# Patient Record
Sex: Female | Born: 1935
Health system: Southern US, Community
[De-identification: ages and names within clinical notes are randomized; demographics above are authoritative.]

## PROBLEM LIST (undated history)

## (undated) DIAGNOSIS — N819 Female genital prolapse, unspecified: Secondary | ICD-10-CM

## (undated) DIAGNOSIS — R3915 Urgency of urination: Secondary | ICD-10-CM

## (undated) DIAGNOSIS — L509 Urticaria, unspecified: Secondary | ICD-10-CM

## (undated) DIAGNOSIS — H409 Unspecified glaucoma: Secondary | ICD-10-CM

## (undated) DIAGNOSIS — I10 Essential (primary) hypertension: Secondary | ICD-10-CM

## (undated) DIAGNOSIS — R3911 Hesitancy of micturition: Secondary | ICD-10-CM

## (undated) DIAGNOSIS — M1990 Unspecified osteoarthritis, unspecified site: Secondary | ICD-10-CM

## (undated) DIAGNOSIS — E785 Hyperlipidemia, unspecified: Secondary | ICD-10-CM

## (undated) HISTORY — DX: Urticaria, unspecified: L50.9

## (undated) HISTORY — PX: RETINAL DETACHMENT SURGERY: SHX105

---

## 1975-08-08 LAB — HM DIABETES EYE EXAM

## 1997-06-20 ENCOUNTER — Other Ambulatory Visit: Admission: RE | Admit: 1997-06-20 | Discharge: 1997-06-20 | Payer: Self-pay | Admitting: *Deleted

## 1998-06-14 ENCOUNTER — Other Ambulatory Visit: Admission: RE | Admit: 1998-06-14 | Discharge: 1998-06-14 | Payer: Self-pay | Admitting: *Deleted

## 1999-07-11 ENCOUNTER — Other Ambulatory Visit: Admission: RE | Admit: 1999-07-11 | Discharge: 1999-07-11 | Payer: Self-pay | Admitting: *Deleted

## 2000-09-23 ENCOUNTER — Other Ambulatory Visit: Admission: RE | Admit: 2000-09-23 | Discharge: 2000-09-23 | Payer: Self-pay | Admitting: *Deleted

## 2003-09-22 ENCOUNTER — Observation Stay (HOSPITAL_COMMUNITY): Admission: EM | Admit: 2003-09-22 | Discharge: 2003-09-23 | Payer: Self-pay | Admitting: Emergency Medicine

## 2004-01-19 ENCOUNTER — Other Ambulatory Visit: Admission: RE | Admit: 2004-01-19 | Discharge: 2004-01-19 | Payer: Self-pay | Admitting: *Deleted

## 2004-03-14 ENCOUNTER — Ambulatory Visit (HOSPITAL_COMMUNITY): Admission: RE | Admit: 2004-03-14 | Discharge: 2004-03-14 | Payer: Self-pay | Admitting: Gastroenterology

## 2006-04-03 ENCOUNTER — Other Ambulatory Visit: Admission: RE | Admit: 2006-04-03 | Discharge: 2006-04-03 | Payer: Self-pay | Admitting: *Deleted

## 2008-03-16 ENCOUNTER — Encounter: Admission: RE | Admit: 2008-03-16 | Discharge: 2008-03-16 | Payer: Self-pay | Admitting: Family Medicine

## 2008-06-08 ENCOUNTER — Other Ambulatory Visit: Admission: RE | Admit: 2008-06-08 | Discharge: 2008-06-08 | Payer: Self-pay | Admitting: Family Medicine

## 2010-06-29 NOTE — Op Note (Signed)
NAME:  JILLIENNE, EGNER             ACCOUNT NO.:  [id number]   MEDICAL RECORD NO.:  [id number]          PATIENT TYPE:  AMB   LOCATION:  ENDO                         FACILITY:  [facility]   PHYSICIAN:  Graylin Shiver, M.D.   DATE OF BIRTH:  Jul 03, 1930   DATE OF PROCEDURE:  03/14/2004  DATE OF DISCHARGE:                                 OPERATIVE REPORT   INDICATIONS:  Screening.   Informed consent was obtained after explanation of the risks of bleeding,  infection and perforation.   PREMEDICATIONS:  Fentanyl 75 mcg IV and Versed 7 mg IV.   DESCRIPTION OF PROCEDURE:  With the patient in the left lateral decubitus  position, a rectal exam was performed.  No masses were felt.  The Olympus  colonoscope was inserted into the rectum and advanced around the colon to  the cecum.  Cecal landmarks were identified.  The cecum and ascending colon  were normal.  The transverse colon was normal.  The descending colon and  sigmoid revealed diverticulosis.  The rectum was normal.  She tolerated the  procedure well without complications.   IMPRESSION:  Diverticulosis of the left colon.      SFG/MEDQ  D:  03/14/2004  T:  03/14/2004  Job:  [id number]   cc:   Al Decant. Janey Greaser, MD  [address]  [place]  [place] [address]  Fax: [phone]

## 2010-06-29 NOTE — H&P (Signed)
NAME:  Emily Robertson, Emily Robertson                       ACCOUNT NO.:  [id number]   MEDICAL RECORD NO.:  [id number]                   PATIENT TYPE:  EMS   LOCATION:  ED                                   FACILITY:  [facility] [address]   PHYSICIAN:  Isla Pence, M.D.             DATE OF BIRTH:  05/28/1930   DATE OF ADMISSION:  09/22/2003  DATE OF DISCHARGE:                                HISTORY & PHYSICAL   IDENTIFICATION STATEMENT:  This is a 77 year old Caucasian female.  Primary  care physician is Dr. Janey Greaser with [facility].   CHIEF COMPLAINT:  Right-sided facial swelling, also tongue and neck  swelling, that came on about 12:30 a.m. on the day of admission.   HISTORY OF PRESENT ILLNESS:  The patient notes that she had just happened to  wake up and felt a little funny on the right side of her face and felt that  it was swollen a little bit.  She took 50 mg of Benadryl orally but did not  notice much of a difference in the swelling, so she decided to come on to  the emergency room at [facility].  She denies any trouble breathing or  swallowing.  There have been no new pills in her regimen.  She has been on  Hyzaar for the past three years without any problems.  She had dinner  consisting of pork ribs with a new set of barbecue sauce that she has not  used in the past before.  She also had some tomatoes and okra.  She had also  eaten some nuts that day consisting of peanuts and almonds.  She has had one  other similar episode six years ago when she had facial swelling at that  time, but since then she has had episodes of lip swelling.  In the past she  used to watch diligently with triggers but had never been able to pick up  any particular triggers.  Six years ago she was also seen in the emergency  room for this.  She has never gone for allergy testing.  In the emergency  room she was given Solu-Medrol 125 mg IV, Benadryl 50 mg IV, and Pepcid 20  mg IV, with marked decrease in her swelling, but the ER  physician felt that  since she was still having some swelling he was uncomfortable with sending  her out of the emergency room.  The patient does note that the swelling is  markedly decreased since coming into the emergency room with the treatments  offered.  She noted previously the facial swelling was the size of a  grapefruit, she said.  She also had a glass of wine with dinner, but she  says she has used this brand of wine before without any problems in the  past.   She says the swelling is certainly markedly down, but she still kind of has  a funny feeling on the  lip.   ALLERGIES:  No known drug allergies.  No known other allergens.   CURRENT MEDICATIONS:  1. Evista 60 mg p.o. daily, which she has been on for a long time.  2. Fish oil.  3. Multivitamin p.o. daily.  4. Hyzaar 50 mg p.o. daily.  5. Glucosamine/chondroitin.  6. Calcium 500 mg p.o. daily.  7. Ecotrin 81 mg p.o. daily.   Once again, none of these medications are new.   PAST MEDICAL HISTORY:  1. Significant only for hypertension.  2. Osteoporosis.  3. Arthritis.  4. Mild hyperlipidemia that she is trying to control with diet and using     fish oil.   PAST SURGICAL HISTORY:  Only for surgery for a detached retina in the right  eye many years ago.   SOCIAL HISTORY:  She has been widowed since 78.  She has two kids.  She is  retired from the [employer].  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  She drinks an occasional glass of wine but  otherwise denies ever using tobacco.   FAMILY HISTORY:  Has a brother who had open heart surgery but no MI.  Oldest  brother died of prostate cancer in his 28s.  A brother with stroke, and it  is the same brother that had the open heart surgery.  There is lymphoma in  the sister.  Diabetes mellitus is negative.  Hypertension is negative.  There is no known colon, breast, ovarian cancer.   REVIEW OF SYSTEMS:  As per HPI.  She otherwise denies  chest pain, abdominal  pain, nausea or vomiting, melena, hematochezia.  She denies any frequency of  urination or dysuria.  She is just a little thirsty since coming into the  emergency room and with all of her current episodes.   PHYSICAL EXAMINATION:  VITAL SIGNS:  She is afebrile.  Blood pressure is  151/81, pulse of 79, respiratory rate 18.  Saturations 100% on room air.  GENERAL:  She is in no apparent distress.  HEENT:  There is a mild amount of right-sided facial swelling in the  mandibular area and going down into the neck.  Oropharynx:  I do not see any  posterior pharynx edema.  Nasal mucosa is just minimally boggy.  NECK:  There is no stridor.  CHEST:  Her lungs are actually clear to auscultation without any crackles or  wheezes.  She is moving good air in all of her lung fields.  CARDIAC:  Regular rate and rhythm.  ABDOMEN:  Bowel sounds are normal, soft and nontender, no organomegaly.  EXTREMITIES:  There is no pretibial edema.  NEUROLOGIC:  There is no gross neurologic deficit.   No lab work was done.   ASSESSMENT AND PLAN:  1. Angioedema.  I doubt very strongly this is related to Hyzaar; in fact, I     am almost certain it is not, although it is always possible it could     create a problem, but I really do believe this has more to do with     dietary intake as a trigger.  We will go ahead and admit her for 24-hour     observation just because she still has some of the swelling and has this     funny feeling on the tongue, but I expect her to go home fairly soon.  We     will do oral prednisone 60 mg x1 at 8 a.m.  and then a tapering dose for a     couple of days.  Will continue the Benadryl and Pepcid also.  I have     recommended allergy testing as an outpatient to see the potential     allergen that is causing her to have such a reaction.  I have also     recommended that she have an EpiPen with her at all times in case the    reaction gets to be more severe.  2. In  regard to a history of hypertension, we will continue the Cozaar.  3. In regard to the of osteoporosis, will continue the Evista.  The rest of     the medications are not so emergent for her to be on during this stay     since it is going to be a very short stay, and she will just continue     those at home, effectively putting her back on her aspirin.                                               Isla Pence, M.D.    RRV/MEDQ  D:  09/22/2003  T:  09/22/2003  Job:  [id number]   cc:   Dr. Jyl Heinz [facility]

## 2010-07-10 ENCOUNTER — Other Ambulatory Visit: Payer: Self-pay | Admitting: Nurse Practitioner

## 2010-07-10 ENCOUNTER — Other Ambulatory Visit (HOSPITAL_COMMUNITY)
Admission: RE | Admit: 2010-07-10 | Discharge: 2010-07-10 | Disposition: A | Discharge status: Home / Self Care | Payer: Medicare Other | Source: Ambulatory Visit | Attending: Obstetrics and Gynecology | Admitting: Obstetrics and Gynecology

## 2010-07-10 DIAGNOSIS — Z124 Encounter for screening for malignant neoplasm of cervix: Secondary | ICD-10-CM | POA: Insufficient documentation

## 2010-07-10 DIAGNOSIS — Z1159 Encounter for screening for other viral diseases: Secondary | ICD-10-CM | POA: Insufficient documentation

## 2012-07-01 ENCOUNTER — Other Ambulatory Visit: Payer: Self-pay | Admitting: Urology

## 2012-08-24 ENCOUNTER — Encounter (HOSPITAL_BASED_OUTPATIENT_CLINIC_OR_DEPARTMENT_OTHER): Payer: Self-pay | Admitting: *Deleted

## 2012-08-24 NOTE — Progress Notes (Addendum)
NPO AFTER MN. ARRIVES AT 0715. NEEDS ISTAT. CURRENT EKG AND LOV NOTE TO BE FAXED FROM DR Verdis Prime. REVEWED [facility] GUIDELINES , WILL BRING MES. WILL DO HIBICLENS AM OF SURG.

## 2012-08-30 NOTE — Anesthesia Preprocedure Evaluation (Addendum)
Anesthesia Evaluation  Patient identified by MRN, date of birth, ID band Patient awake    Reviewed: Allergy & Precautions, H&P , NPO status , Patient's Chart, lab work & pertinent test results, reviewed documented beta blocker date and time   Airway Mallampati: II TM Distance: >3 FB Neck ROM: full    Dental no notable dental hx. (+) Teeth Intact and Dental Advisory Given   Pulmonary neg pulmonary ROS,  breath sounds clear to auscultation  Pulmonary exam normal       Cardiovascular hypertension, Pt. on home beta blockers Rhythm:regular Rate:Normal     Neuro/Psych glaucoma negative neurological ROS  negative psych ROS   GI/Hepatic negative GI ROS, Neg liver ROS,   Endo/Other  negative endocrine ROS  Renal/GU negative Renal ROS  negative genitourinary   Musculoskeletal   Abdominal   Peds  Hematology negative hematology ROS (+)   Anesthesia Other Findings   Reproductive/Obstetrics negative OB ROS                          Anesthesia Physical Anesthesia Plan  ASA: II  Anesthesia Plan: General   Post-op Pain Management:    Induction: Intravenous  Airway Management Planned: LMA  Additional Equipment:   Intra-op Plan:   Post-operative Plan:   Informed Consent: I have reviewed the patients History and Physical, chart, labs and discussed the procedure including the risks, benefits and alternatives for the proposed anesthesia with the patient or authorized representative who has indicated his/her understanding and acceptance.   Dental Advisory Given  Plan Discussed with: CRNA and Surgeon  Anesthesia Plan Comments:         Anesthesia Quick Evaluation

## 2012-08-31 ENCOUNTER — Encounter (HOSPITAL_BASED_OUTPATIENT_CLINIC_OR_DEPARTMENT_OTHER)
Admission: RE | Disposition: A | Discharge status: Home / Self Care | Payer: Self-pay | Source: Ambulatory Visit | Attending: Urology

## 2012-08-31 ENCOUNTER — Encounter (HOSPITAL_BASED_OUTPATIENT_CLINIC_OR_DEPARTMENT_OTHER): Payer: Self-pay | Admitting: Anesthesiology

## 2012-08-31 ENCOUNTER — Ambulatory Visit (HOSPITAL_BASED_OUTPATIENT_CLINIC_OR_DEPARTMENT_OTHER): Payer: Medicare Other | Admitting: Anesthesiology

## 2012-08-31 ENCOUNTER — Ambulatory Visit (HOSPITAL_BASED_OUTPATIENT_CLINIC_OR_DEPARTMENT_OTHER)
Admission: RE | Admit: 2012-08-31 | Discharge: 2012-09-01 | Disposition: A | Discharge status: Home / Self Care | Payer: Medicare Other | Source: Ambulatory Visit | Attending: Urology | Admitting: Urology

## 2012-08-31 DIAGNOSIS — I10 Essential (primary) hypertension: Secondary | ICD-10-CM | POA: Insufficient documentation

## 2012-08-31 DIAGNOSIS — E7800 Pure hypercholesterolemia, unspecified: Secondary | ICD-10-CM | POA: Insufficient documentation

## 2012-08-31 DIAGNOSIS — N952 Postmenopausal atrophic vaginitis: Secondary | ICD-10-CM | POA: Insufficient documentation

## 2012-08-31 DIAGNOSIS — Z79899 Other long term (current) drug therapy: Secondary | ICD-10-CM | POA: Insufficient documentation

## 2012-08-31 DIAGNOSIS — S3140XA Unspecified open wound of vagina and vulva, initial encounter: Secondary | ICD-10-CM | POA: Insufficient documentation

## 2012-08-31 DIAGNOSIS — Y838 Other surgical procedures as the cause of abnormal reaction of the patient, or of later complication, without mention of misadventure at the time of the procedure: Secondary | ICD-10-CM | POA: Insufficient documentation

## 2012-08-31 DIAGNOSIS — H409 Unspecified glaucoma: Secondary | ICD-10-CM | POA: Insufficient documentation

## 2012-08-31 DIAGNOSIS — N8111 Cystocele, midline: Secondary | ICD-10-CM | POA: Insufficient documentation

## 2012-08-31 DIAGNOSIS — N3941 Urge incontinence: Secondary | ICD-10-CM | POA: Insufficient documentation

## 2012-08-31 DIAGNOSIS — N8110 Cystocele, unspecified: Secondary | ICD-10-CM | POA: Insufficient documentation

## 2012-08-31 DIAGNOSIS — Z7982 Long term (current) use of aspirin: Secondary | ICD-10-CM | POA: Insufficient documentation

## 2012-08-31 HISTORY — DX: Female genital prolapse, unspecified: N81.9

## 2012-08-31 HISTORY — DX: Unspecified glaucoma: H40.9

## 2012-08-31 HISTORY — DX: Hesitancy of micturition: R39.11

## 2012-08-31 HISTORY — PX: PUBOVAGINAL SLING: SHX1035

## 2012-08-31 HISTORY — DX: Unspecified osteoarthritis, unspecified site: M19.90

## 2012-08-31 HISTORY — DX: Urgency of urination: R39.15

## 2012-08-31 HISTORY — DX: Essential (primary) hypertension: I10

## 2012-08-31 HISTORY — DX: Hyperlipidemia, unspecified: E78.5

## 2012-08-31 LAB — POCT I-STAT 4, (NA,K, GLUC, HGB,HCT): Hemoglobin: 15.3 g/dL — ABNORMAL HIGH (ref 12.0–15.0)

## 2012-08-31 SURGERY — CREATION, PUBOVAGINAL SLING
Anesthesia: General | Site: Bladder | Wound class: Clean Contaminated

## 2012-08-31 MED ORDER — METRONIDAZOLE 0.75 % VA GEL
VAGINAL | Status: DC | PRN
  Administered 2012-08-31: 1 via VAGINAL

## 2012-08-31 MED ORDER — LIDOCAINE HCL (CARDIAC) 20 MG/ML IV SOLN
INTRAVENOUS | Status: DC | PRN
  Administered 2012-08-31: 50 mg via INTRAVENOUS

## 2012-08-31 MED ORDER — SODIUM CHLORIDE 0.45 % IV SOLN
INTRAVENOUS | Status: DC
  Administered 2012-08-31 – 2012-09-01 (×2): via INTRAVENOUS
  Filled 2012-08-31: qty 1000

## 2012-08-31 MED ORDER — ATENOLOL-CHLORTHALIDONE 50-25 MG PO TABS: 0.5000 | ORAL_TABLET | Freq: Every day | ORAL | Status: DC

## 2012-08-31 MED ORDER — SENNOSIDES-DOCUSATE SODIUM 8.6-50 MG PO TABS
2.0000 | ORAL_TABLET | Freq: Every day | ORAL | Status: DC
  Administered 2012-08-31: 2 via ORAL
  Filled 2012-08-31: qty 2

## 2012-08-31 MED ORDER — BUPIVACAINE-EPINEPHRINE 0.5% -1:200000 IJ SOLN
INTRAMUSCULAR | Status: DC | PRN
  Administered 2012-08-31: 30 mL

## 2012-08-31 MED ORDER — ACETAMINOPHEN 10 MG/ML IV SOLN
INTRAVENOUS | Status: DC | PRN
  Administered 2012-08-31: 1000 mg via INTRAVENOUS

## 2012-08-31 MED ORDER — CEFAZOLIN SODIUM-DEXTROSE 2-3 GM-% IV SOLR
2.0000 g | INTRAVENOUS | Status: AC
  Administered 2012-08-31: 2 g via INTRAVENOUS
  Filled 2012-08-31: qty 50

## 2012-08-31 MED ORDER — LORATADINE 10 MG PO CAPS: 1.0000 | ORAL_CAPSULE | Freq: Every day | ORAL | Status: DC

## 2012-08-31 MED ORDER — DEXAMETHASONE SODIUM PHOSPHATE 4 MG/ML IJ SOLN
INTRAMUSCULAR | Status: DC | PRN
  Administered 2012-08-31: 10 mg via INTRAVENOUS

## 2012-08-31 MED ORDER — OMEGA-3 FATTY ACIDS 1000 MG PO CAPS: 1.0000 g | ORAL_CAPSULE | Freq: Every day | ORAL | Status: DC

## 2012-08-31 MED ORDER — TRAMADOL-ACETAMINOPHEN 37.5-325 MG PO TABS: 1.0000 | ORAL_TABLET | Freq: Four times a day (QID) | ORAL | Status: DC | PRN

## 2012-08-31 MED ORDER — VITAMIN D3 25 MCG (1000 UT) PO CAPS: 1.0000 | ORAL_CAPSULE | Freq: Every day | ORAL | Status: DC

## 2012-08-31 MED ORDER — HYDROMORPHONE HCL PF 1 MG/ML IJ SOLN
0.5000 mg | INTRAMUSCULAR | Status: DC | PRN
Start: 2012-08-31 — End: 2012-09-01
  Filled 2012-08-31: qty 1

## 2012-08-31 MED ORDER — FENTANYL CITRATE 0.05 MG/ML IJ SOLN
INTRAMUSCULAR | Status: DC | PRN
  Administered 2012-08-31 (×2): 50 ug via INTRAVENOUS
  Administered 2012-08-31: 25 ug via INTRAVENOUS

## 2012-08-31 MED ORDER — TIMOLOL HEMIHYDRATE 0.25 % OP SOLN: 1.0000 [drp] | Freq: Two times a day (BID) | OPHTHALMIC | Status: DC

## 2012-08-31 MED ORDER — EPHEDRINE SULFATE 50 MG/ML IJ SOLN
INTRAMUSCULAR | Status: DC | PRN
  Administered 2012-08-31: 15 mg via INTRAVENOUS
  Administered 2012-08-31: 5 mg via INTRAVENOUS
  Administered 2012-08-31 (×2): 10 mg via INTRAVENOUS

## 2012-08-31 MED ORDER — BELLADONNA ALKALOIDS-OPIUM 16.2-60 MG RE SUPP
RECTAL | Status: DC | PRN
Start: 2012-08-31 — End: 2012-08-31
  Administered 2012-08-31: 1 via RECTAL

## 2012-08-31 MED ORDER — CALCIUM CARB-CHOLECALCIFEROL 500-400 MG-UNIT PO TABS: ORAL_TABLET | ORAL | Status: DC

## 2012-08-31 MED ORDER — CIPROFLOXACIN HCL 500 MG PO TABS: 500.0000 mg | ORAL_TABLET | Freq: Two times a day (BID) | ORAL | Status: DC

## 2012-08-31 MED ORDER — INDIGOTINDISULFONATE SODIUM 8 MG/ML IJ SOLN
INTRAMUSCULAR | Status: DC | PRN
  Administered 2012-08-31 (×2): 5 mL via INTRAVENOUS

## 2012-08-31 MED ORDER — DIPHENHYDRAMINE HCL 12.5 MG/5ML PO ELIX
12.5000 mg | ORAL_SOLUTION | Freq: Four times a day (QID) | ORAL | Status: DC | PRN
  Filled 2012-08-31: qty 5

## 2012-08-31 MED ORDER — SODIUM CHLORIDE 0.9 % IJ SOLN
INTRAMUSCULAR | Status: DC | PRN
  Administered 2012-08-31: 50 mL via INTRAVENOUS

## 2012-08-31 MED ORDER — LACTATED RINGERS IV SOLN
INTRAVENOUS | Status: DC
  Filled 2012-08-31: qty 1000

## 2012-08-31 MED ORDER — CIPROFLOXACIN HCL 500 MG PO TABS
500.0000 mg | ORAL_TABLET | Freq: Two times a day (BID) | ORAL | Status: DC
  Administered 2012-08-31 – 2012-09-01 (×2): 500 mg via ORAL
  Filled 2012-08-31: qty 1

## 2012-08-31 MED ORDER — GLUCOSAMINE-CHONDROITIN 500-400 MG PO TABS: 1.0000 | ORAL_TABLET | Freq: Every day | ORAL | Status: DC

## 2012-08-31 MED ORDER — ONDANSETRON HCL 4 MG/2ML IJ SOLN
INTRAMUSCULAR | Status: DC | PRN
  Administered 2012-08-31: 4 mg via INTRAVENOUS

## 2012-08-31 MED ORDER — LACTATED RINGERS IV SOLN
INTRAVENOUS | Status: DC
  Administered 2012-08-31 (×2): via INTRAVENOUS
  Filled 2012-08-31: qty 1000

## 2012-08-31 MED ORDER — HYDROCODONE-ACETAMINOPHEN 5-325 MG PO TABS
1.0000 | ORAL_TABLET | ORAL | Status: DC | PRN
  Filled 2012-08-31: qty 2

## 2012-08-31 MED ORDER — BISACODYL 10 MG RE SUPP
10.0000 mg | Freq: Every day | RECTAL | Status: DC | PRN
  Filled 2012-08-31: qty 1

## 2012-08-31 MED ORDER — GLYCOPYRROLATE 0.2 MG/ML IJ SOLN
INTRAMUSCULAR | Status: DC | PRN
  Administered 2012-08-31: 0.2 mg via INTRAVENOUS

## 2012-08-31 MED ORDER — ZOLPIDEM TARTRATE 5 MG PO TABS
5.0000 mg | ORAL_TABLET | Freq: Every evening | ORAL | Status: DC | PRN
  Filled 2012-08-31: qty 1

## 2012-08-31 MED ORDER — KETOROLAC TROMETHAMINE 30 MG/ML IJ SOLN
INTRAMUSCULAR | Status: DC | PRN
  Administered 2012-08-31: 15 mg via INTRAVENOUS

## 2012-08-31 MED ORDER — CHLORHEXIDINE GLUCONATE 4 % EX LIQD
Freq: Once | CUTANEOUS | Status: DC
  Filled 2012-08-31: qty 15

## 2012-08-31 MED ORDER — FENTANYL CITRATE 0.05 MG/ML IJ SOLN
25.0000 ug | INTRAMUSCULAR | Status: DC | PRN
  Filled 2012-08-31: qty 1

## 2012-08-31 MED ORDER — CEFAZOLIN SODIUM 1-5 GM-% IV SOLN
1.0000 g | INTRAVENOUS | Status: DC
  Filled 2012-08-31: qty 50

## 2012-08-31 MED ORDER — ONE-DAILY MULTI VITAMINS PO TABS: 1.0000 | ORAL_TABLET | Freq: Every day | ORAL | Status: DC

## 2012-08-31 MED ORDER — SODIUM CHLORIDE 0.9 % IR SOLN
Status: DC | PRN
  Administered 2012-08-31: 10:00:00

## 2012-08-31 MED ORDER — PROPOFOL 10 MG/ML IV BOLUS
INTRAVENOUS | Status: DC | PRN
  Administered 2012-08-31: 20 mg via INTRAVENOUS
  Administered 2012-08-31: 130 mg via INTRAVENOUS

## 2012-08-31 MED ORDER — DIPHENHYDRAMINE HCL 50 MG/ML IJ SOLN
12.5000 mg | Freq: Four times a day (QID) | INTRAMUSCULAR | Status: DC | PRN
  Filled 2012-08-31: qty 0.25

## 2012-08-31 SURGICAL SUPPLY — 56 items
BAG URINE DRAINAGE (UROLOGICAL SUPPLIES) ×2 IMPLANT
BLADE SURG 10 STRL SS (BLADE) ×2 IMPLANT
BLADE SURG 15 STRL LF DISP TIS (BLADE) ×1 IMPLANT
BLADE SURG 15 STRL SS (BLADE) ×1
BLADE SURG ROTATE 9660 (MISCELLANEOUS) ×2 IMPLANT
BOOTIES KNEE HIGH SLOAN (MISCELLANEOUS) ×2 IMPLANT
CANISTER SUCTION 1200CC (MISCELLANEOUS) IMPLANT
CANISTER SUCTION 2500CC (MISCELLANEOUS) ×4 IMPLANT
CATH FOLEY 2WAY SLVR  5CC 16FR (CATHETERS) ×1
CATH FOLEY 2WAY SLVR 5CC 16FR (CATHETERS) ×1 IMPLANT
CLOTH BEACON ORANGE TIMEOUT ST (SAFETY) ×2 IMPLANT
COVER MAYO STAND STRL (DRAPES) ×2 IMPLANT
COVER TABLE BACK 60X90 (DRAPES) ×2 IMPLANT
DERMABOND ADVANCED (GAUZE/BANDAGES/DRESSINGS) ×1
DERMABOND ADVANCED .7 DNX12 (GAUZE/BANDAGES/DRESSINGS) ×1 IMPLANT
DEVICE CAPIO SLIM BOX (INSTRUMENTS) IMPLANT
DISSECTOR ROUND CHERRY 3/8 STR (MISCELLANEOUS) IMPLANT
DRAPE CAMERA CLOSED 9X96 (DRAPES) ×2 IMPLANT
DRAPE UNDERBUTTOCKS STRL (DRAPE) ×2 IMPLANT
FLOSEAL 10ML (HEMOSTASIS) IMPLANT
GAUZE SPONGE 4X4 16PLY XRAY LF (GAUZE/BANDAGES/DRESSINGS) IMPLANT
GLOVE BIO SURGEON STRL SZ7.5 (GLOVE) ×4 IMPLANT
GOWN PREVENTION PLUS LG XLONG (DISPOSABLE) ×2 IMPLANT
GOWN STRL REIN XL XLG (GOWN DISPOSABLE) ×2 IMPLANT
NEEDLE 1/2 CIR CATGUT .05X1.09 (NEEDLE) IMPLANT
NEEDLE HYPO 22GX1.5 SAFETY (NEEDLE) ×4 IMPLANT
PACKING VAGINAL (PACKING) ×2 IMPLANT
PENCIL BUTTON HOLSTER BLD 10FT (ELECTRODE) ×2 IMPLANT
PLUG CATH AND CAP STER (CATHETERS) ×2 IMPLANT
RETRACTOR LONRSTAR 16.6X16.6CM (MISCELLANEOUS) ×1 IMPLANT
RETRACTOR STAY HOOK 5MM (MISCELLANEOUS) ×2 IMPLANT
RETRACTOR STER APS 16.6X16.6CM (MISCELLANEOUS) ×2
SET IRRIG Y TYPE TUR BLADDER L (SET/KITS/TRAYS/PACK) ×2 IMPLANT
SHEET LAVH (DRAPES) ×2 IMPLANT
SLING SOLYX SYSTEM SIS BX (SLING) IMPLANT
SPONGE LAP 4X18 X RAY DECT (DISPOSABLE) ×2 IMPLANT
SUCTION FRAZIER TIP 10 FR DISP (SUCTIONS) ×2 IMPLANT
SUT ABS MONO DBL WITH NDL 48IN (SUTURE) IMPLANT
SUT ETHILON 2 0 PS N (SUTURE) IMPLANT
SUT MON AB 2-0 SH 27 (SUTURE)
SUT MON AB 2-0 SH27 (SUTURE) IMPLANT
SUT NONABSORB MONO DB W/NDL 48 (SUTURE) IMPLANT
SUT PDS AB 3-0 SH 27 (SUTURE) IMPLANT
SUT SILK 3 0 PS 1 (SUTURE) ×2 IMPLANT
SUT VIC AB 0 CT1 36 (SUTURE) IMPLANT
SUT VIC AB 2-0 CT1 27 (SUTURE)
SUT VIC AB 2-0 CT1 TAPERPNT 27 (SUTURE) IMPLANT
SUT VIC AB 2-0 UR6 27 (SUTURE) ×8 IMPLANT
SYR BULB IRRIGATION 50ML (SYRINGE) ×2 IMPLANT
SYR CONTROL 10ML LL (SYRINGE) ×2 IMPLANT
SYRINGE 10CC LL (SYRINGE) ×2 IMPLANT
SYS PRF KIT VAG SUP UPHOLD (Sling) ×2 IMPLANT
TRAY DSU PREP LF (CUSTOM PROCEDURE TRAY) ×2 IMPLANT
TUBE CONNECTING 12X1/4 (SUCTIONS) ×4 IMPLANT
WATER STERILE IRR 500ML POUR (IV SOLUTION) ×4 IMPLANT
YANKAUER SUCT BULB TIP NO VENT (SUCTIONS) IMPLANT

## 2012-08-31 NOTE — H&P (Signed)
Active Problems Problems  1. Cystocele 596.89 2. Postmenopausal Atrophic Vaginitis 627.3 3. Urge Incontinence Of Urine 788.31 4. Vaginal Wall Prolapse 618.00  History of Present Illness    Emily Robertson is an 79 year old female, with glaucoma, and pelvic floor prolapse. She has been treated with a pessary, but complains of back pain, and is unable to keep the pessary in place. She has had recurrent bladder infections. She doesn't want to keep the pessary in place anymore. She mows her own yard, keep her garden, and does her housework. The patient has a history of a grade 3 cystocele. She has been using estrogen cream more regularly.    There is no constipation. There is urge incontinence in the morning when her bladder is full. There is no history of cough, laugh, or sneeze incontinence. No history of tobacco use. She is para 2-2-zero vaginal deliveries only. No known tears.  Video urodynamics on 06/23/12, shows maximal cystometric capacity of 579 cc, with first sensation at 150 cc, normal desire at 189 cc and strong desire at 541 cc. The patient has an unstable bladder contraction pressure of 9 cm of water, with no urinary leakage. She is able to inhibit this unstable contraction. The patient did not leak for Valsalva leak point pressure. Pressure flow study was accomplished, and shows a void time of 570 cc with maximum flow rate of 30 cc per second with a detrusor pressure at maximum flow of 8 cm water. PVR scant. The patient is able to void with a vaginal packing in place. There is no incontinence with vaginal packing in place.  Emily Robertson has a bladder capacity of 579 cc with mild instability. She is able to inhibit this contraction, without leakage. There is no stress incontinence noted with or without reduction of the prolapse. She is able to generate a voluntary contraction, and voids to completion. She empties efficiently. She will need sacrospinous anterior vault suspension.   Past Medical  History Problems  1. History of  Glaucoma 365.9 2. History of  Gout 274.9 3. History of  Hypercholesterolemia 272.0 4. History of  Hypertension 401.9 5. History of  Hyponatremia 276.1  Surgical History Problems  1. History of  Cataract Surgery Left 2. History of  Cataract Surgery Right 3. History of  Repair Of Retinal Detachment Right  Current Meds 1. Alendronate Sodium 70 MG Oral Tablet; Therapy: (Recorded:03Apr2013) to 2. Aspirin 81 MG Oral Tablet; Therapy: (Recorded:03Apr2013) to 3. Atenolol-Chlorthalidone 50-25 MG Oral Tablet; Therapy: (Recorded:03Apr2013) to 4. Calcium TABS; Therapy: (Recorded:03Apr2013) to 5. Estrace 0.1 MG/GM Vaginal Cream; 1/2 applicatorful at bedtime 3 times weekly; Therapy:  03Apr2013 to (Last Rx:03Apr2013)  Requested for: 03Apr2013 6. Fish Oil CAPS; Therapy: (Recorded:03Apr2013) to 7. Glucosamine Chondr 1500 Complx Oral Capsule; Therapy: (Recorded:03Apr2013) to 8. Loratadine 10 MG Oral Tablet; Therapy: (Recorded:03Apr2013) to 9. Multi-Vitamin TABS; Therapy: (Recorded:03Apr2013) to 10. Probiotic CAPS; Therapy: (Recorded:03Apr2013) to 11. Timolol Maleate SOLN; Therapy: (Recorded:03Apr2013) to 12. Vitamin D3 CAPS; Therapy: (Recorded:03Apr2013) to  Allergies Medication  1. Erythromycin Base TABS  Family History Problems  1. Paternal history of  Acute Myocardial Infarction V17.3 2. Family history of  Death In The Family Father 3. Family history of  Death In The Family Mother 4. Family history of  Family Health Status Number Of Children 1 son 1 daughter 5. Fraternal history of  Nephrolithiasis 6. Fraternal history of  Prostate Cancer V16.42 7. Maternal history of  Stroke Syndrome V17.1  Social History Problems  1. Caffeine Use 2-3 coffee or tea per  day 2. Marital History - Widowed 3. Never A Smoker 4. Retired From Work Denied  5. History of  Alcohol Use  Review of Systems Genitourinary, constitutional, skin, eye, otolaryngeal,  hematologic/lymphatic, cardiovascular, pulmonary, endocrine, musculoskeletal, gastrointestinal, neurological and psychiatric system(s) were reviewed and pertinent findings if present are noted.  Genitourinary: incontinence, pelvic pressure and vaginal lump or mass.    Vitals Vital Signs [Data Includes: Last 1 Day]  20May2014 01:20PM  Blood Pressure: 105 / 59 Temperature: 97 F Heart Rate: 55  Physical Exam Constitutional: Well nourished and well developed . No acute distress.  ENT:. The ears and nose are normal in appearance.  Neck: The appearance of the neck is normal and no neck mass is present.  Pulmonary: No respiratory distress.  Cardiovascular:. No peripheral edema.  Abdomen: The abdomen is rounded. The abdomen is soft and nontender. No masses are palpated. No CVA tenderness. No hernias are palpable. No hepatosplenomegaly noted.  Genitourinary:. Examined in supine/lithotomy and standing positions.  Chaperone Present: kim lewis.  Examination of the external genitalia shows no labial adhesions. The urethra is urethral caruncle, but normal in appearance, not tender and does not appear stenotic. There is no urethral mass. Urethral hypermobility is not present. There is no urethral discharge. No periurethral cyst is identified. There is no urethral prolapse. Vaginal exam demonstrates atrophy and the vaginal epithelium to be poorly estrogenized, but no discharge, no tenderness, no uterine prolapse and no mass. A cystocele is present with a midline defect (grade 3-4 /4). No enterocele is identified. No rectocele is identified. The cervix is is without abnormalities. The uterus is without abnormalities and non tender. The adnexa are palpably normal. The bladder is normal on palpation, non tender, not distended and without masses. The anus is normal on inspection. The perineum is normal on inspection.  Lymphatics: The femoral and inguinal nodes are not enlarged or tender.  Skin: Normal skin turgor,  no visible rash and no visible skin lesions.  Neuro/Psych:. Mood and affect are appropriate.    Results/Data Urine [Data Includes: Last 1 Day]   20May2014  COLOR YELLOW   APPEARANCE CLEAR   SPECIFIC GRAVITY 1.015   pH 7.0   GLUCOSE NEG mg/dL  BILIRUBIN NEG   KETONE NEG mg/dL  BLOOD NEG   PROTEIN NEG mg/dL  UROBILINOGEN 0.2 mg/dL  NITRITE NEG   LEUKOCYTE ESTERASE SMALL   SQUAMOUS EPITHELIAL/HPF RARE   WBC 0-2 WBC/hpf  RBC 0-2 RBC/hpf  BACTERIA RARE   CRYSTALS NONE SEEN   CASTS NONE SEEN    Assessment Assessed  1. Vaginal Wall Prolapse 618.00 2. Postmenopausal Atrophic Vaginitis 627.3 3. Cystocele 60.89   79 yo female with Stage III anterior bvault pelvic floor prolapse. No stress incontinence comnponent. She has stable bladder on videourodynamics, and doesnot leak, even with packing in place. She has not had hysterectomy,m and therefore does not qualify for the mesh study.    We have discussed alternatives for repair of her anatomy problem, sn dshe understands that she could haved simple cystocele repair, but with a 50% chance of recurrence; or with biologic augmentation-with reduction of recurrence rate to 20%. She could have mesh placed with a reduction to 3% ( my data). We have discussed Uphold Lite mesh, and the adviertisements of TV. She is most concerned with use of biologic tissue, and , even though she would be uing a piedce of tissue grown in tissue culture, she would prefer to use mesh instead.  Therefore, we will proceed with Lia Hopping  Lite sacrospinus repair at [facility], overnight stay. I have not demonstrated any stredss component, even with packing of her cystocele, and therefore have not reccommended co-incidental sling. However, she understands that , if she develops stress incontinence in the future, she could have s Solyx sling ( min-urethral) as an outpatient.  Of note is her Glaucoma-and we have discussed the problems with use of anticholinergics with her in the future.  If necessary, would use Myrbetriq.   Plan Health Maintenance (V70.0)  1. UA With REFLEX  Done: 20May2014 01:02PM   Schedule Surgical intervention.   Signatures Electronically signed by : Jethro Bolus, M.D.; Jun 30 2012  5:59PM

## 2012-08-31 NOTE — Anesthesia Procedure Notes (Signed)
Procedure Name: LMA Insertion Date/Time: 08/31/2012 8:53 AM Performed by: Norva Pavlov Pre-anesthesia Checklist: Patient identified, Emergency Drugs available, Suction available and Patient being monitored Patient Re-evaluated:Patient Re-evaluated prior to inductionOxygen Delivery Method: Circle System Utilized Preoxygenation: Pre-oxygenation with 100% oxygen Intubation Type: IV induction Ventilation: Mask ventilation without difficulty LMA: LMA inserted LMA Size: 4.0 Number of attempts: 1 Airway Equipment and Method: bite block Placement Confirmation: positive ETCO2 Tube secured with: Tape Dental Injury: Teeth and Oropharynx as per pre-operative assessment

## 2012-08-31 NOTE — Transfer of Care (Signed)
Immediate Anesthesia Transfer of Care Note  Patient: Emily Robertson  Procedure(s) Performed: Procedure(s) (LRB): BOSTON SCIENTIFIC UPHOLD LIGHT SACROSPINUS REPAIR (N/A)  Patient Location: PACU  Anesthesia Type: General  Level of Consciousness: awake, alert  and oriented  Airway & Oxygen Therapy: Patient Spontanous Breathing and Patient connected to face mask oxygen  Post-op Assessment: Report given to PACU RN and Post -op Vital signs reviewed and stable  Post vital signs: Reviewed and stable  Complications: No apparent anesthesia complications

## 2012-08-31 NOTE — Anesthesia Postprocedure Evaluation (Signed)
  Anesthesia Post-op Note  Patient: Emily Robertson  Procedure(s) Performed: Procedure(s) (LRB): BOSTON SCIENTIFIC UPHOLD LIGHT SACROSPINUS REPAIR (N/A)  Patient Location: PACU  Anesthesia Type: General  Level of Consciousness: awake and alert   Airway and Oxygen Therapy: Patient Spontanous Breathing  Post-op Pain: mild  Post-op Assessment: Post-op Vital signs reviewed, Patient's Cardiovascular Status Stable, Respiratory Function Stable, Patent Airway and No signs of Nausea or vomiting  Last Vitals:  Filed Vitals:   08/31/12 1115  BP: 133/73  Pulse: 89  Temp:   Resp: 22    Post-op Vital Signs: stable   Complications: No apparent anesthesia complications

## 2012-08-31 NOTE — Op Note (Signed)
Pre-operative diagnosis :   Stage III anterior vault pelvic floor prolapse  Postoperative diagnosis:  Same  Operation: [employer] sacrospinous mesh fixation anterior vault repair with Emily Robertson plication    Surgeon:  S. Patsi Sears, MD  First assistant:  None  Anesthesia:  General LMA   Preparation:  After appropriate preanesthesia, the patient was brought the operating room, placed on the operating table in the dorsal supine position where general LMA anesthesia was introduced. She was then replaced in the dorsal lithotomy position with pubis was prepped with Betadine solution and draped in usual fashion. Armband was double checked. History was reviewed.   Review history:  Emily Robertson is an 79 year old female, with glaucoma, and pelvic floor prolapse. She has been treated with a pessary, but complains of back pain, and is unable to keep the pessary in place. She has had recurrent bladder infections. She doesn't want to keep the pessary in place anymore. She mows her own yard, keep her garden, and does her housework. The patient has a history of a grade 3 cystocele. She has been using estrogen cream more regularly.  There is no constipation. There is urge incontinence in the morning when her bladder is full. There is no history of cough, laugh, or sneeze incontinence. No history of tobacco use. She is para 2-2-zero vaginal deliveries only. No known tears.  Video urodynamics on 06/23/12, shows maximal cystometric capacity of 579 cc, with first sensation at 150 cc, normal desire at 189 cc and strong desire at 541 cc. The patient has an unstable bladder contraction pressure of 9 cm of water, with no urinary leakage. She is able to inhibit this unstable contraction. The patient did not leak for Valsalva leak point pressure. Pressure flow study was accomplished, and shows a void time of 570 cc with maximum flow rate of 30 cc per second with a detrusor pressure at maximum flow of 8 cm water. PVR  scant. The patient is able to void with a vaginal packing in place. There is no incontinence with vaginal packing in place.  Emily Robertson has a bladder capacity of 579 cc with mild instability. She is able to inhibit this contraction, without leakage. There is no stress incontinence noted with or without reduction of the prolapse. She is able to generate a voluntary contraction, and voids to completion. She empties efficiently. She will need sacrospinous anterior vault suspension.    Statement of  Likelihood of Success: Excellent. TIME-OUT observed.:  Procedure:  Examination of the vagina revealed a stage III anterior vault prolapse. There was no urethral hypermobility. There was no enterocele or rectocele noted. The vaginal mucosa was postmenopausal, and send. The horseshoe retractor was placed. Foley catheter was placed. Posterior speculum was placed with wet sponge as protection needed. The large anterior vault prolapse was identified at the level of the introitus. The junction between the urethra and vagina was identified. The cervix was identified.  Using a marking pen, a horizontal mark was accomplished 2 cm proximal to the urethral vesical junction and the vagina. The patient then underwent injection of 60 cc of Marcaine and epinephrine solution, 0.25% +1 200,000 epinephrine. This allowed for [facility] dissection, as well as long-term local anesthetic. In addition, this allowed for decreased vaginal bleeding.  Horizontal incision is then accomplished, measuring 5 cm. Subcutaneous tissue is dissected proximally and distally with blunt and sharp dissection. Dissection is carried into the pelvic floor. The ischial spines are identified bilaterally. The sacrospinous ligaments are identified bilaterally.  a small Kelly plication is accomplished, using 2-0 Vicryl horizontal mattress sutures. Following this, and using the new [employer] device, the uphold light mesh anterior vault system is placed into the  sacrospinous ligaments bilaterally by feeling the ischial spines and placing the sutures one finger breath medial to the ischial spines within the sacrospinous ligament. No bleeding was noted. The mesh was brought through the ligament, and the broad portion of mesh was placed against the bladder. This was smooth. It was tacked in place with 3 sutures proximalward, and 3 sutures distalward, all of 2-0 Vicryl suture. Antibiotic irrigation was accomplished. The wings of the mesh were then rocked across the vagina in standard fashion, lifting the bladder sufficiently. The wings were then cut in standard fashion, by cutting the suture within the plastic, and removing the plastic sleeve, and the excess wings. The mesh was smooth against the vaginal floor smooth against the bladder. The vaginal edges were freshened, and closure was accomplished using 2 separate 2-0 Vicryl running sutures in the vagina. A small vaginal laceration was identified from the retractor, and this was sutured with a 4-0 Monocryl suture with a simple suture. Following this, cystoscopies accomplished after indigocarmine was given, and jets of blue dye were seen from both ureteral orifices. Foley catheter was replaced. Vaginal packing was placed. Following this, the patient was given IV Toradol, awakened and taken to recovery room in good condition. She tolerated procedure well. She was also given IV Tylenol at the beginning of the procedure.

## 2012-08-31 NOTE — Interval H&P Note (Signed)
History and Physical Interval Note:  08/31/2012 10:33 AM  Emily Robertson  has presented today for surgery, with the diagnosis of anterior pelvic floor prolapse  The various methods of treatment have been discussed with the patient and family. After consideration of risks, benefits and other options for treatment, the patient has consented to  Procedure(s): [employer] UPHOLD LIGHT SACROSPINUS REPAIR (N/A) as a surgical intervention .  The patient's history has been reviewed, patient examined, no change in status, stable for surgery.  I have reviewed the patient's chart and labs.  Questions were answered to the patient's satisfaction.     Jethro Bolus I

## 2012-09-01 ENCOUNTER — Encounter (HOSPITAL_BASED_OUTPATIENT_CLINIC_OR_DEPARTMENT_OTHER): Payer: Self-pay | Admitting: Urology

## 2012-09-01 NOTE — Progress Notes (Signed)
Dr. Patsi Sears informed of the results of the bladder scan.  Ok for the patient to go home.

## 2012-11-29 ENCOUNTER — Emergency Department (HOSPITAL_COMMUNITY): Payer: Medicare Other

## 2012-11-29 ENCOUNTER — Inpatient Hospital Stay (HOSPITAL_COMMUNITY): Payer: Medicare Other

## 2012-11-29 ENCOUNTER — Inpatient Hospital Stay (HOSPITAL_COMMUNITY)
Admission: EM | Admit: 2012-11-29 | Discharge: 2012-12-01 | DRG: 915 | Disposition: A | Discharge status: Home / Self Care | Payer: Medicare Other | Attending: Emergency Medicine | Admitting: Emergency Medicine

## 2012-11-29 ENCOUNTER — Encounter (HOSPITAL_COMMUNITY): Payer: Self-pay | Admitting: Emergency Medicine

## 2012-11-29 DIAGNOSIS — T148 Other injury of unspecified body region: Secondary | ICD-10-CM | POA: Diagnosis present

## 2012-11-29 DIAGNOSIS — H409 Unspecified glaucoma: Secondary | ICD-10-CM | POA: Diagnosis present

## 2012-11-29 DIAGNOSIS — J988 Other specified respiratory disorders: Secondary | ICD-10-CM

## 2012-11-29 DIAGNOSIS — I10 Essential (primary) hypertension: Secondary | ICD-10-CM | POA: Diagnosis present

## 2012-11-29 DIAGNOSIS — T783XXS Angioneurotic edema, sequela: Secondary | ICD-10-CM

## 2012-11-29 DIAGNOSIS — Z7982 Long term (current) use of aspirin: Secondary | ICD-10-CM

## 2012-11-29 DIAGNOSIS — M129 Arthropathy, unspecified: Secondary | ICD-10-CM | POA: Diagnosis present

## 2012-11-29 DIAGNOSIS — T783XXA Angioneurotic edema, initial encounter: Principal | ICD-10-CM

## 2012-11-29 DIAGNOSIS — T783XXD Angioneurotic edema, subsequent encounter: Secondary | ICD-10-CM

## 2012-11-29 DIAGNOSIS — W57XXXA Bitten or stung by nonvenomous insect and other nonvenomous arthropods, initial encounter: Secondary | ICD-10-CM | POA: Diagnosis present

## 2012-11-29 DIAGNOSIS — Z79899 Other long term (current) drug therapy: Secondary | ICD-10-CM

## 2012-11-29 DIAGNOSIS — J9600 Acute respiratory failure, unspecified whether with hypoxia or hypercapnia: Secondary | ICD-10-CM | POA: Diagnosis present

## 2012-11-29 DIAGNOSIS — T7840XA Allergy, unspecified, initial encounter: Secondary | ICD-10-CM

## 2012-11-29 DIAGNOSIS — E785 Hyperlipidemia, unspecified: Secondary | ICD-10-CM | POA: Diagnosis present

## 2012-11-29 LAB — CBC WITH DIFFERENTIAL/PLATELET
Basophils Absolute: 0 10*3/uL (ref 0.0–0.1)
Basophils Relative: 0 % (ref 0–1)
Eosinophils Absolute: 0.2 10*3/uL (ref 0.0–0.7)
Eosinophils Relative: 1 % (ref 0–5)
MCH: 32 pg (ref 26.0–34.0)
MCHC: 36 g/dL (ref 30.0–36.0)
MCV: 88.7 fL (ref 78.0–100.0)
Neutro Abs: 12.6 10*3/uL — ABNORMAL HIGH (ref 1.7–7.7)
Platelets: 311 10*3/uL (ref 150–400)
RBC: 4.35 MIL/uL (ref 3.87–5.11)
RDW: 12.4 % (ref 11.5–15.5)
WBC: 15.2 10*3/uL — ABNORMAL HIGH (ref 4.0–10.5)

## 2012-11-29 LAB — BLOOD GAS, ARTERIAL
Acid-base deficit: 0.1 mmol/L (ref 0.0–2.0)
MECHVT: 450 mL
O2 Saturation: 98.6 %
PEEP: 5 cmH2O
Patient temperature: 98.6
TCO2: 19.4 mmol/L (ref 0–100)
pH, Arterial: 7.487 — ABNORMAL HIGH (ref 7.350–7.450)
pO2, Arterial: 152 mmHg — ABNORMAL HIGH (ref 80.0–100.0)

## 2012-11-29 LAB — COMPREHENSIVE METABOLIC PANEL
ALT: 26 U/L (ref 0–35)
AST: 38 U/L — ABNORMAL HIGH (ref 0–37)
BUN: 25 mg/dL — ABNORMAL HIGH (ref 6–23)
CO2: 23 mEq/L (ref 19–32)
Calcium: 9.6 mg/dL (ref 8.4–10.5)
GFR calc Af Amer: >90 mL/min (ref >90)
Potassium: 3.2 mEq/L — ABNORMAL LOW (ref 3.5–5.1)
Sodium: 126 mEq/L — ABNORMAL LOW (ref 135–145)
Total Protein: 6.9 g/dL (ref 6.0–8.3)

## 2012-11-29 LAB — GLUCOSE, CAPILLARY
Glucose-Capillary: 137 mg/dL — ABNORMAL HIGH (ref 70–99)
Glucose-Capillary: 116 mg/dL — ABNORMAL HIGH (ref 70–99)

## 2012-11-29 MED ORDER — PROPOFOL 10 MG/ML IV EMUL
INTRAVENOUS | Status: AC
  Filled 2012-11-29: qty 100

## 2012-11-29 MED ORDER — DIPHENHYDRAMINE HCL 50 MG/ML IJ SOLN
50.0000 mg | Freq: Once | INTRAMUSCULAR | Status: AC
  Administered 2012-11-29: 50 mg via INTRAVENOUS
  Filled 2012-11-29: qty 1

## 2012-11-29 MED ORDER — FENTANYL CITRATE 0.05 MG/ML IJ SOLN
25.0000 ug | INTRAMUSCULAR | Status: DC | PRN
  Administered 2012-11-29: 25 ug via INTRAVENOUS
  Filled 2012-11-29: qty 2

## 2012-11-29 MED ORDER — PROPOFOL 10 MG/ML IV EMUL
5.0000 ug/kg/min | INTRAVENOUS | Status: DC
  Administered 2012-11-29: 15 ug/kg/min via INTRAVENOUS
  Administered 2012-11-29: 10 ug/kg/min via INTRAVENOUS
  Administered 2012-11-30: 20 ug/kg/min via INTRAVENOUS
  Filled 2012-11-29 (×2): qty 100

## 2012-11-29 MED ORDER — BRINZOLAMIDE 1 % OP SUSP
1.0000 [drp] | Freq: Two times a day (BID) | OPHTHALMIC | Status: DC
  Filled 2012-11-29: qty 10

## 2012-11-29 MED ORDER — SUCCINYLCHOLINE CHLORIDE 20 MG/ML IJ SOLN
INTRAMUSCULAR | Status: AC | PRN
  Administered 2012-11-29: 100 mg via INTRAVENOUS

## 2012-11-29 MED ORDER — METHYLPREDNISOLONE SODIUM SUCC 125 MG IJ SOLR
60.0000 mg | Freq: Four times a day (QID) | INTRAMUSCULAR | Status: DC
  Administered 2012-11-29 – 2012-11-30 (×4): 60 mg via INTRAVENOUS
  Filled 2012-11-29 (×8): qty 0.96

## 2012-11-29 MED ORDER — FAMOTIDINE IN NACL 20-0.9 MG/50ML-% IV SOLN
20.0000 mg | Freq: Two times a day (BID) | INTRAVENOUS | Status: DC
  Administered 2012-11-29 – 2012-11-30 (×3): 20 mg via INTRAVENOUS
  Filled 2012-11-29 (×4): qty 50

## 2012-11-29 MED ORDER — FENTANYL CITRATE 0.05 MG/ML IJ SOLN
50.0000 ug | Freq: Once | INTRAMUSCULAR | Status: AC
  Administered 2012-11-29: 50 ug via INTRAVENOUS
  Filled 2012-11-29: qty 2

## 2012-11-29 MED ORDER — METHYLPREDNISOLONE SODIUM SUCC 125 MG IJ SOLR
125.0000 mg | Freq: Once | INTRAMUSCULAR | Status: AC
  Administered 2012-11-29: 125 mg via INTRAVENOUS
  Filled 2012-11-29: qty 2

## 2012-11-29 MED ORDER — DIPHENHYDRAMINE HCL 50 MG/ML IJ SOLN
25.0000 mg | Freq: Four times a day (QID) | INTRAMUSCULAR | Status: DC
  Administered 2012-11-29 – 2012-12-01 (×9): 25 mg via INTRAVENOUS
  Filled 2012-11-29: qty 0.5
  Filled 2012-11-29 (×4): qty 1
  Filled 2012-11-29: qty 0.5
  Filled 2012-11-29: qty 1
  Filled 2012-11-29 (×2): qty 0.5
  Filled 2012-11-29: qty 1
  Filled 2012-11-29 (×4): qty 0.5

## 2012-11-29 MED ORDER — BIOTENE DRY MOUTH MT LIQD
15.0000 mL | Freq: Four times a day (QID) | OROMUCOSAL | Status: DC
  Administered 2012-11-29 – 2012-12-01 (×8): 15 mL via OROMUCOSAL

## 2012-11-29 MED ORDER — CHLORHEXIDINE GLUCONATE 0.12 % MT SOLN
15.0000 mL | Freq: Two times a day (BID) | OROMUCOSAL | Status: DC
  Administered 2012-11-29 – 2012-12-01 (×5): 15 mL via OROMUCOSAL
  Filled 2012-11-29 (×8): qty 15

## 2012-11-29 MED ORDER — PROPOFOL 10 MG/ML IV EMUL
INTRAVENOUS | Status: AC | PRN
  Administered 2012-11-29: 30 ug/kg/min via INTRAVENOUS

## 2012-11-29 MED ORDER — ASPIRIN 81 MG PO CHEW
81.0000 mg | CHEWABLE_TABLET | Freq: Every day | ORAL | Status: DC
  Administered 2012-11-30 – 2012-12-01 (×2): 81 mg
  Filled 2012-11-29 (×3): qty 1

## 2012-11-29 MED ORDER — FENTANYL CITRATE 0.05 MG/ML IJ SOLN
50.0000 ug | Freq: Once | INTRAMUSCULAR | Status: AC
  Administered 2012-11-29: 50 ug via INTRAVENOUS

## 2012-11-29 MED ORDER — EPINEPHRINE 0.3 MG/0.3ML IJ SOAJ: 0.3000 mg | Freq: Once | INTRAMUSCULAR | Status: DC

## 2012-11-29 MED ORDER — EPINEPHRINE 0.3 MG/0.3ML IJ SOAJ
0.3000 mg | Freq: Once | INTRAMUSCULAR | Status: AC
  Administered 2012-11-29: 0.3 mg via INTRAMUSCULAR
  Filled 2012-11-29: qty 0.3

## 2012-11-29 MED ORDER — HEPARIN SODIUM (PORCINE) 5000 UNIT/ML IJ SOLN
5000.0000 [IU] | Freq: Three times a day (TID) | INTRAMUSCULAR | Status: DC
  Administered 2012-11-29 – 2012-12-01 (×6): 5000 [IU] via SUBCUTANEOUS
  Filled 2012-11-29 (×9): qty 1

## 2012-11-29 MED ORDER — TIMOLOL MALEATE 0.5 % OP SOLN
1.0000 [drp] | Freq: Two times a day (BID) | OPHTHALMIC | Status: DC
  Administered 2012-11-29 – 2012-12-01 (×5): 1 [drp] via OPHTHALMIC
  Filled 2012-11-29: qty 5

## 2012-11-29 MED ORDER — FENTANYL CITRATE 0.05 MG/ML IJ SOLN
INTRAMUSCULAR | Status: AC
  Administered 2012-11-29: 50 ug via INTRAVENOUS
  Filled 2012-11-29: qty 2

## 2012-11-29 MED ORDER — INSULIN ASPART 100 UNIT/ML ~~LOC~~ SOLN
0.0000 [IU] | SUBCUTANEOUS | Status: DC
  Administered 2012-11-29: 4 [IU] via SUBCUTANEOUS
  Administered 2012-11-29: 3 [IU] via SUBCUTANEOUS
  Administered 2012-11-30 (×2): 4 [IU] via SUBCUTANEOUS
  Administered 2012-11-30 (×3): 3 [IU] via SUBCUTANEOUS

## 2012-11-29 MED ORDER — SODIUM CHLORIDE 0.9 % IV SOLN
INTRAVENOUS | Status: DC
  Administered 2012-11-29: 11:00:00 via INTRAVENOUS

## 2012-11-29 MED ORDER — ETOMIDATE 2 MG/ML IV SOLN
INTRAVENOUS | Status: AC | PRN
  Administered 2012-11-29: 20 mg via INTRAVENOUS

## 2012-11-29 NOTE — ED Notes (Signed)
NS 500cc bolus given for SBP 60's

## 2012-11-29 NOTE — Progress Notes (Addendum)
INITIAL NUTRITION ASSESSMENT  DOCUMENTATION CODES Per approved criteria  -Not Applicable   INTERVENTION: Recommend initiating Vital 1.2 @ 20 ml/hr via panda tube and increase by 10 ml every 4 hours to goal rate of 40 ml/hr. At goal rate, tube feeding regimen with propofol @ 5.3 ml/hr will provide 1292 kcal, 72 grams of protein, and 778 ml of H2O.   NUTRITION DIAGNOSIS: Inadequate oral intake related to inability to eat as evidenced by NPO.   Goal: Pt to meet >/= 90% of their estimated nutrition needs   Monitor:  I/O's, initiation and tolerance of TF regimen, weight, labs  Reason for Assessment: Consult  79 y.o. female  Admitting Dx: Angioedema  ASSESSMENT: 79 year old woman with angioedema and upper airway compromise. ETT placed 10/19.  Allergy work-up being performed per MD H&P. Pt currently intubated in ICU. Per daughter, pt has had no significant weight loss or gain.   Height: Ht Readings from Last 1 Encounters:  11/29/12 5\' 3"  (1.6 m)    Weight: Wt Readings from Last 1 Encounters:  11/29/12 133 lb 13.1 oz (60.7 kg)    Ideal Body Weight: 52.4 kg  % Ideal Body Weight: 116%  Wt Readings from Last 10 Encounters:  11/29/12 133 lb 13.1 oz (60.7 kg)  08/31/12 130 lb 5 oz (59.109 kg)  08/31/12 130 lb 5 oz (59.109 kg)    Usual Body Weight: unknown  % Usual Body Weight: unknown  BMI:  Body mass index is 23.71 kg/(m^2).  Estimated Nutritional Needs: Kcal: 1200-1300 Protein: 70-80 g Fluid: Per MD  Calorie need calculated based on: Minute Ventilation: 9.0 L/min Tmax: 37 degrees Celsius  Skin: WNL  Diet Order: NPO  EDUCATION NEEDS: -Education not appropriate at this time   Intake/Output Summary (Last 24 hours) at 11/29/12 1544 Last data filed at 11/29/12 1100  Gross per 24 hour  Intake     90 ml  Output   1350 ml  Net  -1260 ml    Last BM: PTA   Labs:   Recent Labs Lab 11/29/12 0630  NA 126*  K 3.2*  CL 89*  CO2 23  BUN 25*   CREATININE 0.67  CALCIUM 9.6  GLUCOSE 187*    CBG (last 3)   Recent Labs  11/29/12 1028 11/29/12 1201  GLUCAP 152* 143*    Scheduled Meds: . antiseptic oral rinse  15 mL Mouth Rinse QID  . aspirin  81 mg Per Tube Daily  . brinzolamide  1 drop Left Eye BID  . chlorhexidine  15 mL Mouth Rinse BID  . diphenhydrAMINE  25 mg Intravenous Q6H  . famotidine (PEPCID) IV  20 mg Intravenous Q12H  . heparin  5,000 Units Subcutaneous Q8H  . insulin aspart  0-20 Units Subcutaneous Q4H  . methylPREDNISolone (SOLU-MEDROL) injection  60 mg Intravenous Q6H  . timolol  1 drop Both Eyes BID    Continuous Infusions: . sodium chloride 20 mL/hr at 11/29/12 1039  . propofol 15 mcg/kg/min (11/29/12 1100)    Past Medical History  Diagnosis Date  . Pelvic prolapse     anterior floor  . Hypertension   . Hyperlipidemia   . Arthritis   . Glaucoma of both eyes   . Urgency of urination   . Urinary hesitancy     Past Surgical History  Procedure Laterality Date  . Retinal detachment surgery Right   . Pubovaginal sling N/A 08/31/2012    Procedure: [employer] UPHOLD LIGHT SACROSPINUS REPAIR;  Surgeon: Kathi Ludwig,  MD;  Location: [facility];  Service: Urology;  Laterality: N/A;    Ebbie Latus RD, LDN

## 2012-11-29 NOTE — ED Notes (Signed)
Pt transported via EMS from pt noted L hand swelling before going to bed last night, awoke 4am with significant angioedema. Pt took 50mg  Benadryl PO, EMS administered Zantac 50mg  IV. Airway intact.

## 2012-11-29 NOTE — ED Notes (Addendum)
Patient given sedative medications  and immediately -noted unresponsiveness at 0608 . Patient was completely intubated with assessment done by ED- MD at [phone] with the uses of a glidoscope.  No color changes. Tolerated well. Patient chest xray was done.

## 2012-11-29 NOTE — ED Provider Notes (Addendum)
CSN: [id number]     Arrival date & time 11/29/12  0434 History   First MD Initiated Contact with Patient 11/29/12 0457     Chief Complaint  Patient presents with  . Allergic Reaction   (Consider location/radiation/quality/duration/timing/severity/associated sxs/prior Treatment) HPI Patient presents with swollen tongue upon waking this morning at 4 AM. She states she went to bed with mild itching and swelling to the left hand. Unknown whether she sustained any sort of insect bite. She will with her tongue swollen. She's denies having any shortness of breath or wheezing. Denies any other rashes. She has had similar episodes of this in the past and workup in the allergist without finding any acute cause. She took 50 mg Benadryl prior to EMS arrival. EMS gave the patient 50 mg of Zantac. Patient states that her time has decreased in size she is feeling better. Denies any recent medication changes. Past Medical History  Diagnosis Date  . Pelvic prolapse     anterior floor  . Hypertension   . Hyperlipidemia   . Arthritis   . Glaucoma of both eyes   . Urgency of urination   . Urinary hesitancy    Past Surgical History  Procedure Laterality Date  . Retinal detachment surgery Right   . Pubovaginal sling N/A 08/31/2012    Procedure: [employer] UPHOLD LIGHT SACROSPINUS REPAIR;  Surgeon: Kathi Ludwig, MD;  Location: [facility];  Service: Urology;  Laterality: N/A;   No family history on file. History  Substance Use Topics  . Smoking status: Never Smoker   . Smokeless tobacco: Never Used  . Alcohol Use: Yes     Comment: rare   OB History   Grav Para Term Preterm Abortions TAB SAB Ect Mult Living                 Review of Systems  Constitutional: Negative for fever and chills.  HENT: Positive for voice change. Negative for sore throat and trouble swallowing.   Eyes: Negative for visual disturbance.  Respiratory: Negative for shortness of breath, wheezing  and stridor.   Cardiovascular: Negative for chest pain.  Gastrointestinal: Negative for nausea, vomiting and abdominal pain.  Skin: Positive for color change and rash.  Neurological: Negative for dizziness, weakness, light-headedness, numbness and headaches.  All other systems reviewed and are negative.    Allergies  Erythromycin  Home Medications   Current Outpatient Rx  Name  Route  Sig  Dispense  Refill  . aspirin 81 MG tablet   Oral   Take 81 mg by mouth daily.         Marland Kitchen atenolol-chlorthalidone (TENORETIC) 50-25 MG per tablet   Oral   Take 0.5 tablets by mouth daily.         . Calcium Carb-Cholecalciferol (CALCIUM 500 +D PO)   Oral   Take 1 tablet by mouth daily.         . Cholecalciferol (VITAMIN D3) 1000 UNITS CAPS   Oral   Take 1 capsule by mouth daily.         Marland Kitchen estradiol (ESTRACE) 0.1 MG/GM vaginal cream   Vaginal   Place 2 g vaginally daily.         . fish oil-omega-3 fatty acids 1000 MG capsule   Oral   Take 1 g by mouth daily.         Marland Kitchen glucosamine-chondroitin 500-400 MG tablet   Oral   Take 1 tablet by mouth daily.         Marland Kitchen  Loratadine 10 MG CAPS   Oral   Take 1 capsule by mouth daily.         . Multiple Vitamin (MULTIVITAMIN) tablet   Oral   Take 1 tablet by mouth daily.         . Probiotic Product (PROBIOTIC DAILY PO)   Oral   Take 1 capsule by mouth daily.         . timolol (BETIMOL) 0.25 % ophthalmic solution   Both Eyes   Place 1 drop into both eyes 2 (two) times daily.          SpO2 99% Physical Exam  Nursing note and vitals reviewed. Constitutional: She is oriented to person, place, and time. She appears well-developed and well-nourished. No distress.  HENT:  Head: Normocephalic and atraumatic.  Mouth/Throat: Oropharynx is clear and moist.  Patient with no lip swelling. She does have a large tongue. It appears to be mostly edematous towards the anterior portion. Oropharynx is clear with no obvious edema.  Patient has no stridor.  Eyes: EOM are normal. Pupils are equal, round, and reactive to light.  Neck: Normal range of motion. Neck supple.  Cardiovascular: Normal rate and regular rhythm.  Exam reveals no gallop and no friction rub.   No murmur heard. Pulmonary/Chest: Effort normal and breath sounds normal. No stridor. No respiratory distress. She has no wheezes. She has no rales. She exhibits no tenderness.  Abdominal: Soft. Bowel sounds are normal. She exhibits no distension and no mass. There is no tenderness. There is no rebound and no guarding.  Musculoskeletal: Normal range of motion. She exhibits no edema and no tenderness.  Neurological: She is alert and oriented to person, place, and time.  Patient is alert and oriented x3 with muffled, goal oriented speech. Patient has 5/5 motor in all extremities. Sensation is intact to light touch.   Skin: Skin is warm and dry. No rash noted. There is erythema.  On the dorsum of the patient's left hand she has area of erythema and swelling. There is no tenderness to palpation. No obvious insect bites.  Psychiatric: She has a normal mood and affect. Her behavior is normal.    ED Course  INTUBATION Date/Time: 11/29/2012 6:29 AM Performed by: Loren Racer Authorized by: Loren Racer Consent: Verbal consent obtained. The procedure was performed in an emergent situation. Patient identity confirmed: verbally with patient Time out: Immediately prior to procedure a "time out" was called to verify the correct patient, procedure, equipment, support staff and site/side marked as required. Indications: airway protection Intubation method: video-assisted Patient status: awake Preoxygenation: nonrebreather mask Pretreatment medications: none Sedatives: etomidate Paralytic: succinylcholine Tube size: 6.5 mm Tube type: cuffed Number of attempts: 1 Cords visualized: yes Post-procedure assessment: chest rise and CO2 detector Breath sounds:  equal Cuff inflated: yes ETT to lip: 22 cm Tube secured with: ETT holder Chest x-ray interpreted by me. Chest x-ray findings: endotracheal tube in appropriate position Patient tolerance: Patient tolerated the procedure well with no immediate complications. Comments: Met with subglottic resistance but able to advance tube.    (including critical care time) Labs Review Labs Reviewed - No data to display Imaging Review No results found.  EKG Interpretation   None     CRITICAL CARE Performed by: Ranae Palms, Caniyah Murley Total critical care time: 30 min Critical care time was exclusive of separately billable procedures and treating other patients. Critical care was necessary to treat or prevent imminent or life-threatening deterioration. Critical care was time spent personally by  me on the following activities: development of treatment plan with patient and/or surrogate as well as nursing, discussions with consultants, evaluation of patient's response to treatment, examination of patient, obtaining history from patient or surrogate, ordering and performing treatments and interventions, ordering and review of laboratory studies, ordering and review of radiographic studies, pulse oximetry and re-evaluation of patient's condition.  MDM   Given the patient's symptoms are improving watch the patient very closely in the emergency department. If he shows any signs of worsening the patient will need to be intubated for airway protection. We'll give the patient IV steroids and EpiPen injection.   Loren Racer, MD 11/29/12 [phone]  Reevaluation the patient after medications shows that her tongue swelling has improved though her posterior oropharynx now is becoming much more swollen she also is having increased swelling in her submental area. Given the swelling is improving I discussed with the patient the need to electively intubate her to protect her airway. She is in agreement with plan.  Discussed  with Dr. Darrol Angel. Will see the patient in the emergency department.  Loren Racer, MD 11/29/12 [phone]

## 2012-11-29 NOTE — H&P (Signed)
PULMONARY  / CRITICAL CARE MEDICINE  Name: Emily Robertson MRN: [id number] DOB: 06-Dec-1930    ADMISSION DATE:  11/29/2012 ADMISSION DATE:  11/29/12  REFERRING MD :  Ranae Palms [facility] ED PRIMARY SERVICE: PCCM  CHIEF COMPLAINT:  UA swelling  BRIEF PATIENT DESCRIPTION:  79 year old woman with angioedema and upper airway compromise. ETT placed 10/19.  SIGNIFICANT EVENTS / STUDIES:  ETT for angioedema 10/19  LINES / TUBES: ETT 10/19 >>   CULTURES: none  ANTIBIOTICS: none  HISTORY OF PRESENT ILLNESS:   79 year old never smoker with hypertension, osteoarthritis, glaucoma. She has a history of apparent allergic mediated angioedema. This has happened 3 or 4 times in the last several years resulting in ER visits and hospitalization but never intubation. She reports that she was working in her yard on 10/18. On the same evening she noticed itching and swelling without any distinct erythema or rash in her left hand. She was then awakened at 4 AM with severe tongue swelling. She took Benadryl and was brought to the emergency room at [facility]. There was no reported stridor on her arrival. She was given Zantac, Solu-Medrol, and an EpiPen. Her tongue swelling seemed to decrease somewhat, but there was progressive posterior pharyngeal edema. She was electively intubated and will be admitted to ICU for further care. Per her family there have been no new medications, no new exposures. There has been no infectious prodrome or other associated symptoms. Because this has happened before, the patient has taken notes regarding possible exposures and an allergy workup has been performed. For a period of time she avoided nuts. No precipitating factors have ever been identified.   PAST MEDICAL HISTORY :  Past Medical History  Diagnosis Date  . Pelvic prolapse     anterior floor  . Hypertension   . Hyperlipidemia   . Arthritis   . Glaucoma of both eyes   . Urgency of urination   . Urinary hesitancy    Past  Surgical History  Procedure Laterality Date  . Retinal detachment surgery Right   . Pubovaginal sling N/A 08/31/2012    Procedure: BOSTON SCIENTIFIC UPHOLD LIGHT SACROSPINUS REPAIR;  Surgeon: Kathi Ludwig, MD;  Location: [facility];  Service: Urology;  Laterality: N/A;   Prior to Admission medications   Medication Sig Start Date End Date Taking? Authorizing Provider  alendronate (FOSAMAX) 70 MG tablet Take 70 mg by mouth every 7 (seven) days. Take with a full glass of water on an empty stomach.   Yes Historical Provider, MD  aspirin 81 MG tablet Take 81 mg by mouth daily.   Yes Historical Provider, MD  atenolol-chlorthalidone (TENORETIC) 50-25 MG per tablet Take 0.5 tablets by mouth 2 (two) times daily.    Yes Historical Provider, MD  brinzolamide (AZOPT) 1 % ophthalmic suspension Place 1 drop into the left eye 2 (two) times daily.   Yes Historical Provider, MD  Calcium Carb-Cholecalciferol (CALCIUM 500 +D PO) Take 1 tablet by mouth daily.   Yes Historical Provider, MD  Cholecalciferol (VITAMIN D3) 1000 UNITS CAPS Take 1 capsule by mouth daily.   Yes Historical Provider, MD  fish oil-omega-3 fatty acids 1000 MG capsule Take 1 g by mouth daily.   Yes Historical Provider, MD  glucosamine-chondroitin 500-400 MG tablet Take 1 tablet by mouth daily.   Yes Historical Provider, MD  loratadine (CLARITIN) 10 MG tablet Take 10 mg by mouth daily.   Yes Historical Provider, MD  Multiple Vitamin (MULTIVITAMIN) tablet Take 1 tablet by mouth  daily.   Yes Historical Provider, MD  Probiotic Product (PROBIOTIC DAILY PO) Take 1 capsule by mouth daily.   Yes Historical Provider, MD  timolol (TIMOPTIC) 0.5 % ophthalmic solution 1 drop 2 (two) times daily.   Yes Historical Provider, MD   Allergies  Allergen Reactions  . Erythromycin Itching    FAMILY HISTORY:  No family history on file. SOCIAL HISTORY:  reports that she has never smoked. She has never used smokeless tobacco. She reports  that she drinks alcohol. She reports that she does not use illicit drugs.  REVIEW OF SYSTEMS:  Rash oin L hand developed 10/18 pm. No other reported new findings or complaints. No sick prodrome.   SUBJECTIVE:  Reports comfortable on MV, but wants to be extubated ASAP  VITAL SIGNS: Pulse Rate:  [67-80] 67 (10/19 0745) Resp:  [18-29] 18 (10/19 0745) BP: (67-144)/(36-97) 118/55 mmHg (10/19 0745) SpO2:  [93 %-100 %] 100 % (10/19 0745) FiO2 (%):  [30 %-40 %] 30 % (10/19 0818) HEMODYNAMICS:   VENTILATOR SETTINGS: Vent Mode:  [-] CPAP FiO2 (%):  [30 %-40 %] 30 % Set Rate:  [18 bmp] 18 bmp Vt Set:  [450 mL] 450 mL PEEP:  [5 cmH20] 5 cmH20 Pressure Support:  [12 cmH20] 12 cmH20 INTAKE / OUTPUT: Intake/Output     10/18 0701 - 10/19 0700 10/19 0701 - 10/20 0700   Urine  250   Total Output   250   Net   -250          PHYSICAL EXAMINATION: General:  Elderly woman comfortable on mechanical ventilation Neuro:  Interacting, appropriate, well alert and oriented, moves all extremities with equal strength HEENT:  Endotracheal tube in place, lips are normal size, tongue is enlarged with a small laceration on the inferior aspect of the mid-tongue Cardiovascular:  Regular rate and rhythm, no murmurs rubs gallop Lungs:  Clear bilaterally Abdomen:  Benign, soft nontender Musculoskeletal:  No deformities, no edema Skin:  Some very mild erythema on the dorsal aspect of the left hand without clear macules or rash  LABS:  CBC Recent Labs     11/29/12  0630  WBC  15.2*  HGB  13.9  HCT  38.6  PLT  311   Coag's No results found for this basename: APTT, INR,  in the last 72 hours BMET Recent Labs     11/29/12  0630  NA  126*  K  3.2*  CL  89*  CO2  23  BUN  25*  CREATININE  0.67  GLUCOSE  187*   Electrolytes Recent Labs     11/29/12  0630  CALCIUM  9.6   Sepsis Markers No results found for this basename: LACTICACIDVEN, PROCALCITON, O2SATVEN,  in the last 72 hours ABG Recent  Labs     11/29/12  0735  PHART  7.487*  PCO2ART  29.4*  PO2ART  152.0*   Liver Enzymes Recent Labs     11/29/12  0630  AST  38*  ALT  26  ALKPHOS  62  BILITOT  0.5  ALBUMIN  4.0   Cardiac Enzymes No results found for this basename: TROPONINI, PROBNP,  in the last 72 hours Glucose No results found for this basename: GLUCAP,  in the last 72 hours  Imaging Dg Chest Port 1 View  11/29/2012   CLINICAL DATA:  Post intubation  EXAM: PORTABLE CHEST - 1 VIEW  COMPARISON:  None.  FINDINGS: Endotracheal tube ends 4 cm above the carina. Normal  heart size for technique. No acute abnormality of the upper mediastinal contours. Bronchitic changes diffusely without edema or focal opacity to suggest pneumonia. No effusion. Bilateral upper lung edges most consistent with skin folds.  IMPRESSION: 1. Endotracheal tube in good position. 2. Bronchitic changes, possibly COPD, without evidence of edema or pneumonia.   Electronically Signed   By: Tiburcio Pea M.D.   On: 11/29/2012 06:40     CXR: 10/19 ETT in place, no infiltrates  ASSESSMENT / PLAN:  PULMONARY A: Acute respiratory failure due to upper airway compromise and angioedema P:   - Mechanical ventilation and airway protection until we can establish that her tongue and posterior pharynx and returned to normal size. Will attempt to use pressure support as her primary mode of ventilation to maximize comfort and to minimize sedation - Corticosteroids, Benadryl, Pepcid - VAP bundle in place  CARDIOVASCULAR A: Hypertension  P:  - Hold her home atenolol-chlorthalidone while she is on sedation - metoprolol and hydralazine when necessary if she becomes hypertensive  OPTHO A: Glaucoma P:  - continue her home eyedrops: brinzolamide, timolol  RENAL A:  No acute issues P:    GASTROINTESTINAL A:  Nutrition  P:   - place Panda tube and start tube feeding - SUP  HEMATOLOGIC A:  No acute issues P:   INFECTIOUS A:  Leukocytosis,  suspect due to corticosteroids and stress response P:   - follow CBC, perform cultures if fever or white blood cell count increasing 10/20  ENDOCRINE A:  Hyperglycemia on corticosteroids, nondiabetic P:   - CBG and sliding scale insulin  NEUROLOGIC A:  Sedation  P:   - propofol and fentanyl when necessary  TODAY'S SUMMARY:   I have personally obtained a history, examined the patient, evaluated laboratory and imaging results, formulated the assessment and plan and placed orders.  CRITICAL CARE: The patient is critically ill with multiple organ systems failure and requires high complexity decision making for assessment and support, frequent evaluation and titration of therapies, application of advanced monitoring technologies and extensive interpretation of multiple databases. Critical Care Time devoted to patient care services described in this note is 60 minutes.    Levy Pupa, MD, PhD 11/29/2012, 8:57 AM [facility] [phone] or if no answer [phone]

## 2012-11-30 ENCOUNTER — Inpatient Hospital Stay (HOSPITAL_COMMUNITY): Payer: Medicare Other

## 2012-11-30 DIAGNOSIS — Z5189 Encounter for other specified aftercare: Secondary | ICD-10-CM

## 2012-11-30 LAB — GLUCOSE, CAPILLARY
Glucose-Capillary: 122 mg/dL — ABNORMAL HIGH (ref 70–99)
Glucose-Capillary: 134 mg/dL — ABNORMAL HIGH (ref 70–99)
Glucose-Capillary: 142 mg/dL — ABNORMAL HIGH (ref 70–99)
Glucose-Capillary: 166 mg/dL — ABNORMAL HIGH (ref 70–99)

## 2012-11-30 LAB — BASIC METABOLIC PANEL
BUN: 26 mg/dL — ABNORMAL HIGH (ref 6–23)
CO2: 24 mEq/L (ref 19–32)
Chloride: 93 mEq/L — ABNORMAL LOW (ref 96–112)
Creatinine, Ser: 0.82 mg/dL (ref 0.50–1.10)
GFR calc non Af Amer: 66 mL/min — ABNORMAL LOW (ref >90)
Glucose, Bld: 146 mg/dL — ABNORMAL HIGH (ref 70–99)

## 2012-11-30 LAB — CBC
HCT: 37 % (ref 36.0–46.0)
Hemoglobin: 13.3 g/dL (ref 12.0–15.0)
MCH: 31.6 pg (ref 26.0–34.0)
MCHC: 35.9 g/dL (ref 30.0–36.0)
MCV: 87.9 fL (ref 78.0–100.0)
Platelets: 306 10*3/uL (ref 150–400)
RBC: 4.21 MIL/uL (ref 3.87–5.11)
RDW: 12.7 % (ref 11.5–15.5)
WBC: 16.8 10*3/uL — ABNORMAL HIGH (ref 4.0–10.5)

## 2012-11-30 MED ORDER — METHYLPREDNISOLONE SODIUM SUCC 40 MG IJ SOLR
40.0000 mg | Freq: Four times a day (QID) | INTRAMUSCULAR | Status: DC
  Administered 2012-11-30 – 2012-12-01 (×5): 40 mg via INTRAVENOUS
  Filled 2012-11-30 (×7): qty 1

## 2012-11-30 MED ORDER — ATENOLOL 25 MG PO TABS
25.0000 mg | ORAL_TABLET | Freq: Every day | ORAL | Status: DC
  Administered 2012-11-30 – 2012-12-01 (×2): 25 mg via ORAL
  Filled 2012-11-30 (×2): qty 1

## 2012-11-30 MED ORDER — INSULIN ASPART 100 UNIT/ML ~~LOC~~ SOLN
0.0000 [IU] | Freq: Three times a day (TID) | SUBCUTANEOUS | Status: DC
  Administered 2012-11-30: 7 [IU] via SUBCUTANEOUS
  Administered 2012-12-01: 3 [IU] via SUBCUTANEOUS

## 2012-11-30 MED ORDER — FAMOTIDINE 20 MG PO TABS
20.0000 mg | ORAL_TABLET | Freq: Two times a day (BID) | ORAL | Status: DC
  Administered 2012-11-30 – 2012-12-01 (×2): 20 mg via ORAL
  Filled 2012-11-30 (×3): qty 1

## 2012-11-30 MED ORDER — POTASSIUM CHLORIDE CRYS ER 20 MEQ PO TBCR
40.0000 meq | EXTENDED_RELEASE_TABLET | Freq: Once | ORAL | Status: AC
  Administered 2012-11-30: 40 meq via ORAL
  Filled 2012-11-30: qty 2

## 2012-11-30 NOTE — Progress Notes (Signed)
CARE MANAGEMENT NOTE 11/30/2012  Patient:  Emily Robertson, Emily Robertson   Account Number:  [id number]  Date Initiated:  11/30/2012  Documentation initiated by:  Jovann Luse  Subjective/Objective Assessment:   pt with allergic reaction to insect bite of the hand, presented to ed with airway obstruction due to angioedema. Intubated and moved to icu.     Action/Plan:   home when stable   Anticipated DC Date:  12/01/2012   Anticipated DC Plan:  HOME/SELF CARE  In-house referral  NA      DC Planning Services  NA      [facility] Choice  NA   Choice offered to / List presented to:  NA   DME arranged  NA      DME agency  NA     HH arranged  NA      HH agency  NA   Status of service:  In process, will continue to follow Medicare Important Message given?  NA - LOS <3 / Initial given by admissions (If response is "NO", the following Medicare IM given date fields will be blank) Date Medicare IM given:   Date Additional Medicare IM given:    Discharge Disposition:    Per UR Regulation:  Reviewed for med. necessity/level of care/duration of stay  If discussed at Long Length of Stay Meetings, dates discussed:    Comments:  [id number]/Bisma Klett Stark Jock, BSN, [place] [id number] Chart Reviewed for discharge and hospital needs. Discharge needs at time of review:  None/Transferred to med/surg floor on [id number]. Review of patient progress due on [id number].

## 2012-11-30 NOTE — Procedures (Signed)
Extubation Procedure Note  Patient Details:   Name: Emily Robertson DOB: 1930/07/08 MRN: [id number]   Airway Documentation:     Evaluation  O2 sats: stable throughout Complications: No apparent complications Patient did tolerate procedure well. Bilateral Breath Sounds: Diminished Suctioning: Airway Yes  Veryl Speak 11/30/2012, 10:22 AM

## 2012-11-30 NOTE — H&P (Signed)
PULMONARY  / CRITICAL CARE MEDICINE  Name: Emily Robertson MRN: [id number] DOB: Feb 27, 1930    ADMISSION DATE:  11/29/2012 ADMISSION DATE:  11/29/12  REFERRING MD :  Ranae Palms [facility] ED PRIMARY SERVICE: PCCM  CHIEF COMPLAINT:  UA swelling  BRIEF PATIENT DESCRIPTION:  79 year old woman with angioedema and upper airway compromise. ETT placed 10/19.  SIGNIFICANT EVENTS / STUDIES:  ETT for angioedema 10/19  LINES / TUBES: ETT 10/19 >> 10/20  CULTURES: none  ANTIBIOTICS: none   SUBJECTIVE:  Looks good and good air leak  VITAL SIGNS: Temp:  [97.8 F (36.6 C)-99.3 F (37.4 C)] 97.8 F (36.6 C) (10/20 0815) Pulse Rate:  [63-90] 63 (10/20 0800) Resp:  [10-26] 17 (10/20 0800) BP: (93-160)/(31-93) 119/52 mmHg (10/20 0800) SpO2:  [98 %-100 %] 100 % (10/20 0800) FiO2 (%):  [30 %] 30 % (10/20 0804) Weight:  [59.4 kg (130 lb 15.3 oz)] 59.4 kg (130 lb 15.3 oz) (10/20 0400) HEMODYNAMICS:   VENTILATOR SETTINGS: Vent Mode:  [-] PSV;CPAP FiO2 (%):  [30 %] 30 % Set Rate:  [18 bmp] 18 bmp Vt Set:  [450 mL] 450 mL PEEP:  [5 cmH20] 5 cmH20 Pressure Support:  [8 cmH20-12 cmH20] 12 cmH20 Plateau Pressure:  [12 cmH20-15 cmH20] 14 cmH20 Successfully extubated to RA 945AM INTAKE / OUTPUT: Intake/Output     10/19 0701 - 10/20 0700 10/20 0701 - 10/21 0700   I.V. (mL/kg) 373 (6.3) 47.3 (0.8)   IV Piggyback 100    Total Intake(mL/kg) 473 (8) 47.3 (0.8)   Urine (mL/kg/hr) 2165 (1.5) 50 (0.3)   Total Output 2165 50   Net -1692 -2.7        Stool Occurrence 1 x      PHYSICAL EXAMINATION: General:  Elderly woman comfortable on mechanical ventilation Neuro:  Interacting, appropriate, well alert and oriented, moves all extremities with equal strength HEENT:  Endotracheal tube in place, lips are normal size, tongue are better  Cardiovascular:  Regular rate and rhythm, no murmurs rubs gallop Lungs:  Clear bilaterally Abdomen:  Benign, soft nontender Musculoskeletal:  No deformities, no  edema Skin:  Some very mild erythema on the dorsal aspect of the left hand without clear macules or rash  LABS:  CBC Recent Labs     11/29/12  0630  11/30/12  0808  WBC  15.2*  16.8*  HGB  13.9  13.3  HCT  38.6  37.0  PLT  311  306   Coag's No results found for this basename: APTT, INR,  in the last 72 hours BMET Recent Labs     11/29/12  0630  11/30/12  0808  NA  126*  129*  K  3.2*  3.2*  CL  89*  93*  CO2  23  24  BUN  25*  26*  CREATININE  0.67  0.82  GLUCOSE  187*  146*   Electrolytes Recent Labs     11/29/12  0630  11/30/12  0808  CALCIUM  9.6  9.5   Sepsis Markers No results found for this basename: LACTICACIDVEN, PROCALCITON, O2SATVEN,  in the last 72 hours ABG Recent Labs     11/29/12  0735  PHART  7.487*  PCO2ART  29.4*  PO2ART  152.0*   Liver Enzymes Recent Labs     11/29/12  0630  AST  38*  ALT  26  ALKPHOS  62  BILITOT  0.5  ALBUMIN  4.0   Cardiac Enzymes No results found for this basename:  TROPONINI, PROBNP,  in the last 72 hours Glucose Recent Labs     11/29/12  1201  11/29/12  1552  11/29/12  1948  11/30/12  0012  11/30/12  0357  11/30/12  0729  GLUCAP  143*  137*  116*  134*  142*  122*    Imaging Dg Abd 1 View  11/29/2012   CLINICAL DATA:  Feeding tube placement  EXAM: ABDOMEN - 1 VIEW  COMPARISON:  None.  FINDINGS: The tip of the feeding tube is in the body of the stomach. Generalized colonic distention is noted.  IMPRESSION: Feeding tube tip is in the body of stomach.   Electronically Signed   By: Maryclare Bean M.D.   On: 11/29/2012 11:25   Dg Chest Port 1 View  11/30/2012   CLINICAL DATA:  Hypoxia  EXAM: PORTABLE CHEST - 1 VIEW  COMPARISON:  November 29, 2012  FINDINGS: Endotracheal tube tip is 3.4 cm above the carina. The feeding tube extends below the diaphragm. No pneumothorax. There is underlying emphysematous change. There is no edema or consolidation. Heart size and pulmonary vascularity are normal.  IMPRESSION: Tube  positions as described. No pneumothorax. Underlying emphysema. No edema or consolidation.   Electronically Signed   By: Bretta Bang M.D.   On: 11/30/2012 07:05   Dg Chest Port 1 View  11/29/2012   CLINICAL DATA:  Post intubation  EXAM: PORTABLE CHEST - 1 VIEW  COMPARISON:  None.  FINDINGS: Endotracheal tube ends 4 cm above the carina. Normal heart size for technique. No acute abnormality of the upper mediastinal contours. Bronchitic changes diffusely without edema or focal opacity to suggest pneumonia. No effusion. Bilateral upper lung edges most consistent with skin folds.  IMPRESSION: 1. Endotracheal tube in good position. 2. Bronchitic changes, possibly COPD, without evidence of edema or pneumonia.   Electronically Signed   By: Tiburcio Pea M.D.   On: 11/29/2012 06:40     CXR: 10/19 ETT in place, no infiltrates  ASSESSMENT / PLAN:  PULMONARY A: Acute respiratory failure due to upper airway compromise and angioedema Resolved>>extubate 10/20 P:   Extubate - Corticosteroids, Benadryl, Pepcid>>cont same   CARDIOVASCULAR A: Hypertension  P:  -resume po htn meds   OPTHO A: Glaucoma P:  - continue her home eyedrop:  timolol  RENAL A:  No acute issues P:   monitor GASTROINTESTINAL A:  Nutrition  P:   - d/c panda -advance diet - SUP  HEMATOLOGIC A:  No acute issues P:  monitor  INFECTIOUS A:  Leukocytosis, suspect due to corticosteroids and stress response P:   - follow CBC, perform cultures if fever or white blood cell count increasing 10/20  ENDOCRINE A:  Hyperglycemia on corticosteroids, nondiabetic P:   - CBG and sliding scale insulin  NEUROLOGIC A:  Sedation >>d/c sedation P:   D/c sedation  TODAY'S SUMMARY:  79 y.o.F with angioedema d/t insect bite and anaphylaxsis with angioedema Plan extubation and advance diet .  D/c 24hrs to home   I have personally obtained a history, examined the patient, evaluated laboratory and imaging results,  formulated the assessment and plan and placed orders.  CRITICAL CARE: The patient is critically ill with multiple organ systems failure and requires high complexity decision making for assessment and support, frequent evaluation and titration of therapies, application of advanced monitoring technologies and extensive interpretation of multiple databases. Critical Care Time devoted to patient care services described in this note is 30 minutes.   Luisa Hart WrightMD  11/30/2012, 10:02 AM  Pulmonary and Critical Care Beeper  [phone]  Cell  [phone]  If no response or cell goes to voicemail, call beeper [phone]

## 2012-12-01 ENCOUNTER — Telehealth: Payer: Self-pay | Admitting: Emergency Medicine

## 2012-12-01 LAB — GLUCOSE, CAPILLARY
Glucose-Capillary: 146 mg/dL — ABNORMAL HIGH (ref 70–99)
Glucose-Capillary: 112 mg/dL — ABNORMAL HIGH (ref 70–99)

## 2012-12-01 LAB — CBC
HCT: 36.2 % (ref 36.0–46.0)
Hemoglobin: 12.9 g/dL (ref 12.0–15.0)
MCH: 31.9 pg (ref 26.0–34.0)
MCHC: 35.6 g/dL (ref 30.0–36.0)
MCV: 89.6 fL (ref 78.0–100.0)
RBC: 4.04 MIL/uL (ref 3.87–5.11)
WBC: 17 10*3/uL — ABNORMAL HIGH (ref 4.0–10.5)

## 2012-12-01 LAB — BASIC METABOLIC PANEL
BUN: 29 mg/dL — ABNORMAL HIGH (ref 6–23)
GFR calc non Af Amer: 68 mL/min — ABNORMAL LOW (ref >90)
Glucose, Bld: 152 mg/dL — ABNORMAL HIGH (ref 70–99)
Potassium: 3.7 mEq/L (ref 3.5–5.1)
Sodium: 130 mEq/L — ABNORMAL LOW (ref 135–145)

## 2012-12-01 MED ORDER — PREDNISONE 10 MG PO TABS: ORAL_TABLET | ORAL | Status: DC

## 2012-12-01 MED ORDER — EPINEPHRINE 0.3 MG/0.3ML IJ SOAJ: 0.3000 mg | Freq: Once | INTRAMUSCULAR | Status: DC

## 2012-12-01 MED ORDER — FAMOTIDINE 20 MG PO TABS: ORAL_TABLET | ORAL | Status: DC

## 2012-12-01 NOTE — Telephone Encounter (Signed)
Error.Emily Robertson ° °

## 2012-12-01 NOTE — Progress Notes (Signed)
NUTRITION FOLLOW UP  Intervention:   - Encouraged outpatient GI and immunologist follow up - Nutrition signing off, pt eating excellent without any inpatient nutritional concerns   Nutrition Dx:   Inadequate oral intake related to inability to eat as evidenced by NPO - resolved, diet advanced, pt eating 100% on regular diet  Goal:   Pt to meet >/= 90% of their estimated nutrition needs - met   Assessment:   79 year old woman with angioedema and upper airway compromise. ETT placed 10/19, extubated 10/20.   Pt discussed during multidisciplinary rounds.   Met with pt who reports good appetite PTA with stable weight. States she started drinking Boost at home since July of this year when she had surgery for prolapsed bladder to get more energy. Ate 100% of breakfast. States she has seen an immunologist in the past but was confused with the results. Thinks she may be allergic to nuts but says she can eat small amounts of them without any problems, same goes for hot peppers. Pt also c/o on/off diarrhea for a long time. States she is going home today.    Height: Ht Readings from Last 1 Encounters:  11/30/12 5\' 3"  (1.6 m)    Weight Status:   Wt Readings from Last 1 Encounters:  11/30/12 133 lb (60.328 kg)    Re-estimated needs:  Kcal: 1500-1700 Protein: 60-75g Fluid: 1.5-1.7L/day  Skin: +1 LUE edema  Diet Order: General   Intake/Output Summary (Last 24 hours) at 12/01/12 1130 Last data filed at 12/01/12 0949  Gross per 24 hour  Intake    910 ml  Output    150 ml  Net    760 ml    Last BM: 10/19   Labs:   Recent Labs Lab 11/29/12 0630 11/30/12 0808 12/01/12 0350  NA 126* 129* 130*  K 3.2* 3.2* 3.7  CL 89* 93* 96  CO2 23 24 24   BUN 25* 26* 29*  CREATININE 0.67 0.82 0.80  CALCIUM 9.6 9.5 9.3  GLUCOSE 187* 146* 152*    CBG (last 3)   Recent Labs  11/30/12 1719 11/30/12 2105 12/01/12 0720  GLUCAP 166* 219* 146*    Scheduled Meds: . antiseptic oral  rinse  15 mL Mouth Rinse QID  . aspirin  81 mg Per Tube Daily  . atenolol  25 mg Oral Daily  . chlorhexidine  15 mL Mouth Rinse BID  . diphenhydrAMINE  25 mg Intravenous Q6H  . famotidine  20 mg Oral BID  . heparin  5,000 Units Subcutaneous Q8H  . insulin aspart  0-20 Units Subcutaneous TID AC & HS  . methylPREDNISolone (SOLU-MEDROL) injection  40 mg Intravenous Q6H  . timolol  1 drop Both Eyes BID    Continuous Infusions: . sodium chloride 10 mL/hr at 11/30/12 2200     Levon Hedger MS, RD, LDN [phone] Pager [phone] After Hours Pager

## 2012-12-01 NOTE — Discharge Summary (Signed)
Physician Discharge Summary     Patient ID: Emily Robertson MRN: [id number] DOB/AGE: 77/06/1930 77 y.o.  Admit date: 11/29/2012 Discharge date: 12/01/2012  Discharge Diagnoses:  Acute respiratory failure due to upper airway compromise and angioedema Resolved>>extubate 10/20 Hypertension Glaucoma Detailed Hospital Course:    79 year old never smoker with hypertension, osteoarthritis, glaucoma. She has a history of apparent allergic mediated angioedema. This has happened 3 or 4 times in the last several years resulting in ER visits and hospitalization but never intubation. She reported that she was working in her yard on 10/18. On the same evening she noticed itching and swelling without any distinct erythema or rash in her left hand. She was then awakened at 4 AM with severe tongue swelling. She took Benadryl and was brought to the emergency room at [facility]. There was no reported stridor on her arrival. She was given Zantac, Solu-Medrol, and an EpiPen. Her tongue swelling seemed to decrease somewhat, but there was progressive posterior pharyngeal edema. She was electively intubated and will be admitted to ICU for further care. Per her family there have been no new medications, no new exposures. There has been no infectious prodrome or other associated symptoms. Because this has happened before, the patient has taken notes regarding possible exposures and an allergy workup has been performed. For a period of time she avoided nuts. No precipitating factors have ever been identified.  She was admitted to the ICU on the ventilator. Therapeutic interventions included: supportive care with oral airway support (ETT), mechanical ventilation, sedating infusions, IV steroids, benadryl, pepcid and supportive care. Airway edema resolved. Leak test (to assess airway edema )was observed and pt was successfully extubated on 10/20. She continues to approach baseline and was medically felt clear for d/c as of 10/21. She  will be discharged to home with the plan as outlined below.     Discharge Plan by diagnoses   Acute respiratory failure due to upper airway compromise and angioedema Resolved>>extubate 10/20  Plan:  Home today taper pred 6d pepcid bid PRN claritin  Epi pen F/u With PCP.  Might need allergist referral     Significant Hospital tests/ studies/ interventions and procedures  Consults  Discharge Exam: BP 119/69  Pulse 78  Temp(Src) 98.1 F (36.7 C) (Oral)  Resp 18  Ht 5\' 3"  (1.6 m)  Wt 60.328 kg (133 lb)  BMI 23.57 kg/m2  SpO2 94%  PHYSICAL EXAMINATION:  General: Elderly woman comfortable  Neuro: Interacting, appropriate, well alert and oriented, moves all extremities with equal strength  HEENT:lips are normal size, tongue are better  Cardiovascular: Regular rate and rhythm, no murmurs rubs gallop  Lungs: Clear bilaterally  Abdomen: Benign, soft nontender  Musculoskeletal: No deformities, no edema  Skin: Some very mild erythema on the dorsal aspect of the left hand without clear macules or rash   Labs at discharge Lab Results  Component Value Date   CREATININE 0.80 12/01/2012   BUN 29* 12/01/2012   NA 130* 12/01/2012   K 3.7 12/01/2012   CL 96 12/01/2012   CO2 24 12/01/2012   Lab Results  Component Value Date   WBC 17.0* 12/01/2012   HGB 12.9 12/01/2012   HCT 36.2 12/01/2012   MCV 89.6 12/01/2012   PLT 302 12/01/2012   Lab Results  Component Value Date   ALT 26 11/29/2012   AST 38* 11/29/2012   ALKPHOS 62 11/29/2012   BILITOT 0.5 11/29/2012   No results found for this basename: INR,  PROTIME  Current radiology studies Dg Chest Port 1 View  11/30/2012   CLINICAL DATA:  Hypoxia  EXAM: PORTABLE CHEST - 1 VIEW  COMPARISON:  November 29, 2012  FINDINGS: Endotracheal tube tip is 3.4 cm above the carina. The feeding tube extends below the diaphragm. No pneumothorax. There is underlying emphysematous change. There is no edema or consolidation. Heart size  and pulmonary vascularity are normal.  IMPRESSION: Tube positions as described. No pneumothorax. Underlying emphysema. No edema or consolidation.   Electronically Signed   By: Bretta Bang M.D.   On: 11/30/2012 07:05    Disposition:  01-Home or Self Care      Discharge Orders   Future Orders Complete By Expires   Diet - low sodium heart healthy  As directed    Increase activity slowly  As directed        Medication List         alendronate 70 MG tablet  Commonly known as:  FOSAMAX  Take 70 mg by mouth every 7 (seven) days. Take with a full glass of water on an empty stomach.     aspirin 81 MG tablet  Take 81 mg by mouth daily.     atenolol-chlorthalidone 50-25 MG per tablet  Commonly known as:  TENORETIC  Take 0.5 tablets by mouth 2 (two) times daily.     brinzolamide 1 % ophthalmic suspension  Commonly known as:  AZOPT  Place 1 drop into the left eye 2 (two) times daily.     CALCIUM 500 +D PO  Take 1 tablet by mouth daily.     EPINEPHrine 0.3 mg/0.3 mL Soaj injection  Commonly known as:  EPIPEN  Inject 0.3 mLs (0.3 mg total) into the muscle once.     famotidine 20 MG tablet  Commonly known as:  PEPCID  Take 1 tab twice a day for 6 days     fish oil-omega-3 fatty acids 1000 MG capsule  Take 1 g by mouth daily.     glucosamine-chondroitin 500-400 MG tablet  Take 1 tablet by mouth daily.     loratadine 10 MG tablet  Commonly known as:  CLARITIN  Take 10 mg by mouth daily.     multivitamin tablet  Take 1 tablet by mouth daily.     predniSONE 10 MG tablet  Commonly known as:  DELTASONE  Take 4 tabs po x2d, then 3 tabs po x2d, then 2tab pox2d, then 1 tab po x2d and d/c     PROBIOTIC DAILY PO  Take 1 capsule by mouth daily.     timolol 0.5 % ophthalmic solution  Commonly known as:  TIMOPTIC  1 drop 2 (two) times daily.     Vitamin D3 1000 UNITS Caps  Take 1 capsule by mouth daily.         Discharged Condition: good  Physician Statement:     The Patient was personally examined, the discharge assessment and plan has been personally reviewed and I agree with ACNP Babcock's assessment and plan. > 30 minutes of time have been dedicated to discharge assessment, planning and discharge instructions.   Signed: BABCOCK,PETE 12/01/2012, 12:21 PM  I agree with this discharge plan.   Luisa Hart WrightMD

## 2012-12-01 NOTE — Progress Notes (Signed)
Patient stable at time of discharge. Removed IV. Reviewed discharge education with patient and family. They verbalized understanding. Patient left with her belongings and prescriptions.

## 2012-12-17 ENCOUNTER — Other Ambulatory Visit: Payer: Self-pay

## 2013-01-19 ENCOUNTER — Emergency Department (HOSPITAL_COMMUNITY)
Admission: EM | Admit: 2013-01-19 | Discharge: 2013-01-19 | Disposition: A | Discharge status: Home / Self Care | Payer: Medicare Other | Attending: Emergency Medicine | Admitting: Emergency Medicine

## 2013-01-19 ENCOUNTER — Encounter (HOSPITAL_COMMUNITY): Payer: Self-pay | Admitting: Emergency Medicine

## 2013-01-19 DIAGNOSIS — M129 Arthropathy, unspecified: Secondary | ICD-10-CM | POA: Insufficient documentation

## 2013-01-19 DIAGNOSIS — E785 Hyperlipidemia, unspecified: Secondary | ICD-10-CM | POA: Insufficient documentation

## 2013-01-19 DIAGNOSIS — Z79899 Other long term (current) drug therapy: Secondary | ICD-10-CM | POA: Insufficient documentation

## 2013-01-19 DIAGNOSIS — Z8669 Personal history of other diseases of the nervous system and sense organs: Secondary | ICD-10-CM | POA: Insufficient documentation

## 2013-01-19 DIAGNOSIS — I10 Essential (primary) hypertension: Secondary | ICD-10-CM | POA: Insufficient documentation

## 2013-01-19 DIAGNOSIS — Z8742 Personal history of other diseases of the female genital tract: Secondary | ICD-10-CM | POA: Insufficient documentation

## 2013-01-19 DIAGNOSIS — T783XXA Angioneurotic edema, initial encounter: Secondary | ICD-10-CM | POA: Insufficient documentation

## 2013-01-19 DIAGNOSIS — IMO0002 Reserved for concepts with insufficient information to code with codable children: Secondary | ICD-10-CM | POA: Insufficient documentation

## 2013-01-19 DIAGNOSIS — Z7982 Long term (current) use of aspirin: Secondary | ICD-10-CM | POA: Insufficient documentation

## 2013-01-19 LAB — CBC WITH DIFFERENTIAL/PLATELET
Eosinophils Absolute: 0.3 10*3/uL (ref 0.0–0.7)
Eosinophils Relative: 4 % (ref 0–5)
HCT: 39.2 % (ref 36.0–46.0)
Lymphocytes Relative: 30 % (ref 12–46)
Lymphs Abs: 2.1 10*3/uL (ref 0.7–4.0)
MCH: 32.5 pg (ref 26.0–34.0)
MCV: 91.6 fL (ref 78.0–100.0)
Monocytes Absolute: 0.6 10*3/uL (ref 0.1–1.0)
Monocytes Relative: 8 % (ref 3–12)
Platelets: 278 10*3/uL (ref 150–400)
RBC: 4.28 MIL/uL (ref 3.87–5.11)
WBC: 6.8 10*3/uL (ref 4.0–10.5)

## 2013-01-19 LAB — BASIC METABOLIC PANEL
BUN: 16 mg/dL (ref 6–23)
CO2: 28 mEq/L (ref 19–32)
Calcium: 10 mg/dL (ref 8.4–10.5)
Chloride: 96 mEq/L (ref 96–112)
Creatinine, Ser: 0.6 mg/dL (ref 0.50–1.10)
GFR calc non Af Amer: 84 mL/min — ABNORMAL LOW (ref >90)
Glucose, Bld: 135 mg/dL — ABNORMAL HIGH (ref 70–99)
Sodium: 136 mEq/L (ref 135–145)

## 2013-01-19 LAB — POCT I-STAT TROPONIN I

## 2013-01-19 MED ORDER — DIPHENHYDRAMINE HCL 50 MG/ML IJ SOLN
50.0000 mg | Freq: Once | INTRAMUSCULAR | Status: AC
  Administered 2013-01-19: 50 mg via INTRAVENOUS
  Filled 2013-01-19: qty 1

## 2013-01-19 MED ORDER — EPINEPHRINE 0.3 MG/0.3ML IJ SOAJ
0.3000 mg | Freq: Once | INTRAMUSCULAR | Status: AC
  Administered 2013-01-19: 0.3 mg via INTRAMUSCULAR
  Filled 2013-01-19: qty 0.3

## 2013-01-19 MED ORDER — METHYLPREDNISOLONE SODIUM SUCC 125 MG IJ SOLR
125.0000 mg | Freq: Once | INTRAMUSCULAR | Status: AC
  Administered 2013-01-19: 125 mg via INTRAVENOUS
  Filled 2013-01-19: qty 2

## 2013-01-19 MED ORDER — FAMOTIDINE IN NACL 20-0.9 MG/50ML-% IV SOLN
20.0000 mg | Freq: Once | INTRAVENOUS | Status: AC
  Administered 2013-01-19: 20 mg via INTRAVENOUS
  Filled 2013-01-19: qty 50

## 2013-01-19 MED ORDER — PREDNISONE 20 MG PO TABS: ORAL_TABLET | ORAL | Status: DC

## 2013-01-19 NOTE — ED Notes (Signed)
Pt reports to ED for facial swelling. Pt states that she was at [facility] and admitted for allergic rxn and throat swelling, was intubated, cause of rxn not found. Pt reports that MD suspected BP medications were cause of rxn, changed medications to cozaar 50 mg. Pt reports feeling dizzy, hot, flushed cheeks, racing heart, and taking a benadryl and an aspirin last night. Pt reports today with swollen tongue.

## 2013-01-19 NOTE — ED Provider Notes (Signed)
CSN: [id number]     Arrival date & time 01/19/13  1478 History   First MD Initiated Contact with Patient 01/19/13 [phone]     Chief Complaint  Patient presents with  . Allergic Reaction   CC: tongue swelling (Consider location/radiation/quality/duration/timing/severity/associated sxs/prior Treatment) HPI This 79 year old female has had multiple facial allergic type reactions intermittently over the years, most recently she was prophylactically intubated in October when she had swelling of her oropharynx, she is followed up with an allergist with unremarkable testing according to the patient, she was started on a new blood pressure medicine Cozaar a few days ago and last night had about 5 minutes of lightheadedness with no vertigo, and overnight she developed swelling to the right side of her tongue, last week she had some swelling to her lower lip, her lower lip is normal now, she had been on Cozaar years ago without a problem, she is no chest pain no cough no shortness of breath no stridor no drooling no voice change no abdominal pain no vomiting, she does have some slight left shoulder pain with certain movements of her left arm at the shoulder for over 12 hours without weakness or numbness to the arm, treatment prior to arrival consisted of Benadryl about 4 hours prior to arrival. Her tongue is slightly improved to compare to overnight. Her symptoms are mild. Past Medical History  Diagnosis Date  . Pelvic prolapse     anterior floor  . Hypertension   . Hyperlipidemia   . Arthritis   . Glaucoma of both eyes   . Urgency of urination   . Urinary hesitancy    Past Surgical History  Procedure Laterality Date  . Retinal detachment surgery Right   . Pubovaginal sling N/A 08/31/2012    Procedure: [employer] SCIENTIFIC UPHOLD LIGHT SACROSPINUS REPAIR;  Surgeon: Kathi Ludwig, MD;  Location: [facility];  Service: Urology;  Laterality: N/A;   History reviewed. No pertinent family  history. History  Substance Use Topics  . Smoking status: Never Smoker   . Smokeless tobacco: Never Used  . Alcohol Use: Yes     Comment: rare   OB History   Grav Para Term Preterm Abortions TAB SAB Ect Mult Living                 Review of Systems 10 Systems reviewed and are negative for acute change except as noted in the HPI. Allergies  Erythromycin  Home Medications   Current Outpatient Rx  Name  Route  Sig  Dispense  Refill  . aspirin 81 MG tablet   Oral   Take 81 mg by mouth daily.         . Calcium Carb-Cholecalciferol (CALCIUM 500 +D PO)   Oral   Take 1 tablet by mouth daily.         . cetirizine (ZYRTEC) 10 MG tablet   Oral   Take 10 mg by mouth daily.         . Cholecalciferol (VITAMIN D3) 1000 UNITS CAPS   Oral   Take 1 capsule by mouth daily.         . famotidine (PEPCID) 20 MG tablet      Take 1 tab twice a day for 6 days         . fish oil-omega-3 fatty acids 1000 MG capsule   Oral   Take 1 g by mouth daily.         Marland Kitchen glucosamine-chondroitin 500-400 MG tablet  Oral   Take 1 tablet by mouth daily.         Marland Kitchen losartan (COZAAR) 50 MG tablet   Oral   Take 1 tablet by mouth daily.         . montelukast (SINGULAIR) 10 MG tablet   Oral   Take 1 tablet by mouth daily.         . Multiple Vitamin (MULTIVITAMIN) tablet   Oral   Take 1 tablet by mouth daily.         . Probiotic Product (PROBIOTIC DAILY PO)   Oral   Take 1 capsule by mouth daily.         . timolol (TIMOPTIC) 0.5 % ophthalmic solution      1 drop 2 (two) times daily.         Marland Kitchen EPINEPHrine (EPIPEN) 0.3 mg/0.3 mL SOAJ injection   Intramuscular   Inject 0.3 mLs (0.3 mg total) into the muscle once.   1 Device   6   . predniSONE (DELTASONE) 20 MG tablet      2 tabs po daily x 3 days   6 tablet   0    BP 129/65  Pulse 97  Temp(Src) 98 F (36.7 C) (Oral)  Resp 16  SpO2 96% Physical Exam  Nursing note and vitals reviewed. Constitutional:   Awake, alert, nontoxic appearance.  HENT:  Head: Atraumatic.  Oropharynx shows moist mucosa, the right half of the tongue is mild edema to the left half of the tongue is normal, posterior pharynx is partially visualized with no injection or edema noted; there is no stridor no drooling no airway compromise, speech is normal  Eyes: Right eye exhibits no discharge. Left eye exhibits no discharge.  Neck: Neck supple.  Cardiovascular: Normal rate and regular rhythm.   No murmur heard. Pulmonary/Chest: Effort normal and breath sounds normal. No respiratory distress. She has no wheezes. She has no rales. She exhibits no tenderness.  Pulse oximetry normal on room air at 99%  Abdominal: Soft. There is no tenderness. There is no rebound.  Musculoskeletal: She exhibits no edema and no tenderness.  Baseline ROM, no obvious new focal weakness.  Neurological: She is alert.  Mental status and motor strength appears baseline for patient and situation.  Skin: No rash noted.  Psychiatric: She has a normal mood and affect.    ED Course  Procedures (including critical care time) Patient / Family / Caregiver understand and agree with initial ED impression and plan with expectations set for ED visit; will treat for possible allergic reaction and observe Pt in the ED. 0920 Pt stable in ED with no significant deterioration in condition.1030 Tongue swelling is much improved and essentially back to baseline. 1230 Labs Review Labs Reviewed  BASIC METABOLIC PANEL - Abnormal; Notable for the following:    Glucose, Bld 135 (*)    GFR calc non Af Amer 84 (*)    All other components within normal limits  CBC WITH DIFFERENTIAL  POCT I-STAT TROPONIN I   Imaging Review No results found.  EKG Interpretation    Date/Time:  Tuesday January 19 2013 09:20:30 EST Ventricular Rate:  84 PR Interval:  137 QRS Duration: 93 QT Interval:  408 QTC Calculation: 482 R Axis:   24 Text Interpretation:  Sinus rhythm  Ventricular premature complex RSR' in V1 or V2, probably normal variant Borderline T abnormalities, inferior leads No significant change was found Confirmed by CAMPOS  MD, KEVIN ([id number]) on 01/19/2013 10:02:47 AM  MDM   1. Angioedema, initial encounter    I doubt any other EMC precluding discharge at this time including, but not necessarily limited to the following:ongoing anaphylaxis.    Hurman Horn, MD 01/21/13 [phone]

## 2013-01-19 NOTE — ED Notes (Signed)
MD at bedside. 

## 2013-08-03 ENCOUNTER — Ambulatory Visit (INDEPENDENT_AMBULATORY_CARE_PROVIDER_SITE_OTHER): Payer: Medicare Other | Admitting: Endocrinology

## 2013-08-03 ENCOUNTER — Encounter: Payer: Self-pay | Admitting: Endocrinology

## 2013-08-03 VITALS — BP 132/82 | HR 73 | Resp 16 | Ht 64.25 in | Wt 125.4 lb

## 2013-08-03 DIAGNOSIS — E0590 Thyrotoxicosis, unspecified without thyrotoxic crisis or storm: Secondary | ICD-10-CM

## 2013-08-03 NOTE — Progress Notes (Signed)
Patient ID: Emily Robertson, female   DOB: 1930/12/12, 80 y.o.   MRN: [id number]                                                                                               Reason for Appointment:  Hyperthyroidism, new consultation  Referring physician: Zachery Dauer   History of Present Illness:   Was being evaluated by her PCP for episode of syncope on 07/24/13 She had been complaining about feeling significantly tired for about a year Her TSH test was done as screening and this was found to be <0.01. In 11/2013 her TSH level was normal at 2.65 She does not complain of any symptoms of palpitations, shakiness, feeling excessively warm and sweaty, nervousness or decreased appetite  The patient has lost about 4-5 lbs since about last year for no apparent reason   The patient was evaluated with thyroid function tests which showed the following:     No results found for this basename: freeT4, TSH    Free T4 = 2.08 and free T3 is high at 4.22 She is now referred here for further management     Medication List       This list is accurate as of: 08/03/13  9:10 AM.  Always use your most recent med list.               aspirin 81 MG tablet  Take 81 mg by mouth daily.     atenolol 50 MG tablet  Commonly known as:  TENORMIN     CALCIUM 500 +D PO  Take 1 tablet by mouth daily.     cetirizine 10 MG tablet  Commonly known as:  ZYRTEC  Take 10 mg by mouth daily.     EPINEPHrine 0.3 mg/0.3 mL Soaj injection  Commonly known as:  EPIPEN  Inject 0.3 mLs (0.3 mg total) into the muscle once.     famotidine 20 MG tablet  Commonly known as:  PEPCID  Take 1 tab twice a day for 6 days     fish oil-omega-3 fatty acids 1000 MG capsule  Take 1 g by mouth daily.     glucosamine-chondroitin 500-400 MG tablet  Take 1 tablet by mouth daily.     losartan 50 MG tablet  Commonly known as:  COZAAR  Take 1 tablet by mouth daily.     montelukast 10 MG tablet  Commonly known as:   SINGULAIR  Take 1 tablet by mouth daily.     multivitamin tablet  Take 1 tablet by mouth daily.     predniSONE 20 MG tablet  Commonly known as:  DELTASONE  2 tabs po daily x 3 days     PROBIOTIC DAILY PO  Take 1 capsule by mouth daily.     timolol 0.5 % ophthalmic solution  Commonly known as:  TIMOPTIC  1 drop 2 (two) times daily.     Vitamin D3 1000 UNITS Caps  Take 1 capsule by mouth daily.            Past Medical History  Diagnosis Date  .  Pelvic prolapse     anterior floor  . Hypertension   . Hyperlipidemia   . Arthritis   . Glaucoma of both eyes   . Urgency of urination   . Urinary hesitancy     Past Surgical History  Procedure Laterality Date  . Retinal detachment surgery Right   . Pubovaginal sling N/A 08/31/2012    Procedure: [employer]IFIC UPHOLD LIGHT SACROSPINUS REPAIR;  Surgeon: Kathi Ludwig, MD;  Location: [facility];  Service: Urology;  Laterality: N/A;    No family history on file.  Social History:  reports that she has never smoked. She has never used smokeless tobacco. She reports that she drinks alcohol. She reports that she does not use illicit drugs.  Allergies:  Allergies  Allergen Reactions  . Erythromycin Itching    Review of Systems:  Has a long history of high blood pressure.  In 2014 was on Tenoretic but more recently has been on atenolol alone        No history of dyspnea on exertion.      No complaints of change in bowel habits.      Negative  history of Diabetes.     No swelling of feet     Had nocturia 3 times, history of bladder problems  No history of chest pain     Examination:   BP 132/82  Pulse 73  Resp 16  Ht 5' 4.25" (1.632 m)  Wt 125 lb 6.4 oz (56.881 kg)  BMI 21.36 kg/m2  SpO2 96%   General Appearance:  well-built and nourished, pleasant, not anxious or hyperkinetic..        Eyes: No unusual prominence, lid lag or stare. No swelling of the eyelids  Neck: The thyroid is not  significantly large, has a fullness on the right lobe but no distinct nodule felt. Left side is not palpable There is no lymphadenopathy .          Heart: normal S1 and S2, no murmurs .         Lungs: breath sounds are clear bilaterally  Extremities: hands are normal to touch. No ankle edema. Neurological: REFLEXES: at biceps are nearly normal .   TREMORS:  absent   Assessment/Plan:   Hyperthyroidism, Likely to be from Graves' disease  She appears to have minimal symptoms except for fatigue and mild weight loss despite a good appetite Some of her symptoms may be masked because of her taking atenolol and also because of her age Also does not have a significant thyroid enlargement on exam and no clinical signs of hyperthyroidism   Discussed with the patient through the different causes of hyperthyroidism including autoimmune thyroid disease and nodular goiter. Explained the options for treatment including antithyroid drugs and radioactive iodine. Discussed the pros and cons for each treatment: Antithyroid drugs would be reasonable for mild disease but would need frequent followup and monitoring as well as potential for side effects from the medications and uncertainty about long-term cure of the problem Discussed that I-131 treatment is safe and simple to do but will result in long-term hypothyroidism that will result from I-131 treatment and the need for lifelong supplementation Patient understands the above discussion and treatment options.   We will first evaluate her thyroid with a nuclear scan and uptake and if she has a diffuse goiter may consider antithyroid drugs or I-131 based on patient preference Followup in 4 weeks  [name] 08/03/2013, 9:10 AM

## 2013-08-18 ENCOUNTER — Telehealth: Payer: Self-pay | Admitting: Endocrinology

## 2013-08-18 NOTE — Telephone Encounter (Signed)
Please see below and advise.

## 2013-08-18 NOTE — Telephone Encounter (Signed)
Patient said she has not had the procedure done yet, I called the [facility] who said they were pretty behind on everything but she would call now to get it scheduled. Do you still want to see her next week?

## 2013-08-18 NOTE — Telephone Encounter (Signed)
Patient stated that she treated for overactive thyroid two weeks ago, she would  like to talk to Dr to see what treatment is best for her.  Thank you

## 2013-08-18 NOTE — Telephone Encounter (Signed)
No, 4 wks after Rx

## 2013-08-18 NOTE — Telephone Encounter (Signed)
It takes 4-6 weeks for the radioactive iodine to work completely, she is already scheduled to see me next week and we can discuss treatment based on lab results

## 2013-08-26 ENCOUNTER — Other Ambulatory Visit: Payer: Medicare Other

## 2013-08-26 ENCOUNTER — Encounter (HOSPITAL_COMMUNITY)
Admission: RE | Admit: 2013-08-26 | Discharge: 2013-08-26 | Disposition: A | Discharge status: Home / Self Care | Payer: Medicare Other | Source: Ambulatory Visit | Attending: Endocrinology | Admitting: Endocrinology

## 2013-08-26 DIAGNOSIS — E0590 Thyrotoxicosis, unspecified without thyrotoxic crisis or storm: Secondary | ICD-10-CM | POA: Insufficient documentation

## 2013-08-27 ENCOUNTER — Encounter (HOSPITAL_COMMUNITY)
Admission: RE | Admit: 2013-08-27 | Discharge: 2013-08-27 | Disposition: A | Discharge status: Home / Self Care | Payer: Medicare Other | Source: Ambulatory Visit | Attending: Endocrinology | Admitting: Endocrinology

## 2013-08-27 MED ORDER — SODIUM PERTECHNETATE TC 99M INJECTION
10.0000 | Freq: Once | INTRAVENOUS | Status: AC | PRN
  Administered 2013-08-27: 10 via INTRAVENOUS

## 2013-08-29 NOTE — Progress Notes (Signed)
Quick Note:  She is supposed to be seen in followup for thyroid and review of uptake, needs to reschedule appointment ______

## 2013-08-31 ENCOUNTER — Ambulatory Visit: Payer: Medicare Other | Admitting: Endocrinology

## 2013-09-01 ENCOUNTER — Encounter: Payer: Self-pay | Admitting: Endocrinology

## 2013-09-01 ENCOUNTER — Ambulatory Visit (INDEPENDENT_AMBULATORY_CARE_PROVIDER_SITE_OTHER): Payer: Medicare Other | Admitting: Endocrinology

## 2013-09-01 VITALS — BP 131/63 | HR 65 | Temp 98.3°F | Resp 14 | Ht 64.5 in | Wt 126.4 lb

## 2013-09-01 DIAGNOSIS — E0590 Thyrotoxicosis, unspecified without thyrotoxic crisis or storm: Secondary | ICD-10-CM

## 2013-09-01 LAB — T3, FREE: T3 FREE: 2.5 pg/mL (ref 2.3–4.2)

## 2013-09-01 LAB — T4, FREE: FREE T4: 0.82 ng/dL (ref 0.60–1.60)

## 2013-09-01 NOTE — Progress Notes (Signed)
Patient ID: Emily Robertson, female   DOB: 04-Jun-1930, 80 y.o.   MRN: [id number]                                                                                               Reason for Appointment:  Hyperthyroidism follow up  Referring physician: Zachery Dauer   History of Present Illness:   She was being evaluated by her PCP for episode of syncope on 07/24/13 and had been complaining about feeling significantly tired for about a year Her TSH test was done as screening and this was found to be <0.01 and free T4 and free T3 were significantly high. In 11/2012 her TSH level was normal at 2.65 She did not have any symptoms of palpitations, shakiness, feeling excessively warm and sweaty, nervousness or decreased appetite  The patient had lost about 4-5 lbs since about last year for no apparent reason No history of neck pain or thyroid tenderness on exam  She was evaluated with a nuclear thyroid scan and uptake which showed that her uptake was only 1.4% She is now for followup. She thinks she is still feeling tired but her weight has leveled off  Wt Readings from Last 3 Encounters:  09/01/13 126 lb 6.4 oz (57.335 kg)  08/03/13 125 lb 6.4 oz (56.881 kg)  11/30/12 133 lb (60.328 kg)         Medication List       This list is accurate as of: 09/01/13  1:41 PM.  Always use your most recent med list.               aspirin 81 MG tablet  Take 81 mg by mouth daily.     atenolol 50 MG tablet  Commonly known as:  TENORMIN     CALCIUM 500 +D PO  Take 1 tablet by mouth daily.     cetirizine 10 MG tablet  Commonly known as:  ZYRTEC  Take 10 mg by mouth daily.     EPINEPHrine 0.3 mg/0.3 mL Soaj injection  Commonly known as:  EPIPEN  Inject 0.3 mLs (0.3 mg total) into the muscle once.     famotidine 20 MG tablet  Commonly known as:  PEPCID  Take 20 mg by mouth 2 (two) times daily.     fish oil-omega-3 fatty acids 1000 MG capsule  Take 1 g by mouth daily.     glucosamine-chondroitin 500-400 MG tablet  Take 1 tablet by mouth daily.     montelukast 10 MG tablet  Commonly known as:  SINGULAIR  Take 1 tablet by mouth daily.     multivitamin tablet  Take 1 tablet by mouth daily.     PROBIOTIC DAILY PO  Take 1 capsule by mouth daily.     timolol 0.5 % ophthalmic solution  Commonly known as:  TIMOPTIC  1 drop 2 (two) times daily.     vitamin C 500 MG tablet  Commonly known as:  ASCORBIC ACID  Take 500 mg by mouth daily.     Vitamin D3 1000 UNITS Caps  Take 1 capsule by  mouth daily.            Past Medical History  Diagnosis Date  . Pelvic prolapse     anterior floor  . Hypertension   . Hyperlipidemia   . Arthritis   . Glaucoma of both eyes   . Urgency of urination   . Urinary hesitancy     Past Surgical History  Procedure Laterality Date  . Retinal detachment surgery Right   . Pubovaginal sling N/A 08/31/2012    Procedure: BOSTON SCIENTIFIC UPHOLD LIGHT SACROSPINUS REPAIR;  Surgeon: Kathi Ludwig, MD;  Location: [facility];  Service: Urology;  Laterality: N/A;    Family History  Problem Relation Age of Onset  . Hypertension Mother   . Thyroid disease Neg Hx   . Diabetes Neg Hx     Social History:  reports that she has never smoked. She has never used smokeless tobacco. She reports that she drinks alcohol. She reports that she does not use illicit drugs.  Allergies:  Allergies  Allergen Reactions  . Erythromycin Itching    Review of Systems:  Has a long history of high blood pressure.  In 2014 was on Tenoretic but more recently has been on atenolol alone          Examination:   BP 131/63  Pulse 65  Temp(Src) 98.3 F (36.8 C)  Resp 14  Ht 5' 4.5" (1.638 m)  Wt 126 lb 6.4 oz (57.335 kg)  BMI 21.37 kg/m2  SpO2 97%   General Appearance:  she looks well       Eyes: No swelling of the eyelids  Neck: The thyroid is not palpable Neurological: REFLEXES: at biceps are normal .   TREMORS:  None   Assessment/Plan:   Hyperthyroidism with low I-131 uptake She may have had silent thyroiditis and there is no history of iodine exposure or or recent contrast dye injections Also despite her high free T4 and free T3 she was having minimal symptoms except for fatigue and mild weight loss despite a good appetite Some of her signs may be masked because of her taking atenolol and also because of her age Today she does not have any thyroid enlargement Will check her thyroid levels and decide on further management  Barlow Harrison 09/01/2013, 1:41 PM   Addendum: Thyroid levels are quite normal now supporting the diagnosis of silent thyroiditis. No further evaluation needed and she can followup with her PCP  Office Visit on 09/01/2013  Component Date Value Ref Range Status  . Free T4 09/01/2013 0.82  0.60 - 1.60 ng/dL Final  . T3, Free 13/24/4010 2.5  2.3 - 4.2 pg/mL Final

## 2013-09-01 NOTE — Progress Notes (Signed)
Quick Note:  Please let patient know that the lab result is back to normal and no further action needed, continue followup with primary care physician ______

## 2013-10-13 ENCOUNTER — Ambulatory Visit: Payer: Medicare Other | Admitting: Endocrinology

## 2015-10-30 ENCOUNTER — Other Ambulatory Visit: Payer: Self-pay

## 2015-11-01 ENCOUNTER — Other Ambulatory Visit: Payer: Self-pay

## 2016-07-24 ENCOUNTER — Emergency Department (HOSPITAL_COMMUNITY)
Admission: EM | Admit: 2016-07-24 | Discharge: 2016-07-24 | Disposition: A | Discharge status: Home / Self Care | Payer: Medicare Other | Attending: Emergency Medicine | Admitting: Emergency Medicine

## 2016-07-24 ENCOUNTER — Encounter (HOSPITAL_COMMUNITY): Payer: Self-pay | Admitting: *Deleted

## 2016-07-24 ENCOUNTER — Emergency Department (HOSPITAL_COMMUNITY): Payer: Medicare Other

## 2016-07-24 DIAGNOSIS — Y999 Unspecified external cause status: Secondary | ICD-10-CM | POA: Diagnosis not present

## 2016-07-24 DIAGNOSIS — Y939 Activity, unspecified: Secondary | ICD-10-CM | POA: Insufficient documentation

## 2016-07-24 DIAGNOSIS — Z79899 Other long term (current) drug therapy: Secondary | ICD-10-CM | POA: Insufficient documentation

## 2016-07-24 DIAGNOSIS — S79911A Unspecified injury of right hip, initial encounter: Secondary | ICD-10-CM | POA: Diagnosis present

## 2016-07-24 DIAGNOSIS — Y92010 Kitchen of single-family (private) house as the place of occurrence of the external cause: Secondary | ICD-10-CM | POA: Insufficient documentation

## 2016-07-24 DIAGNOSIS — M2550 Pain in unspecified joint: Secondary | ICD-10-CM | POA: Diagnosis not present

## 2016-07-24 DIAGNOSIS — Z7982 Long term (current) use of aspirin: Secondary | ICD-10-CM | POA: Insufficient documentation

## 2016-07-24 DIAGNOSIS — X509XXA Other and unspecified overexertion or strenuous movements or postures, initial encounter: Secondary | ICD-10-CM | POA: Insufficient documentation

## 2016-07-24 DIAGNOSIS — I10 Essential (primary) hypertension: Secondary | ICD-10-CM | POA: Diagnosis not present

## 2016-07-24 DIAGNOSIS — M533 Sacrococcygeal disorders, not elsewhere classified: Secondary | ICD-10-CM

## 2016-07-24 MED ORDER — ACETAMINOPHEN 500 MG PO TABS
1000.0000 mg | ORAL_TABLET | Freq: Once | ORAL | Status: AC
  Administered 2016-07-24: 1000 mg via ORAL
  Filled 2016-07-24: qty 2

## 2016-07-24 MED ORDER — TRAMADOL HCL 50 MG PO TABS: ORAL_TABLET | ORAL | 0 refills | Status: DC

## 2016-07-24 MED ORDER — TRAMADOL HCL 50 MG PO TABS
50.0000 mg | ORAL_TABLET | Freq: Once | ORAL | Status: AC
  Administered 2016-07-24: 50 mg via ORAL
  Filled 2016-07-24: qty 1

## 2016-07-24 MED ORDER — PREDNISONE 20 MG PO TABS: ORAL_TABLET | ORAL | 0 refills | Status: DC

## 2016-07-24 MED ORDER — ACETAMINOPHEN 500 MG PO TABS: 1000.0000 mg | ORAL_TABLET | Freq: Four times a day (QID) | ORAL | 0 refills | Status: DC | PRN

## 2016-07-24 MED ORDER — PREDNISONE 20 MG PO TABS
40.0000 mg | ORAL_TABLET | Freq: Once | ORAL | Status: AC
  Administered 2016-07-24: 40 mg via ORAL
  Filled 2016-07-24: qty 2

## 2016-07-24 NOTE — ED Provider Notes (Signed)
[facility]-EMERGENCY DEPT Provider Note   CSN: [id number] Arrival date & time: 07/24/16  0751     History   Chief Complaint Chief Complaint  Patient presents with  . Hip Pain    right side    HPI Emily Robertson is a 83 y.o. female.  HPI Patient was bending over to get something out of the lower portion of her refrigerator yesterday evening. She had a sudden and severe pain in her right lower back, she indicates the SI region. She reports is very painful trying to walk or turn. She is able to get into a comfortable position lying down. No radiation of pain, no weakness, no numbness, no loss of function of the leg. She tried Aleve yesterday evening without significant relief. Past Medical History:  Diagnosis Date  . Arthritis   . Glaucoma of both eyes   . Hyperlipidemia   . Hypertension   . Pelvic prolapse    anterior floor  . Urgency of urination   . Urinary hesitancy     Patient Active Problem List   Diagnosis Date Noted  . Hyperthyroidism 08/03/2013  . Angioedema 11/29/2012  . HTN (hypertension) 11/29/2012    Past Surgical History:  Procedure Laterality Date  . PUBOVAGINAL SLING N/A 08/31/2012   Procedure: BOSTON SCIENTIFIC UPHOLD LIGHT SACROSPINUS REPAIR;  Surgeon: Kathi Ludwig, MD;  Location: [facility];  Service: Urology;  Laterality: N/A;  . RETINAL DETACHMENT SURGERY Right     OB History    No data available       Home Medications    Prior to Admission medications   Medication Sig Start Date End Date Taking? Authorizing Provider  acetaminophen (TYLENOL) 500 MG tablet Take 2 tablets (1,000 mg total) by mouth every 6 (six) hours as needed. 07/24/16   Arby Barrette, MD  aspirin 81 MG tablet Take 81 mg by mouth daily.    [provider]  atenolol (TENORMIN) 50 MG tablet  06/04/13   [provider]  Calcium Carb-Cholecalciferol (CALCIUM 500 +D PO) Take 1 tablet by mouth daily.    [provider]  cetirizine  (ZYRTEC) 10 MG tablet Take 10 mg by mouth daily.    [provider]  Cholecalciferol (VITAMIN D3) 1000 UNITS CAPS Take 1 capsule by mouth daily.    [provider]  EPINEPHrine (EPIPEN) 0.3 mg/0.3 mL SOAJ injection Inject 0.3 mLs (0.3 mg total) into the muscle once. 12/01/12   Simonne Martinet, NP  famotidine (PEPCID) 20 MG tablet Take 20 mg by mouth 2 (two) times daily.    [provider]  fish oil-omega-3 fatty acids 1000 MG capsule Take 1 g by mouth daily.    [provider]  glucosamine-chondroitin 500-400 MG tablet Take 1 tablet by mouth daily.    [provider]  montelukast (SINGULAIR) 10 MG tablet Take 1 tablet by mouth daily. 01/01/13   [provider]  Multiple Vitamin (MULTIVITAMIN) tablet Take 1 tablet by mouth daily.    [provider]  predniSONE (DELTASONE) 20 MG tablet 2 tabs po daily x 4 days 07/24/16   Arby Barrette, MD  Probiotic Product (PROBIOTIC DAILY PO) Take 1 capsule by mouth daily.    [provider]  timolol (TIMOPTIC) 0.5 % ophthalmic solution 1 drop 2 (two) times daily.    [provider]  traMADol (ULTRAM) 50 MG tablet 1-2 tablets every 6 hours as needed for pain. May take with acetaminophen. 07/24/16   Arby Barrette, MD  vitamin C (ASCORBIC ACID) 500 MG tablet Take 500 mg by mouth daily.    [provider]    Family History Family History  Problem Relation Age of Onset  . Hypertension Mother   . Thyroid disease Neg Hx   . Diabetes Neg Hx     Social History Social History  Substance Use Topics  . Smoking status: Never Smoker  . Smokeless tobacco: Never Used  . Alcohol use Yes     Comment: rare     Allergies   Erythromycin   Review of Systems Review of Systems Constitutional: No recent fever chills or general illness GI: No abdominal pain nausea vomiting or diarrhea GU: No pain burning or urgency with urination.  Physical Exam Updated Vital Signs BP (!)  172/142 (BP Location: Right Arm)   Pulse 90   Temp 97.5 F (36.4 C) (Oral)   Resp (!) 22   Ht 5\' 5"  (1.651 m)   Wt 57.2 kg (126 lb)   SpO2 100%   BMI 20.97 kg/m   Physical Exam  Constitutional: She is oriented to person, place, and time. She appears well-developed and well-nourished. No distress.  HENT:  Head: Normocephalic and atraumatic.  Eyes: Conjunctivae are normal.  Neck: Neck supple.  Cardiovascular: Normal rate and regular rhythm.   No murmur heard. Pulmonary/Chest: Effort normal and breath sounds normal. No respiratory distress.  Abdominal: Soft. She exhibits no distension. There is no tenderness. There is no guarding.  Musculoskeletal: She exhibits no edema.  Patient points of pain in the SI joint on the right. Pain is not significant reproducible by palpation. Is reproducible by forward flexion and twisting Normal straight leg raise bilaterally. Normal flexion and extension of the lower extremities. No peripheral edema.  Neurological: She is alert and oriented to person, place, and time. No cranial nerve deficit. She exhibits normal muscle tone. Coordination normal.  Lower extremity motor strength 5\5 normal sensation to light touch.  Skin: Skin is warm and dry.  Psychiatric: She has a normal mood and affect.  Nursing note and vitals reviewed.    ED Treatments / Results  Labs (all labs ordered are listed, but only abnormal results are displayed) Labs Reviewed - No data to display  EKG  EKG Interpretation None       Radiology Dg Lumbar Spine 2-3 Views  Result Date: 07/24/2016 CLINICAL DATA:  Low back pain.  Right SI pain EXAM: LUMBAR SPINE - 2-3 VIEW COMPARISON:  07/11/2013 FINDINGS: Moderate levoscoliosis L3. Normal sagittal alignment. Negative for fracture or mass Disc degeneration and spurring on the right L1-2 and L2-3. Disc degeneration and spurring on the left at L4-5. Atherosclerotic aorta Mild degenerative change in the right SI joint. Negative for  fracture or erosion. IMPRESSION: Lumbar scoliosis and degenerative changes above. No acute skeletal abnormality. Atherosclerotic aorta. Electronically Signed   By: 07/13/2013 M.D.   On: 07/24/2016 09:08    Procedures Procedures (including critical care time)  Medications Ordered in ED Medications  predniSONE (DELTASONE) tablet 40 mg (40 mg Oral Given 07/24/16 0832)  traMADol (ULTRAM) tablet 50 mg (50 mg Oral Given 07/24/16 0832)  acetaminophen (TYLENOL) tablet 1,000 mg (1,000 mg Oral Given 07/24/16 07/26/16)     Initial Impression / Assessment and Plan / ED Course  I have reviewed the triage vital signs and the nursing notes.  Pertinent labs & imaging results that were available during my care of the patient were reviewed by me and considered in my medical decision making (  see chart for details).     Final Clinical Impressions(s) / ED Diagnoses   Final diagnoses:  Sacroiliac joint pain  Pain is of acute onset with a bending over motion. Pain localizes to the SI joint. No associated neurologic symptoms or dysfunction. At this time will give 4 course of prednisone for local inflammation, tramadol and acetaminophen for pain. Patient counseled on follow-up with PCP to assess response to treatment.  New Prescriptions New Prescriptions   ACETAMINOPHEN (TYLENOL) 500 MG TABLET    Take 2 tablets (1,000 mg total) by mouth every 6 (six) hours as needed.   PREDNISONE (DELTASONE) 20 MG TABLET    2 tabs po daily x 4 days   TRAMADOL (ULTRAM) 50 MG TABLET    1-2 tablets every 6 hours as needed for pain. May take with acetaminophen.     Arby Barrette, MD 07/24/16 [phone]

## 2016-07-24 NOTE — ED Triage Notes (Signed)
Patient is alert and oriented x4.  She is complaining of right hip pain that started last night.  Patient denies any trauma to the hip.  Currently she rates her pain 8 of 10.

## 2019-04-15 ENCOUNTER — Ambulatory Visit: Payer: Medicare PPO | Attending: Internal Medicine

## 2019-04-15 DIAGNOSIS — Z23 Encounter for immunization: Secondary | ICD-10-CM | POA: Insufficient documentation

## 2019-04-15 NOTE — Progress Notes (Signed)
   Covid-19 Vaccination Clinic  Name:  Emily Robertson    MRN: [id number] DOB: Nov 22, 1930  04/15/2019  Emily Robertson was observed post Covid-19 immunization for 15 minutes without incident. She was provided with Vaccine Information Sheet and instruction to access the V-Safe system.   Emily Robertson was instructed to call 911 with any severe reactions post vaccine: Marland Kitchen Difficulty breathing  . Swelling of face and throat  . A fast heartbeat  . A bad rash all over body  . Dizziness and weakness

## 2019-05-12 ENCOUNTER — Ambulatory Visit: Payer: Medicare PPO | Attending: Internal Medicine

## 2019-05-12 DIAGNOSIS — Z23 Encounter for immunization: Secondary | ICD-10-CM

## 2019-05-12 NOTE — Progress Notes (Signed)
   Covid-19 Vaccination Clinic  Name:  Emily Robertson    MRN: [id number] DOB: Jun 13, 1930  05/12/2019  [name] was observed post Covid-19 immunization for 15 minutes without incident. She was provided with Vaccine Information Sheet and instruction to access the V-Safe system.   [name] was instructed to call 911 with any severe reactions post vaccine: Marland Kitchen Difficulty breathing  . Swelling of face and throat  . A fast heartbeat  . A bad rash all over body  . Dizziness and weakness   Immunizations Administered    Name Date Dose VIS Date Route   Pfizer COVID-19 Vaccine 05/12/2019 10:54 AM 0.3 mL 01/22/2019 Intramuscular   Manufacturer: [employer], [employer]   Lot: [id number]   NDC: [address]-4

## 2019-08-04 ENCOUNTER — Other Ambulatory Visit: Payer: Self-pay

## 2019-08-04 ENCOUNTER — Ambulatory Visit (HOSPITAL_COMMUNITY)
Admission: RE | Admit: 2019-08-04 | Discharge: 2019-08-04 | Disposition: A | Discharge status: Home / Self Care | Payer: Medicare PPO | Source: Ambulatory Visit | Attending: Cardiology | Admitting: Cardiology

## 2019-08-04 ENCOUNTER — Other Ambulatory Visit (HOSPITAL_COMMUNITY): Payer: Self-pay | Admitting: Family Medicine

## 2019-08-04 DIAGNOSIS — I739 Peripheral vascular disease, unspecified: Secondary | ICD-10-CM | POA: Diagnosis present

## 2019-09-03 DIAGNOSIS — H401122 Primary open-angle glaucoma, left eye, moderate stage: Secondary | ICD-10-CM | POA: Diagnosis not present

## 2019-09-03 DIAGNOSIS — H401113 Primary open-angle glaucoma, right eye, severe stage: Secondary | ICD-10-CM | POA: Diagnosis not present

## 2019-09-07 DIAGNOSIS — R21 Rash and other nonspecific skin eruption: Secondary | ICD-10-CM | POA: Diagnosis not present

## 2019-09-23 DIAGNOSIS — L309 Dermatitis, unspecified: Secondary | ICD-10-CM | POA: Diagnosis not present

## 2019-09-23 DIAGNOSIS — Z87892 Personal history of anaphylaxis: Secondary | ICD-10-CM | POA: Diagnosis not present

## 2019-10-27 DIAGNOSIS — L508 Other urticaria: Secondary | ICD-10-CM | POA: Diagnosis not present

## 2019-10-28 ENCOUNTER — Other Ambulatory Visit: Payer: Self-pay

## 2019-10-28 ENCOUNTER — Ambulatory Visit: Payer: Medicare PPO | Admitting: Allergy

## 2019-10-28 ENCOUNTER — Encounter: Payer: Self-pay | Admitting: Allergy

## 2019-10-28 VITALS — BP 150/80 | HR 79 | Temp 98.6°F | Resp 18 | Ht 62.0 in | Wt 123.4 lb

## 2019-10-28 DIAGNOSIS — T783XXD Angioneurotic edema, subsequent encounter: Secondary | ICD-10-CM

## 2019-10-28 DIAGNOSIS — S90862D Insect bite (nonvenomous), left foot, subsequent encounter: Secondary | ICD-10-CM

## 2019-10-28 DIAGNOSIS — W57XXXD Bitten or stung by nonvenomous insect and other nonvenomous arthropods, subsequent encounter: Secondary | ICD-10-CM | POA: Diagnosis not present

## 2019-10-28 DIAGNOSIS — L508 Other urticaria: Secondary | ICD-10-CM

## 2019-10-28 MED ORDER — FAMOTIDINE 20 MG PO TABS: 20.0000 mg | ORAL_TABLET | Freq: Two times a day (BID) | ORAL | 3 refills | Status: DC

## 2019-10-28 MED ORDER — CETIRIZINE HCL 10 MG PO TABS: 10.0000 mg | ORAL_TABLET | Freq: Two times a day (BID) | ORAL | 3 refills | Status: DC

## 2019-10-28 MED ORDER — MONTELUKAST SODIUM 10 MG PO TABS: 10.0000 mg | ORAL_TABLET | Freq: Every day | ORAL | 3 refills | Status: DC

## 2019-10-28 NOTE — Patient Instructions (Addendum)
Hives (itchy rash) and swelling  - at this time etiology of hives and swelling is unknown.  Hives and swelling can be caused by a variety of different triggers including illness/infection, foods, medications, stings, exercise, pressure, vibrations, extremes of temperature to name a few however majority of the time there is no identifiable trigger.  Your symptoms have been ongoing for >6 weeks making this chronic and is now recurrent thus will obtain labwork to evaluate: CBC w diff, CMP, tryptase, hive panel, environmental panel, alpha-gal panel, nut panel and fire ant IgE  - for management of hives and swelling recommend the following regimen: Zyrtec 10mg  1 tab twice a day, Pepcid 20mg  1 tab twice a day and Singulair 10mg  1 tab once a day  - reserve benadryl for breakthrough symptoms  - if symptoms continue would recommend trial of Xolair monthly injections for better control of hives and swelling if high-dose antihistamine regimen is not effective enough  - you have access to self-injectable epinephrine Epipen 0.3mg  at all times. Follow emergency action plan in case of allergic reaction.   - recommend use of non-scented and fragrance free body products like Dove white bar soap  - complete prednisone course as directed by your PCP  Follow-up in 2-3 months or sooner if needed

## 2019-10-28 NOTE — Progress Notes (Signed)
New Patient Note  RE: Emily Robertson MRN: [id number] DOB: 06/18/1930 Date of Office Visit: 10/28/2019  Referring provider: Juluis Rainier, MD Primary care provider: Juluis Rainier, MD  Chief Complaint: itching and swelling  History of present illness: Emily Robertson is a 86 y.o. female presenting today for consultation for pruritus and angioedema.   This weekend she states she had lip swelling.  She took a benadryl for this.  Tuesday night of this week she had tongue swelling and also felt nauseated.  She states she took her antihistamine and this helped.  She went to see her PCP the next day and she received a steroid injection and prednisone tabs for 10 days.  She states she is feeling better today and has not had any itching or further swelling.   She states she has been dealing with this for past 3 months.  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  She then states she had some incident with her knee where it started oozing. She saw her PCP in regards to this.  She states she is not sure what bit her as she didn't initially feel anything.  But since this she has been having itching and the swelling.  She states with the itching she is seeing little red bumps that would come and go daily.  No increase in joint aches/pains. No fever.  No preceding illnesses. No change in medications.   She has been taking zyrtec, pepcid and singulair daily since around 2014.  Her PCP recommended she increase pepcid to twice a day yesterday.  She has had issues with swelling back in 2014. She did not have any issues until the past 3 months.    She does eat red meat in diet.  She states her diet has has not changed.  She uses Marice Potter that is scented and uses Tide detergent; these things have not changed.    She states she did eat some walnuts over the weekend prior to onset of the swelling but states she would eat nuts  occasionally without issue.    She has a UTD epipen.      Review of systems: Review of Systems  Constitutional: Negative.   HENT: Negative.   Eyes: Negative.   Respiratory: Negative.   Cardiovascular: Negative.   Gastrointestinal: Negative.   Musculoskeletal: Negative.   Skin:       See HPI  Neurological: Negative.     All other systems negative unless noted above in HPI  Past medical history: Past Medical History:  Diagnosis Date  . Arthritis   . Glaucoma of both eyes   . Hyperlipidemia   . Hypertension   . Pelvic prolapse    anterior floor  . Urgency of urination   . Urinary hesitancy   . Urticaria     Past surgical history: Past Surgical History:  Procedure Laterality Date  . PUBOVAGINAL SLING N/A 08/31/2012   Procedure: BOSTON SCIENTIFIC UPHOLD LIGHT SACROSPINUS REPAIR;  Surgeon: Kathi Ludwig, MD;  Location: [facility];  Service: Urology;  Laterality: N/A;  . RETINAL DETACHMENT SURGERY Right     Family history:  Family History  Problem Relation Age of Onset  . Hypertension Mother   . Thyroid disease Neg Hx   . Diabetes Neg Hx     Social history: She lives in a home without carpeting with electric and gas heating and central  cooling.  No pets in the home.  There is a visiting cat that is sometimes outside the home.  There is no concern for water damage, mildew in the home but there is concern for roaches in the home.  Denies a smoking history.  Medication List: Current Outpatient Medications  Medication Sig Dispense Refill  . alendronate (FOSAMAX) 70 MG tablet Take 1 tablet by mouth once a week.    Marland Kitchen aspirin 81 MG tablet Take 81 mg by mouth daily.    . Calcium Carb-Cholecalciferol (CALCIUM 500 +D PO) Take 1 tablet by mouth daily.    . cetirizine (ZYRTEC) 10 MG tablet Take 1 tablet (10 mg total) by mouth 2 (two) times daily. 60 tablet 3  . Cholecalciferol (VITAMIN D3) 1000 UNITS CAPS Take 1 capsule by mouth daily.    .  diphenhydrAMINE (BENADRYL) 25 MG tablet Take 25 mg by mouth every 6 (six) hours as needed.    Marland Kitchen EPINEPHrine (EPIPEN) 0.3 mg/0.3 mL SOAJ injection Inject 0.3 mLs (0.3 mg total) into the muscle once. 1 Device 6  . famotidine (PEPCID) 20 MG tablet Take 1 tablet (20 mg total) by mouth 2 (two) times daily. 60 tablet 3  . fish oil-omega-3 fatty acids 1000 MG capsule Take 1 g by mouth daily.    Marland Kitchen latanoprost (XALATAN) 0.005 % ophthalmic solution     . metoprolol tartrate (LOPRESSOR) 50 MG tablet Take by mouth.    . montelukast (SINGULAIR) 10 MG tablet Take 1 tablet (10 mg total) by mouth daily. 30 tablet 3  . Multiple Vitamin (MULTIVITAMIN) tablet Take 1 tablet by mouth daily.    . Probiotic Product (PROBIOTIC DAILY PO) Take 1 capsule by mouth daily.    . timolol (TIMOPTIC) 0.5 % ophthalmic solution 1 drop 2 (two) times daily.    . vitamin C (ASCORBIC ACID) 500 MG tablet Take 500 mg by mouth daily.    Marland Kitchen acetaminophen (TYLENOL) 500 MG tablet Take 2 tablets (1,000 mg total) by mouth every 6 (six) hours as needed. 30 tablet 0  . atenolol (TENORMIN) 50 MG tablet     . glucosamine-chondroitin 500-400 MG tablet Take 1 tablet by mouth daily. (Patient not taking: Reported on 10/28/2019)    . predniSONE (DELTASONE) 20 MG tablet 2 tabs po daily x 4 days 8 tablet 0  . traMADol (ULTRAM) 50 MG tablet 1-2 tablets every 6 hours as needed for pain. May take with acetaminophen. (Patient not taking: Reported on 10/28/2019) 20 tablet 0   No current facility-administered medications for this visit.    Known medication allergies: Allergies  Allergen Reactions  . Cozaar [Losartan]     Tongue swelling  . Azopt [Brinzolamide]     Rash on eyelid  . Erythromycin Itching     Physical examination: Blood pressure (!) 150/80, pulse 79, temperature 98.6 F (37 C), temperature source Temporal, resp. rate 18, height 5\' 2"  (1.575 m), weight 123 lb 6.4 oz (56 kg), SpO2 95 %.  General: Alert, interactive, in no acute  distress. HEENT: PERRLA, TMs pearly gray, turbinates non-edematous without discharge, post-pharynx non erythematous. Neck: Supple without lymphadenopathy. Lungs: Clear to auscultation without wheezing, rhonchi or rales. {no increased work of breathing. CV: Normal S1, S2 without murmurs. Abdomen: Nondistended, nontender. Skin: Warm and dry, without lesions or rashes. Extremities:  No clubbing, cyanosis or edema. Neuro:   Grossly intact.  Diagnositics/Labs: None today  Assessment and plan: Angioedema and urticaria  Insect bite  - at this time etiology of hives  and swelling is unknown.  Hives and swelling can be caused by a variety of different triggers including illness/infection, foods, medications, stings, exercise, pressure, vibrations, extremes of temperature to name a few however majority of the time there is no identifiable trigger.  Your symptoms have been ongoing for >6 weeks making this chronic and is now recurrent thus will obtain labwork to evaluate: CBC w diff, CMP, tryptase, hive panel, environmental panel, alpha-gal panel, nut panel and fire ant IgE  - for management of hives and swelling recommend the following regimen: Zyrtec 10mg  1 tab twice a day, Pepcid 20mg  1 tab twice a day and Singulair 10mg  1 tab once a day  - reserve benadryl for breakthrough symptoms  - if symptoms continue would recommend trial of Xolair monthly injections for better control of hives and swelling if high-dose antihistamine regimen is not effective enough  - you have access to self-injectable epinephrine Epipen 0.3mg  at all times. Follow emergency action plan in case of allergic reaction.   - recommend use of non-scented and fragrance free body products like Dove white bar soap  - complete prednisone course as directed by your PCP  Follow-up in 2-3 months or sooner if needed   I appreciate the opportunity to take part in Larken's care. Please do not hesitate to contact me with  questions.  Sincerely,   Margo Aye, MD Allergy/Immunology [facility]

## 2019-11-02 ENCOUNTER — Telehealth: Payer: Self-pay

## 2019-11-02 NOTE — Telephone Encounter (Signed)
Patient called to check on her lab results as they have released into her mychart.   Please Advise

## 2019-11-02 NOTE — Telephone Encounter (Signed)
Informed pt that we have the results and that we will call soon as the dr looks at them

## 2019-11-03 DIAGNOSIS — H401122 Primary open-angle glaucoma, left eye, moderate stage: Secondary | ICD-10-CM | POA: Diagnosis not present

## 2019-11-03 DIAGNOSIS — H401113 Primary open-angle glaucoma, right eye, severe stage: Secondary | ICD-10-CM | POA: Diagnosis not present

## 2019-11-05 LAB — ALLERGENS W/TOTAL IGE AREA 2

## 2019-11-05 LAB — ALPHA-GAL PANEL
Alpha Gal IgE*: <0.1 kU/L (ref <0.10)
Beef (Bos spp) IgE: <0.1 kU/L (ref <0.35)
Class Interpretation: 0
Class Interpretation: 0
Class Interpretation: 0
Lamb/Mutton (Ovis spp) IgE: <0.1 kU/L (ref <0.35)
Pork (Sus spp) IgE: <0.1 kU/L (ref <0.35)

## 2019-11-05 LAB — CBC WITH DIFFERENTIAL
Basophils Absolute: 0.1 10*3/uL (ref 0.0–0.2)
Basos: 0 %
EOS (ABSOLUTE): 0.2 10*3/uL (ref 0.0–0.4)
Eos: 1 %
Hematocrit: 41.4 % (ref 34.0–46.6)
Hemoglobin: 13.8 g/dL (ref 11.1–15.9)
Immature Grans (Abs): 0 10*3/uL (ref 0.0–0.1)
Immature Granulocytes: 0 %
Lymphocytes Absolute: 2.4 10*3/uL (ref 0.7–3.1)
Lymphs: 19 %
MCH: 30.7 pg (ref 26.6–33.0)
MCHC: 33.3 g/dL (ref 31.5–35.7)
MCV: 92 fL (ref 79–97)
Monocytes Absolute: 0.8 10*3/uL (ref 0.1–0.9)
Monocytes: 7 %
Neutrophils Absolute: 9 10*3/uL — ABNORMAL HIGH (ref 1.4–7.0)
Neutrophils: 73 %
RBC: 4.5 x10E6/uL (ref 3.77–5.28)
RDW: 13.3 % (ref 11.7–15.4)
WBC: 12.5 10*3/uL — ABNORMAL HIGH (ref 3.4–10.8)

## 2019-11-05 LAB — COMPREHENSIVE METABOLIC PANEL
ALT: 17 IU/L (ref 0–32)
AST: 25 IU/L (ref 0–40)
Albumin/Globulin Ratio: 2 (ref 1.2–2.2)
Albumin: 4.6 g/dL (ref 3.6–4.6)
Alkaline Phosphatase: 85 IU/L (ref 44–121)
BUN/Creatinine Ratio: 26 (ref 12–28)
BUN: 19 mg/dL (ref 8–27)
Bilirubin Total: 0.4 mg/dL (ref 0.0–1.2)
CO2: 24 mmol/L (ref 20–29)
Calcium: 10 mg/dL (ref 8.7–10.3)
Chloride: 93 mmol/L — ABNORMAL LOW (ref 96–106)
Creatinine, Ser: 0.74 mg/dL (ref 0.57–1.00)
GFR calc Af Amer: 84 mL/min/{1.73_m2} (ref >59)
GFR calc non Af Amer: 73 mL/min/{1.73_m2} (ref >59)
Globulin, Total: 2.3 g/dL (ref 1.5–4.5)
Glucose: 99 mg/dL (ref 65–99)
Potassium: 4.7 mmol/L (ref 3.5–5.2)
Sodium: 132 mmol/L — ABNORMAL LOW (ref 134–144)
Total Protein: 6.9 g/dL (ref 6.0–8.5)

## 2019-11-05 LAB — ALLERGENS(7)
Brazil Nut IgE: <0.1 kU/L
F020-IgE Almond: <0.1 kU/L
F202-IgE Cashew Nut: <0.1 kU/L
Hazelnut (Filbert) IgE: <0.1 kU/L
Peanut IgE: <0.1 kU/L
Pecan Nut IgE: <0.1 kU/L
Walnut IgE: <0.1 kU/L

## 2019-11-05 LAB — CHRONIC URTICARIA: cu index: 3 (ref <10)

## 2019-11-05 LAB — TRYPTASE: Tryptase: 5.7 ug/L (ref 2.2–13.2)

## 2019-11-05 LAB — SEDIMENTATION RATE: Sed Rate: 22 mm/hr (ref 0–40)

## 2019-11-05 LAB — ALLERGEN FIRE ANT: I070-IgE Fire Ant (Invicta): <0.1 kU/L

## 2019-11-05 LAB — THYROID ANTIBODIES
Thyroglobulin Antibody: 1.9 IU/mL — ABNORMAL HIGH (ref 0.0–0.9)
Thyroperoxidase Ab SerPl-aCnc: 219 IU/mL — ABNORMAL HIGH (ref 0–34)

## 2019-11-09 ENCOUNTER — Telehealth: Payer: Self-pay

## 2019-11-09 NOTE — Telephone Encounter (Signed)
I don't believe her swelling episodes are being triggered by foods.  Based on her labs she has more of the spontaeous form or a small component of autoimmune form with making self-antibody.   Is she taking the following regimen: zyrtec bid, pepcid bid and singulair daily? If she is then next step if swelling/hives persist is to trial her on xolair.

## 2019-11-09 NOTE — Telephone Encounter (Signed)
Called and spoke to patient and she states she is taking the regimen. I informed her if she was still having swelling to give [place] a callback and we would have to consider doing Xolair. Patient expressed understanding.

## 2019-11-09 NOTE — Telephone Encounter (Signed)
Called and talked to patient discussing her labs. She acknowledged that she understood and mentioned that she printed off her results from her mychart and we discussed them together. She did mention that last night she made chicken noodle soup and added sage seasoning and shortly after dinner she noticed that her bottom lip began to swell. She wants to know what to do. She mentioned that she ate a lot for lunch and wasn't sure if it was due to the foods the consumed for lunch.

## 2019-11-27 ENCOUNTER — Ambulatory Visit: Payer: Medicare PPO | Attending: Internal Medicine

## 2019-11-27 DIAGNOSIS — Z23 Encounter for immunization: Secondary | ICD-10-CM

## 2019-11-27 NOTE — Progress Notes (Signed)
   Covid-19 Vaccination Clinic  Name:  Emily Robertson    MRN: [id number] DOB: [birth year]  11/27/2019  [name] was observed post Covid-19 immunization for 15 minutes without incident. She was provided with Vaccine Information Sheet and instruction to access the V-Safe system.   [name] was instructed to call 911 with any severe reactions post vaccine: Marland Kitchen Difficulty breathing  . Swelling of face and throat  . A fast heartbeat  . A bad rash all over body  . Dizziness and weakness

## 2019-12-06 ENCOUNTER — Other Ambulatory Visit: Payer: Self-pay | Admitting: Allergy

## 2019-12-06 MED ORDER — MONTELUKAST SODIUM 10 MG PO TABS: 10.0000 mg | ORAL_TABLET | Freq: Every day | ORAL | 1 refills | Status: DC

## 2019-12-06 NOTE — Telephone Encounter (Signed)
90 day supply has been sent in and patient made aware.

## 2019-12-06 NOTE — Telephone Encounter (Signed)
Patient states that she just refilled her Singulair, but would like to know if a 90 day supply could be called in instead of just 30 days. Still sending to [facility] on [address]  Please advise.

## 2019-12-20 DIAGNOSIS — E559 Vitamin D deficiency, unspecified: Secondary | ICD-10-CM | POA: Diagnosis not present

## 2019-12-20 DIAGNOSIS — R7303 Prediabetes: Secondary | ICD-10-CM | POA: Diagnosis not present

## 2019-12-20 DIAGNOSIS — Z8639 Personal history of other endocrine, nutritional and metabolic disease: Secondary | ICD-10-CM | POA: Diagnosis not present

## 2019-12-20 DIAGNOSIS — I10 Essential (primary) hypertension: Secondary | ICD-10-CM | POA: Diagnosis not present

## 2019-12-20 DIAGNOSIS — M810 Age-related osteoporosis without current pathological fracture: Secondary | ICD-10-CM | POA: Diagnosis not present

## 2019-12-20 DIAGNOSIS — Z87892 Personal history of anaphylaxis: Secondary | ICD-10-CM | POA: Diagnosis not present

## 2019-12-20 DIAGNOSIS — E7800 Pure hypercholesterolemia, unspecified: Secondary | ICD-10-CM | POA: Diagnosis not present

## 2019-12-20 DIAGNOSIS — Z0000 Encounter for general adult medical examination without abnormal findings: Secondary | ICD-10-CM | POA: Diagnosis not present

## 2020-04-05 DIAGNOSIS — H401113 Primary open-angle glaucoma, right eye, severe stage: Secondary | ICD-10-CM | POA: Diagnosis not present

## 2020-04-05 DIAGNOSIS — H401122 Primary open-angle glaucoma, left eye, moderate stage: Secondary | ICD-10-CM | POA: Diagnosis not present

## 2020-05-14 DIAGNOSIS — J069 Acute upper respiratory infection, unspecified: Secondary | ICD-10-CM | POA: Diagnosis not present

## 2020-05-14 DIAGNOSIS — J40 Bronchitis, not specified as acute or chronic: Secondary | ICD-10-CM | POA: Diagnosis not present

## 2020-06-06 DIAGNOSIS — R52 Pain, unspecified: Secondary | ICD-10-CM | POA: Diagnosis not present

## 2020-06-06 DIAGNOSIS — R6883 Chills (without fever): Secondary | ICD-10-CM | POA: Diagnosis not present

## 2020-06-06 DIAGNOSIS — R5383 Other fatigue: Secondary | ICD-10-CM | POA: Diagnosis not present

## 2020-07-03 DIAGNOSIS — M7989 Other specified soft tissue disorders: Secondary | ICD-10-CM | POA: Diagnosis not present

## 2020-07-03 DIAGNOSIS — M2550 Pain in unspecified joint: Secondary | ICD-10-CM | POA: Diagnosis not present

## 2020-08-02 DIAGNOSIS — M79641 Pain in right hand: Secondary | ICD-10-CM | POA: Diagnosis not present

## 2020-08-02 DIAGNOSIS — M79642 Pain in left hand: Secondary | ICD-10-CM | POA: Diagnosis not present

## 2020-08-02 DIAGNOSIS — Z681 Body mass index (BMI) 19 or less, adult: Secondary | ICD-10-CM | POA: Diagnosis not present

## 2020-08-02 DIAGNOSIS — R5382 Chronic fatigue, unspecified: Secondary | ICD-10-CM | POA: Diagnosis not present

## 2020-08-03 DIAGNOSIS — H401113 Primary open-angle glaucoma, right eye, severe stage: Secondary | ICD-10-CM | POA: Diagnosis not present

## 2020-08-03 DIAGNOSIS — H401122 Primary open-angle glaucoma, left eye, moderate stage: Secondary | ICD-10-CM | POA: Diagnosis not present

## 2020-08-31 DIAGNOSIS — M79642 Pain in left hand: Secondary | ICD-10-CM | POA: Diagnosis not present

## 2020-08-31 DIAGNOSIS — M79641 Pain in right hand: Secondary | ICD-10-CM | POA: Diagnosis not present

## 2020-08-31 DIAGNOSIS — Z681 Body mass index (BMI) 19 or less, adult: Secondary | ICD-10-CM | POA: Diagnosis not present

## 2020-08-31 DIAGNOSIS — M1990 Unspecified osteoarthritis, unspecified site: Secondary | ICD-10-CM | POA: Diagnosis not present

## 2020-09-27 DIAGNOSIS — Z681 Body mass index (BMI) 19 or less, adult: Secondary | ICD-10-CM | POA: Diagnosis not present

## 2020-09-27 DIAGNOSIS — M79641 Pain in right hand: Secondary | ICD-10-CM | POA: Diagnosis not present

## 2020-09-27 DIAGNOSIS — M79642 Pain in left hand: Secondary | ICD-10-CM | POA: Diagnosis not present

## 2020-09-27 DIAGNOSIS — M0609 Rheumatoid arthritis without rheumatoid factor, multiple sites: Secondary | ICD-10-CM | POA: Diagnosis not present

## 2020-09-27 DIAGNOSIS — M1990 Unspecified osteoarthritis, unspecified site: Secondary | ICD-10-CM | POA: Diagnosis not present

## 2020-10-25 DIAGNOSIS — H409 Unspecified glaucoma: Secondary | ICD-10-CM | POA: Diagnosis not present

## 2020-10-25 DIAGNOSIS — I739 Peripheral vascular disease, unspecified: Secondary | ICD-10-CM | POA: Diagnosis not present

## 2020-10-25 DIAGNOSIS — Z7983 Long term (current) use of bisphosphonates: Secondary | ICD-10-CM | POA: Diagnosis not present

## 2020-10-25 DIAGNOSIS — R32 Unspecified urinary incontinence: Secondary | ICD-10-CM | POA: Diagnosis not present

## 2020-10-25 DIAGNOSIS — M069 Rheumatoid arthritis, unspecified: Secondary | ICD-10-CM | POA: Diagnosis not present

## 2020-10-25 DIAGNOSIS — J302 Other seasonal allergic rhinitis: Secondary | ICD-10-CM | POA: Diagnosis not present

## 2020-10-25 DIAGNOSIS — M810 Age-related osteoporosis without current pathological fracture: Secondary | ICD-10-CM | POA: Diagnosis not present

## 2020-10-25 DIAGNOSIS — Z7952 Long term (current) use of systemic steroids: Secondary | ICD-10-CM | POA: Diagnosis not present

## 2020-10-25 DIAGNOSIS — I10 Essential (primary) hypertension: Secondary | ICD-10-CM | POA: Diagnosis not present

## 2020-11-30 DIAGNOSIS — M79641 Pain in right hand: Secondary | ICD-10-CM | POA: Diagnosis not present

## 2020-11-30 DIAGNOSIS — M79642 Pain in left hand: Secondary | ICD-10-CM | POA: Diagnosis not present

## 2020-11-30 DIAGNOSIS — M1990 Unspecified osteoarthritis, unspecified site: Secondary | ICD-10-CM | POA: Diagnosis not present

## 2020-11-30 DIAGNOSIS — M0609 Rheumatoid arthritis without rheumatoid factor, multiple sites: Secondary | ICD-10-CM | POA: Diagnosis not present

## 2020-11-30 DIAGNOSIS — Z6820 Body mass index (BMI) 20.0-20.9, adult: Secondary | ICD-10-CM | POA: Diagnosis not present

## 2020-12-19 ENCOUNTER — Other Ambulatory Visit: Payer: Self-pay | Admitting: Family Medicine

## 2020-12-19 ENCOUNTER — Ambulatory Visit
Admission: RE | Admit: 2020-12-19 | Discharge: 2020-12-19 | Disposition: A | Discharge status: Home / Self Care | Payer: Medicare PPO | Source: Ambulatory Visit | Attending: Family Medicine | Admitting: Family Medicine

## 2020-12-19 DIAGNOSIS — M545 Low back pain, unspecified: Secondary | ICD-10-CM

## 2020-12-19 DIAGNOSIS — M810 Age-related osteoporosis without current pathological fracture: Secondary | ICD-10-CM | POA: Diagnosis not present

## 2020-12-19 DIAGNOSIS — M546 Pain in thoracic spine: Secondary | ICD-10-CM | POA: Diagnosis not present

## 2020-12-21 ENCOUNTER — Other Ambulatory Visit: Payer: Self-pay | Admitting: Family Medicine

## 2020-12-21 DIAGNOSIS — R9389 Abnormal findings on diagnostic imaging of other specified body structures: Secondary | ICD-10-CM

## 2020-12-27 DIAGNOSIS — Z6820 Body mass index (BMI) 20.0-20.9, adult: Secondary | ICD-10-CM | POA: Diagnosis not present

## 2020-12-27 DIAGNOSIS — M79641 Pain in right hand: Secondary | ICD-10-CM | POA: Diagnosis not present

## 2020-12-27 DIAGNOSIS — M150 Primary generalized (osteo)arthritis: Secondary | ICD-10-CM | POA: Diagnosis not present

## 2020-12-27 DIAGNOSIS — M79642 Pain in left hand: Secondary | ICD-10-CM | POA: Diagnosis not present

## 2020-12-27 DIAGNOSIS — M0609 Rheumatoid arthritis without rheumatoid factor, multiple sites: Secondary | ICD-10-CM | POA: Diagnosis not present

## 2021-01-09 DIAGNOSIS — M0609 Rheumatoid arthritis without rheumatoid factor, multiple sites: Secondary | ICD-10-CM | POA: Diagnosis not present

## 2021-01-09 DIAGNOSIS — R9389 Abnormal findings on diagnostic imaging of other specified body structures: Secondary | ICD-10-CM | POA: Diagnosis not present

## 2021-01-09 DIAGNOSIS — L600 Ingrowing nail: Secondary | ICD-10-CM | POA: Diagnosis not present

## 2021-01-09 DIAGNOSIS — M8580 Other specified disorders of bone density and structure, unspecified site: Secondary | ICD-10-CM | POA: Diagnosis not present

## 2021-01-09 DIAGNOSIS — I10 Essential (primary) hypertension: Secondary | ICD-10-CM | POA: Diagnosis not present

## 2021-01-09 DIAGNOSIS — Z0000 Encounter for general adult medical examination without abnormal findings: Secondary | ICD-10-CM | POA: Diagnosis not present

## 2021-01-09 DIAGNOSIS — I700 Atherosclerosis of aorta: Secondary | ICD-10-CM | POA: Diagnosis not present

## 2021-01-09 DIAGNOSIS — R7303 Prediabetes: Secondary | ICD-10-CM | POA: Diagnosis not present

## 2021-01-09 DIAGNOSIS — M859 Disorder of bone density and structure, unspecified: Secondary | ICD-10-CM | POA: Diagnosis not present

## 2021-01-12 ENCOUNTER — Ambulatory Visit
Admission: RE | Admit: 2021-01-12 | Discharge: 2021-01-12 | Disposition: A | Discharge status: Home / Self Care | Payer: Medicare PPO | Source: Ambulatory Visit | Attending: Family Medicine | Admitting: Family Medicine

## 2021-01-12 DIAGNOSIS — R918 Other nonspecific abnormal finding of lung field: Secondary | ICD-10-CM | POA: Diagnosis not present

## 2021-01-12 DIAGNOSIS — R9389 Abnormal findings on diagnostic imaging of other specified body structures: Secondary | ICD-10-CM

## 2021-01-12 DIAGNOSIS — J9811 Atelectasis: Secondary | ICD-10-CM | POA: Diagnosis not present

## 2021-01-12 DIAGNOSIS — I700 Atherosclerosis of aorta: Secondary | ICD-10-CM | POA: Diagnosis not present

## 2021-01-12 DIAGNOSIS — R911 Solitary pulmonary nodule: Secondary | ICD-10-CM | POA: Diagnosis not present

## 2021-01-15 ENCOUNTER — Other Ambulatory Visit: Payer: Self-pay

## 2021-01-15 ENCOUNTER — Ambulatory Visit: Payer: Medicare PPO | Admitting: Podiatry

## 2021-01-15 DIAGNOSIS — L02612 Cutaneous abscess of left foot: Secondary | ICD-10-CM

## 2021-01-15 DIAGNOSIS — B351 Tinea unguium: Secondary | ICD-10-CM

## 2021-01-15 MED ORDER — CEPHALEXIN 500 MG PO CAPS: 500.0000 mg | ORAL_CAPSULE | Freq: Three times a day (TID) | ORAL | 0 refills | Status: AC

## 2021-01-15 NOTE — Progress Notes (Signed)
  Subjective:  Patient ID: Emily Robertson, female    DOB: [birth year],  MRN: [id number]  Chief Complaint  Patient presents with   Nail Problem    (np) 1st toenail ingrown, onychomycosis nails too     87 y.o. female presents with the above complaint. History confirmed with patient.  Presents today with quite a bit of pain in the first great toenail which is yellowed and discolored and has pain from pressure on the toe next to it  Objective:  Physical Exam: warm, good capillary refill, no trophic changes or ulcerative lesions, normal DP and PT pulses, normal sensory exam, and onychomycosis.  The left hallux nail is painful to touch and upon debridement has a small amount of purulent drainage underneath the nail plate, no ulceration exposed bone cellulitis or malodor Assessment:   1. Abscess of great toe, left   2. Onychomycosis      Plan:  Patient was evaluated and treated and all questions answered.  Debrided the left hallux nail and discussed with her that she has nail fungus which is quite thickened likely has developed a small abscess causing her pain under the nail plate.  There is no deep ulceration or compromise of the deeper tissues.  I recommended Epsom salt soak for 2 weeks and I placed her on 1 week of Keflex to alleviate any residual infection.  Hopefully this will resolve it.  She may require intermittent debridement of the nail plate to remove pressure as it grows back and becomes thick.  She will return to see me as needed  Return if symptoms worsen or fail to improve.

## 2021-01-15 NOTE — Patient Instructions (Signed)
Soak Instructions    THE DAY AFTER THE PROCEDURE  Place 1/4 cup of epsom salts (or betadine, or white vinegar) in a quart of warm tap water.  Soak in the solution for 20 minutes.  This soak should be done twice a day.  Next, remove your foot or feet from solution, blot dry the affected area and cover.  You can leave the toe open to air. May wear the silicone pad to cushion it   IF YOUR SKIN BECOMES IRRITATED WHILE USING THESE INSTRUCTIONS, IT IS OKAY TO SWITCH TO  WHITE VINEGAR AND WATER. Or you may use antibacterial soap and water to keep the toe clean  Monitor for any signs/symptoms of infection. Call the office immediately if any occur or go directly to the emergency room. Call with any questions/concerns.

## 2021-02-01 DIAGNOSIS — H401122 Primary open-angle glaucoma, left eye, moderate stage: Secondary | ICD-10-CM | POA: Diagnosis not present

## 2021-02-01 DIAGNOSIS — H401113 Primary open-angle glaucoma, right eye, severe stage: Secondary | ICD-10-CM | POA: Diagnosis not present

## 2021-03-06 DIAGNOSIS — M79642 Pain in left hand: Secondary | ICD-10-CM | POA: Diagnosis not present

## 2021-03-06 DIAGNOSIS — M79641 Pain in right hand: Secondary | ICD-10-CM | POA: Diagnosis not present

## 2021-03-06 DIAGNOSIS — M150 Primary generalized (osteo)arthritis: Secondary | ICD-10-CM | POA: Diagnosis not present

## 2021-03-06 DIAGNOSIS — M0609 Rheumatoid arthritis without rheumatoid factor, multiple sites: Secondary | ICD-10-CM | POA: Diagnosis not present

## 2021-03-06 DIAGNOSIS — Z681 Body mass index (BMI) 19 or less, adult: Secondary | ICD-10-CM | POA: Diagnosis not present

## 2021-04-16 DIAGNOSIS — Z681 Body mass index (BMI) 19 or less, adult: Secondary | ICD-10-CM | POA: Diagnosis not present

## 2021-04-16 DIAGNOSIS — M79641 Pain in right hand: Secondary | ICD-10-CM | POA: Diagnosis not present

## 2021-04-16 DIAGNOSIS — M0609 Rheumatoid arthritis without rheumatoid factor, multiple sites: Secondary | ICD-10-CM | POA: Diagnosis not present

## 2021-04-16 DIAGNOSIS — M79642 Pain in left hand: Secondary | ICD-10-CM | POA: Diagnosis not present

## 2021-04-16 DIAGNOSIS — M1991 Primary osteoarthritis, unspecified site: Secondary | ICD-10-CM | POA: Diagnosis not present

## 2021-05-29 DIAGNOSIS — Z681 Body mass index (BMI) 19 or less, adult: Secondary | ICD-10-CM | POA: Diagnosis not present

## 2021-05-29 DIAGNOSIS — M0609 Rheumatoid arthritis without rheumatoid factor, multiple sites: Secondary | ICD-10-CM | POA: Diagnosis not present

## 2021-05-29 DIAGNOSIS — M79642 Pain in left hand: Secondary | ICD-10-CM | POA: Diagnosis not present

## 2021-05-29 DIAGNOSIS — M1991 Primary osteoarthritis, unspecified site: Secondary | ICD-10-CM | POA: Diagnosis not present

## 2021-05-29 DIAGNOSIS — M79641 Pain in right hand: Secondary | ICD-10-CM | POA: Diagnosis not present

## 2021-06-06 DIAGNOSIS — H401113 Primary open-angle glaucoma, right eye, severe stage: Secondary | ICD-10-CM | POA: Diagnosis not present

## 2021-06-06 DIAGNOSIS — H401122 Primary open-angle glaucoma, left eye, moderate stage: Secondary | ICD-10-CM | POA: Diagnosis not present

## 2021-07-10 DIAGNOSIS — J302 Other seasonal allergic rhinitis: Secondary | ICD-10-CM | POA: Diagnosis not present

## 2021-07-10 DIAGNOSIS — I10 Essential (primary) hypertension: Secondary | ICD-10-CM | POA: Diagnosis not present

## 2021-07-10 DIAGNOSIS — R5383 Other fatigue: Secondary | ICD-10-CM | POA: Diagnosis not present

## 2021-07-10 DIAGNOSIS — I700 Atherosclerosis of aorta: Secondary | ICD-10-CM | POA: Diagnosis not present

## 2021-07-10 DIAGNOSIS — Z6820 Body mass index (BMI) 20.0-20.9, adult: Secondary | ICD-10-CM | POA: Diagnosis not present

## 2021-07-10 DIAGNOSIS — R946 Abnormal results of thyroid function studies: Secondary | ICD-10-CM | POA: Diagnosis not present

## 2021-07-10 DIAGNOSIS — M810 Age-related osteoporosis without current pathological fracture: Secondary | ICD-10-CM | POA: Diagnosis not present

## 2021-07-17 DIAGNOSIS — M79641 Pain in right hand: Secondary | ICD-10-CM | POA: Diagnosis not present

## 2021-07-17 DIAGNOSIS — M1991 Primary osteoarthritis, unspecified site: Secondary | ICD-10-CM | POA: Diagnosis not present

## 2021-07-17 DIAGNOSIS — Z681 Body mass index (BMI) 19 or less, adult: Secondary | ICD-10-CM | POA: Diagnosis not present

## 2021-07-17 DIAGNOSIS — M0609 Rheumatoid arthritis without rheumatoid factor, multiple sites: Secondary | ICD-10-CM | POA: Diagnosis not present

## 2021-07-17 DIAGNOSIS — M79642 Pain in left hand: Secondary | ICD-10-CM | POA: Diagnosis not present

## 2021-07-25 ENCOUNTER — Other Ambulatory Visit: Payer: Self-pay

## 2021-07-25 ENCOUNTER — Encounter (HOSPITAL_COMMUNITY): Payer: Self-pay

## 2021-07-25 ENCOUNTER — Inpatient Hospital Stay (HOSPITAL_COMMUNITY)
Admission: EM | Admit: 2021-07-25 | Discharge: 2021-07-31 | DRG: 543 | Disposition: A | Discharge status: Skilled Nursing Facility | Payer: Medicare PPO | Attending: Internal Medicine | Admitting: Internal Medicine

## 2021-07-25 ENCOUNTER — Emergency Department (HOSPITAL_COMMUNITY): Payer: Medicare PPO

## 2021-07-25 DIAGNOSIS — H409 Unspecified glaucoma: Secondary | ICD-10-CM | POA: Diagnosis present

## 2021-07-25 DIAGNOSIS — M80052A Age-related osteoporosis with current pathological fracture, left femur, initial encounter for fracture: Secondary | ICD-10-CM | POA: Diagnosis not present

## 2021-07-25 DIAGNOSIS — S79912A Unspecified injury of left hip, initial encounter: Secondary | ICD-10-CM | POA: Diagnosis not present

## 2021-07-25 DIAGNOSIS — M8088XA Other osteoporosis with current pathological fracture, vertebra(e), initial encounter for fracture: Secondary | ICD-10-CM | POA: Diagnosis not present

## 2021-07-25 DIAGNOSIS — R296 Repeated falls: Secondary | ICD-10-CM | POA: Diagnosis present

## 2021-07-25 DIAGNOSIS — E7800 Pure hypercholesterolemia, unspecified: Secondary | ICD-10-CM | POA: Diagnosis present

## 2021-07-25 DIAGNOSIS — S0181XA Laceration without foreign body of other part of head, initial encounter: Secondary | ICD-10-CM

## 2021-07-25 DIAGNOSIS — I10 Essential (primary) hypertension: Secondary | ICD-10-CM | POA: Diagnosis not present

## 2021-07-25 DIAGNOSIS — M8448XA Pathological fracture, other site, initial encounter for fracture: Secondary | ICD-10-CM | POA: Diagnosis present

## 2021-07-25 DIAGNOSIS — I6381 Other cerebral infarction due to occlusion or stenosis of small artery: Secondary | ICD-10-CM | POA: Diagnosis not present

## 2021-07-25 DIAGNOSIS — M069 Rheumatoid arthritis, unspecified: Secondary | ICD-10-CM | POA: Diagnosis not present

## 2021-07-25 DIAGNOSIS — M810 Age-related osteoporosis without current pathological fracture: Secondary | ICD-10-CM | POA: Diagnosis present

## 2021-07-25 DIAGNOSIS — S199XXA Unspecified injury of neck, initial encounter: Secondary | ICD-10-CM | POA: Diagnosis not present

## 2021-07-25 DIAGNOSIS — W19XXXA Unspecified fall, initial encounter: Secondary | ICD-10-CM

## 2021-07-25 DIAGNOSIS — E876 Hypokalemia: Secondary | ICD-10-CM | POA: Diagnosis present

## 2021-07-25 DIAGNOSIS — I340 Nonrheumatic mitral (valve) insufficiency: Secondary | ICD-10-CM | POA: Diagnosis present

## 2021-07-25 DIAGNOSIS — M25552 Pain in left hip: Secondary | ICD-10-CM | POA: Diagnosis not present

## 2021-07-25 DIAGNOSIS — S329XXA Fracture of unspecified parts of lumbosacral spine and pelvis, initial encounter for closed fracture: Secondary | ICD-10-CM | POA: Diagnosis not present

## 2021-07-25 DIAGNOSIS — R7989 Other specified abnormal findings of blood chemistry: Secondary | ICD-10-CM | POA: Diagnosis not present

## 2021-07-25 DIAGNOSIS — M5136 Other intervertebral disc degeneration, lumbar region: Secondary | ICD-10-CM | POA: Diagnosis not present

## 2021-07-25 DIAGNOSIS — Z8249 Family history of ischemic heart disease and other diseases of the circulatory system: Secondary | ICD-10-CM | POA: Diagnosis not present

## 2021-07-25 DIAGNOSIS — J302 Other seasonal allergic rhinitis: Secondary | ICD-10-CM | POA: Diagnosis present

## 2021-07-25 DIAGNOSIS — Z79899 Other long term (current) drug therapy: Secondary | ICD-10-CM | POA: Diagnosis not present

## 2021-07-25 DIAGNOSIS — S32810D Multiple fractures of pelvis with stable disruption of pelvic ring, subsequent encounter for fracture with routine healing: Secondary | ICD-10-CM | POA: Diagnosis not present

## 2021-07-25 DIAGNOSIS — E039 Hypothyroidism, unspecified: Secondary | ICD-10-CM | POA: Diagnosis not present

## 2021-07-25 DIAGNOSIS — M2550 Pain in unspecified joint: Secondary | ICD-10-CM | POA: Diagnosis not present

## 2021-07-25 DIAGNOSIS — E0590 Thyrotoxicosis, unspecified without thyrotoxic crisis or storm: Secondary | ICD-10-CM | POA: Diagnosis present

## 2021-07-25 DIAGNOSIS — I4891 Unspecified atrial fibrillation: Secondary | ICD-10-CM | POA: Diagnosis not present

## 2021-07-25 DIAGNOSIS — K5730 Diverticulosis of large intestine without perforation or abscess without bleeding: Secondary | ICD-10-CM | POA: Diagnosis not present

## 2021-07-25 DIAGNOSIS — R778 Other specified abnormalities of plasma proteins: Secondary | ICD-10-CM | POA: Diagnosis not present

## 2021-07-25 DIAGNOSIS — S01112A Laceration without foreign body of left eyelid and periocular area, initial encounter: Secondary | ICD-10-CM | POA: Diagnosis present

## 2021-07-25 DIAGNOSIS — Z7983 Long term (current) use of bisphosphonates: Secondary | ICD-10-CM | POA: Diagnosis not present

## 2021-07-25 DIAGNOSIS — S3210XA Unspecified fracture of sacrum, initial encounter for closed fracture: Secondary | ICD-10-CM | POA: Diagnosis not present

## 2021-07-25 DIAGNOSIS — Z7401 Bed confinement status: Secondary | ICD-10-CM | POA: Diagnosis not present

## 2021-07-25 DIAGNOSIS — S32810A Multiple fractures of pelvis with stable disruption of pelvic ring, initial encounter for closed fracture: Secondary | ICD-10-CM | POA: Diagnosis not present

## 2021-07-25 DIAGNOSIS — Y92009 Unspecified place in unspecified non-institutional (private) residence as the place of occurrence of the external cause: Secondary | ICD-10-CM | POA: Diagnosis not present

## 2021-07-25 DIAGNOSIS — W010XXA Fall on same level from slipping, tripping and stumbling without subsequent striking against object, initial encounter: Secondary | ICD-10-CM | POA: Diagnosis present

## 2021-07-25 DIAGNOSIS — S0990XA Unspecified injury of head, initial encounter: Secondary | ICD-10-CM | POA: Diagnosis not present

## 2021-07-25 DIAGNOSIS — I248 Other forms of acute ischemic heart disease: Secondary | ICD-10-CM | POA: Diagnosis present

## 2021-07-25 DIAGNOSIS — I480 Paroxysmal atrial fibrillation: Secondary | ICD-10-CM | POA: Diagnosis present

## 2021-07-25 DIAGNOSIS — E785 Hyperlipidemia, unspecified: Secondary | ICD-10-CM | POA: Diagnosis not present

## 2021-07-25 DIAGNOSIS — S32512A Fracture of superior rim of left pubis, initial encounter for closed fracture: Secondary | ICD-10-CM | POA: Diagnosis not present

## 2021-07-25 LAB — COMPREHENSIVE METABOLIC PANEL
ALT: 23 U/L (ref 0–44)
AST: 33 U/L (ref 15–41)
Albumin: 4 g/dL (ref 3.5–5.0)
Alkaline Phosphatase: 95 U/L (ref 38–126)
Anion gap: 11 (ref 5–15)
BUN: 16 mg/dL (ref 8–23)
CO2: 23 mmol/L (ref 22–32)
Calcium: 9.4 mg/dL (ref 8.9–10.3)
Chloride: 100 mmol/L (ref 98–111)
Creatinine, Ser: 0.65 mg/dL (ref 0.44–1.00)
GFR, Estimated: >60 mL/min (ref >60)
Glucose, Bld: 145 mg/dL — ABNORMAL HIGH (ref 70–99)
Potassium: 3.6 mmol/L (ref 3.5–5.1)
Sodium: 134 mmol/L — ABNORMAL LOW (ref 135–145)
Total Bilirubin: 1 mg/dL (ref 0.3–1.2)
Total Protein: 7.2 g/dL (ref 6.5–8.1)

## 2021-07-25 LAB — CBC WITH DIFFERENTIAL/PLATELET
Abs Immature Granulocytes: 0.02 10*3/uL (ref 0.00–0.07)
Basophils Absolute: 0.1 10*3/uL (ref 0.0–0.1)
Basophils Relative: 0 %
Eosinophils Absolute: 0 10*3/uL (ref 0.0–0.5)
Eosinophils Relative: 0 %
HCT: 42.3 % (ref 36.0–46.0)
Hemoglobin: 14.2 g/dL (ref 12.0–15.0)
Immature Granulocytes: 0 %
Lymphocytes Relative: 9 %
Lymphs Abs: 1.2 10*3/uL (ref 0.7–4.0)
MCH: 29.4 pg (ref 26.0–34.0)
MCHC: 33.6 g/dL (ref 30.0–36.0)
MCV: 87.6 fL (ref 80.0–100.0)
Monocytes Absolute: 0.8 10*3/uL (ref 0.1–1.0)
Monocytes Relative: 7 %
Neutro Abs: 10.4 10*3/uL — ABNORMAL HIGH (ref 1.7–7.7)
Neutrophils Relative %: 84 %
Platelets: 285 10*3/uL (ref 150–400)
RBC: 4.83 MIL/uL (ref 3.87–5.11)
RDW: 13.2 % (ref 11.5–15.5)
WBC: 12.5 10*3/uL — ABNORMAL HIGH (ref 4.0–10.5)
nRBC: 0 % (ref 0.0–0.2)

## 2021-07-25 LAB — URINALYSIS, ROUTINE W REFLEX MICROSCOPIC
Bilirubin Urine: NEGATIVE
Glucose, UA: NEGATIVE mg/dL
Hgb urine dipstick: NEGATIVE
Ketones, ur: 5 mg/dL — AB
Leukocytes,Ua: NEGATIVE
Nitrite: NEGATIVE
Protein, ur: 30 mg/dL — AB
Specific Gravity, Urine: 1.012 (ref 1.005–1.030)
pH: 6 (ref 5.0–8.0)

## 2021-07-25 LAB — TROPONIN I (HIGH SENSITIVITY)
Troponin I (High Sensitivity): 260 ng/L (ref <18)
Troponin I (High Sensitivity): 195 ng/L (ref <18)

## 2021-07-25 MED ORDER — ONDANSETRON HCL 4 MG/2ML IJ SOLN: 4.0000 mg | Freq: Four times a day (QID) | INTRAMUSCULAR | Status: DC | PRN

## 2021-07-25 MED ORDER — DILTIAZEM LOAD VIA INFUSION
10.0000 mg | Freq: Once | INTRAVENOUS | Status: AC
  Administered 2021-07-25: 10 mg via INTRAVENOUS
  Filled 2021-07-25: qty 10

## 2021-07-25 MED ORDER — DILTIAZEM HCL-DEXTROSE 125-5 MG/125ML-% IV SOLN (PREMIX)
5.0000 mg/h | INTRAVENOUS | Status: DC
  Administered 2021-07-25: 5 mg/h via INTRAVENOUS
  Administered 2021-07-26: 12.5 mg/h via INTRAVENOUS
  Administered 2021-07-26: 5 mg/h via INTRAVENOUS
  Filled 2021-07-25 (×4): qty 125

## 2021-07-25 MED ORDER — LIDOCAINE HCL (PF) 1 % IJ SOLN
5.0000 mL | Freq: Once | INTRAMUSCULAR | Status: AC
  Administered 2021-07-25: 5 mL
  Filled 2021-07-25: qty 30

## 2021-07-25 MED ORDER — HYDROCODONE-ACETAMINOPHEN 5-325 MG PO TABS: 1.0000 | ORAL_TABLET | Freq: Four times a day (QID) | ORAL | Status: DC | PRN

## 2021-07-25 NOTE — H&P (Signed)
History and Physical    Patient: Emily RenshawJewel C Constantino ZOX:[id number] DOB: 20-Feb-1930 DOA: 07/25/2021 DOS: the patient was seen and examined on 07/25/2021 PCP: Rogers BlockerMiller, Lisa B, FNP  Patient coming from: Home  Chief Complaint:  Chief Complaint  Patient presents with   Hip Pain   Head Laceration   HPI: Emily RenshawJewel C Pickney is a 88 y.o. female with medical history significant of essential hypertension, hyperlipidemia, urinary urgency, glaucoma, osteoarthritis, rheumatoid arthritis, osteoporosis who sustained a fall at home that was purely mechanical.  Patient landed on her left side.  She sustained laceration on the left forehead above the eyelid.  [facility] complaining of left-sided pain.  She was seen and evaluated and sent to the ER for x-ray and further treatment.  In the ER patient was found to have mainly nondisplaced pelvic fracture.  While being evaluated however she was noted to have A-fib with RVR which is new for the patient.  Patient is therefore being admitted to the hospital for further evaluation and treatment.  She is tachycardic but no chest pain.  Noted to have initial cardiac enzymes elevated probably from A-fib with RVR.  Cardiology was consulted.  Recommended Cardizem drip to start with.  No anticoagulation due to fall and risk of falls.  Review of Systems: As mentioned in the history of present illness. All other systems reviewed and are negative. Past Medical History:  Diagnosis Date   Arthritis    Glaucoma of both eyes    Hyperlipidemia    Hypertension    Pelvic prolapse    anterior floor   Urgency of urination    Urinary hesitancy    Urticaria    Past Surgical History:  Procedure Laterality Date   PUBOVAGINAL SLING N/A 08/31/2012   Procedure: BOSTON SCIENTIFIC UPHOLD LIGHT SACROSPINUS REPAIR;  Surgeon: Kathi LudwigSigmund I Tannenbaum, MD;  [facility];  Service: Urology;  Laterality: N/A;   RETINAL DETACHMENT SURGERY Right    Social  History:  reports that she has never smoked. She has never used smokeless tobacco. She reports current alcohol use. She reports that she does not use drugs.  Allergies  Allergen Reactions   Cozaar [Losartan]     Tongue swelling   Azopt [Brinzolamide]     Rash on eyelid   Erythromycin Itching    Family History  Problem Relation Age of Onset   Hypertension Mother    Thyroid disease Neg Hx    Diabetes Neg Hx     Prior to Admission medications   Medication Sig Start Date End Date Taking? Authorizing Provider  acetaminophen (TYLENOL) 500 MG tablet Take 2 tablets (1,000 mg total) by mouth every 6 (six) hours as needed. 07/24/16   Arby BarrettePfeiffer, Marcy, MD  alendronate (FOSAMAX) 70 MG tablet Take 1 tablet by mouth once a week. 08/10/14   [provider]  aspirin 81 MG tablet Take 81 mg by mouth daily.    [provider]  atenolol (TENORMIN) 50 MG tablet  06/04/13   [provider]  Calcium Carb-Cholecalciferol (CALCIUM 500 +D PO) Take 1 tablet by mouth daily.    [provider]  cetirizine (ZYRTEC) 10 MG tablet Take 1 tablet (10 mg total) by mouth 2 (two) times daily. 10/28/19   Marcelyn BruinsPadgett, Shaylar Patricia, MD  Cholecalciferol (VITAMIN D3) 1000 UNITS CAPS Take 1 capsule by mouth daily.    [provider]  diphenhydrAMINE (BENADRYL) 25 MG tablet Take 25 mg by mouth every 6 (six) hours as  needed.    [provider]  EPINEPHrine (EPIPEN) 0.3 mg/0.3 mL SOAJ injection Inject 0.3 mLs (0.3 mg total) into the muscle once. 12/01/12   Simonne Martinet, NP  famotidine (PEPCID) 20 MG tablet Take 1 tablet (20 mg total) by mouth 2 (two) times daily. 10/28/19   Marcelyn Bruins, MD  fish oil-omega-3 fatty acids 1000 MG capsule Take 1 g by mouth daily.    [provider]  folic acid (FOLVITE) 1 MG tablet Take 1 mg by mouth 2 (two) times daily. 03/27/21   [provider]  glucosamine-chondroitin 500-400 MG tablet Take 1 tablet by mouth  daily. Patient not taking: Reported on 10/28/2019    [provider]  latanoprost (XALATAN) 0.005 % ophthalmic solution  09/14/19   [provider]  leflunomide (ARAVA) 10 MG tablet Take 10 mg by mouth daily. 07/24/21   [provider]  lisinopril (ZESTRIL) 10 MG tablet Take 10 mg by mouth daily. 07/24/21   [provider]  methotrexate (RHEUMATREX) 2.5 MG tablet Take by mouth. 03/12/21   [provider]  metoprolol succinate (TOPROL-XL) 100 MG 24 hr tablet Take 100 mg by mouth daily. 06/18/21   [provider]  metoprolol tartrate (LOPRESSOR) 50 MG tablet Take by mouth.    [provider]  montelukast (SINGULAIR) 10 MG tablet Take 1 tablet (10 mg total) by mouth daily. 12/06/19   Marcelyn Bruins, MD  Multiple Vitamin (MULTIVITAMIN) tablet Take 1 tablet by mouth daily.    [provider]  predniSONE (DELTASONE) 20 MG tablet 2 tabs po daily x 4 days 07/24/16   Arby Barrette, MD  Probiotic Product (PROBIOTIC DAILY PO) Take 1 capsule by mouth daily.    [provider]  timolol (TIMOPTIC) 0.5 % ophthalmic solution 1 drop 2 (two) times daily.    [provider]  traMADol (ULTRAM) 50 MG tablet 1-2 tablets every 6 hours as needed for pain. May take with acetaminophen. Patient not taking: Reported on 10/28/2019 07/24/16   Arby Barrette, MD  vitamin C (ASCORBIC ACID) 500 MG tablet Take 500 mg by mouth daily.    [provider]    Physical Exam: Vitals:   07/25/21 2050 07/25/21 2100 07/25/21 2115 07/25/21 2130  BP:  131/80 128/76 (!) 146/73  Pulse: 86 (!) 131 (!) 134 (!) 122  Resp: 19 20 (!) 23 17  Temp:      TempSrc:      SpO2: 98% 97% 97% 97%   Generally: Awake alert on oriented, in mild distress HEENT: PERRL, EOMI, laceration on the left forehead Neck: Supple, no JVD no lymphadenopathy Respiratory: Good air entry bilaterally no significant wheeze rales or crackles Cardiovascular: Irregularly  irregular with tachycardia, no murmur Abdomen: Soft, nontender with positive bowel sounds Extremities: No edema cyanosis or clubbing Neuro: Nonfocal Skin exam: No rashes or ulcers  Data Reviewed:  Temperature 9078, blood pressure 116/102, pulse 139, respirate 28, white count is 12.5, troponin 260, sodium 134 glucose 145 the rest of the LFTs appear to be within normal.  EKG showed atrial fibrillation with rapid ventricular response.  X-ray of the hip showed suspected left parasymphyseal pubic bone fracture which is minimally displaced CT cervical spine CT chest without contrast and CT head without contrast all showed no acute findings.  CT pelvis without contrast showed acute impacted fracture of the left superior pubic ramus acute plastic fracture of the left inferior pubic ramus bilateral sacral ala insufficiency fractures and diffuse osteopenia.  Assessment and Plan:   #1 new onset A-fib with RVR: Patient will be admitted to progressive floor.  Initiated on IV Cardizem drip.  Cardiology consulted.  Patient will be transition to oral therapy with Cardizem or beta-blocker.  Continue monitoring on telemetry.  #2 fall with pelvic fractures: No anticoagulation.  Pain control, PT and OT consultation.  #3 essential hypertension: Confirm on resume home regimen.  #4 osteoporosis: Patient at risk for fractures.  Home regimen  #5 hyperlipidemia: Continue with statin  #6 history of hypothyroidism: Confirm home regimen.  #7 history of rheumatoid arthritis: Confirm on resume home regimen.     Advance Care Planning:   Code Status: Prior full code  Consults: Cardiology  Family Communication: Son at bedside  Severity of Illness: The appropriate patient status for this patient is INPATIENT. Inpatient status is judged to be reasonable and necessary in order to provide the required intensity of service to ensure the patient's safety. The patient's presenting symptoms, physical exam findings, and  initial radiographic and laboratory data in the context of their chronic comorbidities is felt to place them at high risk for further clinical deterioration. Furthermore, it is not anticipated that the patient will be medically stable for discharge from the hospital within 2 midnights of admission.   * I certify that at the point of admission it is my clinical judgment that the patient will require inpatient hospital care spanning beyond 2 midnights from the point of admission due to high intensity of service, high risk for further deterioration and high frequency of surveillance required.*  AuthorLonia Blood, MD 07/25/2021 9:51 PM  For on call review [email or web address].

## 2021-07-25 NOTE — ED Triage Notes (Signed)
Pt. States she fell around 11:30am, as a result she has a laceration to the head and some right hip pain. Pt. Is not on blood thinners. Pt. Went to [facility] to get treatment and then sent her to the ED for an X-ray.

## 2021-07-25 NOTE — ED Notes (Signed)
Pts heart rate up to 132, monitors showing A Fib. Sherian Maroonooper Robbins, [place] aware.

## 2021-07-25 NOTE — ED Notes (Signed)
Dr. Jonelle Sidle bedside.

## 2021-07-25 NOTE — ED Notes (Signed)
Dr. [name] bedside.

## 2021-07-25 NOTE — ED Provider Notes (Signed)
[facility] DEPT Provider Note   CSN: [id number] Arrival date & time: 07/25/21  1700     History  Chief Complaint  Patient presents with   Hip Pain   Head Laceration    Emily Robertson is a 88 y.o. female.   Hip Pain Pertinent negatives include no chest pain, no abdominal pain and no shortness of breath.  Head Laceration Pertinent negatives include no chest pain, no abdominal pain and no shortness of breath.  88 year old female presents emergency department after a mechanical fall around 3 this afternoon.  Patient states she was on her way into the living room when her floor transition to carpet and she lost her balance and fell on her left hip.  She is not able to get up from the incident.  She tried for about an hour before she was called the neighbors for help.  During the fall, her glasses hit the ground causing a laceration above her left eye.  She denies loss of consciousness, blood thinner use, nausea/vomiting, changes in vision since the incident.  She visited her primary care provider with a advised her to come to the emergency department. Denies fever, chills, night sweats, chest pain, shortness of breath, abdominal pain, N/V/D, urinary/vaginal symptoms, change in bowel habits.  Past Medical History:  Diagnosis Date   Arthritis    Glaucoma of both eyes    Hyperlipidemia    Hypertension    Pelvic prolapse    anterior floor   Urgency of urination    Urinary hesitancy    Urticaria    Past Surgical History:  Procedure Laterality Date   PUBOVAGINAL SLING N/A 08/31/2012   Procedure: [employer] UPHOLD LIGHT [place];  Surgeon: Ailene Rud, MD;  Location: [facility];  Service: Urology;  Laterality: N/A;   RETINAL DETACHMENT SURGERY Right     Home Medications Prior to Admission medications   Medication Sig Start Date End Date Taking? Authorizing Provider  acetaminophen (TYLENOL) 500 MG tablet Take  2 tablets (1,000 mg total) by mouth every 6 (six) hours as needed. Patient taking differently: Take 1,000 mg by mouth every 6 (six) hours as needed for moderate pain. 07/24/16  Yes Charlesetta Shanks, MD  alendronate (FOSAMAX) 70 MG tablet Take 1 tablet by mouth once a week. wednesday 08/10/14  Yes [provider]  Calcium Carb-Cholecalciferol (CALCIUM 500 +D PO) Take 1 tablet by mouth daily.   Yes [provider]  cetirizine (ZYRTEC) 10 MG tablet Take 1 tablet (10 mg total) by mouth 2 (two) times daily. 10/28/19  Yes Padgett, Rae Halsted, MD  Cholecalciferol (VITAMIN D3) 1000 UNITS CAPS Take 1 capsule by mouth daily.   Yes [provider]  diphenhydrAMINE (BENADRYL) 25 MG tablet Take 25 mg by mouth every 6 (six) hours as needed for allergies.   Yes [provider]  EPINEPHrine (EPIPEN) 0.3 mg/0.3 mL SOAJ injection Inject 0.3 mLs (0.3 mg total) into the muscle once. Patient taking differently: Inject 0.3 mg into the muscle once as needed for anaphylaxis. 12/01/12  Yes Erick Colace, NP  famotidine (PEPCID) 20 MG tablet Take 1 tablet (20 mg total) by mouth 2 (two) times daily. 10/28/19  Yes Padgett, Rae Halsted, MD  fish oil-omega-3 fatty acids 1000 MG capsule Take 1 g by mouth daily.   Yes [provider]  latanoprost (XALATAN) 0.005 % ophthalmic solution Place 1 drop into the left eye at bedtime. 09/14/19  Yes [provider]  leflunomide (ARAVA) 10 MG tablet Take 10 mg by mouth daily. 07/24/21  Yes [provider]  metoprolol succinate (TOPROL-XL) 100 MG 24 hr tablet Take 100 mg by mouth daily. 06/18/21  Yes [provider]  montelukast (SINGULAIR) 10 MG tablet Take 1 tablet (10 mg total) by mouth daily. Patient taking differently: Take 10 mg by mouth every evening. 12/06/19  Yes Marcelyn Bruins, MD  Multiple Vitamin (MULTIVITAMIN) tablet Take 1 tablet by mouth daily.   Yes [provider]  timolol  (TIMOPTIC) 0.5 % ophthalmic solution Place 1 drop into both eyes 2 (two) times daily.   Yes [provider]  vitamin C (ASCORBIC ACID) 500 MG tablet Take 500 mg by mouth daily.   Yes [provider]  lisinopril (ZESTRIL) 10 MG tablet Take 10 mg by mouth daily. 07/24/21   [provider]  predniSONE (DELTASONE) 20 MG tablet 2 tabs po daily x 4 days 07/24/16   Arby Barrette, MD  Probiotic Product (PROBIOTIC DAILY PO) Take 1 capsule by mouth daily.    [provider]  traMADol (ULTRAM) 50 MG tablet 1-2 tablets every 6 hours as needed for pain. May take with acetaminophen. Patient not taking: Reported on 10/28/2019 07/24/16   Arby Barrette, MD      Allergies    Cozaar [losartan], Azopt [brinzolamide], and Erythromycin    Review of Systems   Review of Systems  Constitutional:  Negative for chills and fever.  HENT:  Negative for ear pain and sore throat.   Eyes:  Negative for pain and visual disturbance.  Respiratory:  Negative for cough and shortness of breath.   Cardiovascular:  Negative for chest pain and palpitations.  Gastrointestinal:  Negative for abdominal pain and vomiting.  Genitourinary:  Negative for dysuria and hematuria.  Musculoskeletal:  Positive for arthralgias. Negative for back pain.       Left hip pain  Skin:  Positive for wound. Negative for color change, pallor and rash.  Neurological:  Negative for seizures and syncope.  All other systems reviewed and are negative.   Physical Exam Updated Vital Signs BP (!) 146/73   Pulse (!) 121   Temp 97.8 F (36.6 C) (Oral)   Resp (!) 23   SpO2 97%  Physical Exam Vitals and nursing note reviewed.  Constitutional:      General: She is not in acute distress.    Appearance: Normal appearance. She is well-developed. She is not ill-appearing, toxic-appearing or diaphoretic.  HENT:     Head: Normocephalic and atraumatic. No raccoon eyes or Battle's sign.      Comments: 2 to 2.5 cm  laceration just superior to left eyebrow.  Laceration is shaped in a T.  There is surrounding ecchymosis noted.  No pain with EOMs.  Patient is able to raise eyebrow, scratch eyebrow.  She is denying sensory deficits around laceration.    Right Ear: Tympanic membrane normal.     Left Ear: Tympanic membrane normal.     Nose: Nose normal.     Mouth/Throat:     Mouth: Mucous membranes are moist.  Eyes:     General:        Right eye: No discharge.        Left eye: No discharge.     Extraocular Movements: Extraocular movements intact.     Conjunctiva/sclera: Conjunctivae normal.     Pupils: Pupils are equal, round, and reactive to light.  Neck:     Comments: No midline tenderness  of the C-spine.  Right paraspinal tenderness upon palpation. Cardiovascular:     Rate and Rhythm: Normal rate and regular rhythm.     Pulses: Normal pulses.     Heart sounds: No murmur heard. Pulmonary:     Effort: Pulmonary effort is normal. No respiratory distress.     Breath sounds: Normal breath sounds. No wheezing.  Abdominal:     General: Bowel sounds are normal.     Palpations: Abdomen is soft.     Tenderness: There is no abdominal tenderness. There is no guarding.  Musculoskeletal:        General: Tenderness present. No swelling.     Cervical back: Normal range of motion and neck supple. Tenderness present.     Comments: Tenderness to palpation of left pelvis.  Patient has full range of motion of hip in flexion and extension, knee flexion extension.  Posterior tibial pulses full and intact bilaterally.  Muscle strength 5 out of 5.  No sensory deficits along major nerve distributions of upper or lower extremities.  Skin:    General: Skin is warm and dry.     Capillary Refill: Capillary refill takes less than 2 seconds.  Neurological:     Mental Status: She is alert.  Psychiatric:        Mood and Affect: Mood normal.     ED Results / Procedures / Treatments   Labs (all labs ordered are listed, but  only abnormal results are displayed) Labs Reviewed  COMPREHENSIVE METABOLIC PANEL - Abnormal; Notable for the following components:      Result Value   Sodium 134 (*)    Glucose, Bld 145 (*)    All other components within normal limits  CBC WITH DIFFERENTIAL/PLATELET - Abnormal; Notable for the following components:   WBC 12.5 (*)    Neutro Abs 10.4 (*)    All other components within normal limits  URINALYSIS, ROUTINE W REFLEX MICROSCOPIC - Abnormal; Notable for the following components:   Ketones, ur 5 (*)    Protein, ur 30 (*)    Bacteria, UA RARE (*)    All other components within normal limits  TROPONIN I (HIGH SENSITIVITY) - Abnormal; Notable for the following components:   Troponin I (High Sensitivity) 260 (*)    All other components within normal limits    EKG None  Radiology CT PELVIS WO CONTRAST  Result Date: 07/25/2021 CLINICAL DATA:  Hip trauma, fracture suspected, xray done. Fall, left hip injury EXAM: CT PELVIS WITHOUT CONTRAST TECHNIQUE: Multidetector CT imaging of the pelvis was performed following the standard protocol without intravenous contrast. RADIATION DOSE REDUCTION: This exam was performed according to the departmental dose-optimization program which includes automated exposure control, adjustment of the mA and/or kV according to patient size and/or use of iterative reconstruction technique. COMPARISON:  None Available. FINDINGS: Urinary Tract: The visualized inferior pole right kidney is unremarkable. No hydroureter. The bladder is unremarkable. Bowel: Severe sigmoid diverticulosis without superimposed acute inflammatory change. The visualized large and small bowel are otherwise unremarkable. Appendix normal. Vascular/Lymphatic: Mild aortoiliac atherosclerotic calcification. No aortic aneurysm. No pathologic adenopathy within the pelvis. Reproductive:  Uterus unremarkable.  No adnexal masses. Other:  None Musculoskeletal: There are insufficiency fractures of the  sacral ala bilaterally. There is an acute impacted fracture of the left superior pubic ramus. Subtle angular deformity of the inferior pubic ramus (axial image # 110, series 2) suggests a plastic fracture of the structure with fracture fragments in near anatomic alignment. The osseous structures  are diffusely osteopenic. Degenerative changes are seen within the lumbar spine and hips bilaterally. No dislocation. IMPRESSION: 1. Acute impacted fracture of the left superior pubic ramus. 2. Acute plastic fracture of the left inferior pubic ramus with fracture fragments in near anatomic alignment. 3. Bilateral sacral alar insufficiency fractures. 4. Diffuse osteopenia. 5. Severe sigmoid diverticulosis without superimposed acute inflammatory change. Electronically Signed   By: Fidela Salisbury M.D.   On: 07/25/2021 20:22   CT Cervical Spine Wo Contrast  Result Date: 07/25/2021 CLINICAL DATA:  Trauma. EXAM: CT HEAD WITHOUT CONTRAST CT CERVICAL SPINE WITHOUT CONTRAST TECHNIQUE: Multidetector CT imaging of the head and cervical spine was performed following the standard protocol without intravenous contrast. Multiplanar CT image reconstructions of the cervical spine were also generated. RADIATION DOSE REDUCTION: This exam was performed according to the departmental dose-optimization program which includes automated exposure control, adjustment of the mA and/or kV according to patient size and/or use of iterative reconstruction technique. COMPARISON:  None Available. FINDINGS: CT HEAD FINDINGS Brain: There is mild age-related atrophy and moderate chronic microvascular ischemic changes. Old left basal ganglia lacunar infarct. There is no acute intracranial hemorrhage. No mass effect or midline shift. No extra-axial fluid collection. Vascular: No hyperdense vessel or unexpected calcification. Skull: Normal. Negative for fracture or focal lesion. Sinuses/Orbits: The visualized paranasal sinuses and mastoid air cells are clear.  Right globe scleral banding. Other: None CT CERVICAL SPINE FINDINGS Alignment: No acute subluxation. Skull base and vertebrae: No acute fracture. Soft tissues and spinal canal: No prevertebral fluid or swelling. No visible canal hematoma. Disc levels:  No acute findings.  Multilevel degenerative changes. Upper chest: Biapical subpleural scarring. Other: None IMPRESSION: 1. No acute intracranial pathology. 2. Age-related atrophy and chronic microvascular ischemic changes. Old left basal ganglia lacunar infarct. 3. No acute/traumatic cervical spine pathology. Electronically Signed   By: Anner Crete M.D.   On: 07/25/2021 20:17   CT Head Wo Contrast  Result Date: 07/25/2021 CLINICAL DATA:  Trauma. EXAM: CT HEAD WITHOUT CONTRAST CT CERVICAL SPINE WITHOUT CONTRAST TECHNIQUE: Multidetector CT imaging of the head and cervical spine was performed following the standard protocol without intravenous contrast. Multiplanar CT image reconstructions of the cervical spine were also generated. RADIATION DOSE REDUCTION: This exam was performed according to the departmental dose-optimization program which includes automated exposure control, adjustment of the mA and/or kV according to patient size and/or use of iterative reconstruction technique. COMPARISON:  None Available. FINDINGS: CT HEAD FINDINGS Brain: There is mild age-related atrophy and moderate chronic microvascular ischemic changes. Old left basal ganglia lacunar infarct. There is no acute intracranial hemorrhage. No mass effect or midline shift. No extra-axial fluid collection. Vascular: No hyperdense vessel or unexpected calcification. Skull: Normal. Negative for fracture or focal lesion. Sinuses/Orbits: The visualized paranasal sinuses and mastoid air cells are clear. Right globe scleral banding. Other: None CT CERVICAL SPINE FINDINGS Alignment: No acute subluxation. Skull base and vertebrae: No acute fracture. Soft tissues and spinal canal: No prevertebral  fluid or swelling. No visible canal hematoma. Disc levels:  No acute findings.  Multilevel degenerative changes. Upper chest: Biapical subpleural scarring. Other: None IMPRESSION: 1. No acute intracranial pathology. 2. Age-related atrophy and chronic microvascular ischemic changes. Old left basal ganglia lacunar infarct. 3. No acute/traumatic cervical spine pathology. Electronically Signed   By: Anner Crete M.D.   On: 07/25/2021 20:17   DG Hip Unilat W or Wo Pelvis 2-3 Views Left  Result Date: 07/25/2021 CLINICAL DATA:  Pain after a fall EXAM: DG  HIP (WITH OR WITHOUT PELVIS) 2-3V LEFT COMPARISON:  None Available. FINDINGS: AP view of the pelvis and AP/frog leg views of the left hip. Femoral heads are located. Sacroiliac joints are symmetric. Osteopenia. Degenerate disc disease at L4-5. Subtle osseous irregularity in the left parasymphyseal pubic bone IMPRESSION: Suspect a left parasymphyseal pubic bone fracture, minimally displaced. Correlate with point tenderness. CT could confirm. Electronically Signed   By: Abigail Miyamoto M.D.   On: 07/25/2021 17:51    Procedures .Marland KitchenLaceration Repair  Date/Time: 07/25/2021 7:40 PM  Performed by: Wilnette Kales, PA Authorized by: Wilnette Kales, PA   Consent:    Consent obtained:  Verbal   Consent given by:  Patient   Risks, benefits, and alternatives were discussed: yes     Risks discussed:  Infection, nerve damage, need for additional repair, retained foreign body, tendon damage, vascular damage, poor cosmetic result, poor wound healing and pain   Alternatives discussed:  No treatment, delayed treatment, observation and referral Universal protocol:    Procedure explained and questions answered to patient or proxy's satisfaction: yes     Patient identity confirmed:  Verbally with patient Anesthesia:    Anesthesia method:  Local infiltration   Local anesthetic:  Lidocaine 1% w/o epi Laceration details:    Location:  Face   Face location:  L  eyebrow   Length (cm):  2.2   Depth (mm):  5 Pre-procedure details:    Preparation:  Patient was prepped and draped in usual sterile fashion Exploration:    Limited defect created (wound extended): no     Hemostasis achieved with:  Direct pressure   Imaging outcome: foreign body not noted     Wound exploration: wound explored through full range of motion and entire depth of wound visualized     Contaminated: no   Treatment:    Area cleansed with:  Saline   Amount of cleaning:  Extensive   Irrigation solution:  Sterile saline   Irrigation volume:  500 mL   Irrigation method:  Syringe   Visualized foreign bodies/material removed: no     Debridement:  None   Undermining:  None   Scar revision: no   Skin repair:    Repair method:  Sutures   Suture size:  5-0   Suture material:  Nylon   Suture technique:  Simple interrupted   Number of sutures:  4 Approximation:    Approximation:  Close Repair type:    Repair type:  Simple Post-procedure details:    Dressing:  Non-adherent dressing   Procedure completion:  Tolerated well, no immediate complications     Medications Ordered in ED Medications  diltiazem (CARDIZEM) 1 mg/mL load via infusion 10 mg (10 mg Intravenous Bolus from Bag 07/25/21 2143)    And  diltiazem (CARDIZEM) 125 mg in dextrose 5% 125 mL (1 mg/mL) infusion (10 mg/hr Intravenous Rate/Dose Change 07/25/21 2215)  ondansetron (ZOFRAN) injection 4 mg (has no administration in time range)  HYDROcodone-acetaminophen (NORCO/VICODIN) 5-325 MG per tablet 1 tablet (has no administration in time range)  lidocaine (PF) (XYLOCAINE) 1 % injection 5 mL (5 mLs Other Given by Other 07/25/21 1926)    ED Course/ Medical Decision Making/ A&P Clinical Course as of 07/25/21 2215  Wed Jul 25, 2021  2100 Dr. Tamera Punt of orthopedics consulted. He agreed to see the patient tomorrow. Recommended admission with hospital medicine.  [CR]  2115 Dr. Jonelle Sidle of hospitalist consulted regarding  patient.  He agreed with admission interest in further  treatment/care of patient. [CR]  2152 Upon discussion the hospitalist about admission, patient went into atrial fibrillation with a rate of approximately 140.  Patient denies history of atrial fibrillation.  Cardiologist was consulted regarding the patient.  They agreed with admission and will see the patient tomorrow.  In the meantime, agreed with Cardizem drip for rate control and holding anticoagulation. [CR]    Clinical Course User Index [CR] Wilnette Kales, PA                           Medical Decision Making Amount and/or Complexity of Data Reviewed Radiology: ordered.  Risk Prescription drug management.   This patient presents to the ED for concern of fall, this involves an extensive number of treatment options, and is a complaint that carries with it a high risk of complications and morbidity.  The differential diagnosis includes intracranial hemorrhage, arrhythmia, carotid artery stenosis, arrhythmia, mechanical fall, CVA, fracture, dislocation, wound infection   Co morbidities that complicate the patient evaluation  Hyperlipidemia, hypertension, osteoporosis, hyperthyroidism.   Additional history obtained:  Additional history obtained from office note from 07/25/2021 External records from outside source obtained and reviewed including initial presentation of patient regarding head trauma at PCP.   Lab Tests:  I Ordered, and personally interpreted labs.  The pertinent results include: WBC of 12.5,   Imaging Studies ordered:  I ordered imaging studies including CT pelvis, CT head and C-spine, x-ray hip I independently visualized and interpreted imaging which showed  CT pelvis: Acute impacted fracture of left superior pubic ramus, acute plastic fracture of left inferior pubic ramus with fracture fragments in near anatomic alignment.  Bilateral sacral alar insufficiency fractures.  Diffuse osteopenia.  Severe sigmoid  diverticulosis without superimposed acute inflammatory change. CT head and C-spine: No acute intracranial pathology.  Age-related atrophy and chronic microvascular ischemic changes.  Old left basal ganglia lacunar infarct.  No acute traumatic cervical spine pathology. X-ray hip: Suspected left parasymphyseal pubic bone fracture with minimal displacement I agree with the radiologist interpretation  Cardiac Monitoring: / EKG:  The patient was maintained on a cardiac monitor.  I personally viewed and interpreted the cardiac monitored which showed an underlying rhythm of: Sinus rhythm with transformation into atrial fibrillation with rapid ventricular rate.   Consultations Obtained:  I requested consultation with the Dr. Tamera Punt of orthopedics, Dr. Titus Mould of hospital medicine, cardiologist,  and discussed lab and imaging findings as well as pertinent plan - they recommend: Orthopedics agreed to evaluate patient tomorrow, Hospital medicine agreed with admission as well as assuming further treatment/care, cardiologist agreed with Cardizem drip and to hold anticoagulation at this point.   Problem List / ED Course / Critical interventions / Medication management  Fall I ordered medication including Cardizem for rate control, medication for improved anesthetic  Reevaluation of the patient after these medicines showed that the patient improved I have reviewed the patients home medicines and have made adjustments as needed   Social Determinants of Health:  Denies cigarette, alcohol, illicit drug use.   Test / Admission - Considered:  Pelvic fracture Laboratory/imaging studies significant for: Troponin elevation to 260.  Fractures as stated above.  As patient was being admitted, spontaneously went into atrial fibrillation with rapid ventricular rate.  Diltiazem drip was started to control rate given that patient was not spontaneously converting back into normal sinus rhythm.  Prompt consultation  was made regarding cardiology, orthopedics, hospital medicine as discussed above.  Doubt myocardial  infarction given negative EKG findings in the setting of acute trauma and new onset atrial fibrillation.  Repeat EKG as well as trending troponins necessary to trend for changes. Treatment plan was discussed with the patient, patient knowledge understanding and was agreeable to said plan.  Patient was stable upon admission.        Final Clinical Impression(s) / ED Diagnoses Final diagnoses:  Multiple closed fractures of pelvis with stable disruption of pelvic ring, initial encounter [facility])  New onset atrial fibrillation [facility])  Facial laceration, initial encounter    Rx / DC Orders ED Discharge Orders     None         Wilnette Kales, [place] 07/25/21 2215    Malvin Johns, MD 07/25/21 [phone]

## 2021-07-25 NOTE — ED Notes (Signed)
ED Provider at bedside. 

## 2021-07-25 NOTE — ED Provider Triage Note (Signed)
Emergency Medicine Provider Triage Evaluation Note  Emily RenshawJewel C Robertson , a 88 y.o. female  was evaluated in triage.  Pt complains of fall, left forehead laceration.  Patient reports prior to arrival she fell in her kitchen striking the left side of her forehead on the ground along with her left hip.  Patient denies loss of consciousness, nausea, vomiting, blood thinners.  Patient reports that [facility] advised her to report to ER for evaluation.  The patient states that [facility], states that this is her main complaint.  Patient has full range of motion of hip.  No tenderness to palpation.  Patient has no focal neurodeficits on examination.  Patient denies any neck pain, back pain, loss of consciousness.  Review of Systems  Positive:  Negative:   Physical Exam  BP (!) 187/80 (BP Location: Left Arm)   Pulse 86   Temp 97.8 F (36.6 C) (Oral)   Resp 18   SpO2 96%  Gen:   Awake, no distress   Resp:  Normal effort  MSK:   Moves extremities without difficulty  Other:    Medical Decision Making  Medically screening exam initiated at 5:13 PM.  Appropriate orders placed.  Emily Robertson was informed that the remainder of the evaluation will be completed by another provider, this initial triage assessment does not replace that evaluation, and the importance of remaining in the ED until their evaluation is complete.     Al DecantGroce, Keasia Dubose F, PA-C 07/25/21 1714

## 2021-07-26 ENCOUNTER — Inpatient Hospital Stay (HOSPITAL_COMMUNITY): Payer: Medicare PPO

## 2021-07-26 DIAGNOSIS — I10 Essential (primary) hypertension: Secondary | ICD-10-CM

## 2021-07-26 DIAGNOSIS — R7989 Other specified abnormal findings of blood chemistry: Secondary | ICD-10-CM | POA: Diagnosis not present

## 2021-07-26 DIAGNOSIS — I4891 Unspecified atrial fibrillation: Secondary | ICD-10-CM

## 2021-07-26 LAB — CBC
HCT: 37 % (ref 36.0–46.0)
Hemoglobin: 12.9 g/dL (ref 12.0–15.0)
MCH: 30.2 pg (ref 26.0–34.0)
MCHC: 34.9 g/dL (ref 30.0–36.0)
MCV: 86.7 fL (ref 80.0–100.0)
Platelets: 270 10*3/uL (ref 150–400)
RBC: 4.27 MIL/uL (ref 3.87–5.11)
RDW: 13.2 % (ref 11.5–15.5)
WBC: 10.2 10*3/uL (ref 4.0–10.5)
nRBC: 0 % (ref 0.0–0.2)

## 2021-07-26 LAB — LIPID PANEL
Cholesterol: 144 mg/dL (ref 0–200)
HDL: 52 mg/dL (ref >40)
LDL Cholesterol: 82 mg/dL (ref 0–99)
Total CHOL/HDL Ratio: 2.8 RATIO
Triglycerides: 52 mg/dL (ref <150)
VLDL: 10 mg/dL (ref 0–40)

## 2021-07-26 LAB — MAGNESIUM: Magnesium: 1.8 mg/dL (ref 1.7–2.4)

## 2021-07-26 LAB — ECHOCARDIOGRAM COMPLETE
AV Peak grad: 5 mmHg
Ao pk vel: 1.12 m/s
Area-P 1/2: 4.23 cm2
Height: 64 in
MV M vel: 5.83 m/s
MV Peak grad: 136 mmHg
P 1/2 time: 556 msec
S' Lateral: 3 cm
Weight: 2080 oz

## 2021-07-26 LAB — BASIC METABOLIC PANEL
Anion gap: 9 (ref 5–15)
BUN: 18 mg/dL (ref 8–23)
CO2: 23 mmol/L (ref 22–32)
Calcium: 9.3 mg/dL (ref 8.9–10.3)
Chloride: 103 mmol/L (ref 98–111)
Creatinine, Ser: 0.51 mg/dL (ref 0.44–1.00)
GFR, Estimated: >60 mL/min (ref >60)
Glucose, Bld: 143 mg/dL — ABNORMAL HIGH (ref 70–99)
Potassium: 3.3 mmol/L — ABNORMAL LOW (ref 3.5–5.1)
Sodium: 135 mmol/L (ref 135–145)

## 2021-07-26 LAB — TSH: TSH: 0.01 u[IU]/mL — ABNORMAL LOW (ref 0.350–4.500)

## 2021-07-26 LAB — T4, FREE: Free T4: 3.62 ng/dL — ABNORMAL HIGH (ref 0.61–1.12)

## 2021-07-26 MED ORDER — VITAMIN D 25 MCG (1000 UNIT) PO TABS
1000.0000 [IU] | ORAL_TABLET | Freq: Every day | ORAL | Status: DC
Start: 2021-07-26 — End: 2021-07-31
  Administered 2021-07-26 – 2021-07-31 (×6): 1000 [IU] via ORAL
  Filled 2021-07-26 (×6): qty 1

## 2021-07-26 MED ORDER — ADULT MULTIVITAMIN W/MINERALS CH
1.0000 | ORAL_TABLET | Freq: Every day | ORAL | Status: DC
Start: 2021-07-26 — End: 2021-07-31
  Administered 2021-07-26 – 2021-07-31 (×6): 1 via ORAL
  Filled 2021-07-26 (×6): qty 1

## 2021-07-26 MED ORDER — MAGNESIUM SULFATE 2 GM/50ML IV SOLN
2.0000 g | Freq: Once | INTRAVENOUS | Status: AC
  Administered 2021-07-26: 2 g via INTRAVENOUS
  Filled 2021-07-26: qty 50

## 2021-07-26 MED ORDER — ALENDRONATE SODIUM 70 MG PO TABS: 70.0000 mg | ORAL_TABLET | ORAL | Status: DC

## 2021-07-26 MED ORDER — METOPROLOL SUCCINATE ER 100 MG PO TB24
100.0000 mg | ORAL_TABLET | Freq: Every day | ORAL | Status: DC
  Administered 2021-07-27 – 2021-07-31 (×5): 100 mg via ORAL
  Filled 2021-07-26 (×5): qty 1

## 2021-07-26 MED ORDER — LORATADINE 10 MG PO TABS
10.0000 mg | ORAL_TABLET | Freq: Every day | ORAL | Status: DC
  Administered 2021-07-26 – 2021-07-31 (×6): 10 mg via ORAL
  Filled 2021-07-26 (×6): qty 1

## 2021-07-26 MED ORDER — ACETAMINOPHEN 325 MG PO TABS: 650.0000 mg | ORAL_TABLET | ORAL | Status: DC | PRN

## 2021-07-26 MED ORDER — ACETAMINOPHEN 325 MG PO TABS
650.0000 mg | ORAL_TABLET | Freq: Three times a day (TID) | ORAL | Status: DC
  Administered 2021-07-26 – 2021-07-31 (×13): 650 mg via ORAL
  Filled 2021-07-26 (×12): qty 2

## 2021-07-26 MED ORDER — METOPROLOL SUCCINATE ER 50 MG PO TB24
200.0000 mg | ORAL_TABLET | Freq: Every day | ORAL | Status: DC
  Administered 2021-07-26: 200 mg via ORAL
  Filled 2021-07-26: qty 4

## 2021-07-26 MED ORDER — FAMOTIDINE 20 MG PO TABS
20.0000 mg | ORAL_TABLET | Freq: Two times a day (BID) | ORAL | Status: DC
  Administered 2021-07-26 – 2021-07-31 (×11): 20 mg via ORAL
  Filled 2021-07-26 (×11): qty 1

## 2021-07-26 MED ORDER — ASCORBIC ACID 500 MG PO TABS
500.0000 mg | ORAL_TABLET | Freq: Every day | ORAL | Status: DC
  Administered 2021-07-26 – 2021-07-31 (×6): 500 mg via ORAL
  Filled 2021-07-26 (×6): qty 1

## 2021-07-26 MED ORDER — POTASSIUM CHLORIDE CRYS ER 20 MEQ PO TBCR
40.0000 meq | EXTENDED_RELEASE_TABLET | Freq: Once | ORAL | Status: AC
  Administered 2021-07-26: 40 meq via ORAL
  Filled 2021-07-26: qty 2

## 2021-07-26 MED ORDER — ENOXAPARIN SODIUM 40 MG/0.4ML IJ SOSY
40.0000 mg | PREFILLED_SYRINGE | Freq: Every day | INTRAMUSCULAR | Status: DC
  Administered 2021-07-26 – 2021-07-27 (×2): 40 mg via SUBCUTANEOUS
  Filled 2021-07-26 (×2): qty 0.4

## 2021-07-26 MED ORDER — MONTELUKAST SODIUM 10 MG PO TABS
10.0000 mg | ORAL_TABLET | Freq: Every evening | ORAL | Status: DC
  Administered 2021-07-26 – 2021-07-30 (×5): 10 mg via ORAL
  Filled 2021-07-26 (×6): qty 1

## 2021-07-26 MED ORDER — METOPROLOL SUCCINATE ER 50 MG PO TB24: 100.0000 mg | ORAL_TABLET | Freq: Every day | ORAL | Status: DC

## 2021-07-26 MED ORDER — DIPHENHYDRAMINE HCL 25 MG PO CAPS
25.0000 mg | ORAL_CAPSULE | Freq: Four times a day (QID) | ORAL | Status: DC | PRN
Start: 2021-07-26 — End: 2021-07-26

## 2021-07-26 MED ORDER — OMEGA-3-ACID ETHYL ESTERS 1 G PO CAPS
1.0000 g | ORAL_CAPSULE | Freq: Every day | ORAL | Status: DC
  Administered 2021-07-26 – 2021-07-31 (×6): 1 g via ORAL
  Filled 2021-07-26 (×6): qty 1

## 2021-07-26 MED ORDER — LATANOPROST 0.005 % OP SOLN
1.0000 [drp] | Freq: Every day | OPHTHALMIC | Status: DC
Start: 2021-07-26 — End: 2021-07-31
  Administered 2021-07-26 – 2021-07-30 (×5): 1 [drp] via OPHTHALMIC
  Filled 2021-07-26 (×2): qty 2.5

## 2021-07-26 MED ORDER — LISINOPRIL 10 MG PO TABS
10.0000 mg | ORAL_TABLET | Freq: Every day | ORAL | Status: DC
  Administered 2021-07-26 – 2021-07-27 (×2): 10 mg via ORAL
  Filled 2021-07-26 (×2): qty 1

## 2021-07-26 MED ORDER — PREDNISONE 5 MG PO TABS
2.5000 mg | ORAL_TABLET | Freq: Every day | ORAL | Status: DC
  Administered 2021-07-26 – 2021-07-31 (×6): 2.5 mg via ORAL
  Filled 2021-07-26 (×6): qty 1

## 2021-07-26 MED ORDER — TIMOLOL MALEATE 0.5 % OP SOLN
1.0000 [drp] | Freq: Two times a day (BID) | OPHTHALMIC | Status: DC
Start: 2021-07-26 — End: 2021-07-31
  Administered 2021-07-26 – 2021-07-31 (×11): 1 [drp] via OPHTHALMIC
  Filled 2021-07-26 (×2): qty 5

## 2021-07-26 MED ORDER — OMEGA-3 FATTY ACIDS 1000 MG PO CAPS
1.0000 g | ORAL_CAPSULE | Freq: Every day | ORAL | Status: DC
Start: 2021-07-26 — End: 2021-07-26

## 2021-07-26 MED ORDER — ACETAMINOPHEN 500 MG PO TABS
1000.0000 mg | ORAL_TABLET | Freq: Four times a day (QID) | ORAL | Status: DC | PRN
  Administered 2021-07-26 – 2021-07-27 (×2): 1000 mg via ORAL
  Filled 2021-07-26 (×3): qty 2

## 2021-07-26 NOTE — ED Notes (Signed)
Cardiology paged re: HR

## 2021-07-26 NOTE — Consult Note (Signed)
Reason for Consult: Evaluate pelvis injury Referring Physician: Blanchard Mane, MD  Emily Robertson is an 88 y.o. female.  HPI: 88 year old female who lives alone.  She was walking into her house and fell to the floor.  She does not recall tripping.  She had immediate pain in her pelvis and had difficulty ambulating.  She was found in the emergency department by CT scan to have bilateral sacral insufficiency fractures and a nondisplaced left superior ramus fracture.  Past Medical History:  Diagnosis Date   Arthritis    Glaucoma of both eyes    Hyperlipidemia    Hypertension    Pelvic prolapse    anterior floor   Urgency of urination    Urinary hesitancy    Urticaria     Past Surgical History:  Procedure Laterality Date   PUBOVAGINAL SLING N/A 08/31/2012   Procedure: BOSTON SCIENTIFIC UPHOLD LIGHT SACROSPINUS REPAIR;  Surgeon: Kathi Ludwig, MD;  Location: [facility];  Service: Urology;  Laterality: N/A;   RETINAL DETACHMENT SURGERY Right     Family History  Problem Relation Age of Onset   Hypertension Mother    Thyroid disease Neg Hx    Diabetes Neg Hx     Social History:  reports that she has never smoked. She has never used smokeless tobacco. She reports current alcohol use. She reports that she does not use drugs.  Allergies:  Allergies  Allergen Reactions   Cozaar [Losartan]     Tongue swelling   Azopt [Brinzolamide]     Rash on eyelid   Erythromycin Itching    Medications: I have reviewed the patient's current medications.  Results for orders placed or performed during the hospital encounter of 07/25/21 (from the past 48 hour(s))  Urinalysis, Routine w reflex microscopic Urine, Clean Catch     Status: Abnormal   Collection Time: 07/25/21  7:39 PM  Result Value Ref Range   Color, Urine YELLOW YELLOW   APPearance CLEAR CLEAR   Specific Gravity, Urine 1.012 1.005 - 1.030   pH 6.0 5.0 - 8.0   Glucose, UA NEGATIVE NEGATIVE mg/dL    Hgb urine dipstick NEGATIVE NEGATIVE   Bilirubin Urine NEGATIVE NEGATIVE   Ketones, ur 5 (A) NEGATIVE mg/dL   Protein, ur 30 (A) NEGATIVE mg/dL   Nitrite NEGATIVE NEGATIVE   Leukocytes,Ua NEGATIVE NEGATIVE   RBC / HPF 6-10 0 - 5 RBC/hpf   WBC, UA 0-5 0 - 5 WBC/hpf   Bacteria, UA RARE (A) NONE SEEN   Mucus PRESENT    Hyaline Casts, UA PRESENT     Comment: Performed at [facility], [address] [place], [place] [address]  Comprehensive metabolic panel     Status: Abnormal   Collection Time: 07/25/21  8:40 PM  Result Value Ref Range   Sodium 134 (L) 135 - 145 mmol/L   Potassium 3.6 3.5 - 5.1 mmol/L   Chloride 100 98 - 111 mmol/L   CO2 23 22 - 32 mmol/L   Glucose, Bld 145 (H) 70 - 99 mg/dL    Comment: Glucose reference range applies only to samples taken after fasting for at least 8 hours.   BUN 16 8 - 23 mg/dL   Creatinine, Ser 3.22 0.44 - 1.00 mg/dL   Calcium 9.4 8.9 - 02.5 mg/dL   Total Protein 7.2 6.5 - 8.1 g/dL   Albumin 4.0 3.5 - 5.0 g/dL   AST 33 15 - 41 U/L   ALT 23 0 -  44 U/L   Alkaline Phosphatase 95 38 - 126 U/L   Total Bilirubin 1.0 0.3 - 1.2 mg/dL   GFR, Estimated >16 >60 mL/min    Comment: (NOTE) Calculated using the CKD-EPI Creatinine Equation (2021)    Anion gap 11 5 - 15    Comment: Performed at [facility], [address] [place], [place] [address]  CBC with Differential     Status: Abnormal   Collection Time: 07/25/21  8:40 PM  Result Value Ref Range   WBC 12.5 (H) 4.0 - 10.5 K/uL   RBC 4.83 3.87 - 5.11 MIL/uL   Hemoglobin 14.2 12.0 - 15.0 g/dL   HCT 01.0 93.2 - 35.5 %   MCV 87.6 80.0 - 100.0 fL   MCH 29.4 26.0 - 34.0 pg   MCHC 33.6 30.0 - 36.0 g/dL   RDW 73.2 20.2 - 54.2 %   Platelets 285 150 - 400 K/uL   nRBC 0.0 0.0 - 0.2 %   Neutrophils Relative % 84 %   Neutro Abs 10.4 (H) 1.7 - 7.7 K/uL   Lymphocytes Relative 9 %   Lymphs Abs 1.2 0.7 - 4.0 K/uL   Monocytes Relative 7 %   Monocytes Absolute 0.8 0.1 -  1.0 K/uL   Eosinophils Relative 0 %   Eosinophils Absolute 0.0 0.0 - 0.5 K/uL   Basophils Relative 0 %   Basophils Absolute 0.1 0.0 - 0.1 K/uL   Immature Granulocytes 0 %   Abs Immature Granulocytes 0.02 0.00 - 0.07 K/uL    Comment: Performed at [facility], [address] [place], [place] [address]  Troponin I (High Sensitivity)     Status: Abnormal   Collection Time: 07/25/21  8:40 PM  Result Value Ref Range   Troponin I (High Sensitivity) 260 (HH) <18 ng/L    Comment: CRITICAL RESULT CALLED TO, READ BACK BY AND VERIFIED WITH:  BETH CRABTREE RN 07/25/21 @ 2123 VS (NOTE) Elevated high sensitivity troponin I (hsTnI) values and significant  changes across serial measurements may suggest ACS but many other  chronic and acute conditions are known to elevate hsTnI results.  Refer to the Links section for chest pain algorithms and additional  guidance. Performed at [facility], [address], [place], [place] [address]   Troponin I (High Sensitivity)     Status: Abnormal   Collection Time: 07/25/21 10:47 PM  Result Value Ref Range   Troponin I (High Sensitivity) 195 (HH) <18 ng/L    Comment: CRITICAL VALUE NOTED.  VALUE IS CONSISTENT WITH PREVIOUSLY REPORTED AND CALLED VALUE. (NOTE) Elevated high sensitivity troponin I (hsTnI) values and significant  changes across serial measurements may suggest ACS but many other  chronic and acute conditions are known to elevate hsTnI results.  Refer to the Links section for chest pain algorithms and additional  guidance. Performed at [facility], [address] [place], [place] [address]     CT PELVIS WO CONTRAST  Result Date: 07/25/2021 CLINICAL DATA:  Hip trauma, fracture suspected, xray done. Fall, left hip injury EXAM: CT PELVIS WITHOUT CONTRAST TECHNIQUE: Multidetector CT imaging of the pelvis was performed following the standard protocol without intravenous contrast. RADIATION  DOSE REDUCTION: This exam was performed according to the departmental dose-optimization program which includes automated exposure control, adjustment of the mA and/or kV according to patient size and/or use of iterative reconstruction technique. COMPARISON:  None Available. FINDINGS: Urinary Tract: The visualized inferior pole right kidney is  unremarkable. No hydroureter. The bladder is unremarkable. Bowel: Severe sigmoid diverticulosis without superimposed acute inflammatory change. The visualized large and small bowel are otherwise unremarkable. Appendix normal. Vascular/Lymphatic: Mild aortoiliac atherosclerotic calcification. No aortic aneurysm. No pathologic adenopathy within the pelvis. Reproductive:  Uterus unremarkable.  No adnexal masses. Other:  None Musculoskeletal: There are insufficiency fractures of the sacral ala bilaterally. There is an acute impacted fracture of the left superior pubic ramus. Subtle angular deformity of the inferior pubic ramus (axial image # 110, series 2) suggests a plastic fracture of the structure with fracture fragments in near anatomic alignment. The osseous structures are diffusely osteopenic. Degenerative changes are seen within the lumbar spine and hips bilaterally. No dislocation. IMPRESSION: 1. Acute impacted fracture of the left superior pubic ramus. 2. Acute plastic fracture of the left inferior pubic ramus with fracture fragments in near anatomic alignment. 3. Bilateral sacral alar insufficiency fractures. 4. Diffuse osteopenia. 5. Severe sigmoid diverticulosis without superimposed acute inflammatory change. Electronically Signed   By: Fidela Salisbury M.D.   On: 07/25/2021 20:22   CT Cervical Spine Wo Contrast  Result Date: 07/25/2021 CLINICAL DATA:  Trauma. EXAM: CT HEAD WITHOUT CONTRAST CT CERVICAL SPINE WITHOUT CONTRAST TECHNIQUE: Multidetector CT imaging of the head and cervical spine was performed following the standard protocol without intravenous contrast.  Multiplanar CT image reconstructions of the cervical spine were also generated. RADIATION DOSE REDUCTION: This exam was performed according to the departmental dose-optimization program which includes automated exposure control, adjustment of the mA and/or kV according to patient size and/or use of iterative reconstruction technique. COMPARISON:  None Available. FINDINGS: CT HEAD FINDINGS Brain: There is mild age-related atrophy and moderate chronic microvascular ischemic changes. Old left basal ganglia lacunar infarct. There is no acute intracranial hemorrhage. No mass effect or midline shift. No extra-axial fluid collection. Vascular: No hyperdense vessel or unexpected calcification. Skull: Normal. Negative for fracture or focal lesion. Sinuses/Orbits: The visualized paranasal sinuses and mastoid air cells are clear. Right globe scleral banding. Other: None CT CERVICAL SPINE FINDINGS Alignment: No acute subluxation. Skull base and vertebrae: No acute fracture. Soft tissues and spinal canal: No prevertebral fluid or swelling. No visible canal hematoma. Disc levels:  No acute findings.  Multilevel degenerative changes. Upper chest: Biapical subpleural scarring. Other: None IMPRESSION: 1. No acute intracranial pathology. 2. Age-related atrophy and chronic microvascular ischemic changes. Old left basal ganglia lacunar infarct. 3. No acute/traumatic cervical spine pathology. Electronically Signed   By: Anner Crete M.D.   On: 07/25/2021 20:17   CT Head Wo Contrast  Result Date: 07/25/2021 CLINICAL DATA:  Trauma. EXAM: CT HEAD WITHOUT CONTRAST CT CERVICAL SPINE WITHOUT CONTRAST TECHNIQUE: Multidetector CT imaging of the head and cervical spine was performed following the standard protocol without intravenous contrast. Multiplanar CT image reconstructions of the cervical spine were also generated. RADIATION DOSE REDUCTION: This exam was performed according to the departmental dose-optimization program which  includes automated exposure control, adjustment of the mA and/or kV according to patient size and/or use of iterative reconstruction technique. COMPARISON:  None Available. FINDINGS: CT HEAD FINDINGS Brain: There is mild age-related atrophy and moderate chronic microvascular ischemic changes. Old left basal ganglia lacunar infarct. There is no acute intracranial hemorrhage. No mass effect or midline shift. No extra-axial fluid collection. Vascular: No hyperdense vessel or unexpected calcification. Skull: Normal. Negative for fracture or focal lesion. Sinuses/Orbits: The visualized paranasal sinuses and mastoid air cells are clear. Right globe scleral banding. Other: None CT CERVICAL SPINE FINDINGS Alignment: No  acute subluxation. Skull base and vertebrae: No acute fracture. Soft tissues and spinal canal: No prevertebral fluid or swelling. No visible canal hematoma. Disc levels:  No acute findings.  Multilevel degenerative changes. Upper chest: Biapical subpleural scarring. Other: None IMPRESSION: 1. No acute intracranial pathology. 2. Age-related atrophy and chronic microvascular ischemic changes. Old left basal ganglia lacunar infarct. 3. No acute/traumatic cervical spine pathology. Electronically Signed   By: Anner Crete M.D.   On: 07/25/2021 20:17   DG Hip Unilat W or Wo Pelvis 2-3 Views Left  Result Date: 07/25/2021 CLINICAL DATA:  Pain after a fall EXAM: DG HIP (WITH OR WITHOUT PELVIS) 2-3V LEFT COMPARISON:  None Available. FINDINGS: AP view of the pelvis and AP/frog leg views of the left hip. Femoral heads are located. Sacroiliac joints are symmetric. Osteopenia. Degenerate disc disease at L4-5. Subtle osseous irregularity in the left parasymphyseal pubic bone IMPRESSION: Suspect a left parasymphyseal pubic bone fracture, minimally displaced. Correlate with point tenderness. CT could confirm. Electronically Signed   By: Abigail Miyamoto M.D.   On: 07/25/2021 17:51    Review of Systems  All other  systems reviewed and are negative.  Blood pressure 139/61, pulse 91, temperature 97.8 F (36.6 C), temperature source Oral, resp. rate 14, SpO2 95 %. Physical Exam Eyes:     Extraocular Movements: Extraocular movements intact.  Cardiovascular:     Pulses: Normal pulses.  Pulmonary:     Effort: Pulmonary effort is normal.  Musculoskeletal:     Comments: Bilateral lower extremities with normal alignment.  Mild discomfort with logroll both sides.  No significant pain with foot tap.  She is neurovascularly intact bilateral lower extremities.  She has some tenderness in the posterior sacrum, low back.  Neurological:     Mental Status: She is alert.  Psychiatric:        Mood and Affect: Mood normal.     Assessment/Plan: Bilateral sacral insufficiency fractures, left-sided pubic ramus fracture I spoke with the patient and her son about diagnosis prognosis and treatment options.  These will likely do well with conservative management.  The goal will be for early mobilization to prevent complications of bedrest.  She can be weightbearing as tolerated with a walker.  She is likely going to need a higher level of care than what she will be able to get at home with additional therapy.  She can follow-up in my office in the next 2 to 3 weeks for recheck and repeat x-rays.  Rhae Hammock 07/26/2021, 9:05 AM

## 2021-07-26 NOTE — Consult Note (Addendum)
Cardiology Consultation:   Patient ID: Emily Robertson MRN: [id number]; DOB: 1930-02-25  Admit date: 07/25/2021 Date of Consult: 07/26/2021  PCP:  [name], [place] [facility] Cardiologist:  New (Dr. Stanford Breed)  Patient Profile:   Emily Robertson is a 88 y.o. female with a history of hypertension, hyperlipidemia rheumatoid arthritis, and glaucoma who is being seen today for evaluation of new onset atrial fibrillation at the request of Dion Saucier, PA-C in the ED.  History of Present Illness:   Emily Robertson is a 88 year old female with the above history.  No known cardiac history prior to this admission.  She presented to the [facility] ED on 07/25/2021 at the recommendation of PCP for further evaluation of right hip pain and head laceration after a fall.  CT showed acute impacted fracture of the left superior pubic ramus and acute plastic fracture of the left anterior pubic ramus with fracture fragments in near anatomic alignment as well as bilateral sacral alar insufficiency fractures.  Head CT showed no acute findings. Upon arrival to the ED, she was also noted to be in new onset atrial fibrillation with rates as high as 150s.  EKG showed atrial fibrillation, rate 137 bpm, with mild diffuse ST depression noted in inferior leads and leads V3-V5 (possibly rate related).  High-sensitivity troponin 260 >> 195. WBC 12.5, Hgb 14.2, Plts 285. Na 134, K 3.6, Glucose 145, BUN 16, Cr 0.65. LFTs normal.  Patient was admitted and both Cardiology and Ortho were consulted.  As on his evaluation, patient is resting comfortably in no acute distress.  She remains in atrial fibrillation but rates controlled in the 80s to 90s on IV Cardizem drip.  She states she was walking her kitchen to her living room yesterday and fell.  She has some balance issues and it sounds like she probably tripped but she does not know for sure.  She denies any symptoms prior to her fall including  palpitations, lightheadedness, dizziness, or chest pain.  She fell on her left side injuring both her head and her left buttock did not lose consciousness.  She states she has had multiple falls (at least 4) over the last year.  She ambulates with a cane.   She reports some dyspnea on exertion with activities around the house over the last year and states she feels winded with this but no shortness of breath at rest.  No orthopnea, PND, lower extremity edema.  She denies any history of chest pain, palpitations, lightheadedness, dizziness.  She she was completely unaware of her tachyarrhythmia.  She denies any recent fevers or illnesses.  No abnormal bleeding in urine or stools.  It does not sound like any surgery is planned for her pelvic fractures.  Of note, patient markedly elevated on arrival to ED with BP as high as the 180s/100s. It sounds like BP has been uncontrolled at home. Patient states PCP recently started her on Lisinopril.  Past Medical History:  Diagnosis Date   Arthritis    Glaucoma of both eyes    Hyperlipidemia    Hypertension    Pelvic prolapse    anterior floor   Urgency of urination    Urinary hesitancy    Urticaria     Past Surgical History:  Procedure Laterality Date   PUBOVAGINAL SLING N/A 08/31/2012   Procedure: B[employer] SCIENTIFIC UPHOLD LIGHT [place];  Surgeon: Ailene Rud, MD;  Location[facility];  Service: Urology;  Laterality: N/A;  RETINAL DETACHMENT SURGERY Right      Home Medications:  Prior to Admission medications   Medication Sig Start Date End Date Taking? Authorizing Provider  acetaminophen (TYLENOL) 500 MG tablet Take 2 tablets (1,000 mg total) by mouth every 6 (six) hours as needed. Patient taking differently: Take 1,000 mg by mouth every 6 (six) hours as needed for moderate pain. 07/24/16  Yes Charlesetta Shanks, MD  alendronate (FOSAMAX) 70 MG tablet Take 1 tablet by mouth once a week. wednesday 08/10/14  Yes  [provider]  Calcium Carb-Cholecalciferol (CALCIUM 500 +D PO) Take 1 tablet by mouth daily.   Yes [provider]  cetirizine (ZYRTEC) 10 MG tablet Take 1 tablet (10 mg total) by mouth 2 (two) times daily. 10/28/19  Yes Padgett, Rae Halsted, MD  Cholecalciferol (VITAMIN D3) 1000 UNITS CAPS Take 1 capsule by mouth daily.   Yes [provider]  diphenhydrAMINE (BENADRYL) 25 MG tablet Take 25 mg by mouth every 6 (six) hours as needed for allergies.   Yes [provider]  EPINEPHrine (EPIPEN) 0.3 mg/0.3 mL SOAJ injection Inject 0.3 mLs (0.3 mg total) into the muscle once. Patient taking differently: Inject 0.3 mg into the muscle once as needed for anaphylaxis. 12/01/12  Yes Erick Colace, NP  famotidine (PEPCID) 20 MG tablet Take 1 tablet (20 mg total) by mouth 2 (two) times daily. 10/28/19  Yes Padgett, Rae Halsted, MD  fish oil-omega-3 fatty acids 1000 MG capsule Take 1 g by mouth daily.   Yes [provider]  latanoprost (XALATAN) 0.005 % ophthalmic solution Place 1 drop into the left eye at bedtime. 09/14/19  Yes [provider]  leflunomide (ARAVA) 10 MG tablet Take 10 mg by mouth daily. 07/24/21  Yes [provider]  LYSINE PO Take 1 capsule by mouth daily.   Yes [provider]  metoprolol succinate (TOPROL-XL) 100 MG 24 hr tablet Take 100 mg by mouth daily. 06/18/21  Yes [provider]  Misc Natural Products (OSTEO BI-FLEX JOINT SHIELD PO) Take 1 capsule by mouth daily.   Yes [provider]  montelukast (SINGULAIR) 10 MG tablet Take 1 tablet (10 mg total) by mouth daily. Patient taking differently: Take 10 mg by mouth every evening. 12/06/19  Yes Kennith Gain, MD  Multiple Vitamin (MULTIVITAMIN) tablet Take 1 tablet by mouth daily.   Yes [provider]  Probiotic Product (PROBIOTIC DAILY PO) Take 1 capsule by mouth daily.   Yes [provider]  timolol (TIMOPTIC)  0.5 % ophthalmic solution Place 1 drop into both eyes 2 (two) times daily.   Yes [provider]  vitamin C (ASCORBIC ACID) 500 MG tablet Take 500 mg by mouth daily.   Yes [provider]  lisinopril (ZESTRIL) 10 MG tablet Take 10 mg by mouth daily. 07/24/21   [provider]  predniSONE (DELTASONE) 20 MG tablet 2 tabs po daily x 4 days Patient not taking: Reported on 07/25/2021 07/24/16   Charlesetta Shanks, MD  traMADol (ULTRAM) 50 MG tablet 1-2 tablets every 6 hours as needed for pain. May take with acetaminophen. Patient not taking: Reported on 10/28/2019 07/24/16   Charlesetta Shanks, MD    Inpatient Medications: Scheduled Meds:  vitamin C  500 mg Oral Daily   cholecalciferol  1,000 Units Oral Daily   enoxaparin (LOVENOX) injection  40 mg Subcutaneous Q24H   famotidine  20 mg Oral BID   latanoprost  1 drop Left Eye QHS   lisinopril  10 mg Oral Daily   loratadine  10 mg Oral Daily   metoprolol succinate  100 mg Oral Daily   montelukast  10 mg Oral QPM   multivitamin with minerals  1 tablet Oral Daily   omega-3 acid ethyl esters  1 g Oral Daily   predniSONE  2.5 mg Oral Q breakfast   timolol  1 drop Both Eyes BID   Continuous Infusions:  diltiazem (CARDIZEM) infusion 12.5 mg/hr (07/26/21 0721)   PRN Meds: acetaminophen, acetaminophen, diphenhydrAMINE, HYDROcodone-acetaminophen, ondansetron (ZOFRAN) IV  Allergies:    Allergies  Allergen Reactions   Cozaar [Losartan]     Tongue swelling   Azopt [Brinzolamide]     Rash on eyelid   Erythromycin Itching    Social History:   Social History   Socioeconomic History   Marital status: Widowed    Spouse name: Not on file   Number of children: Not on file   Years of education: Not on file   Highest education level: Not on file  Occupational History   Not on file  Tobacco Use   Smoking status: Never   Smokeless tobacco: Never  Substance and Sexual Activity   Alcohol use: Yes    Comment: rare   Drug use:  No   Sexual activity: Not on file  Other Topics Concern   Not on file  Social History Narrative   Not on file   Social Determinants of Health   Financial Resource Strain: Not on file  Food Insecurity: Not on file  Transportation Needs: Not on file  Physical Activity: Not on file  Stress: Not on file  Social Connections: Not on file  Intimate Partner Violence: Not on file    Family History:   Family History  Problem Relation Age of Onset   Hypertension Mother    Thyroid disease Neg Hx    Diabetes Neg Hx      ROS:  Please see the history of present illness.  Review of Systems  Constitutional:  Negative for fever.  HENT:  Negative for congestion.   Respiratory:  Positive for shortness of breath. Negative for cough.   Cardiovascular:  Negative for chest pain, palpitations, orthopnea, leg swelling and PND.  Gastrointestinal:  Negative for blood in stool, melena, nausea and vomiting.  Genitourinary:  Negative for hematuria.  Musculoskeletal:  Positive for falls and joint pain.  Neurological:  Negative for dizziness and loss of consciousness.  Endo/Heme/Allergies:  Does not bruise/bleed easily.  Psychiatric/Behavioral:  Negative for substance abuse.    Physical Exam/Data:   Vitals:   07/26/21 0415 07/26/21 0500 07/26/21 0530 07/26/21 0600  BP: 140/82 130/68 126/61 139/61  Pulse: 89 92 74 91  Resp: (!) 23 18 17 14   Temp:      TempSrc:      SpO2: 94% 95% 97% 95%   No intake or output data in the 24 hours ending 07/26/21 0821    10/28/2019    8:34 AM 07/24/2016    7:58 AM 09/01/2013    1:34 PM  Last 3 Weights  Weight (lbs) 123 lb 6.4 oz 126 lb 126 lb 6.4 oz  Weight (kg) 55.974 kg 57.153 kg 57.335 kg     There is no height or weight on file to calculate BMI.  General: 88 y.o. thin Caucasian female resting comfortably in no acute distress.  HEENT: Normocephalic and atraumatic. Sclera clear.  Neck: Supple. No carotid bruits. No JVD. Heart: Irregularly irregular rhythm  with normal rate. III/VI systolic  murmur noted along left sternal border. Lungs: No increased work of breathing. Clear to ausculation bilaterally. No wheezes, rhonchi, or rales.  Abdomen: Soft, non-distended, and non-tender to palpation. Bowel sounds present.  Extremities: No lower extremity edema.    Skin: Warm and dry. Ecchymosis above left eye with small laceration that has been sutured. Neuro: Alert and oriented x3. No focal deficits. Psych: Normal affect. Responds appropriately.  EKG:  The EKG was personally reviewed and demonstrates:  Atrial fibrillation, rate 137 bpm, with mild diffuse ST depression noted in inferior leads and leads V3-V5 (possibly rate related). QTc 508 ms.  Telemetry:  Telemetry was personally reviewed and demonstrates:  Atrial fibrillation. Rates initially as high as the 140s to 150s but now in the 80s to 90s. PVCs and a couple of short runs of NSVT (longest run 3 beats).  Relevant CV Studies: N/A.  Laboratory Data:  High Sensitivity Troponin:   Recent Labs  Lab 07/25/21 2040 07/25/21 2247  TROPONINIHS 260* 195*     Chemistry Recent Labs  Lab 07/25/21 2040  NA 134*  K 3.6  CL 100  CO2 23  GLUCOSE 145*  BUN 16  CREATININE 0.65  CALCIUM 9.4  GFRNONAA >60  ANIONGAP 11    Recent Labs  Lab 07/25/21 2040  PROT 7.2  ALBUMIN 4.0  AST 33  ALT 23  ALKPHOS 95  BILITOT 1.0   Lipids No results for input(s): "CHOL", "TRIG", "HDL", "LABVLDL", "LDLCALC", "CHOLHDL" in the last 168 hours.  Hematology Recent Labs  Lab 07/25/21 2040  WBC 12.5*  RBC 4.83  HGB 14.2  HCT 42.3  MCV 87.6  MCH 29.4  MCHC 33.6  RDW 13.2  PLT 285   Thyroid No results for input(s): "TSH", "FREET4" in the last 168 hours.  BNPNo results for input(s): "BNP", "PROBNP" in the last 168 hours.  DDimer No results for input(s): "DDIMER" in the last 168 hours.   Radiology/Studies:  CT PELVIS WO CONTRAST  Result Date: 07/25/2021 CLINICAL DATA:  Hip trauma, fracture  suspected, xray done. Fall, left hip injury EXAM: CT PELVIS WITHOUT CONTRAST TECHNIQUE: Multidetector CT imaging of the pelvis was performed following the standard protocol without intravenous contrast. RADIATION DOSE REDUCTION: This exam was performed according to the departmental dose-optimization program which includes automated exposure control, adjustment of the mA and/or kV according to patient size and/or use of iterative reconstruction technique. COMPARISON:  None Available. FINDINGS: Urinary Tract: The visualized inferior pole right kidney is unremarkable. No hydroureter. The bladder is unremarkable. Bowel: Severe sigmoid diverticulosis without superimposed acute inflammatory change. The visualized large and small bowel are otherwise unremarkable. Appendix normal. Vascular/Lymphatic: Mild aortoiliac atherosclerotic calcification. No aortic aneurysm. No pathologic adenopathy within the pelvis. Reproductive:  Uterus unremarkable.  No adnexal masses. Other:  None Musculoskeletal: There are insufficiency fractures of the sacral ala bilaterally. There is an acute impacted fracture of the left superior pubic ramus. Subtle angular deformity of the inferior pubic ramus (axial image # 110, series 2) suggests a plastic fracture of the structure with fracture fragments in near anatomic alignment. The osseous structures are diffusely osteopenic. Degenerative changes are seen within the lumbar spine and hips bilaterally. No dislocation. IMPRESSION: 1. Acute impacted fracture of the left superior pubic ramus. 2. Acute plastic fracture of the left inferior pubic ramus with fracture fragments in near anatomic alignment. 3. Bilateral sacral alar insufficiency fractures. 4. Diffuse osteopenia. 5. Severe sigmoid diverticulosis without superimposed acute inflammatory change. Electronically Signed   By: Lyda Kalata.D.  On: 07/25/2021 20:22   CT Cervical Spine Wo Contrast  Result Date: 07/25/2021 CLINICAL DATA:   Trauma. EXAM: CT HEAD WITHOUT CONTRAST CT CERVICAL SPINE WITHOUT CONTRAST TECHNIQUE: Multidetector CT imaging of the head and cervical spine was performed following the standard protocol without intravenous contrast. Multiplanar CT image reconstructions of the cervical spine were also generated. RADIATION DOSE REDUCTION: This exam was performed according to the departmental dose-optimization program which includes automated exposure control, adjustment of the mA and/or kV according to patient size and/or use of iterative reconstruction technique. COMPARISON:  None Available. FINDINGS: CT HEAD FINDINGS Brain: There is mild age-related atrophy and moderate chronic microvascular ischemic changes. Old left basal ganglia lacunar infarct. There is no acute intracranial hemorrhage. No mass effect or midline shift. No extra-axial fluid collection. Vascular: No hyperdense vessel or unexpected calcification. Skull: Normal. Negative for fracture or focal lesion. Sinuses/Orbits: The visualized paranasal sinuses and mastoid air cells are clear. Right globe scleral banding. Other: None CT CERVICAL SPINE FINDINGS Alignment: No acute subluxation. Skull base and vertebrae: No acute fracture. Soft tissues and spinal canal: No prevertebral fluid or swelling. No visible canal hematoma. Disc levels:  No acute findings.  Multilevel degenerative changes. Upper chest: Biapical subpleural scarring. Other: None IMPRESSION: 1. No acute intracranial pathology. 2. Age-related atrophy and chronic microvascular ischemic changes. Old left basal ganglia lacunar infarct. 3. No acute/traumatic cervical spine pathology. Electronically Signed   By: Elgie Collard M.D.   On: 07/25/2021 20:17   CT Head Wo Contrast  Result Date: 07/25/2021 CLINICAL DATA:  Trauma. EXAM: CT HEAD WITHOUT CONTRAST CT CERVICAL SPINE WITHOUT CONTRAST TECHNIQUE: Multidetector CT imaging of the head and cervical spine was performed following the standard protocol without  intravenous contrast. Multiplanar CT image reconstructions of the cervical spine were also generated. RADIATION DOSE REDUCTION: This exam was performed according to the departmental dose-optimization program which includes automated exposure control, adjustment of the mA and/or kV according to patient size and/or use of iterative reconstruction technique. COMPARISON:  None Available. FINDINGS: CT HEAD FINDINGS Brain: There is mild age-related atrophy and moderate chronic microvascular ischemic changes. Old left basal ganglia lacunar infarct. There is no acute intracranial hemorrhage. No mass effect or midline shift. No extra-axial fluid collection. Vascular: No hyperdense vessel or unexpected calcification. Skull: Normal. Negative for fracture or focal lesion. Sinuses/Orbits: The visualized paranasal sinuses and mastoid air cells are clear. Right globe scleral banding. Other: None CT CERVICAL SPINE FINDINGS Alignment: No acute subluxation. Skull base and vertebrae: No acute fracture. Soft tissues and spinal canal: No prevertebral fluid or swelling. No visible canal hematoma. Disc levels:  No acute findings.  Multilevel degenerative changes. Upper chest: Biapical subpleural scarring. Other: None IMPRESSION: 1. No acute intracranial pathology. 2. Age-related atrophy and chronic microvascular ischemic changes. Old left basal ganglia lacunar infarct. 3. No acute/traumatic cervical spine pathology. Electronically Signed   By: Elgie Collard M.D.   On: 07/25/2021 20:17   DG Hip Unilat W or Wo Pelvis 2-3 Views Left  Result Date: 07/25/2021 CLINICAL DATA:  Pain after a fall EXAM: DG HIP (WITH OR WITHOUT PELVIS) 2-3V LEFT COMPARISON:  None Available. FINDINGS: AP view of the pelvis and AP/frog leg views of the left hip. Femoral heads are located. Sacroiliac joints are symmetric. Osteopenia. Degenerate disc disease at L4-5. Subtle osseous irregularity in the left parasymphyseal pubic bone IMPRESSION: Suspect a left  parasymphyseal pubic bone fracture, minimally displaced. Correlate with point tenderness. CT could confirm. Electronically Signed   By: Ronaldo Miyamoto  Jobe Igo M.D.   On: 07/25/2021 17:51     Assessment and Plan:   New Onset Atrial Fibrillation Patient presented for further evaluation of hip pain and head laceration after a fall and was found to be in new onset atrial fibrillation. Rates as high as the 140s to 150s. Patient was started on IV Cardizem with improvement in rates. - Still in atrial fibrillation but rates in the 80s to 90s. - Potassium 3.6. Will replete. Goal >4.0.  - Will check Magnesium and TSH. - Will check Echo. - Continue IV Cardizem for now.  - Will increase home Toprol-XL to 200mg  daily. - If EF is normal on Echo, can plan to transition to PO Cardizem. - CHA2DS2-VASc = 5 (atherosclerotic calcification of aorta and coronary arteries noted on CT in 2022, HTN, age x2, female). However, patient has had multiple falls over the last year so she may not be a good long term anticoagulation candidate. Started the discussion of stroke risk vs bleed risk. Will discuss with MD.  Elevated Troponin High-sensitivity troponin elevated at 260 >> 195. Not consistent with ACS. EKG did shows some ST depression in inferior and anterior leads. Possibly rate related.  - No chest pain. - Will check Echo as above. - Will recheck EKG now that rate is mor controlled. Troponin trend is not consistent with ACS. Consistent with demand ischemia secondary to atrial fibrillation with RVR and marked hypertension. Do not anticipate any ischemic evaluation but will wait on Echo.   Hypertension Patient markedly hypertensive on arrival with BP as high as the 180s/100s. It sounds like BP is uncontrolled at home. PCP recently started her on Lisinopril. BP currently well controlled on IV Cardizem. - Continue IV Cardizem for now. - Currently home medications include Toprol-XL 100mg  daily and Lisinopril 10mg  daily. Will  increase Toprol-XL to 200mg  daily for further rate control. Continue current dose of Lisinopril for now but this may need to be uptitrated as well.  Murmur Patient has a systolic murmur on exam. - Will evaluate with Echo.  Otherwise, per primary team: - Pelvic fractures - Osteoporosis  - Hypothyroidism - Rheumatoid arthritis   Risk Assessment/Risk Scores:    CHA2DS2-VASc Score = 5  This indicates a 7.2% annual risk of stroke. The patient's score is based upon: CHF History: 0 HTN History: 1 Diabetes History: 0 Stroke History: 0 Vascular Disease History: 1 Age Score: 2 Gender Score: 1    For questions or updates, please contact [place] Please consult [email or web address] for contact info under    Signed, Darreld Mclean, PA-C  07/26/2021 8:21 AM As above, patient seen and examined.  Briefly she is an 88 year old female with past medical history of rheumatoid arthritis, hypertension, hyperlipidemia for evaluation of atrial fibrillation.  No prior cardiac history.  Patient has had multiple falls in the past 6 months (4-5 times per her son).  She fell again on June 14 and complained of right hip pain and also was noted to have a head laceration.  She was found to have a left superior pubic ramus fracture.  She was also noted to be in atrial fibrillation and cardiology asked to evaluate.  She does note some mild increased dyspnea on exertion and fatigue but denies chest pain, palpitations.  She also states that she lost her balance and did not have syncope.  On exam she has a 2/6 to 3/6 systolic murmur apex. Electrocardiogram shows atrial fibrillation with rapid ventricular response.  Troponin 260 and 195.  Hemoglobin  12.9.  Creatinine 0.51.  1 newly diagnosed atrial fibrillation-duration of atrial fibrillation is unknown.  Continue home dose of Toprol for rate control and Cardizem has been added.  Will transition to oral Cardizem once it is clear blood pressure and heart rate are  stable.  Issue of anticoagulation is difficult.  Her CHA2DS2-VASc is 7 and she would clearly benefit from anticoagulation long-term to reduce the risk of embolic event including CVA.  However she has fallen 4-5 times in the past 6 months.  She suffered pubic ramus fracture and has a head laceration this time.  I am concerned the risk of anticoagulation outweighs the benefit and discussed this at length with she and her son.  For now we will not anticoagulate but can reassess as he thinks she may be going to an assisted living facility and may have help.  We will make this decision as an outpatient.  Await echocardiogram and TSH.  2 mitral regurgitation murmur-await results of echocardiogram.  3 elevated troponin-patient denies chest pain and no diagnostic ECG changes.  Minimal elevation with no clear trend and not consistent with acute coronary syndrome.  No plans for further cardiac evaluation.  4 hypertension-plan.  Continue present medications and follow blood pressure.  We will adjust based on follow-up readings.  Olga Millers, MD

## 2021-07-26 NOTE — Progress Notes (Signed)
PROGRESS NOTE    Emily Robertson  FBP:[id number] DOB: May 23, 1930 DOA: 07/25/2021 PCP: Rogers Blocker, FNP   Brief Narrative:88 yo female  lives alone was walking from kitchen to the den and heat left upper forehead and has sutures on the forehead.   She complains of some shortness of breath denies any chest pain.  She was also found to be in A-fib RVR in the ER which is new for her. She has closed pelvic fracture bilateral sacral insufficiency fracture and a nondisplaced left superior ramus fracture. Her history significant for hypertension hyperlipidemia glaucoma and arthritis.  Assessment & Plan:   Principal Problem:   New onset a-fib [facility]) Active Problems:   HTN (hypertension)   Hyperthyroidism   Osteoporosis   Rheumatoid arthritis (HCC)   Pure hypercholesterolemia   Fall at home, initial encounter   Closed pelvic fracture (HCC)  #1 pelvic fracture status post fall bilateral sacral insufficiency fractures and left-sided pubic Ramey fracture.  Seen by Ortho. PT OT consult. Weightbearing as tolerated. Follow-up with Ortho in 2 to 3 weeks for recheck and repeat x-rays.  #2 new onset A-fib seen by cardiology appreciate their input.  Patient was started on Cardizem. Echo pending. TSH 0.010 check free T4 Toprol dose increased by cardiology to 200 mg daily. Not on anticoagulation yet due to risk of falls Troponin elevated and trending down.  #3 essential hypertension continue Cardizem and Toprol On lisinopril 10 mg daily  #4 history of rheumatoid arthritis and osteoporosis on prednisone 2.5 mg daily  #6 hyperlipidemia on Lovaza  #7 glaucoma continue Xalatan eyedrops  #8 seasonal allergies on Claritin and Singulair  #9 hypokalemia mag 1.8 replete both   Estimated body mass index is 22.31 kg/m as calculated from the following:   Height as of this encounter: 5\' 4"  (1.626 m).   Weight as of this encounter: 59 kg.  DVT prophylaxis: Lovenox Code Status: full Family  Communication: will call son Disposition Plan:  Status is: Inpatient Remains inpatient appropriate because: A-fib RVR   Consultants:  Cardiology and Ortho  Procedures: None Antimicrobials: None  Subjective: Patient resting in bed she is awake alert and oriented she complains of some shortness of breath denies any chest pain rate controlled in 80s to 90s in A-fib  Objective: Vitals:   07/26/21 0600 07/26/21 0830 07/26/21 0900 07/26/21 0929  BP: 139/61 (!) 129/59 136/83   Pulse: 91 87 97   Resp: 14 (!) 21 (!) 33   Temp:      TempSrc:      SpO2: 95% 97% 96%   Weight:    59 kg  Height:    5\' 4"  (1.626 m)   No intake or output data in the 24 hours ending 07/26/21 0947 Filed Weights   07/26/21 0929  Weight: 59 kg    Examination:  General exam: Appears in mild distress due to pain  respiratory system: Diminished breath sounds at the bases to auscultation. Respiratory effort normal. Cardiovascular system: S1 & S2 heard, RRR. No JVD, murmurs, rubs, gallops or clicks. No pedal edema. Gastrointestinal system: Abdomen is nondistended, soft and nontender. No organomegaly or masses felt. Normal bowel sounds heard. Central nervous system: Alert and oriented. No focal neurological deficits. Extremities: Symmetric 5 x 5 power. Skin: No rashes, lesions or ulcers Psychiatry: Judgement and insight appear normal. Mood & affect appropriate.     Data Reviewed: I have personally reviewed following labs and imaging studies  CBC: Recent Labs  Lab 07/25/21 2040 07/26/21  0923  WBC 12.5* 10.2  NEUTROABS 10.4*  --   HGB 14.2 12.9  HCT 42.3 37.0  MCV 87.6 86.7  PLT 285 270   Basic Metabolic Panel: Recent Labs  Lab 07/25/21 2040  NA 134*  K 3.6  CL 100  CO2 23  GLUCOSE 145*  BUN 16  CREATININE 0.65  CALCIUM 9.4   GFR: Estimated Creatinine Clearance: 41.2 mL/min (by C-G formula based on SCr of 0.65 mg/dL). Liver Function Tests: Recent Labs  Lab 07/25/21 2040  AST 33  ALT  23  ALKPHOS 95  BILITOT 1.0  PROT 7.2  ALBUMIN 4.0   No results for input(s): "LIPASE", "AMYLASE" in the last 168 hours. No results for input(s): "AMMONIA" in the last 168 hours. Coagulation Profile: No results for input(s): "INR", "PROTIME" in the last 168 hours. Cardiac Enzymes: No results for input(s): "CKTOTAL", "CKMB", "CKMBINDEX", "TROPONINI" in the last 168 hours. BNP (last 3 results) No results for input(s): "PROBNP" in the last 8760 hours. HbA1C: No results for input(s): "HGBA1C" in the last 72 hours. CBG: No results for input(s): "GLUCAP" in the last 168 hours. Lipid Profile: No results for input(s): "CHOL", "HDL", "LDLCALC", "TRIG", "CHOLHDL", "LDLDIRECT" in the last 72 hours. Thyroid Function Tests: No results for input(s): "TSH", "T4TOTAL", "FREET4", "T3FREE", "THYROIDAB" in the last 72 hours. Anemia Panel: No results for input(s): "VITAMINB12", "FOLATE", "FERRITIN", "TIBC", "IRON", "RETICCTPCT" in the last 72 hours. Sepsis Labs: No results for input(s): "PROCALCITON", "LATICACIDVEN" in the last 168 hours.  No results found for this or any previous visit (from the past 240 hour(s)).       Radiology Studies: CT PELVIS WO CONTRAST  Result Date: 07/25/2021 CLINICAL DATA:  Hip trauma, fracture suspected, xray done. Fall, left hip injury EXAM: CT PELVIS WITHOUT CONTRAST TECHNIQUE: Multidetector CT imaging of the pelvis was performed following the standard protocol without intravenous contrast. RADIATION DOSE REDUCTION: This exam was performed according to the departmental dose-optimization program which includes automated exposure control, adjustment of the mA and/or kV according to patient size and/or use of iterative reconstruction technique. COMPARISON:  None Available. FINDINGS: Urinary Tract: The visualized inferior pole right kidney is unremarkable. No hydroureter. The bladder is unremarkable. Bowel: Severe sigmoid diverticulosis without superimposed acute  inflammatory change. The visualized large and small bowel are otherwise unremarkable. Appendix normal. Vascular/Lymphatic: Mild aortoiliac atherosclerotic calcification. No aortic aneurysm. No pathologic adenopathy within the pelvis. Reproductive:  Uterus unremarkable.  No adnexal masses. Other:  None Musculoskeletal: There are insufficiency fractures of the sacral ala bilaterally. There is an acute impacted fracture of the left superior pubic ramus. Subtle angular deformity of the inferior pubic ramus (axial image # 110, series 2) suggests a plastic fracture of the structure with fracture fragments in near anatomic alignment. The osseous structures are diffusely osteopenic. Degenerative changes are seen within the lumbar spine and hips bilaterally. No dislocation. IMPRESSION: 1. Acute impacted fracture of the left superior pubic ramus. 2. Acute plastic fracture of the left inferior pubic ramus with fracture fragments in near anatomic alignment. 3. Bilateral sacral alar insufficiency fractures. 4. Diffuse osteopenia. 5. Severe sigmoid diverticulosis without superimposed acute inflammatory change. Electronically Signed   By: Helyn Numbers M.D.   On: 07/25/2021 20:22   CT Cervical Spine Wo Contrast  Result Date: 07/25/2021 CLINICAL DATA:  Trauma. EXAM: CT HEAD WITHOUT CONTRAST CT CERVICAL SPINE WITHOUT CONTRAST TECHNIQUE: Multidetector CT imaging of the head and cervical spine was performed following the standard protocol without intravenous contrast. Multiplanar CT image  reconstructions of the cervical spine were also generated. RADIATION DOSE REDUCTION: This exam was performed according to the departmental dose-optimization program which includes automated exposure control, adjustment of the mA and/or kV according to patient size and/or use of iterative reconstruction technique. COMPARISON:  None Available. FINDINGS: CT HEAD FINDINGS Brain: There is mild age-related atrophy and moderate chronic microvascular  ischemic changes. Old left basal ganglia lacunar infarct. There is no acute intracranial hemorrhage. No mass effect or midline shift. No extra-axial fluid collection. Vascular: No hyperdense vessel or unexpected calcification. Skull: Normal. Negative for fracture or focal lesion. Sinuses/Orbits: The visualized paranasal sinuses and mastoid air cells are clear. Right globe scleral banding. Other: None CT CERVICAL SPINE FINDINGS Alignment: No acute subluxation. Skull base and vertebrae: No acute fracture. Soft tissues and spinal canal: No prevertebral fluid or swelling. No visible canal hematoma. Disc levels:  No acute findings.  Multilevel degenerative changes. Upper chest: Biapical subpleural scarring. Other: None IMPRESSION: 1. No acute intracranial pathology. 2. Age-related atrophy and chronic microvascular ischemic changes. Old left basal ganglia lacunar infarct. 3. No acute/traumatic cervical spine pathology. Electronically Signed   By: Elgie Collard M.D.   On: 07/25/2021 20:17   CT Head Wo Contrast  Result Date: 07/25/2021 CLINICAL DATA:  Trauma. EXAM: CT HEAD WITHOUT CONTRAST CT CERVICAL SPINE WITHOUT CONTRAST TECHNIQUE: Multidetector CT imaging of the head and cervical spine was performed following the standard protocol without intravenous contrast. Multiplanar CT image reconstructions of the cervical spine were also generated. RADIATION DOSE REDUCTION: This exam was performed according to the departmental dose-optimization program which includes automated exposure control, adjustment of the mA and/or kV according to patient size and/or use of iterative reconstruction technique. COMPARISON:  None Available. FINDINGS: CT HEAD FINDINGS Brain: There is mild age-related atrophy and moderate chronic microvascular ischemic changes. Old left basal ganglia lacunar infarct. There is no acute intracranial hemorrhage. No mass effect or midline shift. No extra-axial fluid collection. Vascular: No hyperdense vessel  or unexpected calcification. Skull: Normal. Negative for fracture or focal lesion. Sinuses/Orbits: The visualized paranasal sinuses and mastoid air cells are clear. Right globe scleral banding. Other: None CT CERVICAL SPINE FINDINGS Alignment: No acute subluxation. Skull base and vertebrae: No acute fracture. Soft tissues and spinal canal: No prevertebral fluid or swelling. No visible canal hematoma. Disc levels:  No acute findings.  Multilevel degenerative changes. Upper chest: Biapical subpleural scarring. Other: None IMPRESSION: 1. No acute intracranial pathology. 2. Age-related atrophy and chronic microvascular ischemic changes. Old left basal ganglia lacunar infarct. 3. No acute/traumatic cervical spine pathology. Electronically Signed   By: Elgie Collard M.D.   On: 07/25/2021 20:17   DG Hip Unilat W or Wo Pelvis 2-3 Views Left  Result Date: 07/25/2021 CLINICAL DATA:  Pain after a fall EXAM: DG HIP (WITH OR WITHOUT PELVIS) 2-3V LEFT COMPARISON:  None Available. FINDINGS: AP view of the pelvis and AP/frog leg views of the left hip. Femoral heads are located. Sacroiliac joints are symmetric. Osteopenia. Degenerate disc disease at L4-5. Subtle osseous irregularity in the left parasymphyseal pubic bone IMPRESSION: Suspect a left parasymphyseal pubic bone fracture, minimally displaced. Correlate with point tenderness. CT could confirm. Electronically Signed   By: Jeronimo Greaves M.D.   On: 07/25/2021 17:51        Scheduled Meds:  vitamin C  500 mg Oral Daily   cholecalciferol  1,000 Units Oral Daily   enoxaparin (LOVENOX) injection  40 mg Subcutaneous Q24H   famotidine  20 mg Oral BID  latanoprost  1 drop Left Eye QHS   lisinopril  10 mg Oral Daily   loratadine  10 mg Oral Daily   metoprolol succinate  200 mg Oral Daily   montelukast  10 mg Oral QPM   multivitamin with minerals  1 tablet Oral Daily   omega-3 acid ethyl esters  1 g Oral Daily   predniSONE  2.5 mg Oral Q breakfast   timolol  1  drop Both Eyes BID   Continuous Infusions:  diltiazem (CARDIZEM) infusion 12.5 mg/hr (07/26/21 0721)     LOS: 1 day    Time spent: 39 min  Alwyn Ren, MD 07/26/2021, 9:47 AM

## 2021-07-26 NOTE — Progress Notes (Signed)
PHARMACIST - PHYSICIAN COMMUNICATION  CONCERNING: P&T Medication Policy Regarding Oral Bisphosphonates  RECOMMENDATION: Your order for alendronate (Fosamax), ibandronate (Boniva), or risedronate (Actonel) has been discontinued at this time.  If the patient's post-hospital medical condition warrants safe use of this class of drugs, please resume the pre-hospital regimen upon discharge.  DESCRIPTION:  Alendronate (Fosamax), ibandronate (Boniva), and risedronate (Actonel) can cause severe esophageal erosions in patients who are unable to remain upright at least 30 minutes after taking this medication.   Since brief interruptions in therapy are thought to have minimal impact on bone mineral density, the Pharmacy & Therapeutics Committee has established that bisphosphonate orders should be routinely discontinued during hospitalization.   To override this safety policy and permit administration of Boniva, Fosamax, or Actonel in the hospital, prescribers must write "DO NOT HOLD" in the comments section when placing the order for this class of medications.   Junita Push, PharmD, BCPS 07/26/2021@12 :17 AM

## 2021-07-26 NOTE — ED Notes (Signed)
Pt HR trending into 50s. Messaged provider. Awaiting reply at this time.

## 2021-07-26 NOTE — ED Notes (Signed)
Per cardiology, titrate cardizem as needed to maintain goal HR.

## 2021-07-27 DIAGNOSIS — I4891 Unspecified atrial fibrillation: Secondary | ICD-10-CM | POA: Diagnosis not present

## 2021-07-27 LAB — BASIC METABOLIC PANEL
Anion gap: 8 (ref 5–15)
BUN: 21 mg/dL (ref 8–23)
CO2: 25 mmol/L (ref 22–32)
Calcium: 9.2 mg/dL (ref 8.9–10.3)
Chloride: 103 mmol/L (ref 98–111)
Creatinine, Ser: 0.66 mg/dL (ref 0.44–1.00)
GFR, Estimated: >60 mL/min (ref >60)
Glucose, Bld: 124 mg/dL — ABNORMAL HIGH (ref 70–99)
Potassium: 3.9 mmol/L (ref 3.5–5.1)
Sodium: 136 mmol/L (ref 135–145)

## 2021-07-27 LAB — T3, FREE: T3, Free: 5.8 pg/mL — ABNORMAL HIGH (ref 2.0–4.4)

## 2021-07-27 LAB — MAGNESIUM: Magnesium: 2.1 mg/dL (ref 1.7–2.4)

## 2021-07-27 MED ORDER — LISINOPRIL 5 MG PO TABS: 2.5000 mg | ORAL_TABLET | Freq: Every day | ORAL | Status: DC

## 2021-07-27 MED ORDER — DILTIAZEM HCL 60 MG PO TABS
60.0000 mg | ORAL_TABLET | Freq: Three times a day (TID) | ORAL | Status: DC
  Administered 2021-07-27 – 2021-07-28 (×3): 60 mg via ORAL
  Filled 2021-07-27 (×3): qty 1

## 2021-07-27 MED ORDER — APIXABAN 2.5 MG PO TABS
2.5000 mg | ORAL_TABLET | Freq: Two times a day (BID) | ORAL | Status: DC
Start: 2021-07-27 — End: 2021-07-27

## 2021-07-27 MED ORDER — APIXABAN 2.5 MG PO TABS
2.5000 mg | ORAL_TABLET | Freq: Two times a day (BID) | ORAL | Status: DC
  Administered 2021-07-27 – 2021-07-31 (×8): 2.5 mg via ORAL
  Filled 2021-07-27 (×8): qty 1

## 2021-07-27 NOTE — TOC Initial Note (Signed)
Transition of Care [facility]) - Initial/Assessment Note    Patient Details  Name: Emily Robertson MRN: [id number] Date of Birth: Jan 20, 1931  Transition of Care [facility]) CM/SW Contact:    Golda Acre, RN Phone Number: 07/27/2021, 8:40 AM  Clinical Narrative:                  Transition of Care [facility]) Screening Note   Patient Details  Name: Emily Robertson Date of Birth: 02/02/1931   Transition of Care [facility]) CM/SW Contact:    Golda Acre, RN Phone Number: 07/27/2021, 8:40 AM    Transition of Care Department [facility]) has reviewed patient and no TOC needs have been identified at this time. We will continue to monitor patient advancement through interdisciplinary progression rounds. If new patient transition needs arise, please place a TOC consult.    Expected Discharge Plan: Home/Self Care Barriers to Discharge: Continued Medical Work up   Patient Goals and CMS Choice Patient states their goals for this hospitalization and ongoing recovery are:: to go back home CMS Medicare.gov Compare Post Acute Care list provided to:: Patient Choice offered to / list presented to : Patient  Expected Discharge Plan and Services Expected Discharge Plan: Home/Self Care   Discharge Planning Services: CM Consult   Living arrangements for the past 2 months: Single Family Home                                      Prior Living Arrangements/Services Living arrangements for the past 2 months: Single Family Home Lives with:: Self Patient language and need for interpreter reviewed:: Yes Do you feel safe going back to the place where you live?: Yes            Criminal Activity/Legal Involvement Pertinent to Current Situation/Hospitalization: No - Comment as needed  Activities of Daily Living Home Assistive Devices/Equipment: Cane (specify quad or straight), Eyeglasses, Walker (specify type) ADL Screening (condition at time of admission) Patient's cognitive ability adequate  to safely complete daily activities?: Yes Is the patient deaf or have difficulty hearing?: Yes Does the patient have difficulty seeing, even when wearing glasses/contacts?: No Does the patient have difficulty concentrating, remembering, or making decisions?: No Patient able to express need for assistance with ADLs?: Yes Does the patient have difficulty dressing or bathing?: No Independently performs ADLs?: No Communication: Independent Dressing (OT): Independent Grooming: Independent Feeding: Independent Bathing: Needs assistance Is this a change from baseline?: Change from baseline, expected to last <3 days Toileting: Needs assistance Is this a change from baseline?: Change from baseline, expected to last <3 days In/Out Bed: Needs assistance Is this a change from baseline?: Change from baseline, expected to last <3 days Walks in Home: Needs assistance Is this a change from baseline?: Change from baseline, expected to last <3 days Does the patient have difficulty walking or climbing stairs?: No Weakness of Legs: Both Weakness of Arms/Hands: Both  Permission Sought/Granted                  Emotional Assessment Appearance:: Appears stated age     Orientation: : Oriented to Place, Oriented to  Time, Oriented to Self, Oriented to Situation Alcohol / Substance Use: Not Applicable Psych Involvement: No (comment)  Admission diagnosis:  New onset atrial fibrillation (HCC) [I48.91] New onset a-fib (HCC) [I48.91] Facial laceration, initial encounter [S01.81XA] Multiple closed fractures of pelvis with stable disruption of pelvic ring,  initial encounter [facility]) [S32.810A] Patient Active Problem List   Diagnosis Date Noted   Osteoporosis 07/25/2021   Rheumatoid arthritis (HCC) 07/25/2021   Pure hypercholesterolemia 07/25/2021   New onset a-fib (HCC) 07/25/2021   Fall at home, initial encounter 07/25/2021   Closed pelvic fracture (HCC) 07/25/2021   Hyperthyroidism 08/03/2013    Angioedema 11/29/2012   HTN (hypertension) 11/29/2012   PCP:  Rogers Blocker, FNP Pharmacy:   [facility] [address], [place] - [address] [place] [address] Phone: [phone] Fax: [phone]     Social Determinants of Health (SDOH) Interventions    Readmission Risk Interventions     No data to display

## 2021-07-27 NOTE — TOC Progression Note (Addendum)
Transition of Care [facility]) - Progression Note    Patient Details  Name: Emily Robertson MRN: [id number] Date of Birth: April 24, 1930  [facility]) CM/SW Contact  Golda Acre, RN Phone Number: 07/27/2021, 12:39 PM  Clinical Narrative:    Clovis Cao sent out to area [facility] for review.  Pt does not have a preference but needs choices. [facility] will [facility] per the patient wishes ad start auth.  Expected Discharge Plan: Home/Self Care Barriers to Discharge: Continued Medical Work up  Expected Discharge Plan and Services Expected Discharge Plan: Home/Self Care   Discharge Planning Services: CM Consult   Living arrangements for the past 2 months: Single Family Home                                       Social Determinants of Health (SDOH) Interventions    Readmission Risk Interventions     No data to display

## 2021-07-27 NOTE — Discharge Instructions (Signed)

## 2021-07-27 NOTE — Evaluation (Signed)
Physical Therapy Evaluation Patient Details Name: Emily Robertson MRN: [id number] DOB: Aug 10, 1930 Today's Date: 07/27/2021  History of Present Illness  88 yo female  lives alone , admitted 07/25/21 after fall in her home. sustained laceration Left forhead,Found to be in A-fib RVR in the ER which is new. Sustained closed pelvic fracture, bilateral sacral insufficiency fracture and a nondisplaced left superior ramus fracture.  Her history significant for hypertension hyperlipidemia glaucoma and arthritis. Orthopedic consult allowing WBAT.  Clinical Impression   The patient is pleasant and eager to ambulate. Patient  independently lives alone, still drives. Patient will benefit from [facility] for rehab.  HR 96-115 during ambulation. Pt admitted with above diagnosis.  Pt currently with functional limitations due to the deficits listed below (see PT Problem List). Pt will benefit from skilled PT to increase their independence and safety with mobility to allow discharge to the venue listed below.          Recommendations for follow up therapy are one component of a multi-disciplinary discharge planning process, led by the attending physician.  Recommendations may be updated based on patient status, additional functional criteria and insurance authorization.  Follow Up Recommendations Skilled nursing-short term rehab (<3 hours/day)    Assistance Recommended at Discharge Frequent or constant Supervision/Assistance  Patient can return home with the following  A little help with walking and/or transfers;A little help with bathing/dressing/bathroom;Help with stairs or ramp for entrance;Assistance with cooking/housework;Assist for transportation    Equipment Recommendations None recommended by PT  Recommendations for Other Services       Functional Status Assessment Patient has had a recent decline in their functional status and demonstrates the ability to make significant improvements in function in a  reasonable and predictable amount of time.     Precautions / Restrictions Precautions Precautions: Fall Precaution Comments: in afib,      Mobility  Bed Mobility                    Transfers Overall transfer level: Needs assistance Equipment used: Rolling walker (2 wheels) Transfers: Sit to/from Stand Sit to Stand: Min assist           General transfer comment: extra  effort from  bed    Ambulation/Gait Ambulation/Gait assistance: Min assist Gait Distance (Feet): 20 Feet Assistive device: Rolling walker (2 wheels) Gait Pattern/deviations: Step-to pattern, Antalgic Gait velocity: decr     General Gait Details: antalfic on the LLE  Stairs            Wheelchair Mobility    Modified Rankin (Stroke Patients Only)       Balance Overall balance assessment: History of Falls, Needs assistance Sitting-balance support: Feet supported, No upper extremity supported Sitting balance-Leahy Scale: Good     Standing balance support: Bilateral upper extremity supported, During functional activity Standing balance-Leahy Scale: Fair                               Pertinent Vitals/Pain Pain Assessment Pain Assessment: Faces Faces Pain Scale: Hurts little more Pain Location: left  hip/pelvis with WB Pain Descriptors / Indicators: Discomfort Pain Intervention(s): Limited activity within patient's tolerance, RN gave pain meds during session    Home Living Family/patient expects to be discharged to:: Private residence Living Arrangements: Alone Available Help at Discharge: Family;Available PRN/intermittently Type of Home: House Home Access: Stairs to enter Entrance Stairs-Rails: Right;Left Entrance Stairs-Number of Steps: 5   Home Layout:  Two level;Able to live on main level with bedroom/bathroom Home Equipment: Hand held shower head;Shower Counsellor (2 wheels);Grab bars - tub/shower      Prior Function Prior Level of Function :  Independent/Modified Independent             Mobility Comments: uses cane/walker outside PRN ADLs Comments: independent, doesn't use the shower chair currently     Hand Dominance   Dominant Hand: Right    Extremity/Trunk Assessment        Lower Extremity Assessment Lower Extremity Assessment: RLE deficits/detail;LLE deficits/detail RLE Sensation: WNL LLE Deficits / Details: antalgic  with WB    Cervical / Trunk Assessment Cervical / Trunk Assessment: Kyphotic  Communication   Communication: No difficulties  Cognition Arousal/Alertness: Awake/alert Behavior During Therapy: WFL for tasks assessed/performed Overall Cognitive Status: Within Functional Limits for tasks assessed                                          General Comments      Exercises     Assessment/Plan    PT Assessment Patient needs continued PT services  PT Problem List Decreased strength;Decreased knowledge of precautions;Decreased range of motion;Decreased mobility;Decreased knowledge of use of DME;Decreased activity tolerance;Pain       PT Treatment Interventions DME instruction;Functional mobility training;Patient/family education;Gait training;Therapeutic activities;Therapeutic exercise    PT Goals (Current goals can be found in the Care Plan section)  Acute Rehab PT Goals Patient Stated Goal: to go ro rehab PT Goal Formulation: With patient/family Time For Goal Achievement: 08/10/21 Potential to Achieve Goals: Good    Frequency Min 2X/week     Co-evaluation PT/OT/SLP Co-Evaluation/Treatment: Yes Reason for Co-Treatment: To address functional/ADL transfers PT goals addressed during session: Mobility/safety with mobility OT goals addressed during session: ADL's and self-care       AM-PAC PT "6 Clicks" Mobility  Outcome Measure Help needed turning from your back to your side while in a flat bed without using bedrails?: A Little Help needed moving from lying on  your back to sitting on the side of a flat bed without using bedrails?: A Little Help needed moving to and from a bed to a chair (including a wheelchair)?: A Little Help needed standing up from a chair using your arms (e.g., wheelchair or bedside chair)?: A Little Help needed to walk in hospital room?: A Little Help needed climbing 3-5 steps with a railing? : A Lot 6 Click Score: 17    End of Session Equipment Utilized During Treatment: Gait belt Activity Tolerance: Patient limited by pain;Patient tolerated treatment well Patient left: in chair;with call bell/phone within reach;with family/visitor present;with chair alarm set Nurse Communication: Mobility status PT Visit Diagnosis: Pain;History of falling (Z91.81) Pain - Right/Left: Left Pain - part of body: Hip    Time: 0354-6568 PT Time Calculation (min) (ACUTE ONLY): 23 min   Charges:   PT Evaluation $PT Eval Low Complexity: 1 Low          Blanchard Kelch PT Acute Rehabilitation Services Pager [phone] Office [phone]   Rada Hay 07/27/2021, 12:53 PM

## 2021-07-27 NOTE — Evaluation (Signed)
Occupational Therapy Evaluation Patient Details Name: Emily Robertson MRN: [id number] DOB: [birth year] Today's Date: 07/27/2021   History of Present Illness 88 yo female  lives alone , admitted 07/25/21 after fall in her home. sustained laceration Left forhead,Found to be in A-fib RVR in the ER which is new. Sustained closed pelvic fracture, bilateral sacral insufficiency fracture and a nondisplaced left superior ramus fracture.  Her history significant for hypertension hyperlipidemia glaucoma and arthritis. Orthopedic consult allowing WBAT.   Clinical Impression   Emily Robertson is an 88 year old woman admitted to hospital with above medical history and presents with decreased strength and Rom of LLE, decreased activity tolerance, impaired balance and pain. Patient needing min assist to ambulate with walker and increased assistance with ADLs including  mod assist for LB ADLs and min assist for toileting. Patient will benefit from skilled OT services while in hospital to improve deficits and learn compensatory strategies as needed in order to return to PLOF.         Recommendations for follow up therapy are one component of a multi-disciplinary discharge planning process, led by the attending physician.  Recommendations may be updated based on patient status, additional functional criteria and insurance authorization.   Follow Up Recommendations  Skilled nursing-short term rehab (<3 hours/day)    Assistance Recommended at Discharge Frequent or constant Supervision/Assistance  Patient can return home with the following A little help with walking and/or transfers;A little help with bathing/dressing/bathroom;Assistance with cooking/housework;Help with stairs or ramp for entrance    Functional Status Assessment  Patient has had a recent decline in their functional status and demonstrates the ability to make significant improvements in function in a reasonable and predictable amount of time.   Equipment Recommendations  None recommended by OT    Recommendations for Other Services       Precautions / Restrictions Precautions Precautions: Fall Precaution Comments: in afib, Restrictions Weight Bearing Restrictions: No      Mobility Bed Mobility Overal bed mobility: Needs Assistance Bed Mobility: Supine to Sit     Supine to sit: Supervision     General bed mobility comments: increased time    Transfers Overall transfer level: Needs assistance Equipment used: Rolling walker (2 wheels) Transfers: Sit to/from Stand Sit to Stand: Min assist           General transfer comment: able to ambulate in room with RW.      Balance Overall balance assessment: History of Falls                                         ADL either performed or assessed with clinical judgement   ADL Overall ADL's : Needs assistance/impaired Eating/Feeding: Independent   Grooming: Set up;Sitting   Upper Body Bathing: Set up;Sitting   Lower Body Bathing: Moderate assistance;Sit to/from stand   Upper Body Dressing : Set up;Sitting   Lower Body Dressing: Moderate assistance;Sit to/from stand   Toilet Transfer: Minimal assistance;Ambulation;Grab bars;Regular Toilet   Toileting- Clothing Manipulation and Hygiene: Minimal assistance;Sit to/from stand       Functional mobility during ADLs: Minimal assistance;Rolling walker (2 wheels)       Vision Patient Visual Report: No change from baseline       Perception     Praxis      Pertinent Vitals/Pain Pain Assessment Pain Assessment: Faces Faces Pain Scale: Hurts a little bit Pain  Location: left  hip/pelvis with WB Pain Descriptors / Indicators: Discomfort Pain Intervention(s): Monitored during session     Hand Dominance Right   Extremity/Trunk Assessment Upper Extremity Assessment Upper Extremity Assessment: Overall WFL for tasks assessed   Lower Extremity Assessment Lower Extremity Assessment:  Defer to PT evaluation RLE Sensation: WNL LLE Deficits / Details: antalgic  with WB   Cervical / Trunk Assessment Cervical / Trunk Assessment: Kyphotic   Communication Communication Communication: No difficulties   Cognition Arousal/Alertness: Awake/alert Behavior During Therapy: WFL for tasks assessed/performed Overall Cognitive Status: Within Functional Limits for tasks assessed                                       General Comments       Exercises     Shoulder Instructions      Home Living Family/patient expects to be discharged to:: Private residence Living Arrangements: Alone Available Help at Discharge: Family;Available PRN/intermittently Type of Home: House Home Access: Stairs to enter [employer] of Steps: 5 Entrance Stairs-Rails: Right;Left Home Layout: Two level;Able to live on main level with bedroom/bathroom     Bathroom Shower/Tub: Tub/shower unit         Home Equipment: Hand held shower head;Shower Counsellor (2 wheels);Grab bars - tub/shower          Prior Functioning/Environment Prior Level of Function : Independent/Modified Independent             Mobility Comments: uses cane/walker outside PRN ADLs Comments: independent, doesn't use the shower chair currently        OT Problem List: Decreased strength;Decreased range of motion;Decreased activity tolerance;Impaired balance (sitting and/or standing);Pain;Decreased knowledge of use of DME or AE      OT Treatment/Interventions: Self-care/ADL training;DME and/or AE instruction;Therapeutic activities;Balance training    OT Goals(Current goals can be found in the care plan section) Acute Rehab OT Goals Patient Stated Goal: return to independence Time For Goal Achievement: 08/10/21 Potential to Achieve Goals: Good  OT Frequency: Min 2X/week    Co-evaluation PT/OT/SLP Co-Evaluation/Treatment: Yes (co eval) Reason for Co-Treatment: To address  functional/ADL transfers PT goals addressed during session: Mobility/safety with mobility OT goals addressed during session: ADL's and self-care      AM-PAC OT "6 Clicks" Daily Activity     Outcome Measure Help from another person eating meals?: None Help from another person taking care of personal grooming?: A Little Help from another person toileting, which includes using toliet, bedpan, or urinal?: A Little Help from another person bathing (including washing, rinsing, drying)?: A Lot Help from another person to put on and taking off regular upper body clothing?: A Little Help from another person to put on and taking off regular lower body clothing?: A Lot 6 Click Score: 17   End of Session Equipment Utilized During Treatment: Rolling walker (2 wheels);Gait belt Nurse Communication: Mobility status  Activity Tolerance: Patient tolerated treatment well Patient left: in chair;with call bell/phone within reach;with chair alarm set  OT Visit Diagnosis: History of falling (Z91.81)                Time: 0630-1601 OT Time Calculation (min): 17 min Charges:  OT General Charges $OT Visit: 1 Visit OT Evaluation $OT Eval Low Complexity: 1 Low  Kerianne Gurr, OTR/L Acute Care Rehab Services  Office [phone] Pager: [phone]   Kelli Churn 07/27/2021, 2:01 PM

## 2021-07-27 NOTE — Progress Notes (Signed)
PROGRESS NOTE    Emily Robertson  [id number] DOB: 1930-02-14 DOA: 07/25/2021 PCP: [name], FNP   Brief Narrative:88 yo female  lives alone was walking from kitchen to the den and heat left upper forehead and has sutures on the forehead.   She complains of some shortness of breath denies any chest pain.  She was also found to be in A-fib RVR in the ER which is new for her. She has closed pelvic fracture bilateral sacral insufficiency fracture and a nondisplaced left superior ramus fracture. Her history significant for hypertension hyperlipidemia glaucoma and arthritis.  Assessment & Plan:   Principal Problem:   New onset a-fib [facility]) Active Problems:   HTN (hypertension)   Hyperthyroidism   Osteoporosis   Rheumatoid arthritis ([place])   Pure hypercholesterolemia   Fall at home, initial encounter   Closed pelvic fracture ([place])  #1 pelvic fracture status post fall bilateral sacral insufficiency fractures and left-sided pubic Ramey fracture.  Seen by Ortho. PT OT consult. Weightbearing as tolerated. Follow-up with Ortho in 2 to 3 weeks for recheck and repeat x-rays.  #2 new onset A-fib seen by cardiology appreciate their input.  On Cardizem and metoprolol.  She was on Cardizem drip until this morning.  Will DC lisinopril per cardiology notes since her BP is soft and increasing dose of Cardizem being added. Echo ejection fraction 60 to 65%.  Mildly elevated pulmonary artery systolic pressure.  Mild to moderate tricuspid valve regurgitation.  No aortic stenosis. TSH 0.010 Free T4 and T3 elevated K 3.9 mag 2.1  Will discuss with her endocrinologist left message for Dr. Dwyane Dee. Patient was started on Eliquis today after discussion with patient and his son.  #3 essential hypertension continue Cardizem and Toprol  #4 history of rheumatoid arthritis and osteoporosis on prednisone 2.5 mg daily  #6 hyperlipidemia on Lovaza  #7 glaucoma continue Xalatan eyedrops  #8 seasonal  allergies on Claritin and Singulair  #9 hypokalemia potassium 3.9 magnesium two-point  Estimated body mass index is 22.31 kg/m as calculated from the following:   Height as of this encounter: 5\' 4"  (1.626 m).   Weight as of this encounter: 59 kg.  DVT prophylaxis: Lovenox Code Status: full Family Communication: none Disposition Plan:  Status is: Inpatient Remains inpatient appropriate because: A-fib RVR   Consultants:  Cardiology and Ortho  Procedures: None Antimicrobials: None  Subjective: She is resting in bed she reports she has slept better last night has not walked yet she is kind of anxious to walk due to pain On standing dose of Tylenol Objective: Vitals:   07/26/21 1542 07/26/21 2004 07/26/21 2358 07/27/21 0415  BP: (!) 113/56 125/68 119/79 136/77  Pulse: 65 72 75 83  Resp: 18 20 17  (!) 23  Temp: 98.2 F (36.8 C) 98.2 F (36.8 C) 98 F (36.7 C) 97.7 F (36.5 C)  TempSrc: Oral     SpO2: 93% 96% 96% 97%  Weight:      Height:        Intake/Output Summary (Last 24 hours) at 07/27/2021 1213 Last data filed at 07/27/2021 0900 Gross per 24 hour  Intake 613.39 ml  Output 500 ml  Net 113.39 ml   Filed Weights   07/26/21 0929  Weight: 59 kg    Examination:  General exam: Appears in mild distress due to pain  respiratory system: Diminished breath sounds at the bases to auscultation. Respiratory effort normal. Cardiovascular system: S1 & S2 heard,irreg No JVD, murmurs, rubs, gallops or  clicks. No pedal edema. Gastrointestinal system: Abdomen is nondistended, soft and nontender. No organomegaly or masses felt. Normal bowel sounds heard. Central nervous system: Alert and oriented. No focal neurological deficits. Extremities: Symmetric 5 x 5 power. Skin: No rashes, lesions or ulcers Psychiatry: Judgement and insight appear normal. Mood & affect appropriate.     Data Reviewed: I have personally reviewed following labs and imaging studies  CBC: Recent Labs   Lab 07/25/21 2040 07/26/21 0923  WBC 12.5* 10.2  NEUTROABS 10.4*  --   HGB 14.2 12.9  HCT 42.3 37.0  MCV 87.6 86.7  PLT 285 [id number]    Basic Metabolic Panel: Recent Labs  Lab 07/25/21 2040 07/26/21 0923 07/27/21 0827  NA 134* 135 136  K 3.6 3.3* 3.9  CL 100 103 103  CO2 23 23 25   GLUCOSE 145* 143* 124*  BUN 16 18 21   CREATININE 0.65 0.51 0.66  CALCIUM 9.4 9.3 9.2  MG  --  1.8 2.1    GFR: Estimated Creatinine Clearance: 41.2 mL/min (by C-G formula based on SCr of 0.66 mg/dL). Liver Function Tests: Recent Labs  Lab 07/25/21 2040  AST 33  ALT 23  ALKPHOS 95  BILITOT 1.0  PROT 7.2  ALBUMIN 4.0    No results for input(s): "LIPASE", "AMYLASE" in the last 168 hours. No results for input(s): "AMMONIA" in the last 168 hours. Coagulation Profile: No results for input(s): "INR", "PROTIME" in the last 168 hours. Cardiac Enzymes: No results for input(s): "CKTOTAL", "CKMB", "CKMBINDEX", "TROPONINI" in the last 168 hours. BNP (last 3 results) No results for input(s): "PROBNP" in the last 8760 hours. HbA1C: No results for input(s): "HGBA1C" in the last 72 hours. CBG: No results for input(s): "GLUCAP" in the last 168 hours. Lipid Profile: Recent Labs    07/26/21 0923  CHOL 144  HDL 52  LDLCALC 82  TRIG 52  CHOLHDL 2.8   Thyroid Function Tests: Recent Labs    07/26/21 0923  TSH 0.010*  FREET4 3.62*  T3FREE 5.8*   Anemia Panel: No results for input(s): "VITAMINB12", "FOLATE", "FERRITIN", "TIBC", "IRON", "RETICCTPCT" in the last 72 hours. Sepsis Labs: No results for input(s): "PROCALCITON", "LATICACIDVEN" in the last 168 hours.  No results found for this or any previous visit (from the past 240 hour(s)).       Radiology Studies: ECHOCARDIOGRAM COMPLETE  Result Date: 07/26/2021    ECHOCARDIOGRAM REPORT   Patient Name:   DALLIE ISACKSON Date of Exam: 07/26/2021 Medical Rec #:  [id number]         Height:       62.0 in Accession #:    [id number]         Weight:       123.4 lb Date of Birth:  1930-06-01         BSA:          1.557 m Patient Age:    88 years          BP:           136/83 mmHg Patient Gender: F                 HR:           81 bpm. Exam Location:  Inpatient Procedure: 2D Echo, Cardiac Doppler and Color Doppler Indications:    Atrial fibrillation  History:        Patient has no prior history of Echocardiogram examinations.  Arrythmias:Atrial Fibrillation; Risk Factors:Hypertension.  Sonographer:    Jefferey Pica Referring Phys: [id number] [place]  1. Left ventricular ejection fraction, by estimation, is 60 to 65%. The left ventricle has normal function. The left ventricle has no regional wall motion abnormalities. Left ventricular diastolic function could not be evaluated.  2. Right ventricular systolic function is normal. The right ventricular size is normal. There is mildly elevated pulmonary artery systolic pressure.  3. Left atrial size was severely dilated.  4. Right atrial size was moderately dilated.  5. The mitral valve is normal in structure. Moderate to severe mitral valve regurgitation. No evidence of mitral stenosis.  6. Tricuspid valve regurgitation is mild to moderate.  7. The aortic valve is tricuspid. Aortic valve regurgitation is mild. No aortic stenosis is present.  8. The inferior vena cava is normal in size with greater than 50% respiratory variability, suggesting right atrial pressure of 3 mmHg. FINDINGS  Left Ventricle: Left ventricular ejection fraction, by estimation, is 60 to 65%. The left ventricle has normal function. The left ventricle has no regional wall motion abnormalities. The left ventricular internal cavity size was normal in size. There is  no left ventricular hypertrophy. Left ventricular diastolic function could not be evaluated due to atrial fibrillation. Left ventricular diastolic function could not be evaluated. Right Ventricle: The right ventricular size is normal. No  increase in right ventricular wall thickness. Right ventricular systolic function is normal. There is mildly elevated pulmonary artery systolic pressure. The tricuspid regurgitant velocity is 2.91  m/s, and with an assumed right atrial pressure of 5 mmHg, the estimated right ventricular systolic pressure is [id number]. Left Atrium: Left atrial size was severely dilated. Right Atrium: Right atrial size was moderately dilated. Pericardium: There is no evidence of pericardial effusion. Mitral Valve: The mitral valve is normal in structure. Mild to moderate mitral annular calcification. Moderate to severe mitral valve regurgitation, with eccentric anteriorly directed jet. No evidence of mitral valve stenosis. Tricuspid Valve: The tricuspid valve is normal in structure. Tricuspid valve regurgitation is mild to moderate. No evidence of tricuspid stenosis. Aortic Valve: The aortic valve is tricuspid. Aortic valve regurgitation is mild. Aortic regurgitation PHT measures 556 msec. No aortic stenosis is present. Aortic valve peak gradient measures 5.0 mmHg. Pulmonic Valve: The pulmonic valve was normal in structure. Pulmonic valve regurgitation is not visualized. No evidence of pulmonic stenosis. Aorta: The aortic root is normal in size and structure. Venous: The inferior vena cava is normal in size with greater than 50% respiratory variability, suggesting right atrial pressure of 3 mmHg. IAS/Shunts: No atrial level shunt detected by color flow Doppler.  LEFT VENTRICLE PLAX 2D LVIDd:         4.50 cm LVIDs:         3.00 cm LV PW:         1.20 cm LV IVS:        1.10 cm LVOT diam:     2.00 cm LVOT Area:     3.14 cm  RIGHT VENTRICLE          IVC RV Basal diam:  3.10 cm  IVC diam: 1.30 cm LEFT ATRIUM              Index        RIGHT ATRIUM           Index LA diam:        3.30 cm  2.12 cm/m   RA Area:  19.50 cm LA Vol (A2C):   111.0 ml 71.29 ml/m  RA Volume:   55.40 ml  35.58 ml/m LA Vol (A4C):   98.5 ml  63.26 ml/m LA  Biplane Vol: 106.0 ml 68.08 ml/m  AORTIC VALVE              PULMONIC VALVE AV Vmax:      111.80 cm/s PV Vmax:       0.66 m/s AV Peak Grad: 5.0 mmHg    PV Peak grad:  1.7 mmHg AI PHT:       556 msec  AORTA Ao Root diam: 3.30 cm Ao Asc diam:  3.40 cm MITRAL VALVE                TRICUSPID VALVE MV Area (PHT): 4.23 cm     TR Peak grad:   33.9 mmHg MV Decel Time: 179 msec     TR Vmax:        291.00 cm/s MR Peak grad: 136.0 mmHg MR Vmax:      583.00 cm/s   SHUNTS MV E velocity: 114.33 cm/s  Systemic Diam: 2.00 cm Kardie Tobb DO Electronically signed by Thomasene Ripple DO Signature Date/Time: 07/26/2021/6:27:55 PM    Final    CT PELVIS WO CONTRAST  Result Date: 07/25/2021 CLINICAL DATA:  Hip trauma, fracture suspected, xray done. Fall, left hip injury EXAM: CT PELVIS WITHOUT CONTRAST TECHNIQUE: Multidetector CT imaging of the pelvis was performed following the standard protocol without intravenous contrast. RADIATION DOSE REDUCTION: This exam was performed according to the departmental dose-optimization program which includes automated exposure control, adjustment of the mA and/or kV according to patient size and/or use of iterative reconstruction technique. COMPARISON:  None Available. FINDINGS: Urinary Tract: The visualized inferior pole right kidney is unremarkable. No hydroureter. The bladder is unremarkable. Bowel: Severe sigmoid diverticulosis without superimposed acute inflammatory change. The visualized large and small bowel are otherwise unremarkable. Appendix normal. Vascular/Lymphatic: Mild aortoiliac atherosclerotic calcification. No aortic aneurysm. No pathologic adenopathy within the pelvis. Reproductive:  Uterus unremarkable.  No adnexal masses. Other:  None Musculoskeletal: There are insufficiency fractures of the sacral ala bilaterally. There is an acute impacted fracture of the left superior pubic ramus. Subtle angular deformity of the inferior pubic ramus (axial image # 110, series 2) suggests a plastic  fracture of the structure with fracture fragments in near anatomic alignment. The osseous structures are diffusely osteopenic. Degenerative changes are seen within the lumbar spine and hips bilaterally. No dislocation. IMPRESSION: 1. Acute impacted fracture of the left superior pubic ramus. 2. Acute plastic fracture of the left inferior pubic ramus with fracture fragments in near anatomic alignment. 3. Bilateral sacral alar insufficiency fractures. 4. Diffuse osteopenia. 5. Severe sigmoid diverticulosis without superimposed acute inflammatory change. Electronically Signed   By: Helyn Numbers M.D.   On: 07/25/2021 20:22   CT Cervical Spine Wo Contrast  Result Date: 07/25/2021 CLINICAL DATA:  Trauma. EXAM: CT HEAD WITHOUT CONTRAST CT CERVICAL SPINE WITHOUT CONTRAST TECHNIQUE: Multidetector CT imaging of the head and cervical spine was performed following the standard protocol without intravenous contrast. Multiplanar CT image reconstructions of the cervical spine were also generated. RADIATION DOSE REDUCTION: This exam was performed according to the departmental dose-optimization program which includes automated exposure control, adjustment of the mA and/or kV according to patient size and/or use of iterative reconstruction technique. COMPARISON:  None Available. FINDINGS: CT HEAD FINDINGS Brain: There is mild age-related atrophy and moderate chronic microvascular ischemic changes. Old left basal ganglia  lacunar infarct. There is no acute intracranial hemorrhage. No mass effect or midline shift. No extra-axial fluid collection. Vascular: No hyperdense vessel or unexpected calcification. Skull: Normal. Negative for fracture or focal lesion. Sinuses/Orbits: The visualized paranasal sinuses and mastoid air cells are clear. Right globe scleral banding. Other: None CT CERVICAL SPINE FINDINGS Alignment: No acute subluxation. Skull base and vertebrae: No acute fracture. Soft tissues and spinal canal: No prevertebral  fluid or swelling. No visible canal hematoma. Disc levels:  No acute findings.  Multilevel degenerative changes. Upper chest: Biapical subpleural scarring. Other: None IMPRESSION: 1. No acute intracranial pathology. 2. Age-related atrophy and chronic microvascular ischemic changes. Old left basal ganglia lacunar infarct. 3. No acute/traumatic cervical spine pathology. Electronically Signed   By: Elgie Collard M.D.   On: 07/25/2021 20:17   CT Head Wo Contrast  Result Date: 07/25/2021 CLINICAL DATA:  Trauma. EXAM: CT HEAD WITHOUT CONTRAST CT CERVICAL SPINE WITHOUT CONTRAST TECHNIQUE: Multidetector CT imaging of the head and cervical spine was performed following the standard protocol without intravenous contrast. Multiplanar CT image reconstructions of the cervical spine were also generated. RADIATION DOSE REDUCTION: This exam was performed according to the departmental dose-optimization program which includes automated exposure control, adjustment of the mA and/or kV according to patient size and/or use of iterative reconstruction technique. COMPARISON:  None Available. FINDINGS: CT HEAD FINDINGS Brain: There is mild age-related atrophy and moderate chronic microvascular ischemic changes. Old left basal ganglia lacunar infarct. There is no acute intracranial hemorrhage. No mass effect or midline shift. No extra-axial fluid collection. Vascular: No hyperdense vessel or unexpected calcification. Skull: Normal. Negative for fracture or focal lesion. Sinuses/Orbits: The visualized paranasal sinuses and mastoid air cells are clear. Right globe scleral banding. Other: None CT CERVICAL SPINE FINDINGS Alignment: No acute subluxation. Skull base and vertebrae: No acute fracture. Soft tissues and spinal canal: No prevertebral fluid or swelling. No visible canal hematoma. Disc levels:  No acute findings.  Multilevel degenerative changes. Upper chest: Biapical subpleural scarring. Other: None IMPRESSION: 1. No acute  intracranial pathology. 2. Age-related atrophy and chronic microvascular ischemic changes. Old left basal ganglia lacunar infarct. 3. No acute/traumatic cervical spine pathology. Electronically Signed   By: Elgie Collard M.D.   On: 07/25/2021 20:17   DG Hip Unilat W or Wo Pelvis 2-3 Views Left  Result Date: 07/25/2021 CLINICAL DATA:  Pain after a fall EXAM: DG HIP (WITH OR WITHOUT PELVIS) 2-3V LEFT COMPARISON:  None Available. FINDINGS: AP view of the pelvis and AP/frog leg views of the left hip. Femoral heads are located. Sacroiliac joints are symmetric. Osteopenia. Degenerate disc disease at L4-5. Subtle osseous irregularity in the left parasymphyseal pubic bone IMPRESSION: Suspect a left parasymphyseal pubic bone fracture, minimally displaced. Correlate with point tenderness. CT could confirm. Electronically Signed   By: Jeronimo Greaves M.D.   On: 07/25/2021 17:51        Scheduled Meds:  acetaminophen  650 mg Oral TID   apixaban  2.5 mg Oral BID   vitamin C  500 mg Oral Daily   cholecalciferol  1,000 Units Oral Daily   diltiazem  60 mg Oral Q8H   famotidine  20 mg Oral BID   latanoprost  1 drop Left Eye QHS   lisinopril  10 mg Oral Daily   loratadine  10 mg Oral Daily   metoprolol succinate  100 mg Oral Daily   montelukast  10 mg Oral QPM   multivitamin with minerals  1 tablet Oral Daily  omega-3 acid ethyl esters  1 g Oral Daily   predniSONE  2.5 mg Oral Q breakfast   timolol  1 drop Both Eyes BID   Continuous Infusions:     LOS: 2 days    Time spent: 39 min  Georgette Shell, MD 07/27/2021, 12:13 PM

## 2021-07-27 NOTE — NC FL2 (Signed)
[place] MEDICAID FL2 LEVEL OF CARE SCREENING TOOL     IDENTIFICATION  Patient Name: Emily Robertson Birthdate: 18-Aug-1930 Sex: female Admission Date (Current Location): 07/25/2021  [facility] and [place] Number:  Producer, television/film/video and Address:  [facility],  [address] [place], [place] [address]      Provider Number: [id number]  Attending Physician Name and Address:  Alwyn Ren, MD  Relative Name and Phone Number:       Current Level of Care: Hospital Recommended Level of Care: Skilled Nursing Facility Prior Approval Number:    Date Approved/Denied:   PASRR Number: [id number]  Discharge Plan: SNF    Current Diagnoses: Patient Active Problem List   Diagnosis Date Noted   Osteoporosis 07/25/2021   Rheumatoid arthritis (HCC) 07/25/2021   Pure hypercholesterolemia 07/25/2021   New onset a-fib ([facility]CC) 07/25/2021   Fall at home, initial encounter 07/25/2021   Closed pelvic fracture (HCC) 07/25/2021   Hyperthyroidism 08/03/2013   Angioedema 11/29/2012   HTN (hypertension) 11/29/2012    Orientation RESPIRATION BLADDER Height & Weight     Self, Time, Situation, Place  Normal Continent Weight: 59 kg Height:  5\' 4"  (162.6 cm)  BEHAVIORAL SYMPTOMS/MOOD NEUROLOGICAL BOWEL NUTRITION STATUS      Continent Diet (regular)  AMBULATORY STATUS COMMUNICATION OF NEEDS Skin   Extensive Assist Verbally Normal                       Personal Care Assistance Level of Assistance  Bathing, Feeding, Dressing Bathing Assistance: Limited assistance Feeding assistance: Independent Dressing Assistance: Limited assistance     Functional Limitations Info  Sight, Hearing, Speech Sight Info: Adequate Hearing Info: Adequate Speech Info: Adequate    SPECIAL CARE FACTORS FREQUENCY  PT (By licensed PT), OT (By licensed OT)     PT Frequency: 5 x weekly OT Frequency: 5 x weekly            Contractures Contractures Info: Not present    Additional  Factors Info  Code Status Code Status Info: full             Current Medications (07/27/2021):  This is the current hospital active medication list Current Facility-Administered Medications  Medication Dose Route Frequency Provider Last Rate Last Admin   acetaminophen (TYLENOL) tablet 1,000 mg  1,000 mg Oral Q6H PRN 07/29/2021, MD   1,000 mg at 07/27/21 07/29/21   acetaminophen (TYLENOL) tablet 650 mg  650 mg Oral TID 2440, MD   650 mg at 07/26/21 2144   apixaban (ELIQUIS) tablet 2.5 mg  2.5 mg Oral BID 2145, PA       ascorbic acid (VITAMIN C) tablet 500 mg  500 mg Oral Daily Azalee Course L, MD   500 mg at 07/27/21 07/29/21   cholecalciferol (VITAMIN D3) tablet 1,000 Units  1,000 Units Oral Daily 1027, MD   1,000 Units at 07/27/21 0840   diltiazem (CARDIZEM) tablet 60 mg  60 mg Oral Q8H Meng, [place], PA       famotidine (PEPCID) tablet 20 mg  20 mg Oral BID María Albina L, MD   20 mg at 07/27/21 0840   HYDROcodone-acetaminophen (NORCO/VICODIN) 5-325 MG per tablet 1 tablet  1 tablet Oral Q6H PRN 07/29/21, MD       latanoprost (XALATAN) 0.005 % ophthalmic solution 1 drop  1 drop Left Eye QHS Rometta Emery, MD   1 drop at 07/26/21  2200   loratadine (CLARITIN) tablet 10 mg  10 mg Oral Daily Earlie Lou L, MD   10 mg at 07/27/21 0840   metoprolol succinate (TOPROL-XL) 24 hr tablet 100 mg  100 mg Oral Daily Marjie Skiff E, PA-C   100 mg at 07/27/21 0839   montelukast (SINGULAIR) tablet 10 mg  10 mg Oral QPM Earlie Lou L, MD   10 mg at 07/26/21 1614   multivitamin with minerals tablet 1 tablet  1 tablet Oral Daily Rometta Emery, MD   1 tablet at 07/27/21 6808   omega-3 acid ethyl esters (LOVAZA) capsule 1 g  1 g Oral Daily Earlie Lou L, MD   1 g at 07/27/21 0839   ondansetron (ZOFRAN) injection 4 mg  4 mg Intravenous Q6H PRN Earlie Lou L, MD       predniSONE (DELTASONE) tablet 2.5 mg  2.5 mg Oral Q breakfast Mikeal Hawthorne, Mohammad  L, MD   2.5 mg at 07/27/21 0840   timolol (TIMOPTIC) 0.5 % ophthalmic solution 1 drop  1 drop Both Eyes BID Rometta Emery, MD   1 drop at 07/27/21 0841     Discharge Medications: Please see discharge summary for a list of discharge medications.  Relevant Imaging Results:  Relevant Lab Results:   Additional Information ssn:[id number]  Golda Acre, RN

## 2021-07-27 NOTE — Progress Notes (Addendum)
Progress Note  Patient Name: Emily Robertson Date of Encounter: 07/27/2021  [facility] Cardiologist: Olga Millers, MD   Subjective   Feeling well. No significant chest pain or worsening dyspnea  Inpatient Medications    Scheduled Meds:  acetaminophen  650 mg Oral TID   vitamin C  500 mg Oral Daily   cholecalciferol  1,000 Units Oral Daily   enoxaparin (LOVENOX) injection  40 mg Subcutaneous Daily   famotidine  20 mg Oral BID   latanoprost  1 drop Left Eye QHS   lisinopril  10 mg Oral Daily   loratadine  10 mg Oral Daily   metoprolol succinate  100 mg Oral Daily   montelukast  10 mg Oral QPM   multivitamin with minerals  1 tablet Oral Daily   omega-3 acid ethyl esters  1 g Oral Daily   predniSONE  2.5 mg Oral Q breakfast   timolol  1 drop Both Eyes BID   Continuous Infusions:  diltiazem (CARDIZEM) infusion 5 mg/hr (07/27/21 0420)   PRN Meds: acetaminophen, HYDROcodone-acetaminophen, ondansetron (ZOFRAN) IV   Vital Signs    Vitals:   07/26/21 1542 07/26/21 2004 07/26/21 2358 07/27/21 0415  BP: (!) 113/56 125/68 119/79 136/77  Pulse: 65 72 75 83  Resp: 18 20 17  (!) 23  Temp: 98.2 F (36.8 C) 98.2 F (36.8 C) 98 F (36.7 C) 97.7 F (36.5 C)  TempSrc: Oral     SpO2: 93% 96% 96% 97%  Weight:      Height:        Intake/Output Summary (Last 24 hours) at 07/27/2021 0926 Last data filed at 07/27/2021 0420 Gross per 24 hour  Intake 373.39 ml  Output 500 ml  Net -126.61 ml      07/26/2021    9:29 AM 10/28/2019    8:34 AM 07/24/2016    7:58 AM  Last 3 Weights  Weight (lbs) 130 lb 123 lb 6.4 oz 126 lb  Weight (kg) 58.968 kg 55.974 kg 57.153 kg      Telemetry    Atrial fibrillation, HR 80s overnight, increased 110 since 7AM this morning. - Personally Reviewed  ECG    Atrial fibrillation with PVCs - Personally Reviewed  Physical Exam   GEN: No acute distress.   Neck: No JVD Cardiac: RRR, no murmurs, rubs, or gallops.  Respiratory: Clear to  auscultation bilaterally. GI: Soft, nontender, non-distended  MS: No edema; No deformity. Neuro:  Nonfocal  Psych: Normal affect   Labs    High Sensitivity Troponin:   Recent Labs  Lab 07/25/21 2040 07/25/21 2247  TROPONINIHS 260* 195*     Chemistry Recent Labs  Lab 07/25/21 2040 07/26/21 0923 07/27/21 0827  NA 134* 135 136  K 3.6 3.3* 3.9  CL 100 103 103  CO2 23 23 25   GLUCOSE 145* 143* 124*  BUN 16 18 21   CREATININE 0.65 0.51 0.66  CALCIUM 9.4 9.3 9.2  MG  --  1.8 2.1  PROT 7.2  --   --   ALBUMIN 4.0  --   --   AST 33  --   --   ALT 23  --   --   ALKPHOS 95  --   --   BILITOT 1.0  --   --   GFRNONAA >60 >60 >60  ANIONGAP 11 9 8     Lipids  Recent Labs  Lab 07/26/21 0923  CHOL 144  TRIG 52  HDL 52  LDLCALC 82  CHOLHDL  2.8    Hematology Recent Labs  Lab 07/25/21 2040 07/26/21 0923  WBC 12.5* 10.2  RBC 4.83 4.27  HGB 14.2 12.9  HCT 42.3 37.0  MCV 87.6 86.7  MCH 29.4 30.2  MCHC 33.6 34.9  RDW 13.2 13.2  PLT 285 270   Thyroid  Recent Labs  Lab 07/26/21 0923  TSH 0.010*  FREET4 3.62*    BNPNo results for input(s): "BNP", "PROBNP" in the last 168 hours.  DDimer No results for input(s): "DDIMER" in the last 168 hours.   Radiology    ECHOCARDIOGRAM COMPLETE  Result Date: 07/26/2021    ECHOCARDIOGRAM REPORT   Patient Name:   Emily Robertson Date of Exam: 07/26/2021 Medical Rec #:  [id number]         Height:       62.0 in Accession #:    [id number]        Weight:       123.4 lb Date of Birth:  January 09, 1931         BSA:          1.557 m Patient Age:    88 years          BP:           136/83 mmHg Patient Gender: F                 HR:           81 bpm. Exam Location:  Inpatient Procedure: 2D Echo, Cardiac Doppler and Color Doppler Indications:    Atrial fibrillation  History:        Patient has no prior history of Echocardiogram examinations.                 Arrythmias:Atrial Fibrillation; Risk Factors:Hypertension.  Sonographer:    Eduard Roux  Referring Phys: [id number] CALLIE E GOODRICH IMPRESSIONS  1. Left ventricular ejection fraction, by estimation, is 60 to 65%. The left ventricle has normal function. The left ventricle has no regional wall motion abnormalities. Left ventricular diastolic function could not be evaluated.  2. Right ventricular systolic function is normal. The right ventricular size is normal. There is mildly elevated pulmonary artery systolic pressure.  3. Left atrial size was severely dilated.  4. Right atrial size was moderately dilated.  5. The mitral valve is normal in structure. Moderate to severe mitral valve regurgitation. No evidence of mitral stenosis.  6. Tricuspid valve regurgitation is mild to moderate.  7. The aortic valve is tricuspid. Aortic valve regurgitation is mild. No aortic stenosis is present.  8. The inferior vena cava is normal in size with greater than 50% respiratory variability, suggesting right atrial pressure of 3 mmHg. FINDINGS  Left Ventricle: Left ventricular ejection fraction, by estimation, is 60 to 65%. The left ventricle has normal function. The left ventricle has no regional wall motion abnormalities. The left ventricular internal cavity size was normal in size. There is  no left ventricular hypertrophy. Left ventricular diastolic function could not be evaluated due to atrial fibrillation. Left ventricular diastolic function could not be evaluated. Right Ventricle: The right ventricular size is normal. No increase in right ventricular wall thickness. Right ventricular systolic function is normal. There is mildly elevated pulmonary artery systolic pressure. The tricuspid regurgitant velocity is 2.91  m/s, and with an assumed right atrial pressure of 5 mmHg, the estimated right ventricular systolic pressure is 38.9 mmHg. Left Atrium: Left atrial size was severely dilated. Right Atrium: Right atrial size was moderately  dilated. Pericardium: There is no evidence of pericardial effusion. Mitral Valve: The  mitral valve is normal in structure. Mild to moderate mitral annular calcification. Moderate to severe mitral valve regurgitation, with eccentric anteriorly directed jet. No evidence of mitral valve stenosis. Tricuspid Valve: The tricuspid valve is normal in structure. Tricuspid valve regurgitation is mild to moderate. No evidence of tricuspid stenosis. Aortic Valve: The aortic valve is tricuspid. Aortic valve regurgitation is mild. Aortic regurgitation PHT measures 556 msec. No aortic stenosis is present. Aortic valve peak gradient measures 5.0 mmHg. Pulmonic Valve: The pulmonic valve was normal in structure. Pulmonic valve regurgitation is not visualized. No evidence of pulmonic stenosis. Aorta: The aortic root is normal in size and structure. Venous: The inferior vena cava is normal in size with greater than 50% respiratory variability, suggesting right atrial pressure of 3 mmHg. IAS/Shunts: No atrial level shunt detected by color flow Doppler.  LEFT VENTRICLE PLAX 2D LVIDd:         4.50 cm LVIDs:         3.00 cm LV PW:         1.20 cm LV IVS:        1.10 cm LVOT diam:     2.00 cm LVOT Area:     3.14 cm  RIGHT VENTRICLE          IVC RV Basal diam:  3.10 cm  IVC diam: 1.30 cm LEFT ATRIUM              Index        RIGHT ATRIUM           Index LA diam:        3.30 cm  2.12 cm/m   RA Area:     19.50 cm LA Vol (A2C):   111.0 ml 71.29 ml/m  RA Volume:   55.40 ml  35.58 ml/m LA Vol (A4C):   98.5 ml  63.26 ml/m LA Biplane Vol: 106.0 ml 68.08 ml/m  AORTIC VALVE              PULMONIC VALVE AV Vmax:      111.80 cm/s PV Vmax:       0.66 m/s AV Peak Grad: 5.0 mmHg    PV Peak grad:  1.7 mmHg AI PHT:       556 msec  AORTA Ao Root diam: 3.30 cm Ao Asc diam:  3.40 cm MITRAL VALVE                TRICUSPID VALVE MV Area (PHT): 4.23 cm     TR Peak grad:   33.9 mmHg MV Decel Time: 179 msec     TR Vmax:        291.00 cm/s MR Peak grad: 136.0 mmHg MR Vmax:      583.00 cm/s   SHUNTS MV E velocity: 114.33 cm/s  Systemic Diam:  2.00 cm Kardie Tobb DO Electronically signed by Thomasene Ripple DO Signature Date/Time: 07/26/2021/6:27:55 PM    Final    CT PELVIS WO CONTRAST  Result Date: 07/25/2021 CLINICAL DATA:  Hip trauma, fracture suspected, xray done. Fall, left hip injury EXAM: CT PELVIS WITHOUT CONTRAST TECHNIQUE: Multidetector CT imaging of the pelvis was performed following the standard protocol without intravenous contrast. RADIATION DOSE REDUCTION: This exam was performed according to the departmental dose-optimization program which includes automated exposure control, adjustment of the mA and/or kV according to patient size and/or use of iterative reconstruction technique. COMPARISON:  None Available. FINDINGS: Urinary  Tract: The visualized inferior pole right kidney is unremarkable. No hydroureter. The bladder is unremarkable. Bowel: Severe sigmoid diverticulosis without superimposed acute inflammatory change. The visualized large and small bowel are otherwise unremarkable. Appendix normal. Vascular/Lymphatic: Mild aortoiliac atherosclerotic calcification. No aortic aneurysm. No pathologic adenopathy within the pelvis. Reproductive:  Uterus unremarkable.  No adnexal masses. Other:  None Musculoskeletal: There are insufficiency fractures of the sacral ala bilaterally. There is an acute impacted fracture of the left superior pubic ramus. Subtle angular deformity of the inferior pubic ramus (axial image # 110, series 2) suggests a plastic fracture of the structure with fracture fragments in near anatomic alignment. The osseous structures are diffusely osteopenic. Degenerative changes are seen within the lumbar spine and hips bilaterally. No dislocation. IMPRESSION: 1. Acute impacted fracture of the left superior pubic ramus. 2. Acute plastic fracture of the left inferior pubic ramus with fracture fragments in near anatomic alignment. 3. Bilateral sacral alar insufficiency fractures. 4. Diffuse osteopenia. 5. Severe sigmoid  diverticulosis without superimposed acute inflammatory change. Electronically Signed   By: Fidela Salisbury M.D.   On: 07/25/2021 20:22   CT Cervical Spine Wo Contrast  Result Date: 07/25/2021 CLINICAL DATA:  Trauma. EXAM: CT HEAD WITHOUT CONTRAST CT CERVICAL SPINE WITHOUT CONTRAST TECHNIQUE: Multidetector CT imaging of the head and cervical spine was performed following the standard protocol without intravenous contrast. Multiplanar CT image reconstructions of the cervical spine were also generated. RADIATION DOSE REDUCTION: This exam was performed according to the departmental dose-optimization program which includes automated exposure control, adjustment of the mA and/or kV according to patient size and/or use of iterative reconstruction technique. COMPARISON:  None Available. FINDINGS: CT HEAD FINDINGS Brain: There is mild age-related atrophy and moderate chronic microvascular ischemic changes. Old left basal ganglia lacunar infarct. There is no acute intracranial hemorrhage. No mass effect or midline shift. No extra-axial fluid collection. Vascular: No hyperdense vessel or unexpected calcification. Skull: Normal. Negative for fracture or focal lesion. Sinuses/Orbits: The visualized paranasal sinuses and mastoid air cells are clear. Right globe scleral banding. Other: None CT CERVICAL SPINE FINDINGS Alignment: No acute subluxation. Skull base and vertebrae: No acute fracture. Soft tissues and spinal canal: No prevertebral fluid or swelling. No visible canal hematoma. Disc levels:  No acute findings.  Multilevel degenerative changes. Upper chest: Biapical subpleural scarring. Other: None IMPRESSION: 1. No acute intracranial pathology. 2. Age-related atrophy and chronic microvascular ischemic changes. Old left basal ganglia lacunar infarct. 3. No acute/traumatic cervical spine pathology. Electronically Signed   By: Anner Crete M.D.   On: 07/25/2021 20:17   CT Head Wo Contrast  Result Date:  07/25/2021 CLINICAL DATA:  Trauma. EXAM: CT HEAD WITHOUT CONTRAST CT CERVICAL SPINE WITHOUT CONTRAST TECHNIQUE: Multidetector CT imaging of the head and cervical spine was performed following the standard protocol without intravenous contrast. Multiplanar CT image reconstructions of the cervical spine were also generated. RADIATION DOSE REDUCTION: This exam was performed according to the departmental dose-optimization program which includes automated exposure control, adjustment of the mA and/or kV according to patient size and/or use of iterative reconstruction technique. COMPARISON:  None Available. FINDINGS: CT HEAD FINDINGS Brain: There is mild age-related atrophy and moderate chronic microvascular ischemic changes. Old left basal ganglia lacunar infarct. There is no acute intracranial hemorrhage. No mass effect or midline shift. No extra-axial fluid collection. Vascular: No hyperdense vessel or unexpected calcification. Skull: Normal. Negative for fracture or focal lesion. Sinuses/Orbits: The visualized paranasal sinuses and mastoid air cells are clear. Right globe scleral banding.  Other: None CT CERVICAL SPINE FINDINGS Alignment: No acute subluxation. Skull base and vertebrae: No acute fracture. Soft tissues and spinal canal: No prevertebral fluid or swelling. No visible canal hematoma. Disc levels:  No acute findings.  Multilevel degenerative changes. Upper chest: Biapical subpleural scarring. Other: None IMPRESSION: 1. No acute intracranial pathology. 2. Age-related atrophy and chronic microvascular ischemic changes. Old left basal ganglia lacunar infarct. 3. No acute/traumatic cervical spine pathology. Electronically Signed   By: Anner Crete M.D.   On: 07/25/2021 20:17   DG Hip Unilat W or Wo Pelvis 2-3 Views Left  Result Date: 07/25/2021 CLINICAL DATA:  Pain after a fall EXAM: DG HIP (WITH OR WITHOUT PELVIS) 2-3V LEFT COMPARISON:  None Available. FINDINGS: AP view of the pelvis and AP/frog leg  views of the left hip. Femoral heads are located. Sacroiliac joints are symmetric. Osteopenia. Degenerate disc disease at L4-5. Subtle osseous irregularity in the left parasymphyseal pubic bone IMPRESSION: Suspect a left parasymphyseal pubic bone fracture, minimally displaced. Correlate with point tenderness. CT could confirm. Electronically Signed   By: Abigail Miyamoto M.D.   On: 07/25/2021 17:51    Cardiac Studies   Echo 07/26/2021 1. Left ventricular ejection fraction, by estimation, is 60 to 65%. The  left ventricle has normal function. The left ventricle has no regional  wall motion abnormalities. Left ventricular diastolic function could not  be evaluated.   2. Right ventricular systolic function is normal. The right ventricular  size is normal. There is mildly elevated pulmonary artery systolic  pressure.   3. Left atrial size was severely dilated.   4. Right atrial size was moderately dilated.   5. The mitral valve is normal in structure. Moderate to severe mitral  valve regurgitation. No evidence of mitral stenosis.   6. Tricuspid valve regurgitation is mild to moderate.   7. The aortic valve is tricuspid. Aortic valve regurgitation is mild. No  aortic stenosis is present.   8. The inferior vena cava is normal in size with greater than 50%  respiratory variability, suggesting right atrial pressure of 3 mmHg.  Patient Profile     88 y.o. female with PMH of HTN, HLD, RA, and glaucoma who has no prior cardiac history who presented with hip pain and head laceration after a fall. CT showed pubic rami fracture and sacral alar fracture. CT of head no acute finding. She was in new afib with RVR on arrival. Trop 260 --> 195.   Assessment & Plan    Atrial fibrillation with RVR  - in the setting of recent fall with fracture and uncontrolled hyperthyroidism  - echo showed EF 60-65%, moderate to severe MR, mild AI  - HR 80s overnight, went up to 110s this morning, will discuss with MD,  consider decrease lisinopril to 5mg  daily, stop IV cardizem which is currently running at 5, start patient on slightly higher dose of oral cardizem CD 240mg  daily.   Hyperthyroidism  - per patient's son, she underwent radiative iodine a few years ago. She was noted to have elevated thyroid hormone 1-2 weeks ago at [facility] office as well.   - will need to treat hyperthyroidism to get HR under better control  Hypertension  Hyperlipidemia  For questions or updates, please contact [place] Please consult [email or web address] for contact info under   Signed, Almyra Deforest, [place]  07/27/2021, 9:26 AM   As above, patient seen and examined.  She denies dyspnea or chest pain.  She remains in atrial fibrillation but her  heart rate is controlled.  We will continue Toprol at present dose and transition to Cardizem 60 mg by mouth every 8 hours.  If heart rate and blood pressure are stable we will change to CD tomorrow.  She is noted to be hyperthyroid which could be contributing to her atrial fibrillation.  We will leave to primary care for treatment.  She is also noted to have moderate to severe mitral regurgitation on her echocardiogram which could be contributing to atrial fibrillation.  I had another long discussion with patient and son this morning.  They would like to anticoagulate to decrease the risk of CVA.  She will be in a rehabilitation facility following discharge and will have assistance.  If she follows more in the future we will discontinue anticoagulation at that time.  Note her blood pressure is trending down and we will discontinue lisinopril given addition of Cardizem.  Kirk Ruths, MD

## 2021-07-28 DIAGNOSIS — E785 Hyperlipidemia, unspecified: Secondary | ICD-10-CM

## 2021-07-28 DIAGNOSIS — I10 Essential (primary) hypertension: Secondary | ICD-10-CM | POA: Diagnosis not present

## 2021-07-28 DIAGNOSIS — R778 Other specified abnormalities of plasma proteins: Secondary | ICD-10-CM | POA: Diagnosis not present

## 2021-07-28 DIAGNOSIS — I4891 Unspecified atrial fibrillation: Secondary | ICD-10-CM | POA: Diagnosis not present

## 2021-07-28 MED ORDER — ATORVASTATIN CALCIUM 20 MG PO TABS
20.0000 mg | ORAL_TABLET | Freq: Every day | ORAL | Status: DC
  Administered 2021-07-28 – 2021-07-30 (×3): 20 mg via ORAL
  Filled 2021-07-28 (×3): qty 1

## 2021-07-28 MED ORDER — METHIMAZOLE 10 MG PO TABS
10.0000 mg | ORAL_TABLET | Freq: Two times a day (BID) | ORAL | Status: DC
  Administered 2021-07-28 – 2021-07-31 (×7): 10 mg via ORAL
  Filled 2021-07-28 (×7): qty 1

## 2021-07-28 MED ORDER — DILTIAZEM HCL ER COATED BEADS 180 MG PO CP24
180.0000 mg | ORAL_CAPSULE | Freq: Every day | ORAL | Status: DC
  Administered 2021-07-28 – 2021-07-31 (×4): 180 mg via ORAL
  Filled 2021-07-28 (×4): qty 1

## 2021-07-28 NOTE — Progress Notes (Addendum)
Progress Note  Patient Name: Emily Robertson Date of Encounter: 07/28/2021  [facility] Cardiologist: Olga Millers, MD   Subjective   No issues overnight - HR control is adequate. Started on low dose Eliquis yesterday for anticoagulation.  Inpatient Medications    Scheduled Meds:  acetaminophen  650 mg Oral TID   apixaban  2.5 mg Oral BID   vitamin C  500 mg Oral Daily   cholecalciferol  1,000 Units Oral Daily   diltiazem  60 mg Oral Q8H   famotidine  20 mg Oral BID   latanoprost  1 drop Left Eye QHS   loratadine  10 mg Oral Daily   methimazole  10 mg Oral BID   metoprolol succinate  100 mg Oral Daily   montelukast  10 mg Oral QPM   multivitamin with minerals  1 tablet Oral Daily   omega-3 acid ethyl esters  1 g Oral Daily   predniSONE  2.5 mg Oral Q breakfast   timolol  1 drop Both Eyes BID   Continuous Infusions:   PRN Meds: acetaminophen, HYDROcodone-acetaminophen, ondansetron (ZOFRAN) IV   Vital Signs    Vitals:   07/27/21 0415 07/27/21 1236 07/27/21 2127 07/28/21 0551  BP: 136/77 113/70 (!) 143/81 (!) 147/88  Pulse: 83 92 99 90  Resp: (!) 23 16 (!) 21 20  Temp: 97.7 F (36.5 C) 97.7 F (36.5 C) 97.8 F (36.6 C) 97.8 F (36.6 C)  TempSrc:  Oral    SpO2: 97% 97% 97%   Weight:      Height:        Intake/Output Summary (Last 24 hours) at 07/28/2021 0939 Last data filed at 07/28/2021 0742 Gross per 24 hour  Intake 600 ml  Output 500 ml  Net 100 ml      07/26/2021    9:29 AM 10/28/2019    8:34 AM 07/24/2016    7:58 AM  Last 3 Weights  Weight (lbs) 130 lb 123 lb 6.4 oz 126 lb  Weight (kg) 58.968 kg 55.974 kg 57.153 kg      Telemetry    Atrial fibrillation- rate controlled- Personally Reviewed  ECG    N/A  Physical Exam   GEN: No acute distress.   Neck: No JVD Cardiac: irregularly irregular rhythm, 3/6 blowing systolic murmur at LLSB Respiratory: Clear to auscultation bilaterally. GI: Soft, nontender, non-distended  MS: No edema;  No deformity. Neuro:  Nonfocal  Psych: Normal affect   Labs    High Sensitivity Troponin:   Recent Labs  Lab 07/25/21 2040 07/25/21 2247  TROPONINIHS 260* 195*     Chemistry Recent Labs  Lab 07/25/21 2040 07/26/21 0923 07/27/21 0827  NA 134* 135 136  K 3.6 3.3* 3.9  CL 100 103 103  CO2 23 23 25   GLUCOSE 145* 143* 124*  BUN 16 18 21   CREATININE 0.65 0.51 0.66  CALCIUM 9.4 9.3 9.2  MG  --  1.8 2.1  PROT 7.2  --   --   ALBUMIN 4.0  --   --   AST 33  --   --   ALT 23  --   --   ALKPHOS 95  --   --   BILITOT 1.0  --   --   GFRNONAA >60 >60 >60  ANIONGAP 11 9 8     Lipids  Recent Labs  Lab 07/26/21 0923  CHOL 144  TRIG 52  HDL 52  LDLCALC 82  CHOLHDL 2.8    Hematology  Recent Labs  Lab 07/25/21 2040 07/26/21 0923  WBC 12.5* 10.2  RBC 4.83 4.27  HGB 14.2 12.9  HCT 42.3 37.0  MCV 87.6 86.7  MCH 29.4 30.2  MCHC 33.6 34.9  RDW 13.2 13.2  PLT 285 270   Thyroid  Recent Labs  Lab 07/26/21 0923  TSH 0.010*  FREET4 3.62*    BNPNo results for input(s): "BNP", "PROBNP" in the last 168 hours.  DDimer No results for input(s): "DDIMER" in the last 168 hours.   Radiology    ECHOCARDIOGRAM COMPLETE  Result Date: 07/26/2021    ECHOCARDIOGRAM REPORT   Patient Name:   Emily Robertson Date of Exam: 07/26/2021 Medical Rec #:  [id number]         Height:       62.0 in Accession #:    [id number]        Weight:       123.4 lb Date of Birth:  06-26-1930         BSA:          1.557 m Patient Age:    88 years          BP:           136/83 mmHg Patient Gender: F                 HR:           81 bpm. Exam Location:  Inpatient Procedure: 2D Echo, Cardiac Doppler and Color Doppler Indications:    Atrial fibrillation  History:        Patient has no prior history of Echocardiogram examinations.                 Arrythmias:Atrial Fibrillation; Risk Factors:Hypertension.  Sonographer:    Eduard Roux Referring Phys: [id number] CALLIE E GOODRICH IMPRESSIONS  1. Left ventricular  ejection fraction, by estimation, is 60 to 65%. The left ventricle has normal function. The left ventricle has no regional wall motion abnormalities. Left ventricular diastolic function could not be evaluated.  2. Right ventricular systolic function is normal. The right ventricular size is normal. There is mildly elevated pulmonary artery systolic pressure.  3. Left atrial size was severely dilated.  4. Right atrial size was moderately dilated.  5. The mitral valve is normal in structure. Moderate to severe mitral valve regurgitation. No evidence of mitral stenosis.  6. Tricuspid valve regurgitation is mild to moderate.  7. The aortic valve is tricuspid. Aortic valve regurgitation is mild. No aortic stenosis is present.  8. The inferior vena cava is normal in size with greater than 50% respiratory variability, suggesting right atrial pressure of 3 mmHg. FINDINGS  Left Ventricle: Left ventricular ejection fraction, by estimation, is 60 to 65%. The left ventricle has normal function. The left ventricle has no regional wall motion abnormalities. The left ventricular internal cavity size was normal in size. There is  no left ventricular hypertrophy. Left ventricular diastolic function could not be evaluated due to atrial fibrillation. Left ventricular diastolic function could not be evaluated. Right Ventricle: The right ventricular size is normal. No increase in right ventricular wall thickness. Right ventricular systolic function is normal. There is mildly elevated pulmonary artery systolic pressure. The tricuspid regurgitant velocity is 2.91  m/s, and with an assumed right atrial pressure of 5 mmHg, the estimated right ventricular systolic pressure is 38.9 mmHg. Left Atrium: Left atrial size was severely dilated. Right Atrium: Right atrial size was moderately dilated. Pericardium: There is no  evidence of pericardial effusion. Mitral Valve: The mitral valve is normal in structure. Mild to moderate mitral annular  calcification. Moderate to severe mitral valve regurgitation, with eccentric anteriorly directed jet. No evidence of mitral valve stenosis. Tricuspid Valve: The tricuspid valve is normal in structure. Tricuspid valve regurgitation is mild to moderate. No evidence of tricuspid stenosis. Aortic Valve: The aortic valve is tricuspid. Aortic valve regurgitation is mild. Aortic regurgitation PHT measures 556 msec. No aortic stenosis is present. Aortic valve peak gradient measures 5.0 mmHg. Pulmonic Valve: The pulmonic valve was normal in structure. Pulmonic valve regurgitation is not visualized. No evidence of pulmonic stenosis. Aorta: The aortic root is normal in size and structure. Venous: The inferior vena cava is normal in size with greater than 50% respiratory variability, suggesting right atrial pressure of 3 mmHg. IAS/Shunts: No atrial level shunt detected by color flow Doppler.  LEFT VENTRICLE PLAX 2D LVIDd:         4.50 cm LVIDs:         3.00 cm LV PW:         1.20 cm LV IVS:        1.10 cm LVOT diam:     2.00 cm LVOT Area:     3.14 cm  RIGHT VENTRICLE          IVC RV Basal diam:  3.10 cm  IVC diam: 1.30 cm LEFT ATRIUM              Index        RIGHT ATRIUM           Index LA diam:        3.30 cm  2.12 cm/m   RA Area:     19.50 cm LA Vol (A2C):   111.0 ml 71.29 ml/m  RA Volume:   55.40 ml  35.58 ml/m LA Vol (A4C):   98.5 ml  63.26 ml/m LA Biplane Vol: 106.0 ml 68.08 ml/m  AORTIC VALVE              PULMONIC VALVE AV Vmax:      111.80 cm/s PV Vmax:       0.66 m/s AV Peak Grad: 5.0 mmHg    PV Peak grad:  1.7 mmHg AI PHT:       556 msec  AORTA Ao Root diam: 3.30 cm Ao Asc diam:  3.40 cm MITRAL VALVE                TRICUSPID VALVE MV Area (PHT): 4.23 cm     TR Peak grad:   33.9 mmHg MV Decel Time: 179 msec     TR Vmax:        291.00 cm/s MR Peak grad: 136.0 mmHg MR Vmax:      583.00 cm/s   SHUNTS MV E velocity: 114.33 cm/s  Systemic Diam: 2.00 cm Kardie Tobb DO Electronically signed by Thomasene Ripple DO Signature  Date/Time: 07/26/2021/6:27:55 PM    Final     Cardiac Studies   Echo 07/26/2021 1. Left ventricular ejection fraction, by estimation, is 60 to 65%. The  left ventricle has normal function. The left ventricle has no regional  wall motion abnormalities. Left ventricular diastolic function could not  be evaluated.   2. Right ventricular systolic function is normal. The right ventricular  size is normal. There is mildly elevated pulmonary artery systolic  pressure.   3. Left atrial size was severely dilated.   4. Right atrial size was moderately dilated.  5. The mitral valve is normal in structure. Moderate to severe mitral  valve regurgitation. No evidence of mitral stenosis.   6. Tricuspid valve regurgitation is mild to moderate.   7. The aortic valve is tricuspid. Aortic valve regurgitation is mild. No  aortic stenosis is present.   8. The inferior vena cava is normal in size with greater than 50%  respiratory variability, suggesting right atrial pressure of 3 mmHg.  Patient Profile     88 y.o. female with PMH of HTN, HLD, RA, and glaucoma who has no prior cardiac history who presented with hip pain and head laceration after a fall. CT showed pubic rami fracture and sacral alar fracture. CT of head no acute finding. She was in new afib with RVR on arrival. Trop 260 --> 195.   Assessment & Plan    Atrial fibrillation with RVR  - in the setting of recent fall with fracture and uncontrolled hyperthyroidism  - echo showed EF 60-65%, moderate to severe MR, mild AI  - HR now well-controlled- switch to cardizem CD 180 mg daily  Hyperthyroidism  - per patient's son, she underwent radiative iodine a few years ago. She was noted to have elevated thyroid hormone 1-2 weeks ago at [employer] office as well.   - will need to treat hyperthyroidism to get HR under better control  Hypertension - controlled on current regimen  Hyperlipidemia - LDL 82, given elevated troponin and unknown CAD status,  start atorvastatin 20 mg QHS  Moderate to severe MR -  impressive murmur on exam -may have provoked afib. This is a new finding - would consider further outpatient evaluation if she is symptomatic, ?mitra-clip.  No further suggestions at this time. Cardiology will sign-off. Call with questions.  [facility] will sign off.   Medication Recommendations:  as above Other recommendations (labs, testing, etc):  none Follow up as an outpatient:  Dr. Jens Som or APP   For questions or updates, please contact [facility] Please consult [email or web address] for contact info under   Chrystie Nose, MD, Milagros Loll  [place]  [facility]  Medical Director of the [facility]  [facility] Diplomate of the American Board of Clinical Lipidology Attending Cardiologist  Direct Dial: [phone]  Fax: [phone]  Website:  [email or web address]  Chrystie Nose, MD  07/28/2021, 9:39 AM

## 2021-07-28 NOTE — Progress Notes (Signed)
PROGRESS NOTE    Emily Robertson  PPI:[id number] DOB: 1930-06-29 DOA: 07/25/2021 PCP: Rogers Blocker, FNP   Brief Narrative:88 yo female  lives alone was walking from kitchen to the den and heat left upper forehead and has sutures on the forehead.   She complains of some shortness of breath denies any chest pain.  She was also found to be in A-fib RVR in the ER which is new for her. She has closed pelvic fracture bilateral sacral insufficiency fracture and a nondisplaced left superior ramus fracture. Her history significant for hypertension hyperlipidemia glaucoma and arthritis.  Assessment & Plan:   Principal Problem:   New onset a-fib [facility]) Active Problems:   HTN (hypertension)   Hyperthyroidism   Osteoporosis   Rheumatoid arthritis (HCC)   Pure hypercholesterolemia   Fall at home, initial encounter   Closed pelvic fracture (HCC)  #1 pelvic fracture status post fall bilateral sacral insufficiency fractures and left-sided pubic Ramey fracture.  Seen by Ortho. PT OT consulted recommending SNF Weightbearing as tolerated. Follow-up with Ortho in 2 to 3 weeks for recheck and repeat x-rays.  #2 new onset A-fib seen by cardiology appreciate their input.  On Cardizem and metoprolol.   Echo ejection fraction 60 to 65%.  Mildly elevated pulmonary artery systolic pressure.  Mild to moderate tricuspid valve regurgitation.  No aortic stenosis. TSH 0.010 Free T4 and T3 elevated K 3.9 mag 2.1  Patient was started on Eliquis today after discussion with patient and his son.  #3 essential hypertension continue Cardizem and Toprol  #4 history of rheumatoid arthritis and osteoporosis on prednisone 2.5 mg daily  #6 hyperlipidemia on Lovaza  #7 glaucoma continue Xalatan eyedrops  #8 seasonal allergies on Claritin and Singulair  #9 hypokalemia and hypomagnesemia repleted potassium 3.9 magnesium 2.1.    #10 hypothyroidism with low TSH and elevated T4 and T3 discussed with Dr. Lucianne Muss will  obtain thyroid antibodies and started on methimazole 10 mg twice a day.  She will need outpatient follow-up with endocrinology.  Estimated body mass index is 22.31 kg/m as calculated from the following:   Height as of this encounter: 5\' 4"  (1.626 m).   Weight as of this encounter: 59 kg.  DVT prophylaxis: Lovenox Code Status: full Family Communication: none Disposition Plan:  Status is: Inpatient Remains inpatient appropriate because: A-fib RVR   Consultants:  Cardiology and Ortho  Procedures: None Antimicrobials: None  Subjective:  Patient is sitting up in chair going to eat breakfast she is awake alert Has complaints of pelvic pain more when moving no other new complaints  Objective: Vitals:   07/27/21 1236 07/27/21 2127 07/28/21 0551 07/28/21 1208  BP: 113/70 (!) 143/81 (!) 147/88 136/83  Pulse: 92 99 90 90  Resp: 16 (!) 21 20 16   Temp: 97.7 F (36.5 C) 97.8 F (36.6 C) 97.8 F (36.6 C) 97.8 F (36.6 C)  TempSrc: Oral     SpO2: 97% 97%  96%  Weight:      Height:        Intake/Output Summary (Last 24 hours) at 07/28/2021 1325 Last data filed at 07/28/2021 0742 Gross per 24 hour  Intake 360 ml  Output 500 ml  Net -140 ml    Filed Weights   07/26/21 0929  Weight: 59 kg    Examination:  General exam: Appears in mild distress due to pain  respiratory system: Diminished breath sounds at the bases to auscultation. Respiratory effort normal. Cardiovascular system: S1 & S2 heard,irreg No  JVD, murmurs, rubs, gallops or clicks. No pedal edema. Gastrointestinal system: Abdomen is nondistended, soft and nontender. No organomegaly or masses felt. Normal bowel sounds heard. Central nervous system: Alert and oriented. No focal neurological deficits. Extremities: Symmetric 5 x 5 power. Skin: No rashes, lesions or ulcers Psychiatry: Judgement and insight appear normal. Mood & affect appropriate.     Data Reviewed: I have personally reviewed following labs and imaging  studies  CBC: Recent Labs  Lab 07/25/21 2040 07/26/21 0923  WBC 12.5* 10.2  NEUTROABS 10.4*  --   HGB 14.2 12.9  HCT 42.3 37.0  MCV 87.6 86.7  PLT 285 270    Basic Metabolic Panel: Recent Labs  Lab 07/25/21 2040 07/26/21 0923 07/27/21 0827  NA 134* 135 136  K 3.6 3.3* 3.9  CL 100 103 103  CO2 23 23 25   GLUCOSE 145* 143* 124*  BUN 16 18 21   CREATININE 0.65 0.51 0.66  CALCIUM 9.4 9.3 9.2  MG  --  1.8 2.1    GFR: Estimated Creatinine Clearance: 41.2 mL/min (by C-G formula based on SCr of 0.66 mg/dL). Liver Function Tests: Recent Labs  Lab 07/25/21 2040  AST 33  ALT 23  ALKPHOS 95  BILITOT 1.0  PROT 7.2  ALBUMIN 4.0    No results for input(s): "LIPASE", "AMYLASE" in the last 168 hours. No results for input(s): "AMMONIA" in the last 168 hours. Coagulation Profile: No results for input(s): "INR", "PROTIME" in the last 168 hours. Cardiac Enzymes: No results for input(s): "CKTOTAL", "CKMB", "CKMBINDEX", "TROPONINI" in the last 168 hours. BNP (last 3 results) No results for input(s): "PROBNP" in the last 8760 hours. HbA1C: No results for input(s): "HGBA1C" in the last 72 hours. CBG: No results for input(s): "GLUCAP" in the last 168 hours. Lipid Profile: Recent Labs    07/26/21 0923  CHOL 144  HDL 52  LDLCALC 82  TRIG 52  CHOLHDL 2.8    Thyroid Function Tests: Recent Labs    07/26/21 0923  TSH 0.010*  FREET4 3.62*  T3FREE 5.8*    Anemia Panel: No results for input(s): "VITAMINB12", "FOLATE", "FERRITIN", "TIBC", "IRON", "RETICCTPCT" in the last 72 hours. Sepsis Labs: No results for input(s): "PROCALCITON", "LATICACIDVEN" in the last 168 hours.  No results found for this or any previous visit (from the past 240 hour(s)).       Radiology Studies: No results found.      Scheduled Meds:  acetaminophen  650 mg Oral TID   apixaban  2.5 mg Oral BID   vitamin C  500 mg Oral Daily   atorvastatin  20 mg Oral QHS   cholecalciferol  1,000  Units Oral Daily   diltiazem  180 mg Oral Daily   famotidine  20 mg Oral BID   latanoprost  1 drop Left Eye QHS   loratadine  10 mg Oral Daily   methimazole  10 mg Oral BID   metoprolol succinate  100 mg Oral Daily   montelukast  10 mg Oral QPM   multivitamin with minerals  1 tablet Oral Daily   omega-3 acid ethyl esters  1 g Oral Daily   predniSONE  2.5 mg Oral Q breakfast   timolol  1 drop Both Eyes BID   Continuous Infusions:     LOS: 3 days    Time spent: 39 min  07/28/21, MD 07/28/2021, 1:25 PM

## 2021-07-29 DIAGNOSIS — I4891 Unspecified atrial fibrillation: Secondary | ICD-10-CM | POA: Diagnosis not present

## 2021-07-29 NOTE — Progress Notes (Signed)
PROGRESS NOTE    Emily Robertson  KGU:[id number] DOB: 03-03-1930 DOA: 07/25/2021 PCP: Rogers Blocker, FNP   Brief Narrative:88 yo female  lives alone was walking from kitchen to the den and heat left upper forehead and has sutures on the forehead.   She complains of some shortness of breath denies any chest pain.  She was also found to be in A-fib RVR in the ER which is new for her. She has closed pelvic fracture bilateral sacral insufficiency fracture and a nondisplaced left superior ramus fracture. Her history significant for hypertension hyperlipidemia glaucoma and arthritis.  Assessment & Plan:   Principal Problem:   New onset a-fib [facility]) Active Problems:   HTN (hypertension)   Hyperthyroidism   Osteoporosis   Rheumatoid arthritis (HCC)   Pure hypercholesterolemia   Fall at home, initial encounter   Closed pelvic fracture (HCC)  #1 pelvic fracture status post fall bilateral sacral insufficiency fractures and left-sided pubic Ramey fracture.  Seen by Ortho. PT OT consulted recommending SNF Weightbearing as tolerated. Follow-up with Ortho in 2 to 3 weeks for recheck and repeat x-rays.  #2 new onset A-fib seen by cardiology appreciate their input.  On Cardizem and metoprolol.   Echo ejection fraction 60 to 65%.  Mildly elevated pulmonary artery systolic pressure.  Mild to moderate tricuspid valve regurgitation.  No aortic stenosis. TSH 0.010 Free T4 and T3 elevated K 3.9 mag 2.1  Patient was started on Eliquis today after discussion with patient and his son.  #3 essential hypertension continue Cardizem and Toprol  #4 history of rheumatoid arthritis and osteoporosis on prednisone 2.5 mg daily  #6 hyperlipidemia on Lovaza  #7 glaucoma continue Xalatan eyedrops  #8 seasonal allergies on Claritin and Singulair  #9 hypokalemia and hypomagnesemia repleted potassium 3.9 magnesium 2.1.    #10 hypothyroidism with low TSH and elevated T4 and T3 discussed with Dr. Lucianne Muss will  obtain thyroid antibodies and started on methimazole 10 mg twice a day.  She will need outpatient follow-up with endocrinology.  Estimated body mass index is 22.31 kg/m as calculated from the following:   Height as of this encounter: 5\' 4"  (1.626 m).   Weight as of this encounter: 59 kg.  DVT prophylaxis: Lovenox Code Status: full Family Communication: none Disposition Plan:  Status is: Inpatient Remains inpatient appropriate because: A-fib RVR   Consultants:  Cardiology and Ortho  Procedures: None Antimicrobials: None  Subjective:  She is sitting up in chair trying to eat breakfast she had a good night denies any new complaints denies shortness of breath or chest pain  Objective: Vitals:   07/28/21 2043 07/29/21 0640 07/29/21 0957 07/29/21 1211  BP: 139/62 (!) 159/94 (!) 159/82 (!) 110/97  Pulse: 85 88 95 93  Resp: (!) 21 18  18   Temp: (!) 97.4 F (36.3 C) 97.6 F (36.4 C)  97.6 F (36.4 C)  TempSrc: Oral Oral    SpO2: 96% 97%  98%  Weight:      Height:        Intake/Output Summary (Last 24 hours) at 07/29/2021 1338 Last data filed at 07/29/2021 1100 Gross per 24 hour  Intake 510 ml  Output 1550 ml  Net -1040 ml    Filed Weights   07/26/21 0929  Weight: 59 kg    Examination:  General exam: Appears in mild distress due to pain  respiratory system: Diminished breath sounds at the bases to auscultation. Respiratory effort normal. Cardiovascular system: S1 & S2 heard,irreg No JVD,  murmurs, rubs, gallops or clicks. No pedal edema. Gastrointestinal system: Abdomen is nondistended, soft and nontender. No organomegaly or masses felt. Normal bowel sounds heard. Central nervous system: Alert and oriented. No focal neurological deficits. Extremities: Symmetric 5 x 5 power. Skin: No rashes, lesions or ulcers Psychiatry: Judgement and insight appear normal. Mood & affect appropriate.     Data Reviewed: I have personally reviewed following labs and imaging  studies  CBC: Recent Labs  Lab 07/25/21 2040 07/26/21 0923  WBC 12.5* 10.2  NEUTROABS 10.4*  --   HGB 14.2 12.9  HCT 42.3 37.0  MCV 87.6 86.7  PLT 285 270    Basic Metabolic Panel: Recent Labs  Lab 07/25/21 2040 07/26/21 0923 07/27/21 0827  NA 134* 135 136  K 3.6 3.3* 3.9  CL 100 103 103  CO2 23 23 25   GLUCOSE 145* 143* 124*  BUN 16 18 21   CREATININE 0.65 0.51 0.66  CALCIUM 9.4 9.3 9.2  MG  --  1.8 2.1    GFR: Estimated Creatinine Clearance: 41.2 mL/min (by C-G formula based on SCr of 0.66 mg/dL). Liver Function Tests: Recent Labs  Lab 07/25/21 2040  AST 33  ALT 23  ALKPHOS 95  BILITOT 1.0  PROT 7.2  ALBUMIN 4.0    No results for input(s): "LIPASE", "AMYLASE" in the last 168 hours. No results for input(s): "AMMONIA" in the last 168 hours. Coagulation Profile: No results for input(s): "INR", "PROTIME" in the last 168 hours. Cardiac Enzymes: No results for input(s): "CKTOTAL", "CKMB", "CKMBINDEX", "TROPONINI" in the last 168 hours. BNP (last 3 results) No results for input(s): "PROBNP" in the last 8760 hours. HbA1C: No results for input(s): "HGBA1C" in the last 72 hours. CBG: No results for input(s): "GLUCAP" in the last 168 hours. Lipid Profile: No results for input(s): "CHOL", "HDL", "LDLCALC", "TRIG", "CHOLHDL", "LDLDIRECT" in the last 72 hours.  Thyroid Function Tests: No results for input(s): "TSH", "T4TOTAL", "FREET4", "T3FREE", "THYROIDAB" in the last 72 hours.  Anemia Panel: No results for input(s): "VITAMINB12", "FOLATE", "FERRITIN", "TIBC", "IRON", "RETICCTPCT" in the last 72 hours. Sepsis Labs: No results for input(s): "PROCALCITON", "LATICACIDVEN" in the last 168 hours.  No results found for this or any previous visit (from the past 240 hour(s)).       Radiology Studies: No results found.      Scheduled Meds:  acetaminophen  650 mg Oral TID   apixaban  2.5 mg Oral BID   vitamin C  500 mg Oral Daily   atorvastatin  20 mg  Oral QHS   cholecalciferol  1,000 Units Oral Daily   diltiazem  180 mg Oral Daily   famotidine  20 mg Oral BID   latanoprost  1 drop Left Eye QHS   loratadine  10 mg Oral Daily   methimazole  10 mg Oral BID   metoprolol succinate  100 mg Oral Daily   montelukast  10 mg Oral QPM   multivitamin with minerals  1 tablet Oral Daily   omega-3 acid ethyl esters  1 g Oral Daily   predniSONE  2.5 mg Oral Q breakfast   timolol  1 drop Both Eyes BID   Continuous Infusions:     LOS: 4 days    Time spent: 39 min  , MD 07/29/2021, 1:38 PM

## 2021-07-30 DIAGNOSIS — I4891 Unspecified atrial fibrillation: Secondary | ICD-10-CM | POA: Diagnosis not present

## 2021-07-30 LAB — COMPREHENSIVE METABOLIC PANEL
ALT: 40 U/L (ref 0–44)
AST: 38 U/L (ref 15–41)
Albumin: 3.4 g/dL — ABNORMAL LOW (ref 3.5–5.0)
Alkaline Phosphatase: 73 U/L (ref 38–126)
Anion gap: 9 (ref 5–15)
BUN: 22 mg/dL (ref 8–23)
CO2: 24 mmol/L (ref 22–32)
Calcium: 9.3 mg/dL (ref 8.9–10.3)
Chloride: 102 mmol/L (ref 98–111)
Creatinine, Ser: 0.61 mg/dL (ref 0.44–1.00)
GFR, Estimated: >60 mL/min (ref >60)
Glucose, Bld: 148 mg/dL — ABNORMAL HIGH (ref 70–99)
Potassium: 4.3 mmol/L (ref 3.5–5.1)
Sodium: 135 mmol/L (ref 135–145)
Total Bilirubin: 0.6 mg/dL (ref 0.3–1.2)
Total Protein: 6.6 g/dL (ref 6.5–8.1)

## 2021-07-30 LAB — THYROID ANTIBODIES
Thyroglobulin Antibody: <1 IU/mL (ref 0.0–0.9)
Thyroperoxidase Ab SerPl-aCnc: 287 IU/mL — ABNORMAL HIGH (ref 0–34)

## 2021-07-30 LAB — CBC
HCT: 40.1 % (ref 36.0–46.0)
Hemoglobin: 13.5 g/dL (ref 12.0–15.0)
MCH: 29.7 pg (ref 26.0–34.0)
MCHC: 33.7 g/dL (ref 30.0–36.0)
MCV: 88.1 fL (ref 80.0–100.0)
Platelets: 321 10*3/uL (ref 150–400)
RBC: 4.55 MIL/uL (ref 3.87–5.11)
RDW: 13.2 % (ref 11.5–15.5)
WBC: 10.4 10*3/uL (ref 4.0–10.5)
nRBC: 0 % (ref 0.0–0.2)

## 2021-07-30 LAB — MAGNESIUM: Magnesium: 2.1 mg/dL (ref 1.7–2.4)

## 2021-07-30 NOTE — Progress Notes (Signed)
Physical Therapy Treatment Patient Details Name: Emily Robertson MRN: [id number] DOB: [birth year] Today's Date: 07/30/2021   History of Present Illness 88 yo female  lives alone , admitted 07/25/21 after fall in her home. sustained laceration Left forhead,Found to be in A-fib RVR in the ER which is new. Sustained closed pelvic fracture, bilateral sacral insufficiency fracture and a nondisplaced left superior ramus fracture.  Her history significant for hypertension hyperlipidemia glaucoma and arthritis. Orthopedic consult allowing WBAT.    PT Comments    General Comments: AxO x 3 very pleasant Lady, highly motivated and was Indep prior to her fall (including driving) Assisted OOB to amb to bathroom was difficult.  General bed mobility comments: required increased time due to increased L hip/pelic pain transitioning from supine to EOB. General transfer comment: 25% VC's on safety with turns using walker as assisted to bathroom.  Heavy use B UE's to steady self due to increased L hip/pelvic pain with activity.  Unsteady.  General Gait Details: VERY unsteady gait with decreased WBing tolerance L LE and excessive support on walker B UE's.  Limited amb distance to and from bathroom due to increased pain and increased HR 140's.  Unsteady even with walker.  HIGH FALL RISK. Assisted to recliner and positioned to comfort with multiple pillows.   Pt will need ST Rehab at SNF to address her decline in mobility prior to returning home alone.   Recommendations for follow up therapy are one component of a multi-disciplinary discharge planning process, led by the attending physician.  Recommendations may be updated based on patient status, additional functional criteria and insurance authorization.  Follow Up Recommendations  Skilled nursing-short term rehab (<3 hours/day)     Assistance Recommended at Discharge Frequent or constant Supervision/Assistance  Patient can return home with the following A little  help with walking and/or transfers;A little help with bathing/dressing/bathroom;Help with stairs or ramp for entrance;Assistance with cooking/housework;Assist for transportation   Equipment Recommendations  None recommended by PT    Recommendations for Other Services       Precautions / Restrictions Precautions Precautions: Fall Precaution Comments: New A Fib Restrictions Weight Bearing Restrictions: No     Mobility  Bed Mobility Overal bed mobility: Needs Assistance       Supine to sit: Supervision, Min guard     General bed mobility comments: required increased time due to increased L hip/pelic pain transitioning from supine to EOB.    Transfers Overall transfer level: Needs assistance Equipment used: Rolling walker (2 wheels) Transfers: Sit to/from Stand Sit to Stand: Min assist           General transfer comment: 25% VC's on safety with turns using walker as assisted to bathroom.  Heavy use B UE's to steady self due to increased L hip/pelvic pain with activity.  Unsteady.    Ambulation/Gait Ambulation/Gait assistance: Min assist Gait Distance (Feet): 22 Feet (11 feet x 2 to and from bathroom only) Assistive device: Rolling walker (2 wheels) Gait Pattern/deviations: Step-to pattern, Antalgic, Decreased stance time - left, Narrow base of support Gait velocity: decreased     General Gait Details: VERY unsteady gait with decreased WBing tolerance L LE and excessive support on walker B UE's.  Limited amb distance to and from bathroom due to increased pain and increased HR 140's.  Unsteady even with walker.  HIGH FALL RISK.   Stairs             Wheelchair Mobility    Modified Rankin (Stroke Patients  Only)       Balance                                            Cognition Arousal/Alertness: Awake/alert Behavior During Therapy: WFL for tasks assessed/performed Overall Cognitive Status: Within Functional Limits for tasks  assessed                                 General Comments: AxO x 3 very pleasant Lady, highly motivated and was Indep prior to her fall (including driving)        Exercises      General Comments        Pertinent Vitals/Pain Pain Assessment Pain Assessment: Faces Faces Pain Scale: Hurts little more Pain Location: left  hip/pelvis with WB Pain Descriptors / Indicators: Discomfort, Tender Pain Intervention(s): Monitored during session, Premedicated before session, Repositioned    Home Living                          Prior Function            PT Goals (current goals can now be found in the care plan section) Progress towards PT goals: Progressing toward goals    Frequency    Min 2X/week      PT Plan Current plan remains appropriate    Co-evaluation              AM-PAC PT "6 Clicks" Mobility   Outcome Measure  Help needed turning from your back to your side while in a flat bed without using bedrails?: A Little Help needed moving from lying on your back to sitting on the side of a flat bed without using bedrails?: A Little Help needed moving to and from a bed to a chair (including a wheelchair)?: A Little Help needed standing up from a chair using your arms (e.g., wheelchair or bedside chair)?: A Little Help needed to walk in hospital room?: A Lot Help needed climbing 3-5 steps with a railing? : Total 6 Click Score: 15    End of Session Equipment Utilized During Treatment: Gait belt Activity Tolerance: Patient limited by pain;Treatment limited secondary to medical complications (Comment) (A Fib) Patient left: in chair;with call bell/phone within reach;with chair alarm set;with nursing/sitter in room Nurse Communication: Mobility status PT Visit Diagnosis: Pain;History of falling (Z91.81) Pain - Right/Left: Left Pain - part of body: Hip     Time: 0867-6195 PT Time Calculation (min) (ACUTE ONLY): 26 min  Charges:  $Gait  Training: 8-22 mins $Therapeutic Activity: 8-22 mins                     Felecia Shelling  PTA Acute  Rehabilitation Services Pager      [phone] Office      [phone]

## 2021-07-30 NOTE — Progress Notes (Signed)
PROGRESS NOTE    Emily Robertson  TGG:[id number] DOB: [birth year] DOA: 07/25/2021 PCP: Rogers Blocker, FNP   Brief Narrative:88 yo female  lives alone was walking from kitchen to the den and heat left upper forehead and has sutures on the forehead.   She complains of some shortness of breath denies any chest pain.  She was also found to be in A-fib RVR in the ER which is new for her. She has closed pelvic fracture bilateral sacral insufficiency fracture and a nondisplaced left superior ramus fracture. Her history significant for hypertension hyperlipidemia glaucoma and arthritis.  Assessment & Plan:   Principal Problem:   New onset a-fib [facility]) Active Problems:   HTN (hypertension)   Hyperthyroidism   Osteoporosis   Rheumatoid arthritis (HCC)   Pure hypercholesterolemia   Fall at home, initial encounter   Closed pelvic fracture (HCC)  #1 pelvic fracture status post fall bilateral sacral insufficiency fractures and left-sided pubic Ramey fracture.  Seen by Ortho. PT OT consulted recommending SNF [facility] 6/20 Weightbearing as tolerated. Follow-up with Ortho in 2 to 3 weeks for recheck and repeat x-rays.  #2 new onset A-fib seen by cardiology appreciate their input.  On Cardizem and metoprolol.   Echo ejection fraction 60 to 65%.  Mildly elevated pulmonary artery systolic pressure.  Mild to moderate tricuspid valve regurgitation.  No aortic stenosis. TSH 0.010 Free T4 and T3 elevated K 3.9 mag 2.1  Patient was started on Eliquis after discussion with patient and his son.  #3 essential hypertension continue Cardizem and Toprol Bp 153/79  #4 history of rheumatoid arthritis and osteoporosis on prednisone 2.5 mg daily  #6 hyperlipidemia on Lovaza  #7 glaucoma continue Xalatan eyedrops  #8 seasonal allergies on Claritin and Singulair  #9 hypokalemia and hypomagnesemia repleted potassium 3.9 magnesium 2.1.  labs pending  #10 hypothyroidism with low TSH and elevated T4 and T3  discussed with Dr. Lucianne Muss will obtain thyroid antibodies and started on methimazole 10 mg twice a day.  She will need outpatient follow-up with endocrinology.  Estimated body mass index is 22.31 kg/m as calculated from the following:   Height as of this encounter: 5\' 4"  (1.626 m).   Weight as of this encounter: 59 kg.  DVT prophylaxis: Lovenox Code Status: full Family Communication: none Disposition Plan:  Status is: Inpatient Remains inpatient appropriate because: A-fib RVR   Consultants:  Cardiology and Ortho  Procedures: None Antimicrobials: None  Subjective:  No c/o   Anxious to start therapy Objective: Vitals:   07/30/21 0555 07/30/21 1114 07/30/21 1246 07/30/21 1319  BP: (!) 156/89  (!) 154/102 (!) 153/79  Pulse: 83 (!) 125 98   Resp: 18  20   Temp: 97.9 F (36.6 C)  97.6 F (36.4 C)   TempSrc:   Oral   SpO2: 97%  100%   Weight:      Height:        Intake/Output Summary (Last 24 hours) at 07/30/2021 1419 Last data filed at 07/30/2021 1335 Gross per 24 hour  Intake 480 ml  Output 600 ml  Net -120 ml    Filed Weights   07/26/21 0929  Weight: 59 kg    Examination:  General exam: Appears in mild distress due to pain  respiratory system: Diminished breath sounds at the bases to auscultation. Respiratory effort normal. Cardiovascular system: S1 & S2 heard,irreg No JVD, murmurs, rubs, gallops or clicks. No pedal edema. Gastrointestinal system: Abdomen is nondistended, soft and nontender. No organomegaly or  masses felt. Normal bowel sounds heard. Central nervous system: Alert and oriented. No focal neurological deficits. Extremities: Symmetric 5 x 5 power. Skin: No rashes, lesions or ulcers Psychiatry: Judgement and insight appear normal. Mood & affect appropriate.     Data Reviewed: I have personally reviewed following labs and imaging studies  CBC: Recent Labs  Lab 07/25/21 2040 07/26/21 0923  WBC 12.5* 10.2  NEUTROABS 10.4*  --   HGB 14.2 12.9   HCT 42.3 37.0  MCV 87.6 86.7  PLT 285 270    Basic Metabolic Panel: Recent Labs  Lab 07/25/21 2040 07/26/21 0923 07/27/21 0827  NA 134* 135 136  K 3.6 3.3* 3.9  CL 100 103 103  CO2 23 23 25   GLUCOSE 145* 143* 124*  BUN 16 18 21   CREATININE 0.65 0.51 0.66  CALCIUM 9.4 9.3 9.2  MG  --  1.8 2.1    GFR: Estimated Creatinine Clearance: 41.2 mL/min (by C-G formula based on SCr of 0.66 mg/dL). Liver Function Tests: Recent Labs  Lab 07/25/21 2040  AST 33  ALT 23  ALKPHOS 95  BILITOT 1.0  PROT 7.2  ALBUMIN 4.0    No results for input(s): "LIPASE", "AMYLASE" in the last 168 hours. No results for input(s): "AMMONIA" in the last 168 hours. Coagulation Profile: No results for input(s): "INR", "PROTIME" in the last 168 hours. Cardiac Enzymes: No results for input(s): "CKTOTAL", "CKMB", "CKMBINDEX", "TROPONINI" in the last 168 hours. BNP (last 3 results) No results for input(s): "PROBNP" in the last 8760 hours. HbA1C: No results for input(s): "HGBA1C" in the last 72 hours. CBG: No results for input(s): "GLUCAP" in the last 168 hours. Lipid Profile: No results for input(s): "CHOL", "HDL", "LDLCALC", "TRIG", "CHOLHDL", "LDLDIRECT" in the last 72 hours.  Thyroid Function Tests: No results for input(s): "TSH", "T4TOTAL", "FREET4", "T3FREE", "THYROIDAB" in the last 72 hours.  Anemia Panel: No results for input(s): "VITAMINB12", "FOLATE", "FERRITIN", "TIBC", "IRON", "RETICCTPCT" in the last 72 hours. Sepsis Labs: No results for input(s): "PROCALCITON", "LATICACIDVEN" in the last 168 hours.  No results found for this or any previous visit (from the past 240 hour(s)).       Radiology Studies: No results found.      Scheduled Meds:  acetaminophen  650 mg Oral TID   apixaban  2.5 mg Oral BID   vitamin C  500 mg Oral Daily   atorvastatin  20 mg Oral QHS   cholecalciferol  1,000 Units Oral Daily   diltiazem  180 mg Oral Daily   famotidine  20 mg Oral BID    latanoprost  1 drop Left Eye QHS   loratadine  10 mg Oral Daily   methimazole  10 mg Oral BID   metoprolol succinate  100 mg Oral Daily   montelukast  10 mg Oral QPM   multivitamin with minerals  1 tablet Oral Daily   omega-3 acid ethyl esters  1 g Oral Daily   predniSONE  2.5 mg Oral Q breakfast   timolol  1 drop Both Eyes BID   Continuous Infusions:     LOS: 5 days    Time spent: 39 min  , MD 07/30/2021, 2:19 PM

## 2021-07-30 NOTE — Care Management Important Message (Signed)
Important Message  Patient Details IM Letter given to the Patient. Name: Emily Robertson MRN: [id number] Date of Birth: 1930/05/31   Medicare Important Message Given:  Yes     Caren Macadam 07/30/2021, 1:02 PM

## 2021-07-30 NOTE — TOC Progression Note (Addendum)
Transition of Care [facility]) - Progression Note    Patient Details  Name: Emily Robertson MRN: [id number] Date of Birth: 12-22-1930  Transition [facility]) CM/SW Contact  Golda Acre, RN Phone Number: 07/30/2021, 8:46 AM  Clinical Narrative:    [facility].  Choice is for [facility].  [facility].  Not sure if able to take today will be able tomorrow.  Will do bed report and call back with bed disposition. Approval for [facility] in [place] obtained from [place]. Tct-tracey hardin to see if patient can come today. At 1354 Expected Discharge Plan: Home/Self Care Barriers to Discharge: Continued Medical Work up  Expected Discharge Plan and Services Expected Discharge Plan: Home/Self Care   Discharge Planning Services: CM Consult   Living arrangements for the past 2 months: Single Family Home                                       Social Determinants of Health (SDOH) Interventions    Readmission Risk Interventions     No data to display

## 2021-07-31 DIAGNOSIS — Z7401 Bed confinement status: Secondary | ICD-10-CM | POA: Diagnosis not present

## 2021-07-31 DIAGNOSIS — Z79899 Other long term (current) drug therapy: Secondary | ICD-10-CM | POA: Diagnosis not present

## 2021-07-31 DIAGNOSIS — S32810D Multiple fractures of pelvis with stable disruption of pelvic ring, subsequent encounter for fracture with routine healing: Secondary | ICD-10-CM | POA: Diagnosis not present

## 2021-07-31 DIAGNOSIS — I340 Nonrheumatic mitral (valve) insufficiency: Secondary | ICD-10-CM | POA: Diagnosis not present

## 2021-07-31 DIAGNOSIS — E0590 Thyrotoxicosis, unspecified without thyrotoxic crisis or storm: Secondary | ICD-10-CM | POA: Diagnosis not present

## 2021-07-31 DIAGNOSIS — S329XXA Fracture of unspecified parts of lumbosacral spine and pelvis, initial encounter for closed fracture: Secondary | ICD-10-CM | POA: Diagnosis not present

## 2021-07-31 DIAGNOSIS — M25552 Pain in left hip: Secondary | ICD-10-CM | POA: Diagnosis not present

## 2021-07-31 DIAGNOSIS — I10 Essential (primary) hypertension: Secondary | ICD-10-CM | POA: Diagnosis not present

## 2021-07-31 DIAGNOSIS — I4891 Unspecified atrial fibrillation: Secondary | ICD-10-CM | POA: Diagnosis not present

## 2021-07-31 DIAGNOSIS — M2550 Pain in unspecified joint: Secondary | ICD-10-CM | POA: Diagnosis not present

## 2021-07-31 MED ORDER — DILTIAZEM HCL ER COATED BEADS 180 MG PO CP24: 180.0000 mg | ORAL_CAPSULE | Freq: Every day | ORAL | 4 refills | Status: DC

## 2021-07-31 MED ORDER — TRAMADOL HCL 50 MG PO TABS: ORAL_TABLET | ORAL | 0 refills | Status: DC

## 2021-07-31 MED ORDER — ATORVASTATIN CALCIUM 20 MG PO TABS: 20.0000 mg | ORAL_TABLET | Freq: Every day | ORAL | 4 refills | Status: DC

## 2021-07-31 MED ORDER — ACETAMINOPHEN 325 MG PO TABS: 650.0000 mg | ORAL_TABLET | Freq: Three times a day (TID) | ORAL | Status: DC

## 2021-07-31 MED ORDER — PREDNISONE 2.5 MG PO TABS: 2.5000 mg | ORAL_TABLET | Freq: Every day | ORAL | 1 refills | Status: DC

## 2021-07-31 MED ORDER — APIXABAN 2.5 MG PO TABS: 2.5000 mg | ORAL_TABLET | Freq: Two times a day (BID) | ORAL | 4 refills | Status: DC

## 2021-07-31 MED ORDER — OMEGA-3-ACID ETHYL ESTERS 1 G PO CAPS: 1.0000 g | ORAL_CAPSULE | Freq: Every day | ORAL | 4 refills | Status: DC

## 2021-07-31 MED ORDER — METHIMAZOLE 10 MG PO TABS: 10.0000 mg | ORAL_TABLET | Freq: Two times a day (BID) | ORAL | 4 refills | Status: DC

## 2021-07-31 NOTE — TOC Transition Note (Signed)
Transition of Care [facility]) - CM/SW Discharge Note   Patient Details  Name: ANNALIE WENNER MRN: [id number] Date of Birth: 1930-06-24  Transition of Care [facility]) CM/SW Contact:  Golda Acre, RN Phone Number: 07/31/2021, 11:38 AM   Clinical Narrative: Patient going to [facility] [place].  Call report to [phone].  [facility]tar called at [phone].     Barriers to Discharge: Continued Medical Work up   Patient Goals and CMS Choice Patient states their goals for this hospitalization and ongoing recovery are:: to go back home CMS Medicare.gov Compare Post Acute Care list provided to:: Patient Choice offered to / list presented to : Patient  Discharge Placement                       Discharge Plan and Services   Discharge Planning Services: CM Consult                                 Social Determinants of Health (SDOH) Interventions     Readmission Risk Interventions     No data to display

## 2021-07-31 NOTE — Discharge Summary (Signed)
Physician Discharge Summary  JOLEENA PEZZA [id number] DOB: [birth year] DOA: 07/25/2021  PCP: [name], FNP  Admit date: 07/25/2021 Discharge date: 07/31/2021  Admitted From: home Disposition:  nh  Recommendations for Outpatient Follow-up:  Follow up with PCP in 1-2 weeks Please obtain BMP/CBC in one week Please follow up with endocrinology dr Dwyane Dee in 3 weeks  [place] no Equipment/Devices:none Discharge Condition stable CODE STATUS: full Diet recommendation:cardiac  Brief/Interim Summary:  88 yo female  lives alone was walking from kitchen to the den and heat left upper forehead and has sutures on the forehead.   She complains of some shortness of breath denies any chest pain.  She was also found to be in A-fib RVR in the ER which is new for her. She has closed pelvic fracture bilateral sacral insufficiency fracture and a nondisplaced left superior ramus fracture. Her history significant for hypertension hyperlipidemia glaucoma and arthritis.   Discharge Diagnoses:  Principal Problem:   New onset a-fib [facility]) Active Problems:   HTN (hypertension)   Hyperthyroidism   Osteoporosis   Rheumatoid arthritis ([place])   Pure hypercholesterolemia   Fall at home, initial encounter   Closed pelvic fracture ([place])    #1 pelvic fracture status post fall bilateral sacral insufficiency fractures and left-sided pubic Ramey fracture.  Seen by Ortho. PT OT consulted recommending SNF Weightbearing as tolerated. Follow-up with Ortho in 2 to 3 weeks for recheck and repeat x-rays. Laceration on the left forehead with sutures.  Sutures removed 07/31/2021.   #2 new onset A-fib seen by cardiology .  On Cardizem and metoprolol.  Echo ejection fraction 60 to 65%.  Mildly elevated pulmonary artery systolic pressure.  Mild to moderate tricuspid valve regurgitation.  No aortic stenosis. TSH 0.010 Free T4 and T3 elevated K 3.9 mag 2.1  Patient was started on Eliquis after discussion with  patient and his son.   #3 essential hypertension continue Cardizem and Toprol Bp 153/79   #4 history of rheumatoid arthritis and osteoporosis on prednisone 2.5 mg daily   #6 hyperlipidemia on Lovaza   #7 glaucoma continue Xalatan eyedrops   #8 seasonal allergies on Claritin and Singulair   #9 hypokalemia and hypomagnesemia repleted potassium 3.9 magnesium 2.1.    #10 hypothyroidism with low TSH and elevated T4 and T3 Thyroglobulin ab less than 1 Tpo ab 287 Follow up with dr Dwyane Dee in 3 weeks    Estimated body mass index is 22.31 kg/m as calculated from the following:   Height as of this encounter: 5\' 4"  (1.626 m).   Weight as of this encounter: 59 kg.  Discharge Instructions  Discharge Instructions     Diet - low sodium heart healthy   Complete by: As directed    Increase activity slowly   Complete by: As directed    No wound care   Complete by: As directed       Allergies as of 07/31/2021       Reactions   Cozaar [losartan]    Tongue swelling   Azopt [brinzolamide]    Rash on eyelid   Erythromycin Itching        Medication List     STOP taking these medications    diphenhydrAMINE 25 MG tablet Commonly known as: BENADRYL   fish oil-omega-3 fatty acids 1000 MG capsule   lisinopril 10 MG tablet Commonly known as: ZESTRIL       TAKE these medications    acetaminophen 325 MG tablet Commonly known as: TYLENOL Take  2 tablets (650 mg total) by mouth 3 (three) times daily. What changed:  medication strength how much to take when to take this reasons to take this   alendronate 70 MG tablet Commonly known as: FOSAMAX Take 1 tablet by mouth once a week. wednesday   apixaban 2.5 MG Tabs tablet Commonly known as: ELIQUIS Take 1 tablet (2.5 mg total) by mouth 2 (two) times daily.   atorvastatin 20 MG tablet Commonly known as: LIPITOR Take 1 tablet (20 mg total) by mouth at bedtime.   CALCIUM 500 +D PO Take 1 tablet by mouth daily.    cetirizine 10 MG tablet Commonly known as: ZYRTEC Take 1 tablet (10 mg total) by mouth 2 (two) times daily.   diltiazem 180 MG 24 hr capsule Commonly known as: CARDIZEM CD Take 1 capsule (180 mg total) by mouth daily. Start taking on: August 01, 2021   EPINEPHrine 0.3 mg/0.3 mL Soaj injection Commonly known as: EpiPen Inject 0.3 mLs (0.3 mg total) into the muscle once. What changed:  when to take this reasons to take this   famotidine 20 MG tablet Commonly known as: PEPCID Take 1 tablet (20 mg total) by mouth 2 (two) times daily.   latanoprost 0.005 % ophthalmic solution Commonly known as: XALATAN Place 1 drop into the left eye at bedtime.   leflunomide 10 MG tablet Commonly known as: ARAVA Take 10 mg by mouth daily.   LYSINE PO Take 1 capsule by mouth daily.   methimazole 10 MG tablet Commonly known as: TAPAZOLE Take 1 tablet (10 mg total) by mouth 2 (two) times daily.   metoprolol succinate 100 MG 24 hr tablet Commonly known as: TOPROL-XL Take 100 mg by mouth daily.   montelukast 10 MG tablet Commonly known as: SINGULAIR Take 1 tablet (10 mg total) by mouth daily. What changed: when to take this   multivitamin tablet Take 1 tablet by mouth daily.   omega-3 acid ethyl esters 1 g capsule Commonly known as: LOVAZA Take 1 capsule (1 g total) by mouth daily. Start taking on: August 01, 2021   OSTEO BI-FLEX JOINT SHIELD PO Take 1 capsule by mouth daily.   predniSONE 2.5 MG tablet Commonly known as: DELTASONE Take 1 tablet (2.5 mg total) by mouth daily with breakfast. Start taking on: August 01, 2021 What changed:  medication strength how much to take how to take this when to take this additional instructions   PROBIOTIC DAILY PO Take 1 capsule by mouth daily.   timolol 0.5 % ophthalmic solution Commonly known as: TIMOPTIC Place 1 drop into both eyes 2 (two) times daily.   traMADol 50 MG tablet Commonly known as: ULTRAM 1-2 tablets every 6 hours as  needed for pain. May take with acetaminophen.   vitamin C 500 MG tablet Commonly known as: ASCORBIC ACID Take 500 mg by mouth daily.   Vitamin D3 25 MCG (1000 UT) Caps Take 1 capsule by mouth daily.        Follow-up Information     [name], FNP Follow up.   Specialty: Family Medicine Contact information: [place] [address] [phone]         Lelon Perla, MD .   Specialty: Cardiology Contact information: [address] [place] [address] [phone]         Elayne Snare, MD Follow up.   Specialty: Endocrinology Why: Please follow-up with him in 3 weeks Contact information: [address]  [place] [address] [phone]                Allergies  Allergen Reactions   Cozaar [Losartan]     Tongue swelling   Azopt [Brinzolamide]     Rash on eyelid   Erythromycin Itching    Consultations: Ortho cardiology   Procedures/Studies: ECHOCARDIOGRAM COMPLETE  Result Date: 07/26/2021    ECHOCARDIOGRAM REPORT   Patient Name:   Emily Robertson Date of Exam: 07/26/2021 Medical Rec #:  [id number]         Height:       62.0 in Accession #:    [id number]        Weight:       123.4 lb Date of Birth:  18-Jan-1931         BSA:          1.557 m Patient Age:    89 years          BP:           136/83 mmHg Patient Gender: F                 HR:           81 bpm. Exam Location:  Inpatient Procedure: 2D Echo, Cardiac Doppler and Color Doppler Indications:    Atrial fibrillation  History:        Patient has no prior history of Echocardiogram examinations.                 Arrythmias:Atrial Fibrillation; Risk Factors:Hypertension.  Sonographer:    Eduard Roux Referring Phys: [id number] CALLIE E GOODRICH IMPRESSIONS  1. Left ventricular ejection fraction, by estimation, is 60 to 65%. The left ventricle has normal function. The left ventricle has no regional wall motion abnormalities. Left ventricular diastolic function  could not be evaluated.  2. Right ventricular systolic function is normal. The right ventricular size is normal. There is mildly elevated pulmonary artery systolic pressure.  3. Left atrial size was severely dilated.  4. Right atrial size was moderately dilated.  5. The mitral valve is normal in structure. Moderate to severe mitral valve regurgitation. No evidence of mitral stenosis.  6. Tricuspid valve regurgitation is mild to moderate.  7. The aortic valve is tricuspid. Aortic valve regurgitation is mild. No aortic stenosis is present.  8. The inferior vena cava is normal in size with greater than 50% respiratory variability, suggesting right atrial pressure of 3 mmHg. FINDINGS  Left Ventricle: Left ventricular ejection fraction, by estimation, is 60 to 65%. The left ventricle has normal function. The left ventricle has no regional wall motion abnormalities. The left ventricular internal cavity size was normal in size. There is  no left ventricular hypertrophy. Left ventricular diastolic function could not be evaluated due to atrial fibrillation. Left ventricular diastolic function could not be evaluated. Right Ventricle: The right ventricular size is normal. No increase in right ventricular wall thickness. Right ventricular systolic function is normal. There is mildly elevated pulmonary artery systolic pressure. The tricuspid regurgitant velocity is 2.91  m/s, and with an assumed right atrial pressure of 5 mmHg, the estimated right ventricular systolic pressure is 38.9 mmHg. Left Atrium: Left atrial size was severely dilated. Right Atrium: Right atrial size was moderately dilated. Pericardium: There is no evidence of pericardial effusion. Mitral Valve: The mitral valve is normal in structure. Mild to moderate mitral annular calcification. Moderate to severe mitral valve regurgitation, with eccentric anteriorly directed jet. No evidence of mitral valve  stenosis. Tricuspid Valve: The tricuspid valve is normal in  structure. Tricuspid valve regurgitation is mild to moderate. No evidence of tricuspid stenosis. Aortic Valve: The aortic valve is tricuspid. Aortic valve regurgitation is mild. Aortic regurgitation PHT measures 556 msec. No aortic stenosis is present. Aortic valve peak gradient measures 5.0 mmHg. Pulmonic Valve: The pulmonic valve was normal in structure. Pulmonic valve regurgitation is not visualized. No evidence of pulmonic stenosis. Aorta: The aortic root is normal in size and structure. Venous: The inferior vena cava is normal in size with greater than 50% respiratory variability, suggesting right atrial pressure of 3 mmHg. IAS/Shunts: No atrial level shunt detected by color flow Doppler.  LEFT VENTRICLE PLAX 2D LVIDd:         4.50 cm LVIDs:         3.00 cm LV PW:         1.20 cm LV IVS:        1.10 cm LVOT diam:     2.00 cm LVOT Area:     3.14 cm  RIGHT VENTRICLE          IVC RV Basal diam:  3.10 cm  IVC diam: 1.30 cm LEFT ATRIUM              Index        RIGHT ATRIUM           Index LA diam:        3.30 cm  2.12 cm/m   RA Area:     19.50 cm LA Vol (A2C):   111.0 ml 71.29 ml/m  RA Volume:   55.40 ml  35.58 ml/m LA Vol (A4C):   98.5 ml  63.26 ml/m LA Biplane Vol: 106.0 ml 68.08 ml/m  AORTIC VALVE              PULMONIC VALVE AV Vmax:      111.80 cm/s PV Vmax:       0.66 m/s AV Peak Grad: 5.0 mmHg    PV Peak grad:  1.7 mmHg AI PHT:       556 msec  AORTA Ao Root diam: 3.30 cm Ao Asc diam:  3.40 cm MITRAL VALVE                TRICUSPID VALVE MV Area (PHT): 4.23 cm     TR Peak grad:   33.9 mmHg MV Decel Time: 179 msec     TR Vmax:        291.00 cm/s MR Peak grad: 136.0 mmHg MR Vmax:      583.00 cm/s   SHUNTS MV E velocity: 114.33 cm/s  Systemic Diam: 2.00 cm Kardie Tobb DO Electronically signed by Thomasene Ripple DO Signature Date/Time: 07/26/2021/6:27:55 PM    Final    CT PELVIS WO CONTRAST  Result Date: 07/25/2021 CLINICAL DATA:  Hip trauma, fracture suspected, xray done. Fall, left hip injury EXAM: CT  PELVIS WITHOUT CONTRAST TECHNIQUE: Multidetector CT imaging of the pelvis was performed following the standard protocol without intravenous contrast. RADIATION DOSE REDUCTION: This exam was performed according to the departmental dose-optimization program which includes automated exposure control, adjustment of the mA and/or kV according to patient size and/or use of iterative reconstruction technique. COMPARISON:  None Available. FINDINGS: Urinary Tract: The visualized inferior pole right kidney is unremarkable. No hydroureter. The bladder is unremarkable. Bowel: Severe sigmoid diverticulosis without superimposed acute inflammatory change. The visualized large and small bowel are otherwise unremarkable. Appendix normal. Vascular/Lymphatic: Mild aortoiliac atherosclerotic calcification.  No aortic aneurysm. No pathologic adenopathy within the pelvis. Reproductive:  Uterus unremarkable.  No adnexal masses. Other:  None Musculoskeletal: There are insufficiency fractures of the sacral ala bilaterally. There is an acute impacted fracture of the left superior pubic ramus. Subtle angular deformity of the inferior pubic ramus (axial image # 110, series 2) suggests a plastic fracture of the structure with fracture fragments in near anatomic alignment. The osseous structures are diffusely osteopenic. Degenerative changes are seen within the lumbar spine and hips bilaterally. No dislocation. IMPRESSION: 1. Acute impacted fracture of the left superior pubic ramus. 2. Acute plastic fracture of the left inferior pubic ramus with fracture fragments in near anatomic alignment. 3. Bilateral sacral alar insufficiency fractures. 4. Diffuse osteopenia. 5. Severe sigmoid diverticulosis without superimposed acute inflammatory change. Electronically Signed   By: Fidela Salisbury M.D.   On: 07/25/2021 20:22   CT Cervical Spine Wo Contrast  Result Date: 07/25/2021 CLINICAL DATA:  Trauma. EXAM: CT HEAD WITHOUT CONTRAST CT CERVICAL SPINE  WITHOUT CONTRAST TECHNIQUE: Multidetector CT imaging of the head and cervical spine was performed following the standard protocol without intravenous contrast. Multiplanar CT image reconstructions of the cervical spine were also generated. RADIATION DOSE REDUCTION: This exam was performed according to the departmental dose-optimization program which includes automated exposure control, adjustment of the mA and/or kV according to patient size and/or use of iterative reconstruction technique. COMPARISON:  None Available. FINDINGS: CT HEAD FINDINGS Brain: There is mild age-related atrophy and moderate chronic microvascular ischemic changes. Old left basal ganglia lacunar infarct. There is no acute intracranial hemorrhage. No mass effect or midline shift. No extra-axial fluid collection. Vascular: No hyperdense vessel or unexpected calcification. Skull: Normal. Negative for fracture or focal lesion. Sinuses/Orbits: The visualized paranasal sinuses and mastoid air cells are clear. Right globe scleral banding. Other: None CT CERVICAL SPINE FINDINGS Alignment: No acute subluxation. Skull base and vertebrae: No acute fracture. Soft tissues and spinal canal: No prevertebral fluid or swelling. No visible canal hematoma. Disc levels:  No acute findings.  Multilevel degenerative changes. Upper chest: Biapical subpleural scarring. Other: None IMPRESSION: 1. No acute intracranial pathology. 2. Age-related atrophy and chronic microvascular ischemic changes. Old left basal ganglia lacunar infarct. 3. No acute/traumatic cervical spine pathology. Electronically Signed   By: Anner Crete M.D.   On: 07/25/2021 20:17   CT Head Wo Contrast  Result Date: 07/25/2021 CLINICAL DATA:  Trauma. EXAM: CT HEAD WITHOUT CONTRAST CT CERVICAL SPINE WITHOUT CONTRAST TECHNIQUE: Multidetector CT imaging of the head and cervical spine was performed following the standard protocol without intravenous contrast. Multiplanar CT image reconstructions  of the cervical spine were also generated. RADIATION DOSE REDUCTION: This exam was performed according to the departmental dose-optimization program which includes automated exposure control, adjustment of the mA and/or kV according to patient size and/or use of iterative reconstruction technique. COMPARISON:  None Available. FINDINGS: CT HEAD FINDINGS Brain: There is mild age-related atrophy and moderate chronic microvascular ischemic changes. Old left basal ganglia lacunar infarct. There is no acute intracranial hemorrhage. No mass effect or midline shift. No extra-axial fluid collection. Vascular: No hyperdense vessel or unexpected calcification. Skull: Normal. Negative for fracture or focal lesion. Sinuses/Orbits: The visualized paranasal sinuses and mastoid air cells are clear. Right globe scleral banding. Other: None CT CERVICAL SPINE FINDINGS Alignment: No acute subluxation. Skull base and vertebrae: No acute fracture. Soft tissues and spinal canal: No prevertebral fluid or swelling. No visible canal hematoma. Disc levels:  No acute findings.  Multilevel degenerative  changes. Upper chest: Biapical subpleural scarring. Other: None IMPRESSION: 1. No acute intracranial pathology. 2. Age-related atrophy and chronic microvascular ischemic changes. Old left basal ganglia lacunar infarct. 3. No acute/traumatic cervical spine pathology. Electronically Signed   By: Anner Crete M.D.   On: 07/25/2021 20:17   DG Hip Unilat W or Wo Pelvis 2-3 Views Left  Result Date: 07/25/2021 CLINICAL DATA:  Pain after a fall EXAM: DG HIP (WITH OR WITHOUT PELVIS) 2-3V LEFT COMPARISON:  None Available. FINDINGS: AP view of the pelvis and AP/frog leg views of the left hip. Femoral heads are located. Sacroiliac joints are symmetric. Osteopenia. Degenerate disc disease at L4-5. Subtle osseous irregularity in the left parasymphyseal pubic bone IMPRESSION: Suspect a left parasymphyseal pubic bone fracture, minimally displaced.  Correlate with point tenderness. CT could confirm. Electronically Signed   By: Abigail Miyamoto M.D.   On: 07/25/2021 17:51   (Echo, Carotid, EGD, Colonoscopy, ERCP)    Subjective:  No complaints anxious to be going to rehab Discharge Exam: Vitals:   07/31/21 0549 07/31/21 0845  BP: (!) 142/80 (!) 163/95  Pulse: 96 99  Resp: 17   Temp: 97.8 F (36.6 C)   SpO2: 98%    Vitals:   07/30/21 1319 07/30/21 2217 07/31/21 0549 07/31/21 0845  BP: (!) 153/79 (!) 144/85 (!) 142/80 (!) 163/95  Pulse:  87 96 99  Resp:  17 17   Temp:  97.7 F (36.5 C) 97.8 F (36.6 C)   TempSrc:  Oral Oral   SpO2:  97% 98%   Weight:      Height:        General: Pt is alert, awake, not in acute distress Cardiovascular: Irregular S1/S2 +, no rubs, no gallops Respiratory: CTA bilaterally, no wheezing, no rhonchi Abdominal: Soft, NT, ND, bowel sounds + Extremities: no edema, no cyanosis    The results of significant diagnostics from this hospitalization (including imaging, microbiology, ancillary and laboratory) are listed below for reference.     Microbiology: No results found for this or any previous visit (from the past 240 hour(s)).   Labs: BNP (last 3 results) No results for input(s): "BNP" in the last 8760 hours. Basic Metabolic Panel: Recent Labs  Lab 07/25/21 2040 07/26/21 0923 07/27/21 0827 07/30/21 1503  NA 134* 135 136 135  K 3.6 3.3* 3.9 4.3  CL 100 103 103 102  CO2 23 23 25 24   GLUCOSE 145* 143* 124* 148*  BUN 16 18 21 22   CREATININE 0.65 0.51 0.66 0.61  CALCIUM 9.4 9.3 9.2 9.3  MG  --  1.8 2.1 2.1   Liver Function Tests: Recent Labs  Lab 07/25/21 2040 07/30/21 1503  AST 33 38  ALT 23 40  ALKPHOS 95 73  BILITOT 1.0 0.6  PROT 7.2 6.6  ALBUMIN 4.0 3.4*   No results for input(s): "LIPASE", "AMYLASE" in the last 168 hours. No results for input(s): "AMMONIA" in the last 168 hours. CBC: Recent Labs  Lab 07/25/21 2040 07/26/21 0923 07/30/21 1503  WBC 12.5* 10.2 10.4   NEUTROABS 10.4*  --   --   HGB 14.2 12.9 13.5  HCT 42.3 37.0 40.1  MCV 87.6 86.7 88.1  PLT 285 270 321   Cardiac Enzymes: No results for input(s): "CKTOTAL", "CKMB", "CKMBINDEX", "TROPONINI" in the last 168 hours. BNP: Invalid input(s): "POCBNP" CBG: No results for input(s): "GLUCAP" in the last 168 hours. D-Dimer No results for input(s): "DDIMER" in the last 72 hours. Hgb A1c No results for  input(s): "HGBA1C" in the last 72 hours. Lipid Profile No results for input(s): "CHOL", "HDL", "LDLCALC", "TRIG", "CHOLHDL", "LDLDIRECT" in the last 72 hours. Thyroid function studies No results for input(s): "TSH", "T4TOTAL", "T3FREE", "THYROIDAB" in the last 72 hours.  Invalid input(s): "FREET3" Anemia work up No results for input(s): "VITAMINB12", "FOLATE", "FERRITIN", "TIBC", "IRON", "RETICCTPCT" in the last 72 hours. Urinalysis    Component Value Date/Time   COLORURINE YELLOW 07/25/2021 1939   APPEARANCEUR CLEAR 07/25/2021 1939   LABSPEC 1.012 07/25/2021 1939   PHURINE 6.0 07/25/2021 1939   GLUCOSEU NEGATIVE 07/25/2021 1939   HGBUR NEGATIVE 07/25/2021 1939   BILIRUBINUR NEGATIVE 07/25/2021 1939   KETONESUR 5 (A) 07/25/2021 1939   PROTEINUR 30 (A) 07/25/2021 1939   NITRITE NEGATIVE 07/25/2021 1939   LEUKOCYTESUR NEGATIVE 07/25/2021 1939   Sepsis Labs Recent Labs  Lab 07/25/21 2040 07/26/21 0923 07/30/21 1503  WBC 12.5* 10.2 10.4   Microbiology No results found for this or any previous visit (from the past 240 hour(s)).   Time coordinating discharge 39 minutes  SIGNED:   Georgette Shell, MD  [facility] 07/31/2021, 12:26 PM

## 2021-07-31 NOTE — Plan of Care (Signed)
Patient being discharged to [place] at [facility] PIV and tele removed per orders. Report called to [place] at 1200. Patient transported via [place]. Family aware of transfer.   Problem: Education: Goal: Knowledge of General Education information will improve Description: Including pain rating scale, medication(s)/side effects and non-pharmacologic comfort measures Outcome: Completed/Met   Problem: Health Behavior/Discharge Planning: Goal: Ability to manage health-related needs will improve Outcome: Completed/Met   Problem: Clinical Measurements: Goal: Ability to maintain clinical measurements within normal limits will improve Outcome: Completed/Met Goal: Will remain free from infection Outcome: Completed/Met Goal: Diagnostic test results will improve Outcome: Completed/Met Goal: Respiratory complications will improve Outcome: Completed/Met Goal: Cardiovascular complication will be avoided Outcome: Completed/Met   Problem: Activity: Goal: Risk for activity intolerance will decrease Outcome: Completed/Met   Problem: Nutrition: Goal: Adequate nutrition will be maintained Outcome: Completed/Met   Problem: Coping: Goal: Level of anxiety will decrease Outcome: Completed/Met   Problem: Elimination: Goal: Will not experience complications related to bowel motility Outcome: Completed/Met Goal: Will not experience complications related to urinary retention Outcome: Completed/Met   Problem: Pain Managment: Goal: General experience of comfort will improve Outcome: Completed/Met   Problem: Safety: Goal: Ability to remain free from injury will improve Outcome: Completed/Met   Problem: Skin Integrity: Goal: Risk for impaired skin integrity will decrease Outcome: Completed/Met   Problem: Education: Goal: Knowledge of General Education information will improve Description: Including pain rating scale, medication(s)/side effects and non-pharmacologic comfort  measures Outcome: Completed/Met   Problem: Health Behavior/Discharge Planning: Goal: Ability to manage health-related needs will improve Outcome: Completed/Met   Problem: Clinical Measurements: Goal: Ability to maintain clinical measurements within normal limits will improve Outcome: Completed/Met Goal: Will remain free from infection Outcome: Completed/Met Goal: Diagnostic test results will improve Outcome: Completed/Met Goal: Respiratory complications will improve Outcome: Completed/Met Goal: Cardiovascular complication will be avoided Outcome: Completed/Met   Problem: Activity: Goal: Risk for activity intolerance will decrease Outcome: Completed/Met   Problem: Nutrition: Goal: Adequate nutrition will be maintained Outcome: Completed/Met   Problem: Coping: Goal: Level of anxiety will decrease Outcome: Completed/Met   Problem: Elimination: Goal: Will not experience complications related to bowel motility Outcome: Completed/Met Goal: Will not experience complications related to urinary retention Outcome: Completed/Met   Problem: Pain Managment: Goal: General experience of comfort will improve Outcome: Completed/Met   Problem: Safety: Goal: Ability to remain free from injury will improve Outcome: Completed/Met   Problem: Skin Integrity: Goal: Risk for impaired skin integrity will decrease Outcome: Completed/Met

## 2021-08-08 DIAGNOSIS — M25552 Pain in left hip: Secondary | ICD-10-CM | POA: Diagnosis not present

## 2021-08-22 DIAGNOSIS — M0609 Rheumatoid arthritis without rheumatoid factor, multiple sites: Secondary | ICD-10-CM | POA: Diagnosis not present

## 2021-08-22 DIAGNOSIS — I10 Essential (primary) hypertension: Secondary | ICD-10-CM | POA: Diagnosis not present

## 2021-08-22 DIAGNOSIS — S3282XD Multiple fractures of pelvis without disruption of pelvic ring, subsequent encounter for fracture with routine healing: Secondary | ICD-10-CM | POA: Diagnosis not present

## 2021-08-22 DIAGNOSIS — E0590 Thyrotoxicosis, unspecified without thyrotoxic crisis or storm: Secondary | ICD-10-CM | POA: Diagnosis not present

## 2021-08-22 DIAGNOSIS — I4891 Unspecified atrial fibrillation: Secondary | ICD-10-CM | POA: Diagnosis not present

## 2021-08-24 DIAGNOSIS — H40029 Open angle with borderline findings, high risk, unspecified eye: Secondary | ICD-10-CM | POA: Diagnosis not present

## 2021-09-03 ENCOUNTER — Emergency Department (HOSPITAL_COMMUNITY): Payer: Medicare PPO

## 2021-09-03 ENCOUNTER — Other Ambulatory Visit: Payer: Self-pay

## 2021-09-03 ENCOUNTER — Inpatient Hospital Stay (HOSPITAL_COMMUNITY): Payer: Medicare PPO

## 2021-09-03 ENCOUNTER — Encounter (HOSPITAL_COMMUNITY): Payer: Self-pay | Admitting: Emergency Medicine

## 2021-09-03 ENCOUNTER — Inpatient Hospital Stay (HOSPITAL_COMMUNITY)
Admission: EM | Admit: 2021-09-03 | Discharge: 2021-09-06 | DRG: 291 | Disposition: A | Discharge status: Home Health | Payer: Medicare PPO | Attending: Internal Medicine | Admitting: Internal Medicine

## 2021-09-03 DIAGNOSIS — E785 Hyperlipidemia, unspecified: Secondary | ICD-10-CM | POA: Diagnosis present

## 2021-09-03 DIAGNOSIS — R739 Hyperglycemia, unspecified: Secondary | ICD-10-CM | POA: Diagnosis present

## 2021-09-03 DIAGNOSIS — Z79899 Other long term (current) drug therapy: Secondary | ICD-10-CM | POA: Diagnosis not present

## 2021-09-03 DIAGNOSIS — D72829 Elevated white blood cell count, unspecified: Secondary | ICD-10-CM

## 2021-09-03 DIAGNOSIS — J9811 Atelectasis: Secondary | ICD-10-CM | POA: Diagnosis not present

## 2021-09-03 DIAGNOSIS — Z8249 Family history of ischemic heart disease and other diseases of the circulatory system: Secondary | ICD-10-CM

## 2021-09-03 DIAGNOSIS — I10 Essential (primary) hypertension: Secondary | ICD-10-CM | POA: Diagnosis present

## 2021-09-03 DIAGNOSIS — I2510 Atherosclerotic heart disease of native coronary artery without angina pectoris: Secondary | ICD-10-CM | POA: Diagnosis present

## 2021-09-03 DIAGNOSIS — R911 Solitary pulmonary nodule: Secondary | ICD-10-CM

## 2021-09-03 DIAGNOSIS — I5031 Acute diastolic (congestive) heart failure: Secondary | ICD-10-CM | POA: Diagnosis not present

## 2021-09-03 DIAGNOSIS — Z7952 Long term (current) use of systemic steroids: Secondary | ICD-10-CM | POA: Diagnosis not present

## 2021-09-03 DIAGNOSIS — Z7983 Long term (current) use of bisphosphonates: Secondary | ICD-10-CM | POA: Diagnosis not present

## 2021-09-03 DIAGNOSIS — I4891 Unspecified atrial fibrillation: Principal | ICD-10-CM | POA: Insufficient documentation

## 2021-09-03 DIAGNOSIS — I5033 Acute on chronic diastolic (congestive) heart failure: Secondary | ICD-10-CM | POA: Diagnosis not present

## 2021-09-03 DIAGNOSIS — R0602 Shortness of breath: Secondary | ICD-10-CM | POA: Diagnosis not present

## 2021-09-03 DIAGNOSIS — M069 Rheumatoid arthritis, unspecified: Secondary | ICD-10-CM | POA: Diagnosis present

## 2021-09-03 DIAGNOSIS — E871 Hypo-osmolality and hyponatremia: Secondary | ICD-10-CM

## 2021-09-03 DIAGNOSIS — I110 Hypertensive heart disease with heart failure: Principal | ICD-10-CM | POA: Diagnosis present

## 2021-09-03 DIAGNOSIS — R7989 Other specified abnormal findings of blood chemistry: Secondary | ICD-10-CM | POA: Diagnosis not present

## 2021-09-03 DIAGNOSIS — Z681 Body mass index (BMI) 19 or less, adult: Secondary | ICD-10-CM | POA: Diagnosis not present

## 2021-09-03 DIAGNOSIS — I340 Nonrheumatic mitral (valve) insufficiency: Secondary | ICD-10-CM

## 2021-09-03 DIAGNOSIS — E43 Unspecified severe protein-calorie malnutrition: Secondary | ICD-10-CM | POA: Diagnosis not present

## 2021-09-03 DIAGNOSIS — I4819 Other persistent atrial fibrillation: Secondary | ICD-10-CM | POA: Diagnosis not present

## 2021-09-03 DIAGNOSIS — M810 Age-related osteoporosis without current pathological fracture: Secondary | ICD-10-CM | POA: Diagnosis present

## 2021-09-03 DIAGNOSIS — R918 Other nonspecific abnormal finding of lung field: Secondary | ICD-10-CM | POA: Diagnosis not present

## 2021-09-03 DIAGNOSIS — I509 Heart failure, unspecified: Secondary | ICD-10-CM | POA: Insufficient documentation

## 2021-09-03 DIAGNOSIS — I248 Other forms of acute ischemic heart disease: Secondary | ICD-10-CM | POA: Diagnosis not present

## 2021-09-03 DIAGNOSIS — E0590 Thyrotoxicosis, unspecified without thyrotoxic crisis or storm: Secondary | ICD-10-CM | POA: Diagnosis not present

## 2021-09-03 DIAGNOSIS — R54 Age-related physical debility: Secondary | ICD-10-CM | POA: Diagnosis present

## 2021-09-03 DIAGNOSIS — E222 Syndrome of inappropriate secretion of antidiuretic hormone: Secondary | ICD-10-CM | POA: Diagnosis present

## 2021-09-03 DIAGNOSIS — R945 Abnormal results of liver function studies: Secondary | ICD-10-CM | POA: Diagnosis not present

## 2021-09-03 DIAGNOSIS — I480 Paroxysmal atrial fibrillation: Secondary | ICD-10-CM | POA: Diagnosis not present

## 2021-09-03 DIAGNOSIS — I083 Combined rheumatic disorders of mitral, aortic and tricuspid valves: Secondary | ICD-10-CM | POA: Diagnosis not present

## 2021-09-03 DIAGNOSIS — R0600 Dyspnea, unspecified: Secondary | ICD-10-CM

## 2021-09-03 DIAGNOSIS — R0601 Orthopnea: Secondary | ICD-10-CM | POA: Diagnosis not present

## 2021-09-03 DIAGNOSIS — M1990 Unspecified osteoarthritis, unspecified site: Secondary | ICD-10-CM | POA: Diagnosis not present

## 2021-09-03 DIAGNOSIS — Z7901 Long term (current) use of anticoagulants: Secondary | ICD-10-CM

## 2021-09-03 DIAGNOSIS — H409 Unspecified glaucoma: Secondary | ICD-10-CM

## 2021-09-03 DIAGNOSIS — J90 Pleural effusion, not elsewhere classified: Secondary | ICD-10-CM

## 2021-09-03 LAB — CBC WITH DIFFERENTIAL/PLATELET
Abs Immature Granulocytes: 0.05 10*3/uL (ref 0.00–0.07)
Basophils Absolute: 0.1 10*3/uL (ref 0.0–0.1)
Basophils Relative: 0 %
Eosinophils Absolute: 0 10*3/uL (ref 0.0–0.5)
Eosinophils Relative: 0 %
HCT: 36.2 % (ref 36.0–46.0)
Hemoglobin: 12.1 g/dL (ref 12.0–15.0)
Immature Granulocytes: 0 %
Lymphocytes Relative: 7 %
Lymphs Abs: 0.8 10*3/uL (ref 0.7–4.0)
MCH: 29.5 pg (ref 26.0–34.0)
MCHC: 33.4 g/dL (ref 30.0–36.0)
MCV: 88.3 fL (ref 80.0–100.0)
Monocytes Absolute: 0.6 10*3/uL (ref 0.1–1.0)
Monocytes Relative: 5 %
Neutro Abs: 10.9 10*3/uL — ABNORMAL HIGH (ref 1.7–7.7)
Neutrophils Relative %: 88 %
Platelets: 266 10*3/uL (ref 150–400)
RBC: 4.1 MIL/uL (ref 3.87–5.11)
RDW: 15.5 % (ref 11.5–15.5)
WBC: 12.5 10*3/uL — ABNORMAL HIGH (ref 4.0–10.5)
nRBC: 0 % (ref 0.0–0.2)

## 2021-09-03 LAB — COMPREHENSIVE METABOLIC PANEL
ALT: 159 U/L — ABNORMAL HIGH (ref 0–44)
AST: 55 U/L — ABNORMAL HIGH (ref 15–41)
Albumin: 3.4 g/dL — ABNORMAL LOW (ref 3.5–5.0)
Alkaline Phosphatase: 456 U/L — ABNORMAL HIGH (ref 38–126)
Anion gap: 11 (ref 5–15)
BUN: 21 mg/dL (ref 8–23)
CO2: 21 mmol/L — ABNORMAL LOW (ref 22–32)
Calcium: 8.9 mg/dL (ref 8.9–10.3)
Chloride: 95 mmol/L — ABNORMAL LOW (ref 98–111)
Creatinine, Ser: 0.65 mg/dL (ref 0.44–1.00)
GFR, Estimated: >60 mL/min (ref >60)
Glucose, Bld: 213 mg/dL — ABNORMAL HIGH (ref 70–99)
Potassium: 4.6 mmol/L (ref 3.5–5.1)
Sodium: 127 mmol/L — ABNORMAL LOW (ref 135–145)
Total Bilirubin: 1.5 mg/dL — ABNORMAL HIGH (ref 0.3–1.2)
Total Protein: 6.2 g/dL — ABNORMAL LOW (ref 6.5–8.1)

## 2021-09-03 LAB — BILIRUBIN, FRACTIONATED(TOT/DIR/INDIR)
Bilirubin, Direct: 0.5 mg/dL — ABNORMAL HIGH (ref 0.0–0.2)
Indirect Bilirubin: 0.7 mg/dL (ref 0.3–0.9)
Total Bilirubin: 1.2 mg/dL (ref 0.3–1.2)

## 2021-09-03 LAB — URINALYSIS, ROUTINE W REFLEX MICROSCOPIC
Bacteria, UA: NONE SEEN
Bilirubin Urine: NEGATIVE
Glucose, UA: NEGATIVE mg/dL
Hgb urine dipstick: NEGATIVE
Ketones, ur: 5 mg/dL — AB
Leukocytes,Ua: NEGATIVE
Nitrite: NEGATIVE
Protein, ur: 100 mg/dL — AB
Specific Gravity, Urine: 1.023 (ref 1.005–1.030)
pH: 5 (ref 5.0–8.0)

## 2021-09-03 LAB — RENAL FUNCTION PANEL
Albumin: 3.9 g/dL (ref 3.5–5.0)
Anion gap: 11 (ref 5–15)
BUN: 19 mg/dL (ref 8–23)
CO2: 28 mmol/L (ref 22–32)
Calcium: 9.1 mg/dL (ref 8.9–10.3)
Chloride: 92 mmol/L — ABNORMAL LOW (ref 98–111)
Creatinine, Ser: 0.93 mg/dL (ref 0.44–1.00)
GFR, Estimated: 59 mL/min — ABNORMAL LOW (ref >60)
Glucose, Bld: 169 mg/dL — ABNORMAL HIGH (ref 70–99)
Phosphorus: 3.2 mg/dL (ref 2.5–4.6)
Potassium: 4 mmol/L (ref 3.5–5.1)
Sodium: 131 mmol/L — ABNORMAL LOW (ref 135–145)

## 2021-09-03 LAB — OSMOLALITY, URINE: Osmolality, Ur: 251 mOsm/kg — ABNORMAL LOW (ref 300–900)

## 2021-09-03 LAB — PROCALCITONIN: Procalcitonin: 0.1 ng/mL

## 2021-09-03 LAB — SODIUM, URINE, RANDOM: Sodium, Ur: 89 mmol/L

## 2021-09-03 LAB — TROPONIN I (HIGH SENSITIVITY)
Troponin I (High Sensitivity): 24 ng/L — ABNORMAL HIGH (ref <18)
Troponin I (High Sensitivity): 20 ng/L — ABNORMAL HIGH (ref <18)

## 2021-09-03 LAB — BRAIN NATRIURETIC PEPTIDE: B Natriuretic Peptide: 1182 pg/mL — ABNORMAL HIGH (ref 0.0–100.0)

## 2021-09-03 MED ORDER — FUROSEMIDE 10 MG/ML IJ SOLN: 40.0000 mg | Freq: Two times a day (BID) | INTRAMUSCULAR | Status: DC

## 2021-09-03 MED ORDER — METHIMAZOLE 10 MG PO TABS
10.0000 mg | ORAL_TABLET | Freq: Two times a day (BID) | ORAL | Status: DC
  Administered 2021-09-03 – 2021-09-06 (×6): 10 mg via ORAL
  Filled 2021-09-03 (×7): qty 1

## 2021-09-03 MED ORDER — ACETAMINOPHEN 325 MG PO TABS: 650.0000 mg | ORAL_TABLET | Freq: Four times a day (QID) | ORAL | Status: DC | PRN

## 2021-09-03 MED ORDER — TIMOLOL MALEATE 0.5 % OP SOLN
1.0000 [drp] | Freq: Two times a day (BID) | OPHTHALMIC | Status: DC
  Administered 2021-09-04 – 2021-09-06 (×6): 1 [drp] via OPHTHALMIC
  Filled 2021-09-03: qty 5

## 2021-09-03 MED ORDER — ACETAMINOPHEN 650 MG RE SUPP: 650.0000 mg | Freq: Four times a day (QID) | RECTAL | Status: DC | PRN

## 2021-09-03 MED ORDER — OMEGA-3-ACID ETHYL ESTERS 1 G PO CAPS
1.0000 g | ORAL_CAPSULE | Freq: Every day | ORAL | Status: DC
  Administered 2021-09-04 – 2021-09-06 (×3): 1 g via ORAL
  Filled 2021-09-03 (×5): qty 1

## 2021-09-03 MED ORDER — DILTIAZEM HCL ER COATED BEADS 180 MG PO CP24
180.0000 mg | ORAL_CAPSULE | Freq: Every day | ORAL | Status: DC
  Administered 2021-09-04 – 2021-09-06 (×3): 180 mg via ORAL
  Filled 2021-09-03 (×5): qty 1

## 2021-09-03 MED ORDER — LEFLUNOMIDE 20 MG PO TABS
10.0000 mg | ORAL_TABLET | Freq: Every morning | ORAL | Status: DC
  Administered 2021-09-04 – 2021-09-06 (×3): 10 mg via ORAL
  Filled 2021-09-03 (×3): qty 0.5

## 2021-09-03 MED ORDER — ADULT MULTIVITAMIN W/MINERALS CH
1.0000 | ORAL_TABLET | Freq: Every morning | ORAL | Status: DC
  Administered 2021-09-04 – 2021-09-06 (×3): 1 via ORAL
  Filled 2021-09-03 (×3): qty 1

## 2021-09-03 MED ORDER — MONTELUKAST SODIUM 10 MG PO TABS
10.0000 mg | ORAL_TABLET | Freq: Every evening | ORAL | Status: DC
  Administered 2021-09-03 – 2021-09-05 (×3): 10 mg via ORAL
  Filled 2021-09-03 (×4): qty 1

## 2021-09-03 MED ORDER — LATANOPROST 0.005 % OP SOLN
1.0000 [drp] | Freq: Every day | OPHTHALMIC | Status: DC
  Administered 2021-09-04 – 2021-09-05 (×3): 1 [drp] via OPHTHALMIC
  Filled 2021-09-03: qty 2.5

## 2021-09-03 MED ORDER — FUROSEMIDE 10 MG/ML IJ SOLN
20.0000 mg | Freq: Once | INTRAMUSCULAR | Status: AC
  Administered 2021-09-03: 20 mg via INTRAVENOUS
  Filled 2021-09-03: qty 4

## 2021-09-03 MED ORDER — FAMOTIDINE 20 MG PO TABS
20.0000 mg | ORAL_TABLET | Freq: Two times a day (BID) | ORAL | Status: DC
  Administered 2021-09-03 – 2021-09-06 (×6): 20 mg via ORAL
  Filled 2021-09-03 (×6): qty 1

## 2021-09-03 MED ORDER — PREDNISONE 5 MG PO TABS
2.5000 mg | ORAL_TABLET | Freq: Every day | ORAL | Status: DC
  Administered 2021-09-04 – 2021-09-06 (×2): 2.5 mg via ORAL
  Filled 2021-09-03 (×3): qty 1

## 2021-09-03 MED ORDER — METOPROLOL SUCCINATE ER 100 MG PO TB24
100.0000 mg | ORAL_TABLET | Freq: Every morning | ORAL | Status: DC
  Administered 2021-09-04: 100 mg via ORAL
  Filled 2021-09-03: qty 1

## 2021-09-03 MED ORDER — ONDANSETRON HCL 4 MG PO TABS: 4.0000 mg | ORAL_TABLET | Freq: Four times a day (QID) | ORAL | Status: DC | PRN

## 2021-09-03 MED ORDER — APIXABAN 2.5 MG PO TABS
2.5000 mg | ORAL_TABLET | Freq: Two times a day (BID) | ORAL | Status: DC
  Administered 2021-09-03 – 2021-09-06 (×6): 2.5 mg via ORAL
  Filled 2021-09-03 (×6): qty 1

## 2021-09-03 MED ORDER — FUROSEMIDE 10 MG/ML IJ SOLN
20.0000 mg | Freq: Every day | INTRAMUSCULAR | Status: DC
  Administered 2021-09-03 – 2021-09-04 (×2): 20 mg via INTRAVENOUS
  Filled 2021-09-03: qty 4
  Filled 2021-09-03: qty 2

## 2021-09-03 MED ORDER — LORATADINE 10 MG PO TABS: 10.0000 mg | ORAL_TABLET | Freq: Every day | ORAL | Status: DC

## 2021-09-03 MED ORDER — LORATADINE 10 MG PO TABS
10.0000 mg | ORAL_TABLET | Freq: Every day | ORAL | Status: DC
  Administered 2021-09-04 – 2021-09-06 (×3): 10 mg via ORAL
  Filled 2021-09-03 (×3): qty 1

## 2021-09-03 MED ORDER — METOPROLOL TARTRATE 5 MG/5ML IV SOLN
5.0000 mg | Freq: Once | INTRAVENOUS | Status: AC
  Administered 2021-09-03: 5 mg via INTRAVENOUS
  Filled 2021-09-03: qty 5

## 2021-09-03 MED ORDER — ONDANSETRON HCL 4 MG/2ML IJ SOLN: 4.0000 mg | Freq: Four times a day (QID) | INTRAMUSCULAR | Status: DC | PRN

## 2021-09-03 NOTE — Consult Note (Signed)
Cardiology Consultation:   Patient ID: Emily Robertson MRN: [id number]; DOB: 10/31/1930  Admit date: 09/03/2021 Date of Consult: 09/03/2021  PCP:  Miller, Lisa B, FNP   [facility] Providers Cardiologist:  Brian Crenshaw, MD    Patient Profile:   Emily Robertson is a 88 y.o. female with a hx of recently diagnosed atrial fibrillation, hypertension, hyperlipidemia, rheumatoid arthritis, and glaucoma who is being seen 09/03/2021 for the evaluation of CHF and atrial fibrillation at the request of Dr. Kyle.  History of Present Illness:   Emily Robertson was hospitalized June 2023 following a fall that resulted in a pelvic fracture.  On presentation she was also found to be in new onset atrial fibrillation.  Cardiology was consulted.  She was placed on Cardizem and metoprolol.  Echocardiogram revealed normal LVEF of 60 to 65%, mildly elevated pulmonary artery systolic pressure, mild to moderate TR, and moderate to severe mitral regurgitation.  She was started on anticoagulation with Eliquis.  She was also found to have hyperthyroidism with low TSH and elevated T4 and T3.  Of note patient has had multiple falls over the last year.  Prior to that admission she ambulated with a cane.  She was completely unaware of her A-fib. She was discharged on Cardizem 180 mg daily.  Plan was to control hyperthyroidism for better rate control of her A-fib.  She presents back to [facility] ED with shortness of breath and A-fib with RVR.  She was admitted to internal medicine service and cardiology consulted for possible CHF and RVR. During my interview, she describes push mowing her yard up until last fall. She has remained as active as possible with her RA until her pelvic fracture last month. She discharged to rehab and then home. She was not scheduled for cardiology follow up after last admission.   She reports two nights of dyspnea, worse when lying flat but also occurred during waking hours at rest. She denies chest  pain, palpitations, and dizziness/pre-syncope. She feels her breathing is improved after lasix - received one dose of 20 mg IV lasix.  Afib rates in the 100s after IV lopressor.  She also describes a 10 lb weight loss leading up to her fall last month. She has struggled with low appetite since discharge and new medications.    Past Medical History:  Diagnosis Date   Arthritis    Glaucoma of both eyes    Hyperlipidemia    Hypertension    Pelvic prolapse    anterior floor   Urgency of urination    Urinary hesitancy    Urticaria     Past Surgical History:  Procedure Laterality Date   PUBOVAGINAL SLING N/A 08/31/2012   Procedure: BOSTON SCIENTIFIC UPHOLD LIGHT SACROSPINUS REPAIR;  Surgeon: Sigmund I Tannenbaum, MD;  Location: [facility];  Service: Urology;  Laterality: N/A;   RETINAL DETACHMENT SURGERY Right      Home Medications:  Prior to Admission medications   Medication Sig Start Date End Date Taking? Authorizing Provider  acetaminophen (TYLENOL) 325 MG tablet Take 2 tablets (650 mg total) by mouth 3 (three) times daily. Patient taking differently: Take 325 mg by mouth daily with supper. 07/31/21  Yes Mathews, Elizabeth G, MD  alendronate (FOSAMAX) 70 MG tablet Take 70 mg by mouth every Wednesday. 08/10/14  Yes [provider]  apixaban (ELIQUIS) 2.5 MG TABS tablet Take 1 tablet (2.5 mg total) by mouth 2 (two) times daily. 07/31/21  Yes Mathews, Elizabeth G, MD  Ascorbic   Acid (VITAMIN C PO) Take 1 tablet by mouth every morning.   Yes [provider]  atorvastatin (LIPITOR) 20 MG tablet Take 1 tablet (20 mg total) by mouth at bedtime. 07/31/21  Yes Mathews, Elizabeth G, MD  CALCIUM PO Take 1 tablet by mouth daily with supper.   Yes [provider]  cetirizine (ZYRTEC) 10 MG tablet Take 1 tablet (10 mg total) by mouth 2 (two) times daily. 10/28/19  Yes Padgett, Shaylar Patricia, MD  Cholecalciferol (VITAMIN D3 PO) Take 1 capsule by mouth daily  with supper.   Yes [provider]  diltiazem (CARDIZEM CD) 180 MG 24 hr capsule Take 1 capsule (180 mg total) by mouth daily. 08/01/21  Yes Mathews, Elizabeth G, MD  EPINEPHrine (EPIPEN) 0.3 mg/0.3 mL SOAJ injection Inject 0.3 mLs (0.3 mg total) into the muscle once. Patient taking differently: Inject 0.3 mg into the muscle once as needed for anaphylaxis. 12/01/12  Yes Babcock, Peter E, NP  famotidine (PEPCID) 20 MG tablet Take 1 tablet (20 mg total) by mouth 2 (two) times daily. 10/28/19  Yes Padgett, Shaylar Patricia, MD  latanoprost (XALATAN) 0.005 % ophthalmic solution Place 1 drop into the left eye at bedtime. 09/14/19  Yes [provider]  leflunomide (ARAVA) 10 MG tablet Take 10 mg by mouth every morning. 07/24/21  Yes [provider]  methimazole (TAPAZOLE) 10 MG tablet Take 1 tablet (10 mg total) by mouth 2 (two) times daily. 07/31/21  Yes Mathews, Elizabeth G, MD  metoprolol succinate (TOPROL-XL) 100 MG 24 hr tablet Take 100 mg by mouth every morning. 06/18/21  Yes [provider]  Misc Natural Products (OSTEO BI-FLEX JOINT SHIELD PO) Take 1 capsule by mouth daily with supper.   Yes [provider]  montelukast (SINGULAIR) 10 MG tablet Take 1 tablet (10 mg total) by mouth daily. Patient taking differently: Take 10 mg by mouth every evening. 12/06/19  Yes Padgett, Shaylar Patricia, MD  Multiple Vitamin (MULTIVITAMIN WITH MINERALS) TABS tablet Take 1 tablet by mouth every morning.   Yes [provider]  Omega-3 Fatty Acids (FISH OIL) 1000 MG CAPS Take 1,000 mg by mouth every morning.   Yes [provider]  predniSONE (DELTASONE) 2.5 MG tablet Take 1 tablet (2.5 mg total) by mouth daily with breakfast. 08/01/21  Yes Mathews, Elizabeth G, MD  Probiotic Product (PROBIOTIC DAILY PO) Take 1 capsule by mouth daily with supper.   Yes [provider]  timolol (TIMOPTIC) 0.5 % ophthalmic solution Place 1 drop into both eyes 2 (two) times  daily.   Yes [provider]  omega-3 acid ethyl esters (LOVAZA) 1 g capsule Take 1 capsule (1 g total) by mouth daily. Patient not taking: Reported on 09/03/2021 08/01/21   Mathews, Elizabeth G, MD  traMADol (ULTRAM) 50 MG tablet 1-2 tablets every 6 hours as needed for pain. May take with acetaminophen. Patient not taking: Reported on 09/03/2021 07/31/21   Mathews, Elizabeth G, MD    Inpatient Medications: Scheduled Meds:  Continuous Infusions:  PRN Meds:   Allergies:    Allergies  Allergen Reactions   Cozaar [Losartan] Swelling    Tongue swelling   Erythromycin Itching   Azopt [Brinzolamide] Rash    Rash on eyelid    Social History:   Social History   Socioeconomic History   Marital status: Widowed    Spouse name: Not on file   Number of children: Not on file   Years of education: Not on file     Highest education level: Not on file  Occupational History   Not on file  Tobacco Use   Smoking status: Never   Smokeless tobacco: Never  Substance and Sexual Activity   Alcohol use: Yes    Comment: rare   Drug use: No   Sexual activity: Not on file  Other Topics Concern   Not on file  Social History Narrative   Not on file   Social Determinants of Health   Financial Resource Strain: Not on file  Food Insecurity: Not on file  Transportation Needs: Not on file  Physical Activity: Not on file  Stress: Not on file  Social Connections: Not on file  Intimate Partner Violence: Not on file    Family History:    Family History  Problem Relation Age of Onset   Hypertension Mother    Thyroid disease Neg Hx    Diabetes Neg Hx      ROS:  Please see the history of present illness.   All other ROS reviewed and negative.     Physical Exam/Data:   Vitals:   09/03/21 1500 09/03/21 1530 09/03/21 1600 09/03/21 1630  BP: (!) 162/105 (!) 159/141 (!) 170/157 (!) 144/115  Pulse: (!) 110 (!) 107 86 (!) 102  Resp: (!) 25 (!) 24 (!) 23 20  Temp:      TempSrc:       SpO2: 93% 94% 98% 97%  Weight:      Height:       No intake or output data in the 24 hours ending 09/03/21 1705    09/03/2021    9:08 AM 07/26/2021    9:29 AM 10/28/2019    8:34 AM  Last 3 Weights  Weight (lbs) 105 lb 130 lb 123 lb 6.4 oz  Weight (kg) 47.628 kg 58.968 kg 55.974 kg     Body mass index is 18.02 kg/m.  General:  elderly female in NAD HEENT: normal Neck: + JVD Vascular: No carotid bruits; Distal pulses 2+ bilaterally Cardiac:  irregular rhythm, tachycardic rate, MR murmur Lungs:  clear to auscultation bilaterally, no wheezing, rhonchi or rales  Abd: soft, nontender, no hepatomegaly  Ext: no edema Musculoskeletal:  No deformities, BUE and BLE strength normal and equal Skin: warm and dry  Neuro:  CNs 2-12 intact, no focal abnormalities noted Psych:  Normal affect   EKG:  The EKG was personally reviewed and demonstrates:  not yet completed Telemetry:  Telemetry was personally reviewed and demonstrates:  Afib with VR in the 100s  Relevant CV Studies:  Echo 07/2021: 1. Left ventricular ejection fraction, by estimation, is 60 to 65%. The  left ventricle has normal function. The left ventricle has no regional  wall motion abnormalities. Left ventricular diastolic function could not  be evaluated.   2. Right ventricular systolic function is normal. The right ventricular  size is normal. There is mildly elevated pulmonary artery systolic  pressure.   3. Left atrial size was severely dilated.   4. Right atrial size was moderately dilated.   5. The mitral valve is normal in structure. Moderate to severe mitral  valve regurgitation. No evidence of mitral stenosis.   6. Tricuspid valve regurgitation is mild to moderate.   7. The aortic valve is tricuspid. Aortic valve regurgitation is mild. No  aortic stenosis is present.   8. The inferior vena cava is normal in size with greater than 50%  respiratory variability, suggesting right atrial pressure of 3 mmHg.    Laboratory Data:    High Sensitivity Troponin:   Recent Labs  Lab 09/03/21 1100  TROPONINIHS 24*     Chemistry Recent Labs  Lab 09/03/21 1100  NA 127*  K 4.6  CL 95*  CO2 21*  GLUCOSE 213*  BUN 21  CREATININE 0.65  CALCIUM 8.9  GFRNONAA >60  ANIONGAP 11    Recent Labs  Lab 09/03/21 1100  PROT 6.2*  ALBUMIN 3.4*  AST 55*  ALT 159*  ALKPHOS 456*  BILITOT 1.5*   Lipids No results for input(s): "CHOL", "TRIG", "HDL", "LABVLDL", "LDLCALC", "CHOLHDL" in the last 168 hours.  Hematology Recent Labs  Lab 09/03/21 1100  WBC 12.5*  RBC 4.10  HGB 12.1  HCT 36.2  MCV 88.3  MCH 29.5  MCHC 33.4  RDW 15.5  PLT 266   Thyroid No results for input(s): "TSH", "FREET4" in the last 168 hours.  BNP Recent Labs  Lab 09/03/21 0935  BNP 1,182.0*    DDimer No results for input(s): "DDIMER" in the last 168 hours.   Radiology/Studies:  CT Chest Wo Contrast  Result Date: 09/03/2021 CLINICAL DATA:  Weakness, fatigue EXAM: CT CHEST WITHOUT CONTRAST TECHNIQUE: Multidetector CT imaging of the chest was performed following the standard protocol without IV contrast. RADIATION DOSE REDUCTION: This exam was performed according to the departmental dose-optimization program which includes automated exposure control, adjustment of the mA and/or kV according to patient size and/or use of iterative reconstruction technique. COMPARISON:  CT 01/12/2021 FINDINGS: Cardiovascular: Heart size is mildly enlarged. No pericardial effusion. Thoracic aorta is nonaneurysmal. Atherosclerotic calcifications of the aorta and coronary arteries. Mediastinum/Nodes: No axillary lymphadenopathy. Redemonstration mildly enlarged mediastinal lymph nodes including 10 mm right paratracheal node (series 2, image 52) and 15 mm precarinal node (series 2, image 61). Evaluation of the hilar structures is limited in the absence of intravenous contrast. Within this limitation, no obvious hilar adenopathy or mass is identified.  Thyroid gland, trachea, and esophagus demonstrate no significant findings. Lungs/Pleura: Small-moderate-sized layering bilateral pleural effusions with associated compressive atelectasis. Extensive biapical pleuroparenchymal scarring. Ground-glass nodular density at the right lung apex measures approximately 2.1 x 1.8 cm (series 7, image 32), previously approximately 1.4 x 1.0 cm. Upper Abdomen: No acute abnormality. Musculoskeletal: Multilevel spondylosis of the thoracic spine and imaged lower cervical spine. Bones appear demineralized. No acute bony findings. No chest wall abnormality. IMPRESSION: 1. Small-moderate-sized layering bilateral pleural effusions with associated compressive atelectasis. 2. Ground-glass nodular density at the right lung apex measuring approximately 2.1 x 1.8 cm, previously measured 1.4 x 1.0 cm. Adenocarcinoma cannot be excluded. PET-CT and/or tissue sampling should be considered with patient's age and functional status taken into consideration. 3. Redemonstration of mildly enlarged mediastinal lymph nodes, which may be reactive. 4. Aortic and coronary artery atherosclerosis. Aortic Atherosclerosis (ICD10-I70.0). Electronically Signed   By: Nicholas  Plundo D.O.   On: 09/03/2021 13:15   DG Chest Port 1 View  Result Date: 09/03/2021 CLINICAL DATA:  Shortness of breath. EXAM: PORTABLE CHEST 1 VIEW COMPARISON:  December 19, 2020 FINDINGS: Chronic nodular opacities at the right lung apex, not well assessed on this study. Bibasilar opacities. No visible pleural effusions or pneumothorax. Cardiomediastinal silhouette is unchanged. Polyarticular degenerative change in osteopenia. IMPRESSION: 1. Bibasilar opacities, which could represent atelectasis and/or pneumonia. 2. Chronic nodular opacities at the right lung apex, not well assessed on this study. Please see prior 01/12/2021 CT chest for follow-up recommendations. Electronically Signed   By: Frederick S Jones M.D.   On: 09/03/2021 09:44        Assessment and Plan:   Shortness of breath Likely acute diastolic heart failure Moderate to severe MR - BNP 1182 - recent echo with LVEF 60-65%, diastolic function difficult to determine given Afib, but likely diastolic dysfunction - MR newly recognized last admission - chest imaging with bilateral pleural effusions with ground glass nodular density in the right lung apex, can't exclude adenocarcinoma - will obtain limited echo to check EF - feels much better after 20 mg IV lasix x 1 dose - agree with 20 mg IV lasix daily - suspect she will be intolerant of small shifts in volume/mild hypervolemia - if pressure room, consider ARB/MRA - further medication selection pending EF evaluation - if reduced EF, will likely need to stop cardizem, antiarrhythmic options are limited given CAD on chest CT and CHF   Afib RVR Chronic anticoagulation - felt secondary to trauma and hyperthyroidism, although also with moderate to severe MR - received PO cardizem today - will obtain limited echo as above - rates in the 100s - home 100 mg toprol was continued - she has not missed any doses of OAC - avoid amiodarone given thyroid disease - may consider digoxin if rate control needed - will likely improve as volume status and thyroid disease improves - hold off on DCCV for now given thyroid disease, will likely not stay in rhythm until thyroid is controlled   Hyperthyroidism Confirmed in June 2023 during her admission for pelvic fracture - recently increased "thyroid medication"   Aortic and coronary artery atherosclerosis  - seen on CT chest - no prior evaluation - no chest pain   Elevated LFTs AST and ALT mildly elevated Alk phos 456 - question if related to CHF, limited echo as above   Leukocytosis WBC 12.5 UA unremarkable Defer to primary    Risk Assessment/Risk Scores:       New York Heart Association (NYHA) Functional Class NYHA Class IV  CHA2DS2-VASc Score = 6    This indicates a 9.7% annual risk of stroke. The patient's score is based upon: CHF History: 1 HTN History: 1 Diabetes History: 0 Stroke History: 0 Vascular Disease History: 1 Age Score: 2 Gender Score: 1    For questions or updates, please contact [facility] Please consult [email or web address] for contact info under    Signed, Arielle Eber Nicole Daelon Dunivan, PA  09/03/2021 5:05 PM  

## 2021-09-03 NOTE — ED Provider Notes (Signed)
[facility]-EMERGENCY DEPT Provider Note   CSN: [id number] Arrival date & time: 09/03/21  5784     History {Add pertinent medical, surgical, social history, OB history to HPI:1} Chief Complaint  Patient presents with   Weakness    Emily Robertson is a 88 y.o. female.  Patient complains of shortness of breath and weakness.  She has a history of hypertension hyperlipidemia atrial fibs and thyroid disease.   Weakness      Home Medications Prior to Admission medications   Medication Sig Start Date End Date Taking? Authorizing Provider  acetaminophen (TYLENOL) 325 MG tablet Take 2 tablets (650 mg total) by mouth 3 (three) times daily. 07/31/21   Alwyn Ren, MD  alendronate (FOSAMAX) 70 MG tablet Take 1 tablet by mouth once a week. wednesday 08/10/14   [provider]  apixaban (ELIQUIS) 2.5 MG TABS tablet Take 1 tablet (2.5 mg total) by mouth 2 (two) times daily. 07/31/21   Alwyn Ren, MD  atorvastatin (LIPITOR) 20 MG tablet Take 1 tablet (20 mg total) by mouth at bedtime. 07/31/21   Alwyn Ren, MD  Calcium Carb-Cholecalciferol (CALCIUM 500 +D PO) Take 1 tablet by mouth daily.    [provider]  cetirizine (ZYRTEC) 10 MG tablet Take 1 tablet (10 mg total) by mouth 2 (two) times daily. 10/28/19   Marcelyn Bruins, MD  Cholecalciferol (VITAMIN D3) 1000 UNITS CAPS Take 1 capsule by mouth daily.    [provider]  diltiazem (CARDIZEM CD) 180 MG 24 hr capsule Take 1 capsule (180 mg total) by mouth daily. 08/01/21   Alwyn Ren, MD  EPINEPHrine (EPIPEN) 0.3 mg/0.3 mL SOAJ injection Inject 0.3 mLs (0.3 mg total) into the muscle once. Patient taking differently: Inject 0.3 mg into the muscle once as needed for anaphylaxis. 12/01/12   Simonne Martinet, NP  famotidine (PEPCID) 20 MG tablet Take 1 tablet (20 mg total) by mouth 2 (two) times daily. 10/28/19   Marcelyn Bruins, MD  latanoprost  (XALATAN) 0.005 % ophthalmic solution Place 1 drop into the left eye at bedtime. 09/14/19   [provider]  leflunomide (ARAVA) 10 MG tablet Take 10 mg by mouth daily. 07/24/21   [provider]  LYSINE PO Take 1 capsule by mouth daily.    [provider]  methimazole (TAPAZOLE) 10 MG tablet Take 1 tablet (10 mg total) by mouth 2 (two) times daily. 07/31/21   Alwyn Ren, MD  metoprolol succinate (TOPROL-XL) 100 MG 24 hr tablet Take 100 mg by mouth daily. 06/18/21   [provider]  Misc Natural Products (OSTEO BI-FLEX JOINT SHIELD PO) Take 1 capsule by mouth daily.    [provider]  montelukast (SINGULAIR) 10 MG tablet Take 1 tablet (10 mg total) by mouth daily. Patient taking differently: Take 10 mg by mouth every evening. 12/06/19   Marcelyn Bruins, MD  Multiple Vitamin (MULTIVITAMIN) tablet Take 1 tablet by mouth daily.    [provider]  omega-3 acid ethyl esters (LOVAZA) 1 g capsule Take 1 capsule (1 g total) by mouth daily. 08/01/21   Alwyn Ren, MD  predniSONE (DELTASONE) 2.5 MG tablet Take 1 tablet (2.5 mg total) by mouth daily with breakfast. 08/01/21   Alwyn Ren, MD  Probiotic Product (PROBIOTIC DAILY PO) Take 1 capsule by mouth daily.    [provider]  timolol (TIMOPTIC) 0.5 % ophthalmic solution Place 1 drop into both eyes 2 (  two) times daily.    [provider]  traMADol (ULTRAM) 50 MG tablet 1-2 tablets every 6 hours as needed for pain. May take with acetaminophen. 07/31/21   Alwyn Ren, MD  vitamin C (ASCORBIC ACID) 500 MG tablet Take 500 mg by mouth daily.    [provider]      Allergies    Cozaar [losartan], Azopt [brinzolamide], and Erythromycin    Review of Systems   Review of Systems  Neurological:  Positive for weakness.    Physical Exam Updated Vital Signs BP (!) 169/99 (BP Location: Right Arm)   Pulse 97   Temp 98.5 F (36.9 C) (Oral)    Resp 19   Ht  (1.626 m)   Wt 47.6 kg   SpO2 93%   BMI 18.02 kg/m  Physical Exam  ED Results / Procedures / Treatments   Labs (all labs ordered are listed, but only abnormal results are displayed) Labs Reviewed  CBC WITH DIFFERENTIAL/PLATELET - Abnormal; Notable for the following components:      Result Value   WBC 12.5 (*)    Neutro Abs 10.9 (*)    All other components within normal limits  COMPREHENSIVE METABOLIC PANEL - Abnormal; Notable for the following components:   Sodium 127 (*)    Chloride 95 (*)    CO2 21 (*)    Glucose, Bld 213 (*)    Total Protein 6.2 (*)    Albumin 3.4 (*)    AST 55 (*)    ALT 159 (*)    Alkaline Phosphatase 456 (*)    Total Bilirubin 1.5 (*)    All other components within normal limits  BRAIN NATRIURETIC PEPTIDE - Abnormal; Notable for the following components:   B Natriuretic Peptide 1,182.0 (*)    All other components within normal limits  TROPONIN I (HIGH SENSITIVITY) - Abnormal; Notable for the following components:   Troponin I (High Sensitivity) 24 (*)    All other components within normal limits  URINALYSIS, ROUTINE W REFLEX MICROSCOPIC  TROPONIN I (HIGH SENSITIVITY)    EKG None  Radiology CT Chest Wo Contrast  Result Date: 09/03/2021 CLINICAL DATA:  Weakness, fatigue EXAM: CT CHEST WITHOUT CONTRAST TECHNIQUE: Multidetector CT imaging of the chest was performed following the standard protocol without IV contrast. RADIATION DOSE REDUCTION: This exam was performed according to the departmental dose-optimization program which includes automated exposure control, adjustment of the mA and/or kV according to patient size and/or use of iterative reconstruction technique. COMPARISON:  CT 01/12/2021 FINDINGS: Cardiovascular: Heart size is mildly enlarged. No pericardial effusion. Thoracic aorta is nonaneurysmal. Atherosclerotic calcifications of the aorta and coronary arteries. Mediastinum/Nodes: No axillary lymphadenopathy.  Redemonstration mildly enlarged mediastinal lymph nodes including 10 mm right paratracheal node (series 2, image 52) and 15 mm precarinal node (series 2, image 61). Evaluation of the hilar structures is limited in the absence of intravenous contrast. Within this limitation, no obvious hilar adenopathy or mass is identified. Thyroid gland, trachea, and esophagus demonstrate no significant findings. Lungs/Pleura: Small-moderate-sized layering bilateral pleural effusions with associated compressive atelectasis. Extensive biapical pleuroparenchymal scarring. Ground-glass nodular density at the right lung apex measures approximately 2.1 x 1.8 cm (series 7, image 32), previously approximately 1.4 x 1.0 cm. Upper Abdomen: No acute abnormality. Musculoskeletal: Multilevel spondylosis of the thoracic spine and imaged lower cervical spine. Bones appear demineralized. No acute bony findings. No chest wall abnormality. IMPRESSION: 1. Small-moderate-sized layering bilateral pleural effusions with associated compressive atelectasis. 2. Ground-glass nodular density  at the right lung apex measuring approximately 2.1 x 1.8 cm, previously measured 1.4 x 1.0 cm. Adenocarcinoma cannot be excluded. PET-CT and/or tissue sampling should be considered with patient's age and functional status taken into consideration. 3. Redemonstration of mildly enlarged mediastinal lymph nodes, which may be reactive. 4. Aortic and coronary artery atherosclerosis. Aortic Atherosclerosis (ICD10-I70.0). Electronically Signed   By: Duanne Guess D.O.   On: 09/03/2021 13:15   DG Chest Port 1 View  Result Date: 09/03/2021 CLINICAL DATA:  Shortness of breath. EXAM: PORTABLE CHEST 1 VIEW COMPARISON:  December 19, 2020 FINDINGS: Chronic nodular opacities at the right lung apex, not well assessed on this study. Bibasilar opacities. No visible pleural effusions or pneumothorax. Cardiomediastinal silhouette is unchanged. Polyarticular degenerative change in  osteopenia. IMPRESSION: 1. Bibasilar opacities, which could represent atelectasis and/or pneumonia. 2. Chronic nodular opacities at the right lung apex, not well assessed on this study. Please see prior 01/12/2021 CT chest for follow-up recommendations. Electronically Signed   By: Feliberto Harts M.D.   On: 09/03/2021 09:44    Procedures Procedures  {Document cardiac monitor, telemetry assessment procedure when appropriate:1}  Medications Ordered in ED Medications  metoprolol tartrate (LOPRESSOR) injection 5 mg (5 mg Intravenous Given 09/03/21 1019)  furosemide (LASIX) injection 20 mg (20 mg Intravenous Given 09/03/21 1358)    ED Course/ Medical Decision Making/ A&P  CRITICAL CARE Performed by: Bethann Berkshire Total critical care time: 40 minutes Critical care time was exclusive of separately billable procedures and treating other patients. Critical care was necessary to treat or prevent imminent or life-threatening deterioration. Critical care was time spent personally by me on the following activities: development of treatment plan with patient and/or surrogate as well as nursing, discussions with consultants, evaluation of patient's response to treatment, examination of patient, obtaining history from patient or surrogate, ordering and performing treatments and interventions, ordering and review of laboratory studies, ordering and review of radiographic studies, pulse oximetry and re-evaluation of patient's condition.   Patient with atrial fibs with mild rapid ventricular rate.  Patient also has congestive heart failure.  Along with tachypnea.  Patient was given some Lasix and Toprol which seemed to help her symptoms some.  She will be seen by cardiology with hospitalist admission                         Medical Decision Making Amount and/or Complexity of Data Reviewed Labs: ordered. Radiology: ordered.  Risk Prescription drug management. Decision regarding hospitalization.   Atrial  for with RVR and congestive heart failure hospital admission with cardiology consult  {Document critical care time when appropriate:1} {Document review of labs and clinical decision tools ie heart score, Chads2Vasc2 etc:1}  {Document your independent review of radiology images, and any outside records:1} {Document your discussion with family members, caretakers, and with consultants:1} {Document social determinants of health affecting pt's care:1} {Document your decision making why or why not admission, treatments were needed:1} Final Clinical Impression(s) / ED Diagnoses Final diagnoses:  Atrial fibrillation with RVR (HCC)    Rx / DC Orders ED Discharge Orders     None

## 2021-09-03 NOTE — ED Notes (Signed)
Patient cleaned of urinary and fecal incontinence.  New linens and gown applied.

## 2021-09-03 NOTE — ED Notes (Signed)
Assisted pt with changing into gown. Placed purewick on pt. Pt A&O 4.

## 2021-09-03 NOTE — H&P (View-Only) (Signed)
Cardiology Consultation:   Patient ID: Emily Robertson MRN: [id number]; DOB: 1930-12-13  Admit date: 09/03/2021 Date of Consult: 09/03/2021  PCP:  [name], [facility] Providers Cardiologist:  Kirk Ruths, MD    Patient Profile:   Emily Robertson is a 88 y.o. female with a hx of recently diagnosed atrial fibrillation, hypertension, hyperlipidemia, rheumatoid arthritis, and glaucoma who is being seen 09/03/2021 for the evaluation of CHF and atrial fibrillation at the request of Dr. Marylyn Ishihara.  History of Present Illness:   Ms. Emily Robertson was hospitalized June 2023 following a fall that resulted in a pelvic fracture.  On presentation she was also found to be in new onset atrial fibrillation.  Cardiology was consulted.  She was placed on Cardizem and metoprolol.  Echocardiogram revealed normal LVEF of 60 to 65%, mildly elevated pulmonary artery systolic pressure, mild to moderate TR, and moderate to severe mitral regurgitation.  She was started on anticoagulation with Eliquis.  She was also found to have hyperthyroidism with low TSH and elevated T4 and T3.  Of note patient has had multiple falls over the last year.  Prior to that admission she ambulated with a cane.  She was completely unaware of her A-fib. She was discharged on Cardizem 180 mg daily.  Plan was to control hyperthyroidism for better rate control of her A-fib.  She presents back to [facility] ED with shortness of breath and A-fib with RVR.  She was admitted to internal medicine service and cardiology consulted for possible CHF and RVR. During my interview, she describes push mowing her yard up until last fall. She has remained as active as possible with her RA until her pelvic fracture last month. She discharged to rehab and then home. She was not scheduled for cardiology follow up after last admission.   She reports two nights of dyspnea, worse when lying flat but also occurred during waking hours at rest. She denies chest  pain, palpitations, and dizziness/pre-syncope. She feels her breathing is improved after lasix - received one dose of 20 mg IV lasix.  Afib rates in the 100s after IV lopressor.  She also describes a 10 lb weight loss leading up to her fall last month. She has struggled with low appetite since discharge and new medications.    Past Medical History:  Diagnosis Date   Arthritis    Glaucoma of both eyes    Hyperlipidemia    Hypertension    Pelvic prolapse    anterior floor   Urgency of urination    Urinary hesitancy    Urticaria     Past Surgical History:  Procedure Laterality Date   PUBOVAGINAL SLING N/A 08/31/2012   Procedure: BOSTON SCIENTIFIC UPHOLD LIGHT [place];  Surgeon: Ailene Rud, MD;  Location: [facility];  Service: Urology;  Laterality: N/A;   RETINAL DETACHMENT SURGERY Right      Home Medications:  Prior to Admission medications   Medication Sig Start Date End Date Taking? Authorizing Provider  acetaminophen (TYLENOL) 325 MG tablet Take 2 tablets (650 mg total) by mouth 3 (three) times daily. Patient taking differently: Take 325 mg by mouth daily with supper. 07/31/21  Yes Georgette Shell, MD  alendronate (FOSAMAX) 70 MG tablet Take 70 mg by mouth every Wednesday. 08/10/14  Yes [provider]  apixaban (ELIQUIS) 2.5 MG TABS tablet Take 1 tablet (2.5 mg total) by mouth 2 (two) times daily. 07/31/21  Yes Georgette Shell, MD  Ascorbic  Acid (VITAMIN C PO) Take 1 tablet by mouth every morning.   Yes [provider]  atorvastatin (LIPITOR) 20 MG tablet Take 1 tablet (20 mg total) by mouth at bedtime. 07/31/21  Yes Georgette Shell, MD  CALCIUM PO Take 1 tablet by mouth daily with supper.   Yes [provider]  cetirizine (ZYRTEC) 10 MG tablet Take 1 tablet (10 mg total) by mouth 2 (two) times daily. 10/28/19  Yes Padgett, Rae Halsted, MD  Cholecalciferol (VITAMIN D3 PO) Take 1 capsule by mouth daily  with supper.   Yes [provider]  diltiazem (CARDIZEM CD) 180 MG 24 hr capsule Take 1 capsule (180 mg total) by mouth daily. 08/01/21  Yes Georgette Shell, MD  EPINEPHrine (EPIPEN) 0.3 mg/0.3 mL SOAJ injection Inject 0.3 mLs (0.3 mg total) into the muscle once. Patient taking differently: Inject 0.3 mg into the muscle once as needed for anaphylaxis. 12/01/12  Yes Erick Colace, NP  famotidine (PEPCID) 20 MG tablet Take 1 tablet (20 mg total) by mouth 2 (two) times daily. 10/28/19  Yes Padgett, Rae Halsted, MD  latanoprost (XALATAN) 0.005 % ophthalmic solution Place 1 drop into the left eye at bedtime. 09/14/19  Yes [provider]  leflunomide (ARAVA) 10 MG tablet Take 10 mg by mouth every morning. 07/24/21  Yes [provider]  methimazole (TAPAZOLE) 10 MG tablet Take 1 tablet (10 mg total) by mouth 2 (two) times daily. 07/31/21  Yes Georgette Shell, MD  metoprolol succinate (TOPROL-XL) 100 MG 24 hr tablet Take 100 mg by mouth every morning. 06/18/21  Yes [provider]  Misc Natural Products (OSTEO BI-FLEX JOINT SHIELD PO) Take 1 capsule by mouth daily with supper.   Yes [provider]  montelukast (SINGULAIR) 10 MG tablet Take 1 tablet (10 mg total) by mouth daily. Patient taking differently: Take 10 mg by mouth every evening. 12/06/19  Yes Padgett, Rae Halsted, MD  Multiple Vitamin (MULTIVITAMIN WITH MINERALS) TABS tablet Take 1 tablet by mouth every morning.   Yes [provider]  Omega-3 Fatty Acids (FISH OIL) 1000 MG CAPS Take 1,000 mg by mouth every morning.   Yes [provider]  predniSONE (DELTASONE) 2.5 MG tablet Take 1 tablet (2.5 mg total) by mouth daily with breakfast. 08/01/21  Yes Georgette Shell, MD  Probiotic Product (PROBIOTIC DAILY PO) Take 1 capsule by mouth daily with supper.   Yes [provider]  timolol (TIMOPTIC) 0.5 % ophthalmic solution Place 1 drop into both eyes 2 (two) times  daily.   Yes [provider]  omega-3 acid ethyl esters (LOVAZA) 1 g capsule Take 1 capsule (1 g total) by mouth daily. Patient not taking: Reported on 09/03/2021 08/01/21   Georgette Shell, MD  traMADol (ULTRAM) 50 MG tablet 1-2 tablets every 6 hours as needed for pain. May take with acetaminophen. Patient not taking: Reported on 09/03/2021 07/31/21   Georgette Shell, MD    Inpatient Medications: Scheduled Meds:  Continuous Infusions:  PRN Meds:   Allergies:    Allergies  Allergen Reactions   Cozaar [Losartan] Swelling    Tongue swelling   Erythromycin Itching   Azopt [Brinzolamide] Rash    Rash on eyelid    Social History:   Social History   Socioeconomic History   Marital status: Widowed    Spouse name: Not on file   Number of children: Not on file   Years of education: Not on file  Highest education level: Not on file  Occupational History   Not on file  Tobacco Use   Smoking status: Never   Smokeless tobacco: Never  Substance and Sexual Activity   Alcohol use: Yes    Comment: rare   Drug use: No   Sexual activity: Not on file  Other Topics Concern   Not on file  Social History Narrative   Not on file   Social Determinants of Health   Financial Resource Strain: Not on file  Food Insecurity: Not on file  Transportation Needs: Not on file  Physical Activity: Not on file  Stress: Not on file  Social Connections: Not on file  Intimate Partner Violence: Not on file    Family History:    Family History  Problem Relation Age of Onset   Hypertension Mother    Thyroid disease Neg Hx    Diabetes Neg Hx      ROS:  Please see the history of present illness.   All other ROS reviewed and negative.     Physical Exam/Data:   Vitals:   09/03/21 1500 09/03/21 1530 09/03/21 1600 09/03/21 1630  BP: (!) 162/105 (!) 159/141 (!) 170/157 (!) 144/115  Pulse: (!) 110 (!) 107 86 (!) 102  Resp: (!) 25 (!) 24 (!) 23 20  Temp:      TempSrc:       SpO2: 93% 94% 98% 97%  Weight:      Height:       No intake or output data in the 24 hours ending 09/03/21 1705    09/03/2021    9:08 AM 07/26/2021    9:29 AM 10/28/2019    8:34 AM  Last 3 Weights  Weight (lbs) 105 lb 130 lb 123 lb 6.4 oz  Weight (kg) 47.628 kg 58.968 kg 55.974 kg     Body mass index is 18.02 kg/m.  General:  elderly female in NAD HEENT: normal Neck: + JVD Vascular: No carotid bruits; Distal pulses 2+ bilaterally Cardiac:  irregular rhythm, tachycardic rate, MR murmur Lungs:  clear to auscultation bilaterally, no wheezing, rhonchi or rales  Abd: soft, nontender, no hepatomegaly  Ext: no edema Musculoskeletal:  No deformities, BUE and BLE strength normal and equal Skin: warm and dry  Neuro:  CNs 2-12 intact, no focal abnormalities noted Psych:  Normal affect   EKG:  The EKG was personally reviewed and demonstrates:  not yet completed Telemetry:  Telemetry was personally reviewed and demonstrates:  Afib with VR in the 100s  Relevant CV Studies:  Echo 07/2021: 1. Left ventricular ejection fraction, by estimation, is 60 to 65%. The  left ventricle has normal function. The left ventricle has no regional  wall motion abnormalities. Left ventricular diastolic function could not  be evaluated.   2. Right ventricular systolic function is normal. The right ventricular  size is normal. There is mildly elevated pulmonary artery systolic  pressure.   3. Left atrial size was severely dilated.   4. Right atrial size was moderately dilated.   5. The mitral valve is normal in structure. Moderate to severe mitral  valve regurgitation. No evidence of mitral stenosis.   6. Tricuspid valve regurgitation is mild to moderate.   7. The aortic valve is tricuspid. Aortic valve regurgitation is mild. No  aortic stenosis is present.   8. The inferior vena cava is normal in size with greater than 50%  respiratory variability, suggesting right atrial pressure of 3 mmHg.    Laboratory Data:  High Sensitivity Troponin:   Recent Labs  Lab 09/03/21 1100  TROPONINIHS 24*     Chemistry Recent Labs  Lab 09/03/21 1100  NA 127*  K 4.6  CL 95*  CO2 21*  GLUCOSE 213*  BUN 21  CREATININE 0.65  CALCIUM 8.9  GFRNONAA >60  ANIONGAP 11    Recent Labs  Lab 09/03/21 1100  PROT 6.2*  ALBUMIN 3.4*  AST 55*  ALT 159*  ALKPHOS 456*  BILITOT 1.5*   Lipids No results for input(s): "CHOL", "TRIG", "HDL", "LABVLDL", "LDLCALC", "CHOLHDL" in the last 168 hours.  Hematology Recent Labs  Lab 09/03/21 1100  WBC 12.5*  RBC 4.10  HGB 12.1  HCT 36.2  MCV 88.3  MCH 29.5  MCHC 33.4  RDW 15.5  PLT 266   Thyroid No results for input(s): "TSH", "FREET4" in the last 168 hours.  BNP Recent Labs  Lab 09/03/21 0935  BNP 1,182.0*    DDimer No results for input(s): "DDIMER" in the last 168 hours.   Radiology/Studies:  CT Chest Wo Contrast  Result Date: 09/03/2021 CLINICAL DATA:  Weakness, fatigue EXAM: CT CHEST WITHOUT CONTRAST TECHNIQUE: Multidetector CT imaging of the chest was performed following the standard protocol without IV contrast. RADIATION DOSE REDUCTION: This exam was performed according to the departmental dose-optimization program which includes automated exposure control, adjustment of the mA and/or kV according to patient size and/or use of iterative reconstruction technique. COMPARISON:  CT 01/12/2021 FINDINGS: Cardiovascular: Heart size is mildly enlarged. No pericardial effusion. Thoracic aorta is nonaneurysmal. Atherosclerotic calcifications of the aorta and coronary arteries. Mediastinum/Nodes: No axillary lymphadenopathy. Redemonstration mildly enlarged mediastinal lymph nodes including 10 mm right paratracheal node (series 2, image 52) and 15 mm precarinal node (series 2, image 61). Evaluation of the hilar structures is limited in the absence of intravenous contrast. Within this limitation, no obvious hilar adenopathy or mass is identified.  Thyroid gland, trachea, and esophagus demonstrate no significant findings. Lungs/Pleura: Small-moderate-sized layering bilateral pleural effusions with associated compressive atelectasis. Extensive biapical pleuroparenchymal scarring. Ground-glass nodular density at the right lung apex measures approximately 2.1 x 1.8 cm (series 7, image 32), previously approximately 1.4 x 1.0 cm. Upper Abdomen: No acute abnormality. Musculoskeletal: Multilevel spondylosis of the thoracic spine and imaged lower cervical spine. Bones appear demineralized. No acute bony findings. No chest wall abnormality. IMPRESSION: 1. Small-moderate-sized layering bilateral pleural effusions with associated compressive atelectasis. 2. Ground-glass nodular density at the right lung apex measuring approximately 2.1 x 1.8 cm, previously measured 1.4 x 1.0 cm. Adenocarcinoma cannot be excluded. PET-CT and/or tissue sampling should be considered with patient's age and functional status taken into consideration. 3. Redemonstration of mildly enlarged mediastinal lymph nodes, which may be reactive. 4. Aortic and coronary artery atherosclerosis. Aortic Atherosclerosis (ICD10-I70.0). Electronically Signed   By: Davina Poke D.O.   On: 09/03/2021 13:15   DG Chest Port 1 View  Result Date: 09/03/2021 CLINICAL DATA:  Shortness of breath. EXAM: PORTABLE CHEST 1 VIEW COMPARISON:  December 19, 2020 FINDINGS: Chronic nodular opacities at the right lung apex, not well assessed on this study. Bibasilar opacities. No visible pleural effusions or pneumothorax. Cardiomediastinal silhouette is unchanged. Polyarticular degenerative change in osteopenia. IMPRESSION: 1. Bibasilar opacities, which could represent atelectasis and/or pneumonia. 2. Chronic nodular opacities at the right lung apex, not well assessed on this study. Please see prior 01/12/2021 CT chest for follow-up recommendations. Electronically Signed   By: Margaretha Sheffield M.D.   On: 09/03/2021 09:44  Assessment and Plan:   Shortness of breath Likely acute diastolic heart failure Moderate to severe MR - BNP 1182 - recent echo with LVEF 82-42%, diastolic function difficult to determine given Afib, but likely diastolic dysfunction - MR newly recognized last admission - chest imaging with bilateral pleural effusions with ground glass nodular density in the right lung apex, can't exclude adenocarcinoma - will obtain limited echo to check EF - feels much better after 20 mg IV lasix x 1 dose - agree with 20 mg IV lasix daily - suspect she will be intolerant of small shifts in volume/mild hypervolemia - if pressure room, consider ARB/MRA - further medication selection pending EF evaluation - if reduced EF, will likely need to stop cardizem, antiarrhythmic options are limited given CAD on chest CT and CHF   Afib RVR Chronic anticoagulation - felt secondary to trauma and hyperthyroidism, although also with moderate to severe MR - received PO cardizem today - will obtain limited echo as above - rates in the 100s - home 100 mg toprol was continued - she has not missed any doses of OAC - avoid amiodarone given thyroid disease - may consider digoxin if rate control needed - will likely improve as volume status and thyroid disease improves - hold off on DCCV for now given thyroid disease, will likely not stay in rhythm until thyroid is controlled   Hyperthyroidism Confirmed in June 2023 during her admission for pelvic fracture - recently increased "thyroid medication"   Aortic and coronary artery atherosclerosis  - seen on CT chest - no prior evaluation - no chest pain   Elevated LFTs AST and ALT mildly elevated Alk phos 456 - question if related to CHF, limited echo as above   Leukocytosis WBC 12.5 UA unremarkable Defer to primary    Risk Assessment/Risk Scores:       New York Heart Association (NYHA) Functional Class NYHA Class IV  CHA2DS2-VASc Score = 6    This indicates a 9.7% annual risk of stroke. The patient's score is based upon: CHF History: 1 HTN History: 1 Diabetes History: 0 Stroke History: 0 Vascular Disease History: 1 Age Score: 2 Gender Score: 1    For questions or updates, please contact [place] Please consult [email or web address] for contact info under    Signed, Ledora Bottcher, [place]  09/03/2021 5:05 PM

## 2021-09-03 NOTE — ED Triage Notes (Signed)
Patient c/o worsening weakness and fatigue since getting home from rehab on 7/3. States she was there after having pelvic fractures. She reports trouble swallowing and decreased appetite.

## 2021-09-03 NOTE — H&P (Signed)
History and Physical    Patient: Emily Robertson:[id number] DOB: 1930/02/18 DOA: 09/03/2021 DOS: the patient was seen and examined on 09/03/2021 PCP: [name], FNP  Patient coming from: Home  Chief Complaint:  Chief Complaint  Patient presents with   Weakness   HPI: Emily Robertson is a 88 y.o. female with medical history significant of a fib, HLD, glaucoma, OA, RA, hyperthyroidism. Presenting with dyspnea. Symptoms started 2 days ago. She seems to be worse at night. She has not had any fever. She has had dry cough and nausea. She has not had vomiting or diarrhea. She reports it has been a little more difficult over the last couple of says to move around d/t dyspnea. She has also had decreased appetite. When her symptoms did not improve this morning, she decided to come to the ED for assistance. She denies any other aggravating or alleviating factors.    Review of Systems: As mentioned in the history of present illness. All other systems reviewed and are negative. Past Medical History:  Diagnosis Date   Arthritis    Glaucoma of both eyes    Hyperlipidemia    Hypertension    Pelvic prolapse    anterior floor   Urgency of urination    Urinary hesitancy    Urticaria    Past Surgical History:  Procedure Laterality Date   PUBOVAGINAL SLING N/A 08/31/2012   Procedure: BOST[employer] SCIENTIFIC UPHOLD LIGHT [place];  Surgeon: Ailene Rud, MD;  Location: [facility];  Service: Urology;  Laterality: N/A;   RETINAL DETACHMENT SURGERY Right    Social History:  reports that she has never smoked. She has never used smokeless tobacco. She reports current alcohol use. She reports that she does not use drugs.  Allergies  Allergen Reactions   Cozaar [Losartan] Swelling    Tongue swelling   Erythromycin Itching   Azopt [Brinzolamide] Rash    Rash on eyelid    Family History  Problem Relation Age of Onset   Hypertension Mother    Thyroid disease Neg  Hx    Diabetes Neg Hx     Prior to Admission medications   Medication Sig Start Date End Date Taking? Authorizing Provider  acetaminophen (TYLENOL) 325 MG tablet Take 2 tablets (650 mg total) by mouth 3 (three) times daily. Patient taking differently: Take 325 mg by mouth daily with supper. 07/31/21  Yes Georgette Shell, MD  alendronate (FOSAMAX) 70 MG tablet Take 70 mg by mouth every Wednesday. 08/10/14  Yes [provider]  apixaban (ELIQUIS) 2.5 MG TABS tablet Take 1 tablet (2.5 mg total) by mouth 2 (two) times daily. 07/31/21  Yes Georgette Shell, MD  Ascorbic Acid (VITAMIN C PO) Take 1 tablet by mouth every morning.   Yes [provider]  atorvastatin (LIPITOR) 20 MG tablet Take 1 tablet (20 mg total) by mouth at bedtime. 07/31/21  Yes Georgette Shell, MD  CALCIUM PO Take 1 tablet by mouth daily with supper.   Yes [provider]  cetirizine (ZYRTEC) 10 MG tablet Take 1 tablet (10 mg total) by mouth 2 (two) times daily. 10/28/19  Yes Padgett, Rae Halsted, MD  Cholecalciferol (VITAMIN D3 PO) Take 1 capsule by mouth daily with supper.   Yes [provider]  diltiazem (CARDIZEM CD) 180 MG 24 hr capsule Take 1 capsule (180 mg total) by mouth daily. 08/01/21  Yes Georgette Shell, MD  EPINEPHrine (EPIPEN) 0.3 mg/0.3 mL SOAJ injection  Inject 0.3 mLs (0.3 mg total) into the muscle once. Patient taking differently: Inject 0.3 mg into the muscle once as needed for anaphylaxis. 12/01/12  Yes Erick Colace, NP  Misc Natural Products (OSTEO BI-FLEX JOINT SHIELD PO) Take 1 capsule by mouth daily with supper.   Yes [provider]  Multiple Vitamin (MULTIVITAMIN WITH MINERALS) TABS tablet Take 1 tablet by mouth every morning.   Yes [provider]  Omega-3 Fatty Acids (FISH OIL) 1000 MG CAPS Take 1,000 mg by mouth every morning.   Yes [provider]  Probiotic Product (PROBIOTIC DAILY PO) Take 1 capsule by mouth daily  with supper.   Yes [provider]  famotidine (PEPCID) 20 MG tablet Take 1 tablet (20 mg total) by mouth 2 (two) times daily. 10/28/19   Kennith Gain, MD  latanoprost (XALATAN) 0.005 % ophthalmic solution Place 1 drop into the left eye at bedtime. 09/14/19   [provider]  leflunomide (ARAVA) 10 MG tablet Take 10 mg by mouth daily. 07/24/21   [provider]  methimazole (TAPAZOLE) 10 MG tablet Take 1 tablet (10 mg total) by mouth 2 (two) times daily. 07/31/21   Georgette Shell, MD  metoprolol succinate (TOPROL-XL) 100 MG 24 hr tablet Take 100 mg by mouth daily. 06/18/21   [provider]  montelukast (SINGULAIR) 10 MG tablet Take 1 tablet (10 mg total) by mouth daily. Patient taking differently: Take 10 mg by mouth every evening. 12/06/19   Kennith Gain, MD  omega-3 acid ethyl esters (LOVAZA) 1 g capsule Take 1 capsule (1 g total) by mouth daily. Patient not taking: Reported on 09/03/2021 08/01/21   Georgette Shell, MD  predniSONE (DELTASONE) 2.5 MG tablet Take 1 tablet (2.5 mg total) by mouth daily with breakfast. 08/01/21   Georgette Shell, MD  timolol (TIMOPTIC) 0.5 % ophthalmic solution Place 1 drop into both eyes 2 (two) times daily.    [provider]  traMADol (ULTRAM) 50 MG tablet 1-2 tablets every 6 hours as needed for pain. May take with acetaminophen. 07/31/21   Georgette Shell, MD    Physical Exam: Vitals:   09/03/21 1330 09/03/21 1400 09/03/21 1500 09/03/21 1530  BP: (!) 186/105 (!) 169/99 (!) 162/105 (!) 159/141  Pulse: 98 97 (!) 110 (!) 107  Resp: (!) 21 19 (!) 25 (!) 24  Temp:  98.5 F (36.9 C)    TempSrc:  Oral    SpO2: 96% 93% 93% 94%  Weight:      Height:       General: 88 y.o. female resting in bed in NAD Eyes: PERRL, normal sclera ENMT: Nares patent w/o discharge, orophaynx clear, dentition normal, ears w/o discharge/lesions/ulcers Neck: Supple, trachea midline Cardiovascular:  tachy, +S1, S2, no m/g/r, equal pulses throughout Respiratory: decreased at bases, no w/r/r, slightly increased WOB on RA GI: BS+, NDNT, no masses noted, no organomegaly noted MSK: No e/c/c Neuro: A&O x 3, no focal deficits Psyc: Appropriate interaction and affect, calm/cooperative  Data Reviewed:  Lab Results  Component Value Date   NA 127 (L) 09/03/2021   K 4.6 09/03/2021   CO2 21 (L) 09/03/2021   GLUCOSE 213 (H) 09/03/2021   BUN 21 09/03/2021   CREATININE 0.65 09/03/2021   CALCIUM 8.9 09/03/2021   GFRNONAA >60 09/03/2021   Alk phos 456 AST  55 ALT  159 T bili  1.5 Trp  24 BNP  1,182  Lab Results  Component Value Date  WBC 12.5 (H) 09/03/2021   HGB 12.1 09/03/2021   HCT 36.2 09/03/2021   MCV 88.3 09/03/2021   PLT 266 09/03/2021   CT chest w/o 1. Small-moderate-sized layering bilateral pleural effusions with associated compressive atelectasis. 2. Ground-glass nodular density at the right lung apex measuring approximately 2.1 x 1.8 cm, previously measured 1.4 x 1.0 cm. Adenocarcinoma cannot be excluded. PET-CT and/or tissue sampling should be considered with patient's age and functional status taken into consideration. 3. Redemonstration of mildly enlarged mediastinal lymph nodes, which may be reactive. 4. Aortic and coronary artery atherosclerosis.  EKG pending  Assessment and Plan: Dyspnea Elevated BNP     - admit to inpt, progressive     - continue diuresis as lasix 65m IV BID     - check daily wts/I&O     - possible HF, rpt echo     - check EKG     - initial trp mildly elevated, likely demand ischemia, no chest pain, trend  HTN     - continue home regimen  A fib on eliquis     - continue home regimen  Hyponatremia     - ?from HF?     - check UNa+, Uosm     - q6h renal function panel     - will diurese and follow  Elevated LFTs     - check hepatitis panel     - check RUQ [place]     - check fractionated bili     - hold  statin  Leukocytosis     - ?reactive?     - check procal and trend for now  Hyperthyroidism     - continue home regimen  Glaucoma     - continue home regimen  HLD     - hold statin; continue home regimen otherwise  RA     - continue home regimen  Hyperglycemia     - check A1c  Pleural effusions     - let's see if diuresis helps first     - may ultimately need thoracentesis  Lung nodule     - as seen on CT     - outpt follow up  Advance Care Planning:   Code Status: FULL  Consults: Cardiology  Family Communication: w/ son at bedside  Severity of Illness: The appropriate patient status for this patient is INPATIENT. Inpatient status is judged to be reasonable and necessary in order to provide the required intensity of service to ensure the patient's safety. The patient's presenting symptoms, physical exam findings, and initial radiographic and laboratory data in the context of their chronic comorbidities is felt to place them at high risk for further clinical deterioration. Furthermore, it is not anticipated that the patient will be medically stable for discharge from the hospital within 2 midnights of admission.   * I certify that at the point of admission it is my clinical judgment that the patient will require inpatient hospital care spanning beyond 2 midnights from the point of admission due to high intensity of service, high risk for further deterioration and high frequency of surveillance required.*  Author: TJonnie Finner DO 09/03/2021 3:40 PM  For on call review [email or web address]

## 2021-09-04 ENCOUNTER — Inpatient Hospital Stay (HOSPITAL_COMMUNITY): Payer: Medicare PPO

## 2021-09-04 DIAGNOSIS — I5031 Acute diastolic (congestive) heart failure: Secondary | ICD-10-CM

## 2021-09-04 DIAGNOSIS — J90 Pleural effusion, not elsewhere classified: Secondary | ICD-10-CM | POA: Diagnosis not present

## 2021-09-04 DIAGNOSIS — R911 Solitary pulmonary nodule: Secondary | ICD-10-CM | POA: Diagnosis not present

## 2021-09-04 DIAGNOSIS — I10 Essential (primary) hypertension: Secondary | ICD-10-CM | POA: Diagnosis not present

## 2021-09-04 DIAGNOSIS — I4891 Unspecified atrial fibrillation: Secondary | ICD-10-CM | POA: Diagnosis not present

## 2021-09-04 DIAGNOSIS — I480 Paroxysmal atrial fibrillation: Secondary | ICD-10-CM | POA: Diagnosis not present

## 2021-09-04 DIAGNOSIS — R7989 Other specified abnormal findings of blood chemistry: Secondary | ICD-10-CM

## 2021-09-04 LAB — ECHOCARDIOGRAM LIMITED
Calc EF: 46.9 %
Height: 64 in
MV M vel: 4.52 m/s
MV Peak grad: 81.5 mmHg
Radius: 0.7 cm
S' Lateral: 2.8 cm
Single Plane A2C EF: 48 %
Single Plane A4C EF: 46.9 %
Weight: 1696.66 oz

## 2021-09-04 LAB — CBC
HCT: 36.2 % (ref 36.0–46.0)
Hemoglobin: 12 g/dL (ref 12.0–15.0)
MCH: 29.9 pg (ref 26.0–34.0)
MCHC: 33.1 g/dL (ref 30.0–36.0)
MCV: 90.3 fL (ref 80.0–100.0)
Platelets: 236 10*3/uL (ref 150–400)
RBC: 4.01 MIL/uL (ref 3.87–5.11)
RDW: 15.9 % — ABNORMAL HIGH (ref 11.5–15.5)
WBC: 11.9 10*3/uL — ABNORMAL HIGH (ref 4.0–10.5)
nRBC: 0 % (ref 0.0–0.2)

## 2021-09-04 LAB — PROTIME-INR
INR: 1.1 (ref 0.8–1.2)
Prothrombin Time: 14.5 seconds (ref 11.4–15.2)

## 2021-09-04 LAB — HEPATITIS PANEL, ACUTE
HCV Ab: NONREACTIVE
Hep A IgM: NONREACTIVE
Hep B C IgM: NONREACTIVE
Hepatitis B Surface Ag: NONREACTIVE

## 2021-09-04 LAB — RENAL FUNCTION PANEL
Albumin: 3.2 g/dL — ABNORMAL LOW (ref 3.5–5.0)
Albumin: 3.1 g/dL — ABNORMAL LOW (ref 3.5–5.0)
Anion gap: 13 (ref 5–15)
Anion gap: 11 (ref 5–15)
BUN: 17 mg/dL (ref 8–23)
BUN: 17 mg/dL (ref 8–23)
CO2: 23 mmol/L (ref 22–32)
CO2: 28 mmol/L (ref 22–32)
Calcium: 8.6 mg/dL — ABNORMAL LOW (ref 8.9–10.3)
Calcium: 8.9 mg/dL (ref 8.9–10.3)
Chloride: 95 mmol/L — ABNORMAL LOW (ref 98–111)
Chloride: 96 mmol/L — ABNORMAL LOW (ref 98–111)
Creatinine, Ser: 0.76 mg/dL (ref 0.44–1.00)
Creatinine, Ser: 0.87 mg/dL (ref 0.44–1.00)
GFR, Estimated: >60 mL/min (ref >60)
GFR, Estimated: >60 mL/min (ref >60)
Glucose, Bld: 119 mg/dL — ABNORMAL HIGH (ref 70–99)
Glucose, Bld: 105 mg/dL — ABNORMAL HIGH (ref 70–99)
Phosphorus: 3.3 mg/dL (ref 2.5–4.6)
Phosphorus: 3.2 mg/dL (ref 2.5–4.6)
Potassium: 4.2 mmol/L (ref 3.5–5.1)
Potassium: 3.6 mmol/L (ref 3.5–5.1)
Sodium: 131 mmol/L — ABNORMAL LOW (ref 135–145)
Sodium: 135 mmol/L (ref 135–145)

## 2021-09-04 LAB — HEMOGLOBIN A1C
Hgb A1c MFr Bld: 5.7 % — ABNORMAL HIGH (ref 4.8–5.6)
Mean Plasma Glucose: 116.89 mg/dL

## 2021-09-04 MED ORDER — ENSURE ENLIVE PO LIQD
237.0000 mL | Freq: Three times a day (TID) | ORAL | Status: DC
  Administered 2021-09-04 – 2021-09-06 (×5): 237 mL via ORAL

## 2021-09-04 MED ORDER — SODIUM CHLORIDE 0.9 % IV SOLN
INTRAVENOUS | Status: DC
Start: 2021-09-05 — End: 2021-09-05

## 2021-09-04 MED ORDER — METOPROLOL SUCCINATE ER 50 MG PO TB24
150.0000 mg | ORAL_TABLET | Freq: Every morning | ORAL | Status: DC
  Administered 2021-09-05 – 2021-09-06 (×2): 150 mg via ORAL
  Filled 2021-09-04 (×2): qty 1

## 2021-09-04 NOTE — Progress Notes (Signed)
  Echocardiogram 2D Echocardiogram has been performed.  Janalyn Harder 09/04/2021, 11:37 AM

## 2021-09-04 NOTE — Discharge Instructions (Signed)
High Calorie, High Protein Nutrition Therapy  A high-calorie, high-protein diet has been recommended to you. Your registered dietitian nutritionist (RDN) may have recommended this diet because you are having difficulty eating enough calories throughout the day, you have lost weight, and/or you need to add protein to your diet.  Sometimes you may not feel like eating, even if you know the importance of good nutrition. The recommendations in this handout can help you with the following:  Regaining your strength and energy Keeping your body healthy Healing and recovering from surgery or illness and fighting infection  Schedule Your Meals and Snacks  Several small meals and snacks are often better tolerated and digested than large meals.  Strategies  Plan to eat 3 meals and 3 snacks daily. Experiment with timing meals to find out when you have a larger appetite. Appetite may be greatest in the morning after not eating all night so you may prefer to eat your larger meals and snacks in the morning and at lunch. Breakfast-type foods are often better tolerated so eat foods such as eggs, pancakes, waffles and cereal for any meal or snack. Carry snacks with you so you are prepared to eat every 2 to 3 hours. Determine what works best for you if your body's cues for feeling hungry or full are not working. Eat a small meal or snack even if you don't feel hungry. Set a timer to remind you when it is time to eat. Take a walk before you eat (with health care provider's approval). Light or moderate physical activity can help you maintain muscle and increase your appetite.  Make Eating Enjoyable  Taking steps to make the experience enjoyable may help to increase your interest in eating and improve your appetite.  Strategies:  Eat with others whenever possible. Include your favorite foods to make meals more enjoyable. Try new foods. Save your beverage for the end of the meal so that you have more  room for food before you get full.  Add Calories to Your Meals and Snacks  Try adding calorie-dense foods so that each bite provides more nutrition.  Strategies  Drink milk, chocolate milk, soy milk, or smoothies instead of low-calorie beverages such as diet drinks or water. Cook with milk or soy milk instead of water when making dishes such as hot cereal, cocoa, or pudding. Add jelly, jam, honey, butter or margarine to bread and crackers. Add jam or fruit to ice cream and as a topping over cake. Mix dried fruit, nuts, granola, honey, or dry cereal with yogurt or hot cereals. Enjoy snacks such as milkshakes, smoothies, pudding, ice cream, or custard. Blend a fruit smoothie of a banana, frozen berries, milk or soy milk, and 1 tablespoon nonfat powdered milk or protein powder.  Add Protein to Your Meals and Snacks  Choose at least one protein food at each meal and snack to increase your daily intake.  Strategies  Add  cup nonfat dry milk powder or protein powder to make a high-protein milk to drink or to use in recipes that call for milk. Vanilla or peppermint extract or unsweetened cocoa powder could help to boost the flavor. Add hard-cooked eggs, leftover meat, grated cheese, canned beans or tofu to noodles, rice, salads, sandwiches, soups, casseroles, pasta, tuna and other mixed dishes. Add powdered milk or protein powder to hot cereals, meatloaf, casseroles, scrambled eggs, sauces, cream soups, and shakes. Add beans and lentils to salads, soups, casseroles, and vegetable dishes. Eat cottage cheese or yogurt, especially   Greek yogurt, with fruit as a snack or dessert. Eat peanut or other nut butters on crackers, bread, toast, waffles, apples, bananas or celery sticks. Add it to milkshakes, smoothies, or desserts. Consider a ready-made protein shake.   Add Fats to Your Meals and Snacks  Try adding fats to your meals and snacks. Fat provides more calories in fewer bites than  carbohydrate or protein and adds flavors to your foods.  Strategies  Snack on nuts and seeds or add them to foods like salads, pasta, cereals, yogurt, and ice cream.  Saut or stir-fry vegetables, meats, chicken, fish or tofu in olive or canola oil.  Add olive oil, other vegetable oils, butter or margarine to soups, vegetables, potatoes, cooked cereal, rice, pasta, bread, crackers, pancakes, or waffles. Snack on olives or add to pasta, pizza, or salad. Add avocado or guacamole to your salads, sandwiches, and other entrees. Include fatty fish such as salmon in your weekly meal plan.  Tips For Adding Protein Nutrition Therapy    Tips Add extra egg to one or more meals  Increase the portion of milk to drink and change to skim milk if able  Include Greek yogurt or cottage cheese for snack or part of a meal  Increase portion size of protein entre and decrease portion of starch/bread  Mix protein powder, nut butter, almond/nut milk, non-fat dry milk, or Greek yogurt to shakes and smoothies  Use these ingredients also in baked goods or other recipes Use double the amount of sandwich filling  Add protein foods to all snacks including cheese, nut butters, milk and yogurt  Food Tips for Including Protein  Beans Cook and use dried peas, beans, and tofu in soups or add to casseroles, pastas, and grain dishes that also contain cheese or meat  Mash with cheese and milk  Use tofu to make smoothies  Commercial Protein Supplements Use nutritional supplements or protein powder sold at pharmacies and grocery stores  Use protein powder in milk drinks and desserts, such as pudding  Mix with ice cream, milk, and fruit or other flavorings for a high-protein milkshake  Cottage Cheese or Ricotta Cheese Mix with or use to stuff fruits and vegetables  Add to casseroles, spaghetti, noodles, or egg dishes such as omelets, scrambled eggs, and souffls  Use gelatin, pudding-type desserts, cheesecake, and pancake  or waffle batter  Use to stuff crepes, pasta shells, or manicotti  Puree and use as a substitute for sour cream  Eggs, Egg whites, and Egg Yolks Add chopped, hard-cooked eggs to salads and dressings, vegetables, casseroles, and creamed meats  Beat eggs into mashed potatoes, vegetable purees, and sauces  Add extra egg whites to quiches, scrambled eggs, custards, puddings, pancake batter, or French toast wash/batter  Make a rich custard with egg yolks, double strength milk, and sugar  Add extra hard-cooked yolks to deviled egg filling and sandwich spreads  Hard or Semi-Soft Cheese (Cheddar, Jack, Brick) Melt on sandwiches, bread, muffins, tortillas, hamburgers, hot dogs, other meats or fish, vegetables, eggs, or desserts such as stewed figs or pies  Grate and add to soups, sauces, casseroles, vegetable dishes, potatoes, rice noodles, or meatloaf  Serve as a snack with crackers or bagels  Ice cream, Yogurt, and Frozen Yogurt Add to milk drinks such as milkshakes  Add to cereals, fruits, gelatin desserts, and pies  Blend or whip with soft or cooked fruits  Sandwich ice cream or frozen yogurt between enriched cake slices, cookies, or graham crackers  Use seasoned   yogurt as a dip for fruits, vegetables, or chips  Use yogurt in place of sour cream in casseroles  Meat and Fish Add chopped, cooked meat or fish to vegetables, salads, casseroles, soups, sauces, and biscuit dough  Use in omelets, souffls, quiches, and sandwich fillings  Add chicken and turkey to stuffing  Wrap in pie crust or biscuit dough as turnovers  Add to stuffed baked potatoes  Add pureed meat to soups  Milk Use in beverages and in cooking  Use in preparing foods, such as hot cereal, soups, cocoa, or pudding  Add cream sauces to vegetable and other dishes  Use evaporated milk, evaporated skim milk, or sweetened condensed milk instead of milk or water in recipes.  Nonfat Dry Milk Add 1/3 cup of nonfat dry milk powdered milk to  each cup of regular milk for "double strength" milk  Add to yogurt and milk drinks, such as pasteurized eggnog and milkshakes  Add to scrambled eggs and mashed potatoes  Use in casseroles, meatloaf, hot cereal, breads, muffins, sauces, cream soups, puddings and custards, and other milk-based desserts  Nuts, Seeds, and Wheat Germ Add to casseroles, breads, muffins, pancakes, cookies, and waffles  Sprinkle on fruit, cereal, ice cream, yogurt, vegetables, salads, and toast as a crunchy topping  Use in place of breadcrumbs  Blend with parsley or spinach, herbs, and cream for a noodle, pasta, or vegetable sauce.  Roll banana in chopped nuts  Peanut Butter Spread on sandwiches, toast, muffins, crackers, waffles, pancakes, and fruit slices  Use as a dip for raw vegetables, such as carrots, cauliflower, and celery  Blend with milk drinks, smoothies, and other beverages  Swirl through soft ice cream or yogurt  Spread on a banana then roll in crushed, dry cereal or chopped nuts   Small Meal and Snack Ideas  These snacks and meals are recommended when you have to eat but aren't necessarily hungry.  They are good choices because they are high in protein and high in calories.   2 graham crackers, 2 tablespoons peanut or other nut butter, 1 cup milk  cup Greek yogurt,  cup fruit,  cup granola 2 deviled egg halves, 5 whole wheat crackers 1 cup cream of tomato soup,  grilled cheese sandwich 1 toasted waffle topped with: 2 tablespoons peanut or nut butter, 1 tablespoon jam Trail mix made with:  cup nuts,  cup dried fruit,  cup cold cereal, any variety  cup oatmeal or cream of wheat cereal, 1 tablespoon peanut or nut butter,  cup diced fruit  High-Calorie, High-Protein Sample 1-Day Menu   Breakfast 1 egg, scrambled 1 ounce cheddar cheese 1 English muffin, whole wheat 1 tablespoon margarine 1 tablespoon jam  cup orange juice, fortified with calcium and vitamin D  Morning Snack 1  tablespoon peanut butter 1 banana 1 cup 1% milk  Lunch Tuna salad sandwich made with: 2 slices bread, whole wheat 3 ounces tuna mixed with: 1 tablespoon mayonnaise  cup pudding  Afternoon Snack  cup hummus  cup carrots 1 pita  Evening Meal Enchilada casserole made with: 2 corn tortillas 3 ounces ground beef, cooked  cup black beans, cooked  cup corn, cooked 1 ounce grated cheddar cheese  cup enchilada sauce  avocado, sliced, topping for enchilada 1 tablespoon sour cream, topping for enchilada Salad:  cup lettuce, shredded  cup tomatoes, chopped, for salad 1 tablespoon olive oil and vinegar dressing, for salad  Evening Snack  cup Greek yogurt  cup blueberries  cup granola    Copyright 2020  [employer]. All rights reserved    Additional Discharge Instructions   Please get your medications reviewed and adjusted by your Primary MD.  Please request your Primary MD to go over all Hospital Tests and Procedure/Radiological results at the follow up, please get all Hospital records sent to your Prim MD by signing hospital release before you go home.  If you had Pneumonia of Lung problems at the Hospital: Please get a 2 view Chest X ray done in approximately 4 weeks after hospital discharge or sooner if instructed by your Primary MD.  If you have Congestive Heart Failure: Please call your Cardiologist or Primary MD anytime you have any of the following symptoms:  1) 3 pound weight gain in 24 hours or 5 pounds in 1 week  2) shortness of breath, with or without a dry hacking cough  3) swelling in the hands, feet or stomach  4) if you have to sleep on extra pillows at night in order to breathe  Follow cardiac low salt diet and 1.5 lit/day fluid restriction.  If you have diabetes Accuchecks 4 times/day, Once in AM empty stomach and then before each meal. Log in all results and show them to your primary doctor at your next visit. If any  glucose reading is under 80 or above 300 call your primary MD immediately.  If you have Seizure/Convulsions/Epilepsy: Please do not drive, operate heavy machinery, participate in activities at heights or participate in high speed sports until you have seen by Primary MD or a Neurologist and advised to do so again. Per [place] DMV statutes, patients with seizures are not allowed to drive until they have been seizure-free for six months.  Use caution when using heavy equipment or power tools. Avoid working on ladders or at heights. Take showers instead of baths. Ensure the water temperature is not too high on the home water heater. Do not go swimming alone. Do not lock yourself in a room alone (i.e. bathroom). When caring for infants or small children, sit down when holding, feeding, or changing them to minimize risk of injury to the child in the event you have a seizure. Maintain good sleep hygiene. Avoid alcohol.   If you had Gastrointestinal Bleeding: Please ask your Primary MD to check a complete blood count within one week of discharge or at your next visit. Your endoscopic/colonoscopic biopsies that are pending at the time of discharge, will also need to followed by your Primary MD.  Get Medicines reviewed and adjusted. Please take all your medications with you for your next visit with your Primary MD  Please request your Primary MD to go over all hospital tests and procedure/radiological results at the follow up, please ask your Primary MD to get all Hospital records sent to his/her office.  If you experience worsening of your admission symptoms, develop shortness of breath, life threatening emergency, suicidal or homicidal thoughts you must seek medical attention immediately by calling 911 or calling your MD immediately  if symptoms less severe.  You must read complete instructions/literature along with all the possible adverse reactions/side effects for all the Medicines you take and  that have been prescribed to you. Take any new Medicines after you have completely understood and accpet all the possible adverse reactions/side effects.   Do not drive or operate heavy machinery when taking Pain medications.   Do not take more than prescribed Pain, Sleep and Anxiety Medications  Special Instructions: If you have   smoked or chewed Tobacco  in the last 2 yrs please stop smoking, stop any regular Alcohol  and or any Recreational drug use.  Wear Seat belts while driving.  Please note You were cared for by a hospitalist during your hospital stay. If you have any questions about your discharge medications or the care you received while you were in the hospital after you are discharged, you can call the unit and asked to speak with the hospitalist on call if the hospitalist that took care of you is not available. Once you are discharged, your primary care physician will handle any further medical issues. Please note that NO REFILLS for any discharge medications will be authorized once you are discharged, as it is imperative that you return to your primary care physician (or establish a relationship with a primary care physician if you do not have one) for your aftercare needs so that they can reassess your need for medications and monitor your lab values.  You can reach the hospitalist office at phone [phone] or fax [phone]   If you do not have a primary care physician, you can call [phone] for a physician referral.   

## 2021-09-04 NOTE — Plan of Care (Signed)
  Problem: Education: Goal: Knowledge of General Education information will improve Description: Including pain rating scale, medication(s)/side effects and non-pharmacologic comfort measures Outcome: Progressing   Problem: Health Behavior/Discharge Planning: Goal: Ability to manage health-related needs will improve Outcome: Progressing   Problem: Clinical Measurements: Goal: Ability to maintain clinical measurements within normal limits will improve Outcome: Progressing Goal: Will remain free from infection Outcome: Progressing Goal: Diagnostic test results will improve Outcome: Progressing Goal: Respiratory complications will improve Outcome: Progressing Goal: Cardiovascular complication will be avoided Outcome: Progressing   Problem: Nutrition: Goal: Adequate nutrition will be maintained Outcome: Progressing   Problem: Coping: Goal: Level of anxiety will decrease Outcome: Progressing   Problem: Elimination: Goal: Will not experience complications related to bowel motility Outcome: Progressing Goal: Will not experience complications related to urinary retention Outcome: Progressing   Problem: Pain Managment: Goal: General experience of comfort will improve Outcome: Progressing   Problem: Safety: Goal: Ability to remain free from injury will improve Outcome: Progressing   Problem: Skin Integrity: Goal: Risk for impaired skin integrity will decrease Outcome: Progressing   Problem: Education: Goal: Ability to demonstrate management of disease process will improve Outcome: Progressing Goal: Ability to verbalize understanding of medication therapies will improve Outcome: Progressing   Problem: Activity: Goal: Capacity to carry out activities will improve Outcome: Progressing   Problem: Cardiac: Goal: Ability to achieve and maintain adequate cardiopulmonary perfusion will improve Outcome: Progressing   Problem: Education: Goal: Individualized Educational  Video(s) Outcome: Not Applicable

## 2021-09-04 NOTE — Progress Notes (Signed)
PROGRESS NOTE    Emily Robertson  FUX:[id number] DOB: 1930-11-04 DOA: 09/03/2021 PCP: Rogers Blocker, FNP   Brief Narrative:  HPI: Emily Robertson is a 88 y.o. female with medical history significant of a fib, HLD, glaucoma, OA, RA, hyperthyroidism. Presenting with dyspnea. Symptoms started 2 days ago. She seems to be worse at night. She has not had any fever. She has had dry cough and nausea. She has not had vomiting or diarrhea. She reports it has been a little more difficult over the last couple of says to move around d/t dyspnea. She has also had decreased appetite. When her symptoms did not improve this morning, she decided to come to the ED for assistance. She denies any other aggravating or alleviating factors.    Assessment & Plan:   Active Problems:   HTN (hypertension)   Hyperthyroidism   Osteoporosis   Rheumatoid arthritis (HCC)   Paroxysmal atrial fibrillation (HCC)   Dyspnea   Hyponatremia   Elevated LFTs   Leukocytosis   Glaucoma   Lung nodule   Pleural effusion  Acute on chronic congestive heart failure with preserved ejection fraction/moderate to severe MR: BNP 1182.  Recent echo showed 60 to 65% ejection fraction.  Chest x-ray this time with bilateral pleural effusion and groundglass nodular opacity in the right lung apex, cannot exclude adenocarcinoma.  She received 1 dose of IV Lasix yesterday.  This is continued.  Cardiology is on board and managing.  They are obtaining limited echo.  Patient is feeling much better today.  She is not hypoxic.  Lungs clear to auscultation.  Management per cardiology.  Paroxysmal atrial fibrillation with RVR: Patient was recently diagnosed with A-fib during her recent hospitalization last month.  She is on Eliquis as well.  Her heart rate appears to be controlled at current regimen.  Cardiology on board and management per them.  Hypothyroidism: Confirmed in June 2023 during recent hospitalization due to pelvic fracture.  On  methimazole.  Elevated LFTs: Ultrasound abdomen unremarkable.  Viral hepatitis panel negative as well.  Repeat labs in the morning.  Leukocytosis: Nonspecific.  No signs of infection.  Improving.  Acute hyponatremia: Work-up indicates SIADH, could be due to lung nodule/pathology.  Hyponatremia resolved.  Hyperlipidemia: Hold statin due to elevated LFTs.  Rheumatoid arthritis: Continue home regimen.  DVT prophylaxis: apixaban (ELIQUIS) tablet 2.5 mg Start: 09/03/21 2200   Code Status: Full Code  Family Communication:  None present at bedside.  Plan of care discussed with patient in length and he/she verbalized understanding and agreed with it.  Status is: Inpatient Remains inpatient appropriate because: Needs further management for CHF   Estimated body mass index is 18.2 kg/m as calculated from the following:   Height as of this encounter: 5\' 4"  (1.626 m).   Weight as of this encounter: 48.1 kg.    Nutritional Assessment: Body mass index is 18.2 kg/m. Seen by dietician.  I agree with the assessment and plan as outlined below: Nutrition Status:        . Skin Assessment: I have examined the patient's skin and I agree with the wound assessment as performed by the wound care RN as outlined below:    Consultants:  Cardiology  Procedures:  None  Antimicrobials:  Anti-infectives (From admission, onward)    None         Subjective: Patient seen and examined.  She states that she is feeling much better than yesterday, no shortness of breath or weakness.  No  other complaint.  Objective: Vitals:   09/04/21 0500 09/04/21 0523 09/04/21 0600 09/04/21 0700  BP:  114/82    Pulse:  (!) 107    Resp:  18 (!) 22 18  Temp:  98.2 F (36.8 C)    TempSrc:  Oral    SpO2:  99%    Weight: 48.1 kg     Height:        Intake/Output Summary (Last 24 hours) at 09/04/2021 1037 Last data filed at 09/04/2021 0506 Gross per 24 hour  Intake 30 ml  Output 1900 ml  Net -1870 ml    Filed Weights   09/03/21 0908 09/03/21 2105 09/04/21 0500  Weight: 47.6 kg 48.1 kg 48.1 kg    Examination:  General exam: Appears calm and comfortable  Respiratory system: Clear to auscultation. Respiratory effort normal. Cardiovascular system: S1 & S2 heard, RRR. No JVD, murmurs, rubs, gallops or clicks. No pedal edema. Gastrointestinal system: Abdomen is nondistended, soft and nontender. No organomegaly or masses felt. Normal bowel sounds heard. Central nervous system: Alert and oriented. No focal neurological deficits. Extremities: Symmetric 5 x 5 power. Skin: No rashes, lesions or ulcers Psychiatry: Judgement and insight appear normal. Mood & affect appropriate.    Data Reviewed: I have personally reviewed following labs and imaging studies  CBC: Recent Labs  Lab 09/03/21 1100 09/04/21 0018  WBC 12.5* 11.9*  NEUTROABS 10.9*  --   HGB 12.1 12.0  HCT 36.2 36.2  MCV 88.3 90.3  PLT 266 236   Basic Metabolic Panel: Recent Labs  Lab 09/03/21 1100 09/03/21 2111 09/04/21 0018 09/04/21 0430  NA 127* 131* 131* 135  K 4.6 4.0 4.2 3.6  CL 95* 92* 95* 96*  CO2 21* 28 23 28   GLUCOSE 213* 169* 119* 105*  BUN 21 19 17 17   CREATININE 0.65 0.93 0.76 0.87  CALCIUM 8.9 9.1 8.6* 8.9  PHOS  --  3.2 3.3 3.2   GFR: Estimated Creatinine Clearance: 33.3 mL/min (by C-G formula based on SCr of 0.87 mg/dL). Liver Function Tests: Recent Labs  Lab 09/03/21 1100 09/03/21 2111 09/04/21 0018 09/04/21 0430  AST 55*  --   --   --   ALT 159*  --   --   --   ALKPHOS 456*  --   --   --   BILITOT 1.5* 1.2  --   --   PROT 6.2*  --   --   --   ALBUMIN 3.4* 3.9 3.2* 3.1*   No results for input(s): "LIPASE", "AMYLASE" in the last 168 hours. No results for input(s): "AMMONIA" in the last 168 hours. Coagulation Profile: No results for input(s): "INR", "PROTIME" in the last 168 hours. Cardiac Enzymes: No results for input(s): "CKTOTAL", "CKMB", "CKMBINDEX", "TROPONINI" in the last 168  hours. BNP (last 3 results) No results for input(s): "PROBNP" in the last 8760 hours. HbA1C: Recent Labs    09/03/21 2111  HGBA1C 5.7*   CBG: No results for input(s): "GLUCAP" in the last 168 hours. Lipid Profile: No results for input(s): "CHOL", "HDL", "LDLCALC", "TRIG", "CHOLHDL", "LDLDIRECT" in the last 72 hours. Thyroid Function Tests: No results for input(s): "TSH", "T4TOTAL", "FREET4", "T3FREE", "THYROIDAB" in the last 72 hours. Anemia Panel: No results for input(s): "VITAMINB12", "FOLATE", "FERRITIN", "TIBC", "IRON", "RETICCTPCT" in the last 72 hours. Sepsis Labs: Recent Labs  Lab 09/03/21 2111  PROCALCITON 0.10    No results found for this or any previous visit (from the past 240 hour(s)).   Radiology  Studies: US Abdomen Limited RUQ (LIVER/GB)  Result Date: 09/03/2021 CLINICAL DATA:  Elevated liver function studies EXAM: ULTRASOUND ABDOMEN LIMITED RIGHT UPPER QUADRANT COMPARISON:  None Available. FINDINGS: Gallbladder: No gallstones or wall thickening visualized. No sonographic Murphy sign noted by sonographer. Common bile duct: Diameter: 3 mm, normal Liver: No focal lesion identified. Within normal limits in parenchymal echogenicity. Portal vein is patent on color Doppler imaging with normal direction of blood flow towards the liver. Other: A small right pleural effusion is identified. IMPRESSION: 1. No evidence of cholelithiasis or acute cholecystitis. 2. Right pleural effusion. Electronically Signed   By: Burman Nieves M.D.   On: 09/03/2021 17:49   CT Chest Wo Contrast  Result Date: 09/03/2021 CLINICAL DATA:  Weakness, fatigue EXAM: CT CHEST WITHOUT CONTRAST TECHNIQUE: Multidetector CT imaging of the chest was performed following the standard protocol without IV contrast. RADIATION DOSE REDUCTION: This exam was performed according to the departmental dose-optimization program which includes automated exposure control, adjustment of the mA and/or kV according to patient  size and/or use of iterative reconstruction technique. COMPARISON:  CT 01/12/2021 FINDINGS: Cardiovascular: Heart size is mildly enlarged. No pericardial effusion. Thoracic aorta is nonaneurysmal. Atherosclerotic calcifications of the aorta and coronary arteries. Mediastinum/Nodes: No axillary lymphadenopathy. Redemonstration mildly enlarged mediastinal lymph nodes including 10 mm right paratracheal node (series 2, image 52) and 15 mm precarinal node (series 2, image 61). Evaluation of the hilar structures is limited in the absence of intravenous contrast. Within this limitation, no obvious hilar adenopathy or mass is identified. Thyroid gland, trachea, and esophagus demonstrate no significant findings. Lungs/Pleura: Small-moderate-sized layering bilateral pleural effusions with associated compressive atelectasis. Extensive biapical pleuroparenchymal scarring. Ground-glass nodular density at the right lung apex measures approximately 2.1 x 1.8 cm (series 7, image 32), previously approximately 1.4 x 1.0 cm. Upper Abdomen: No acute abnormality. Musculoskeletal: Multilevel spondylosis of the thoracic spine and imaged lower cervical spine. Bones appear demineralized. No acute bony findings. No chest wall abnormality. IMPRESSION: 1. Small-moderate-sized layering bilateral pleural effusions with associated compressive atelectasis. 2. Ground-glass nodular density at the right lung apex measuring approximately 2.1 x 1.8 cm, previously measured 1.4 x 1.0 cm. Adenocarcinoma cannot be excluded. PET-CT and/or tissue sampling should be considered with patient's age and functional status taken into consideration. 3. Redemonstration of mildly enlarged mediastinal lymph nodes, which may be reactive. 4. Aortic and coronary artery atherosclerosis. Aortic Atherosclerosis (ICD10-I70.0). Electronically Signed   By: Duanne Guess D.O.   On: 09/03/2021 13:15   DG Chest Port 1 View  Result Date: 09/03/2021 CLINICAL DATA:  Shortness  of breath. EXAM: PORTABLE CHEST 1 VIEW COMPARISON:  December 19, 2020 FINDINGS: Chronic nodular opacities at the right lung apex, not well assessed on this study. Bibasilar opacities. No visible pleural effusions or pneumothorax. Cardiomediastinal silhouette is unchanged. Polyarticular degenerative change in osteopenia. IMPRESSION: 1. Bibasilar opacities, which could represent atelectasis and/or pneumonia. 2. Chronic nodular opacities at the right lung apex, not well assessed on this study. Please see prior 01/12/2021 CT chest for follow-up recommendations. Electronically Signed   By: Feliberto Harts M.D.   On: 09/03/2021 09:44    Scheduled Meds:  apixaban  2.5 mg Oral BID   diltiazem  180 mg Oral Daily   famotidine  20 mg Oral BID   furosemide  20 mg Intravenous Daily   latanoprost  1 drop Left Eye QHS   leflunomide  10 mg Oral q morning   loratadine  10 mg Oral Daily   methimazole  10  mg Oral BID   metoprolol succinate  100 mg Oral q morning   montelukast  10 mg Oral QPM   multivitamin with minerals  1 tablet Oral q morning   omega-3 acid ethyl esters  1 g Oral Daily   predniSONE  2.5 mg Oral Q breakfast   timolol  1 drop Both Eyes BID   Continuous Infusions:   LOS: 1 day   Hughie Closs, MD [facility]  09/04/2021, 10:37 AM   *Please note that this is a verbal dictation therefore any spelling or grammatical errors are due to the "[employer]agon [employer] One" system interpretation.  Please page via Amion and do not message via secure chat for urgent patient care matters. Secure chat can be used for non urgent patient care matters.  How to contact the [facility] Attending or Consulting provider 7A - 7P or covering provider during after hours 7P -7A, for this patient?  Check the care team in [facility] and look for a) attending/consulting TR[facility] provider listed and b) the [facility] team listed. Page or secure chat 7A-7P. Log into [email or web address] and use [facility] universal password to access. If you do not  have the password, please contact the hospital operator. Locate the [facility] provider you are looking for under [facility] and page to a number that you can be directly reached. If you still have difficulty reaching the provider, please page the [facility] (Director on Call) for the Hospitalists listed on amion for assistance.

## 2021-09-04 NOTE — Evaluation (Signed)
Physical Therapy Evaluation Patient Details Name: Emily Robertson MRN: [id number] DOB: 04/14/1930 Today's Date: 09/04/2021  History of Present Illness  88 yo female admitted with HTN, acute HF. Hx of A fib with RVR, pelvic fx, OA, RA  Clinical Impression  On eval, pt required Min A for mobility. She walked ~115 feet with a RW. HR 107 bpm, O2 93% on RA. Pt denied dizziness. Discussed d/c plan-pt plans to return home once medically stable. Recommend HHPT f/u and a home health aide, if at all possible. Will plan to follow and progress activity as tolerated.        Recommendations for follow up therapy are one component of a multi-disciplinary discharge planning process, led by the attending physician.  Recommendations may be updated based on patient status, additional functional criteria and insurance authorization.  Follow Up Recommendations Home health PT (may also benefit from a home health aide, if possible)      Assistance Recommended at Discharge Intermittent Supervision/Assistance  Patient can return home with the following  A little help with walking and/or transfers;A little help with bathing/dressing/bathroom;Assistance with cooking/housework;Assist for transportation;Help with stairs or ramp for entrance    Equipment Recommendations None recommended by PT  Recommendations for Other Services  OT consult    Functional Status Assessment Patient has had a recent decline in their functional status and demonstrates the ability to make significant improvements in function in a reasonable and predictable amount of time.     Precautions / Restrictions Precautions Precautions: Fall Precaution Comments: monitor HR and O2 Restrictions Weight Bearing Restrictions: No      Mobility  Bed Mobility Overal bed mobility: Needs Assistance Bed Mobility: Supine to Sit     Supine to sit: Supervision, HOB elevated     General bed mobility comments: Supv for safety. Increased time.     Transfers Overall transfer level: Needs assistance Equipment used: Rolling walker (2 wheels) Transfers: Sit to/from Stand Sit to Stand: Min assist           General transfer comment: Assist to rise, steady, control descent. Cues for safety, hand placement.    Ambulation/Gait Ambulation/Gait assistance: Min guard Gait Distance (Feet): 115 Feet Assistive device: Rolling walker (2 wheels) Gait Pattern/deviations: Step-through pattern, Decreased stride length       General Gait Details: Pt began with step to pattern then progressed to step thru pattern as distance increased. Intermittent unsteadiness. HR 107 bpm, O2 93% on RA.  Stairs            Wheelchair Mobility    Modified Rankin (Stroke Patients Only)       Balance Overall balance assessment: Needs assistance, History of Falls         Standing balance support: Bilateral upper extremity supported, During functional activity, Reliant on assistive device for balance Standing balance-Leahy Scale: Fair                               Pertinent Vitals/Pain Pain Assessment Pain Assessment: No/denies pain    Home Living Family/patient expects to be discharged to:: Private residence Living Arrangements: Alone Available Help at Discharge: Family;Available PRN/intermittently Type of Home: House Home Access: Stairs to enter Entrance Stairs-Rails: Right;Left Entrance Stairs-Number of Steps: 5   Home Layout: Two level;Able to live on main level with bedroom/bathroom Home Equipment: Hand held shower head;Shower seat;Rolling Walker (2 wheels);Grab bars - tub/shower      Prior Function Prior Level of  Function : Independent/Modified Independent             Mobility Comments: uses cane/walker outside PRN ADLs Comments: independent, doesn't use the shower chair currently; still drives     Hand Dominance        Extremity/Trunk Assessment   Upper Extremity Assessment Upper Extremity  Assessment: Defer to OT evaluation    Lower Extremity Assessment Lower Extremity Assessment: Generalized weakness    Cervical / Trunk Assessment Cervical / Trunk Assessment: Kyphotic  Communication   Communication: No difficulties  Cognition Arousal/Alertness: Awake/alert Behavior During Therapy: WFL for tasks assessed/performed Overall Cognitive Status: Within Functional Limits for tasks assessed                                          General Comments      Exercises     Assessment/Plan    PT Assessment Patient needs continued PT services  PT Problem List Decreased strength;Decreased balance;Decreased activity tolerance;Decreased mobility;Decreased knowledge of use of DME       PT Treatment Interventions DME instruction;Gait training;Functional mobility training;Therapeutic activities;Balance training;Therapeutic exercise;Patient/family education    PT Goals (Current goals can be found in the Care Plan section)  Acute Rehab PT Goals Patient Stated Goal: home soon PT Goal Formulation: With patient Time For Goal Achievement: 09/18/21 Potential to Achieve Goals: Good    Frequency Min 3X/week     Co-evaluation               AM-PAC PT "6 Clicks" Mobility  Outcome Measure Help needed turning from your back to your side while in a flat bed without using bedrails?: None Help needed moving from lying on your back to sitting on the side of a flat bed without using bedrails?: None Help needed moving to and from a bed to a chair (including a wheelchair)?: A Little Help needed standing up from a chair using your arms (e.g., wheelchair or bedside chair)?: A Little Help needed to walk in hospital room?: A Little Help needed climbing 3-5 steps with a railing? : A Little 6 Click Score: 20    End of Session Equipment Utilized During Treatment: Gait belt Activity Tolerance: Patient tolerated treatment well Patient left: in chair;with call bell/phone  within reach;with chair alarm set   PT Visit Diagnosis: Muscle weakness (generalized) (M62.81);Unsteadiness on feet (R26.81)    Time: 0034-9179 PT Time Calculation (min) (ACUTE ONLY): 16 min   Charges:   PT Evaluation $PT Eval Moderate Complexity: 1 Mod            Faye Ramsay, PT Acute Rehabilitation  Office: [phone] Pager: [phone]

## 2021-09-04 NOTE — Progress Notes (Signed)
Progress Note  Patient Name: Emily Robertson Date of Encounter: 09/04/2021  [facility] Cardiologist: Kirk Ruths, MD   Subjective   Pt continues to feel better after lasix but has not ambulated or exerted at all - not able to evaluate exertional dyspnea. Remains on O2. Son at bedside. Limited echo completed, not yet read.  Inpatient Medications    Scheduled Meds:  apixaban  2.5 mg Oral BID   diltiazem  180 mg Oral Daily   famotidine  20 mg Oral BID   furosemide  20 mg Intravenous Daily   latanoprost  1 drop Left Eye QHS   leflunomide  10 mg Oral q morning   loratadine  10 mg Oral Daily   methimazole  10 mg Oral BID   metoprolol succinate  100 mg Oral q morning   montelukast  10 mg Oral QPM   multivitamin with minerals  1 tablet Oral q morning   omega-3 acid ethyl esters  1 g Oral Daily   predniSONE  2.5 mg Oral Q breakfast   timolol  1 drop Both Eyes BID   Continuous Infusions:  PRN Meds: acetaminophen **OR** acetaminophen, ondansetron **OR** ondansetron (ZOFRAN) IV   Vital Signs    Vitals:   09/04/21 0500 09/04/21 0523 09/04/21 0600 09/04/21 0700  BP:  114/82    Pulse:  (!) 107    Resp:  18 (!) 22 18  Temp:  98.2 F (36.8 C)    TempSrc:  Oral    SpO2:  99%    Weight: 48.1 kg     Height:        Intake/Output Summary (Last 24 hours) at 09/04/2021 1108 Last data filed at 09/04/2021 0506 Gross per 24 hour  Intake 30 ml  Output 1900 ml  Net -1870 ml      09/04/2021    5:00 AM 09/03/2021    9:05 PM 09/03/2021    9:08 AM  Last 3 Weights  Weight (lbs) 106 lb 0.7 oz 106 lb 0.7 oz 105 lb  Weight (kg) 48.1 kg 48.1 kg 47.628 kg      Telemetry    Afib with rates in the 100s, PVCs - Personally Reviewed  ECG    No new tracings - Personally Reviewed  Physical Exam   GEN: No acute distress.   Neck: minimal JVD Cardiac: iRRR, + murmur Respiratory: respirations unlabored, improved at bases GI: Soft, nontender, non-distended  MS: No edema; No  deformity. Neuro:  Nonfocal  Psych: Normal affect   Labs    High Sensitivity Troponin:   Recent Labs  Lab 09/03/21 1100 09/03/21 2111  TROPONINIHS 24* 20*     Chemistry Recent Labs  Lab 09/03/21 1100 09/03/21 2111 09/04/21 0018 09/04/21 0430  NA 127* 131* 131* 135  K 4.6 4.0 4.2 3.6  CL 95* 92* 95* 96*  CO2 21* $Remov'28 23 28  'ZFRGRg$ GLUCOSE 213* 169* 119* 105*  BUN $Re'21 19 17 17  'aGz$ CREATININE 0.65 0.93 0.76 0.87  CALCIUM 8.9 9.1 8.6* 8.9  PROT 6.2*  --   --   --   ALBUMIN 3.4* 3.9 3.2* 3.1*  AST 55*  --   --   --   ALT 159*  --   --   --   ALKPHOS 456*  --   --   --   BILITOT 1.5* 1.2  --   --   GFRNONAA >60 59* >60 >60  ANIONGAP $RemoveB'11 11 13 11    'fikqQmhZ$ Lipids No  results for input(s): "CHOL", "TRIG", "HDL", "LABVLDL", "LDLCALC", "CHOLHDL" in the last 168 hours.  Hematology Recent Labs  Lab 09/03/21 1100 09/04/21 0018  WBC 12.5* 11.9*  RBC 4.10 4.01  HGB 12.1 12.0  HCT 36.2 36.2  MCV 88.3 90.3  MCH 29.5 29.9  MCHC 33.4 33.1  RDW 15.5 15.9*  PLT 266 236   Thyroid No results for input(s): "TSH", "FREET4" in the last 168 hours.  BNP Recent Labs  Lab 09/03/21 0935  BNP 1,182.0*    DDimer No results for input(s): "DDIMER" in the last 168 hours.   Radiology    US Abdomen Limited RUQ (LIVER/GB)  Result Date: 09/03/2021 CLINICAL DATA:  Elevated liver function studies EXAM: ULTRASOUND ABDOMEN LIMITED RIGHT UPPER QUADRANT COMPARISON:  None Available. FINDINGS: Gallbladder: No gallstones or wall thickening visualized. No sonographic Murphy sign noted by sonographer. Common bile duct: Diameter: 3 mm, normal Liver: No focal lesion identified. Within normal limits in parenchymal echogenicity. Portal vein is patent on color Doppler imaging with normal direction of blood flow towards the liver. Other: A small right pleural effusion is identified. IMPRESSION: 1. No evidence of cholelithiasis or acute cholecystitis. 2. Right pleural effusion. Electronically Signed   By: Lucienne Capers M.D.    On: 09/03/2021 17:49   CT Chest Wo Contrast  Result Date: 09/03/2021 CLINICAL DATA:  Weakness, fatigue EXAM: CT CHEST WITHOUT CONTRAST TECHNIQUE: Multidetector CT imaging of the chest was performed following the standard protocol without IV contrast. RADIATION DOSE REDUCTION: This exam was performed according to the departmental dose-optimization program which includes automated exposure control, adjustment of the mA and/or kV according to patient size and/or use of iterative reconstruction technique. COMPARISON:  CT 01/12/2021 FINDINGS: Cardiovascular: Heart size is mildly enlarged. No pericardial effusion. Thoracic aorta is nonaneurysmal. Atherosclerotic calcifications of the aorta and coronary arteries. Mediastinum/Nodes: No axillary lymphadenopathy. Redemonstration mildly enlarged mediastinal lymph nodes including 10 mm right paratracheal node (series 2, image 52) and 15 mm precarinal node (series 2, image 61). Evaluation of the hilar structures is limited in the absence of intravenous contrast. Within this limitation, no obvious hilar adenopathy or mass is identified. Thyroid gland, trachea, and esophagus demonstrate no significant findings. Lungs/Pleura: Small-moderate-sized layering bilateral pleural effusions with associated compressive atelectasis. Extensive biapical pleuroparenchymal scarring. Ground-glass nodular density at the right lung apex measures approximately 2.1 x 1.8 cm (series 7, image 32), previously approximately 1.4 x 1.0 cm. Upper Abdomen: No acute abnormality. Musculoskeletal: Multilevel spondylosis of the thoracic spine and imaged lower cervical spine. Bones appear demineralized. No acute bony findings. No chest wall abnormality. IMPRESSION: 1. Small-moderate-sized layering bilateral pleural effusions with associated compressive atelectasis. 2. Ground-glass nodular density at the right lung apex measuring approximately 2.1 x 1.8 cm, previously measured 1.4 x 1.0 cm. Adenocarcinoma  cannot be excluded. PET-CT and/or tissue sampling should be considered with patient's age and functional status taken into consideration. 3. Redemonstration of mildly enlarged mediastinal lymph nodes, which may be reactive. 4. Aortic and coronary artery atherosclerosis. Aortic Atherosclerosis (ICD10-I70.0). Electronically Signed   By: Davina Poke D.O.   On: 09/03/2021 13:15   DG Chest Port 1 View  Result Date: 09/03/2021 CLINICAL DATA:  Shortness of breath. EXAM: PORTABLE CHEST 1 VIEW COMPARISON:  December 19, 2020 FINDINGS: Chronic nodular opacities at the right lung apex, not well assessed on this study. Bibasilar opacities. No visible pleural effusions or pneumothorax. Cardiomediastinal silhouette is unchanged. Polyarticular degenerative change in osteopenia. IMPRESSION: 1. Bibasilar opacities, which could represent atelectasis and/or pneumonia. 2.  Chronic nodular opacities at the right lung apex, not well assessed on this study. Please see prior 01/12/2021 CT chest for follow-up recommendations. Electronically Signed   By: Margaretha Sheffield M.D.   On: 09/03/2021 09:44    Cardiac Studies   Echo limited - pending read  Echo 07/2021:  1. Left ventricular ejection fraction, by estimation, is 60 to 65%. The  left ventricle has normal function. The left ventricle has no regional  wall motion abnormalities. Left ventricular diastolic function could not  be evaluated.   2. Right ventricular systolic function is normal. The right ventricular  size is normal. There is mildly elevated pulmonary artery systolic  pressure.   3. Left atrial size was severely dilated.   4. Right atrial size was moderately dilated.   5. The mitral valve is normal in structure. Moderate to severe mitral  valve regurgitation. No evidence of mitral stenosis.   6. Tricuspid valve regurgitation is mild to moderate.   7. The aortic valve is tricuspid. Aortic valve regurgitation is mild. No  aortic stenosis is present.   8.  The inferior vena cava is normal in size with greater than 50%  respiratory variability, suggesting right atrial pressure of 3 mmHg.   Patient Profile     88 y.o. female with a hx of recently diagnosed atrial fibrillation, hypertension, hyperlipidemia, rheumatoid arthritis, and glaucoma who was seen for the evaluation of CHF and atrial fibrillation.  Assessment & Plan    Dyspnea, orthopnea Acute diastolic heart failure Moderate to severe MR - BNP elevated to 1182 - suspect a degree of diastolic dysfunction, limited echo pending to recheck LVEF and MR - continues to feel well after diuresis - 20 mg IV lasix dialy x 2 doses with nearly 2 L urine output charted last night, likely incomplete I&Os from ER - weight is actually stable - remains on supplemental O2 - if EF significantly reduced, may need to transition off of cardizem - low albumin complicating volume status: albumin 3.1 - need to mobilize as tolerated to evaluate exertional symptoms, PT consulted - clinically not extremely volume up - pending echo results, if she remains hypoxic, may need RHC - question candidacy for mitraclip   Atrial fibrillation with RVR Continue cardizem 180 mg  Toprol increased to 150 mg daily As above, if EF significantly reduced, may need to transition off of cardizem AAT limited given CAD on chest CT and CHF, would avoid amiodarone given uncontrolled thyroid disease - will try to avoid DCCV until thyroid is better controlled, but picture is complicated given MR   Hyperthyroidism Confirmed during her admission 07/2021 Started on methimazole  Consider rechecking labs   Aortic and coronary artery atherosclerosis  - seen on CT chest - no prior evaluation - no chest pain     Elevated LFTs AST and ALT mildly elevated Alk phos 456 - question if related to CHF, limited echo as above     Leukocytosis WBC 12.5 UA unremarkable Defer to primary      For questions or updates, please contact  [facility] Please consult [email or web address] for contact info under        Signed, Ledora Bottcher, PA  09/04/2021, 11:08 AM

## 2021-09-04 NOTE — Progress Notes (Signed)
Pt is scheduled for TEE 09/05/21 at 10:30 am with Dr. Royann Shivers to image mitral valve. NPO at MN please.   Consent was obtained with Dr. Rusti Arizmendi Salvia.  Marcelino Duster, PA-C 09/04/2021, 2:38 PM [phone] [facility] [address] [place], [place] [address]

## 2021-09-04 NOTE — Evaluation (Signed)
Clinical/Bedside Swallow Evaluation Patient Details  Name: Emily Robertson MRN: [id number] Date of Birth: September 07, 1930  Today's Date: 09/04/2021 Time: SLP Start Time (ACUTE ONLY): 1640 SLP Stop Time (ACUTE ONLY): 1655 SLP Time Calculation (min) (ACUTE ONLY): 15 min  Past Medical History:  Past Medical History:  Diagnosis Date   Arthritis    Glaucoma of both eyes    Hyperlipidemia    Hypertension    Pelvic prolapse    anterior floor   Urgency of urination    Urinary hesitancy    Urticaria    Past Surgical History:  Past Surgical History:  Procedure Laterality Date   PUBOVAGINAL SLING N/A 08/31/2012   Procedure: BOSTON SCIENTIFIC UPHOLD LIGHT SACROSPINUS REPAIR;  Surgeon: Kathi Ludwig, MD;  Location: [facility] [place];  Service: Urology;  Laterality: N/A;   RETINAL DETACHMENT SURGERY Right    HPI:  Patient is an 88 y.o. female with PMH: acute HF, h/o afib with RVR, pelvic fracture, OA, RA. She was admitted on 09/03/21 with HTN. She c/o some dysphagia symptoms to RD which prompted order for SLP swallow eval.    Assessment / Plan / Recommendation  Clinical Impression  Patient is presenting with clinical s/s of dysphagia as per this bedside/clinical swallow evaluation. She reported feeling of not being able to swallow solid foods and that the bolus stays in her mouth. She denies any coughing, throat clearing or globus sensation in throat. She does endorse dry mouth and throat. She reported that she was losing some weight prior to recent pelvic fracture and subsequent surgery and has lost about 10 pounds unintentionally. She also endorses some decreased taste sensation. SLP observed patient with straw sips of thin liquids (water) and although her swallow initiation appeared timely, she did exhibit a mildly delayed cough after first couple sips. She fully recovered and did not exhibit this coughing on subsequent sips. SLP educated patient and recomended she focus on foods that  have moisture and reduce intake of foods that are dry, tough, hard to chew, etc. SLP will plan to f/u next date schedule permitting, to ensure she is tolerating PO's. SLP Visit Diagnosis: Dysphagia, unspecified (R13.10)    Aspiration Risk  Mild aspiration risk;No limitations    Diet Recommendation Regular;Thin liquid;Dysphagia 3 (Mech soft)   Liquid Administration via: Cup;Straw Medication Administration: Whole meds with liquid Supervision: Patient able to self feed Compensations: Slow rate;Small sips/bites Postural Changes: Seated upright at 90 degrees    Other  Recommendations Oral Care Recommendations: Oral care BID    Recommendations for follow up therapy are one component of a multi-disciplinary discharge planning process, led by the attending physician.  Recommendations may be updated based on patient status, additional functional criteria and insurance authorization.  Follow up Recommendations No SLP follow up      Assistance Recommended at Discharge None  Functional Status Assessment Patient has had a recent decline in their functional status and demonstrates the ability to make significant improvements in function in a reasonable and predictable amount of time.  Frequency and Duration min 1 x/week  1 week       Prognosis Prognosis for Safe Diet Advancement: Good      Swallow Study   General Date of Onset: 09/04/21 HPI: Patient is an 88 y.o. female with PMH: acute HF, h/o afib with RVR, pelvic fracture, OA, RA. She was admitted on 09/03/21 with HTN. She c/o some dysphagia symptoms to RD which prompted order for SLP swallow eval. Type of Study: Bedside  Swallow Evaluation Previous Swallow Assessment: none found Diet Prior to this Study: Regular;Thin liquids Temperature Spikes Noted: No Respiratory Status: Room air History of Recent Intubation: No Behavior/Cognition: Alert;Cooperative;Pleasant mood Oral Cavity Assessment: Within Functional Limits Oral Care Completed by  SLP: No Oral Cavity - Dentition: Adequate natural dentition Vision: Functional for self-feeding Self-Feeding Abilities: Able to feed self Patient Positioning: Upright in chair Baseline Vocal Quality: Normal Volitional Cough: Strong Volitional Swallow: Able to elicit    Oral/Motor/Sensory Function Overall Oral Motor/Sensory Function: Within functional limits   Ice Chips     Thin Liquid Thin Liquid: Impaired Presentation: Straw Pharyngeal  Phase Impairments: Cough - Delayed    Nectar Thick     Honey Thick     Puree Puree: Not tested   Solid     Solid: Not tested     Angela Nevin, MA, CCC-SLP Speech Therapy

## 2021-09-04 NOTE — Consult Note (Signed)
NAME:  Emily Robertson, MRN:  [id number], DOB:  1930/10/02, LOS: 1 ADMISSION DATE:  09/03/2021, CONSULTATION DATE:  7/25 REFERRING MD:  Dr. Jacqulyn Bath, CHIEF COMPLAINT:  Pulmonary nodule.    History of Present Illness:  88 year old female with PMH as below, which is significant for atrial fib, HTN, and HLD. Recent course significant for fall in June resulting in a pelvic fracture. Was found to be in AF at that time. She was started on Eliquis and diltiazem. Started on tx for hyperthyroid at that time as well. She presented to [facility] ED 7/24 with complaints of shortness of breath. Found to again to be in AF with RVR. CT chest was done and demonstrated bilateral pleural effusions as well as R apex  CT in December of last year, 2022 now larger (2.1 x 1.8 cm, previously measured 1.4 x 1.0 cm.). She was admitted to the hospitalist service and cardiology was consulted for management of CHF/AF. PCCM consulted for pulmonary nodule. She is having TEE  09/05/21 for Mitral valve.  She is a non-smoker.  She lives alone and drives in town.  Son lives in [place] and daughter lives in [place] and gives her good support.    has a past medical history of Arthritis, Glaucoma of both eyes, Hyperlipidemia, Hypertension, Pelvic prolapse, Urgency of urination, Urinary hesitancy, and Urticaria.   reports that she has never smoked. She has never used smokeless tobacco.  Past Surgical History:  Procedure Laterality Date   PUBOVAGINAL SLING N/A 08/31/2012   Procedure: BOSTON SCIENTIFIC UPHOLD LIGHT SACROSPINUS REPAIR;  Surgeon: Kathi Ludwig, MD;  Location: [facility];  Service: Urology;  Laterality: N/A;   RETINAL DETACHMENT SURGERY Right     Allergies  Allergen Reactions   Cozaar [Losartan] Swelling    Tongue swelling   Erythromycin Itching   Azopt [Brinzolamide] Rash    Rash on eyelid    Immunization History  Administered Date(s) Administered   PFIZER(Purple Top)SARS-COV-2  Vaccination 04/15/2019, 05/12/2019, 11/27/2019    Family History  Problem Relation Age of Onset   Hypertension Mother    Thyroid disease Neg Hx    Diabetes Neg Hx      Current Facility-Administered Medications:    [START ON 09/05/2021] 0.9 %  sodium chloride infusion, , Intravenous, Continuous, Duke, Roe Rutherford, PA   acetaminophen (TYLENOL) tablet 650 mg, 650 mg, Oral, Q6H PRN **OR** acetaminophen (TYLENOL) suppository 650 mg, 650 mg, Rectal, Q6H PRN, Ronaldo Miyamoto, Tyrone A, DO   apixaban (ELIQUIS) tablet 2.5 mg, 2.5 mg, Oral, BID, Kyle, Tyrone A, DO, 2.5 mg at 09/04/21 1013   diltiazem (CARDIZEM CD) 24 hr capsule 180 mg, 180 mg, Oral, Daily, Kyle, Tyrone A, DO, 180 mg at 09/04/21 1013   famotidine (PEPCID) tablet 20 mg, 20 mg, Oral, BID, Kyle, Tyrone A, DO, 20 mg at 09/04/21 1014   feeding supplement (ENSURE ENLIVE / ENSURE PLUS) liquid 237 mL, 237 mL, Oral, TID BM, Pahwani, Ravi, MD, 237 mL at 09/04/21 1503   furosemide (LASIX) injection 20 mg, 20 mg, Intravenous, Daily, Kyle, Tyrone A, DO, 20 mg at 09/04/21 1013   latanoprost (XALATAN) 0.005 % ophthalmic solution 1 drop, 1 drop, Left Eye, QHS, Kyle, Tyrone A, DO, 1 drop at 09/04/21 0001   leflunomide (ARAVA) tablet 10 mg, 10 mg, Oral, q morning, Kyle, Tyrone A, DO, 10 mg at 09/04/21 1014   loratadine (CLARITIN) tablet 10 mg, 10 mg, Oral, Daily, Kyle, Tyrone A, DO, 10 mg at 09/04/21 1014  methimazole (TAPAZOLE) tablet 10 mg, 10 mg, Oral, BID, Kyle, Tyrone A, DO, 10 mg at 09/04/21 1014   [START ON 09/05/2021] metoprolol succinate (TOPROL-XL) 24 hr tablet 150 mg, 150 mg, Oral, q morning, Duke, Roe Rutherford, PA   montelukast (SINGULAIR) tablet 10 mg, 10 mg, Oral, QPM, Kyle, Tyrone A, DO, 10 mg at 09/04/21 1734   multivitamin with minerals tablet 1 tablet, 1 tablet, Oral, q morning, Kyle, Tyrone A, DO, 1 tablet at 09/04/21 1014   omega-3 acid ethyl esters (LOVAZA) capsule 1 g, 1 g, Oral, Daily, Kyle, Tyrone A, DO, 1 g at 09/04/21 1013    ondansetron (ZOFRAN) tablet 4 mg, 4 mg, Oral, Q6H PRN **OR** ondansetron (ZOFRAN) injection 4 mg, 4 mg, Intravenous, Q6H PRN, Ronaldo Miyamoto, Tyrone A, DO   predniSONE (DELTASONE) tablet 2.5 mg, 2.5 mg, Oral, Q breakfast, Kyle, Tyrone A, DO, 2.5 mg at 09/04/21 1014   timolol (TIMOPTIC) 0.5 % ophthalmic solution 1 drop, 1 drop, Both Eyes, BID, Kyle, Tyrone A, DO, 1 drop at 09/04/21 1015    Significant Hospital Events: Including procedures, antibiotic start and stop dates in addition to other pertinent events     Interim History / Subjective:   09/04/2021: Seen in bed 1418 at [facility] long.  She looks well and is eating pizza  Objective   Blood pressure 115/70, pulse 94, temperature 97.6 F (36.4 C), temperature source Oral, resp. rate 20, height  (1.626 m), weight 48.1 kg, SpO2 96 %.        Intake/Output Summary (Last 24 hours) at 09/04/2021 1320 Last data filed at 09/04/2021 1000 Gross per 24 hour  Intake 330 ml  Output 1900 ml  Net -1570 ml   Filed Weights   09/03/21 0908 09/03/21 2105 09/04/21 0500  Weight: 47.6 kg 48.1 kg 48.1 kg    Examination: General: Frail elderly female in NAD HENT: [place]/AT, PERRL, no JVD (seated in bedside chair) Lungs: Clear bilateral breath sounds. Diminished in posterior bases.  Cardiovascular: IRIR, Murmur  Abdomen: Soft, NT, ND Extremities: No acute deformity. No significant edema.  Neuro: Alert, oriented, non-focal.   Resolved Hospital Problem list     Assessment & Plan:  Non smokler Pulmonary nodule right upper lobe: - ggo Was seen on prior CT in December of last year, 2022 now larger (2.1 x 1.8 cm, previously measured 1.4 x 1.0 cm.).  Possible reactive mediastinal/hilar lymphadenopathy  -Moderate-high probability for slow-growing bronchoalveolar early stage lung cancer.  -Typically patients with this issues succumb to other issues as opposed to lung cancer itself  Plan -  -Currently this done on an urgent/emergent inpatient issue and can be  handled on an outpatient basis -We will get outpatient PET scan  -Discussed [facility] recent study which is a nasal swab to determine pretest probability of lung nodule for cancer -she is interested if she prescreen passes -have sent message to the [employer] research team -We will need follow-up with Dr. Levy Pupa or Dr. Audie Box in the clinic   Bilateral pleural effusion: likely secondary to acute CHF and mitral valve disease - Plan  - This will be the primary issue being managed by hospitalist and cardiologist.  Best Practice (right click and "Reselect all SmartList Selections" daily)    Per trioad     SIGNATURE    Dr. Kalman Shan, M.D., F.C.C.P,  Pulmonary and Critical Care Medicine Staff Physician, [facility] Center Director - Interstitial Lung Disease  Program  Medical Director - [facility] Long ICU Pulmonary Fibrosis  [facility] [place], [place], [address]  NPI Number:  NPI #[id number] DEA Number: [id number]  Pager: [phone], If no answer  -> Check AMION or Try [phone] Telephone (clinical office): [phone] Telephone (research): [phone]  6:42 PM 09/04/2021   LABS    PULMONARY No results for input(s): "PHART", "PCO2ART", "PO2ART", "HCO3", "TCO2", "O2SAT" in the last 168 hours.  Invalid input(s): "PCO2", "PO2"  CBC Recent Labs  Lab 09/03/21 1100 09/04/21 0018  HGB 12.1 12.0  HCT 36.2 36.2  WBC 12.5* 11.9*  PLT 266 236    COAGULATION No results for input(s): "INR" in the last 168 hours.  CARDIAC  No results for input(s): "TROPONINI" in the last 168 hours. No results for input(s): "PROBNP" in the last 168 hours.   CHEMISTRY Recent Labs  Lab 09/03/21 1100 09/03/21 2111 09/04/21 0018 09/04/21 0430  NA 127* 131* 131* 135  K 4.6 4.0 4.2 3.6  CL 95* 92* 95* 96*  CO2 21* GLUCOSE 213* 169* 119* 105*  BUN CREATININE 0.65 0.93 0.76 0.87  CALCIUM 8.9  9.1 8.6* 8.9  PHOS  --  3.2 3.3 3.2   Estimated Creatinine Clearance: 33.3 mL/min (by C-G formula based on SCr of 0.87 mg/dL).   LIVER Recent Labs  Lab 09/03/21 1100 09/03/21 2111 09/04/21 0018 09/04/21 0430  AST 55*  --   --   --   ALT 159*  --   --   --   ALKPHOS 456*  --   --   --   BILITOT 1.5* 1.2  --   --   PROT 6.2*  --   --   --   ALBUMIN 3.4* 3.9 3.2* 3.1*     INFECTIOUS Recent Labs  Lab 09/03/21 2111  PROCALCITON 0.10     ENDOCRINE CBG (last 3)  No results for input(s): "GLUCAP" in the last 72 hours.       IMAGING x48h  - image(s) personally visualized  -   highlighted in bold ECHOCARDIOGRAM LIMITED  Result Date: 09/04/2021    ECHOCARDIOGRAM LIMITED REPORT   Patient Name:   RAIN WILHIDE Date of Exam: 09/04/2021 Medical Rec #:  [id number]         Height:       64.0 in Accession #:    [id number]        Weight:       106.0 lb Date of Birth:  1930/09/02         BSA:          1.494 m Patient Age:    89 years          BP:           114/82 mmHg Patient Gender: F                 HR:           84 bpm. Exam Location:  Inpatient Procedure: 3D Echo, Limited Echo, Cardiac Doppler and Color Doppler Indications:    I50.40* Unspecified combined systolic (congestive) and diastolic                 (congestive) heart failure; I34.0 Nonrheumatic mitral (valve)                 insufficiency  History:        Patient has prior history of Echocardiogram examinations, most  recent 07/26/2021. CHF, Abnormal ECG, Mitral Valve Disease,                 Arrythmias:Atrial Fibrillation, Signs/Symptoms:Shortness of                 Breath and Dyspnea; Risk Factors:Dyslipidemia and Hypertension.  Sonographer:    Sheralyn Boatman RDCS Referring Phys: [id number] ANGELA NICOLE DUKE IMPRESSIONS  1. Left ventricular ejection fraction, by estimation, is 50 to 55%. The left ventricle has low normal function. There is moderate concentric left ventricular hypertrophy.  2. Left atrial size was severely  dilated.  3. Right atrial size was severely dilated.  4. Eccentric anteriorly directed jet. Posterior leaflet appears prolapsed. The mitral valve is degenerative. Moderate to severe mitral valve regurgitation.  5. Tricuspid valve regurgitation is mild to moderate.  6. Aortic valve regurgitation is trivial.  7. There is moderately elevated pulmonary artery systolic pressure. Comparison(s): No significant change from prior study. Conclusion(s)/Recommendation(s): Recommend TEE for better assessment of MR etiology and severity. FINDINGS  Left Ventricle: Left ventricular ejection fraction, by estimation, is 50 to 55%. The left ventricle has low normal function. There is moderate concentric left ventricular hypertrophy. Right Ventricle: There is moderately elevated pulmonary artery systolic pressure. The tricuspid regurgitant velocity is 2.78 m/s, and with an assumed right atrial pressure of 15 mmHg, the estimated right ventricular systolic pressure is 45.9 mmHg. Left Atrium: Left atrial size was severely dilated. Right Atrium: Right atrial size was severely dilated. Mitral Valve: Eccentric anteriorly directed jet. Posterior leaflet appears prolapsed. The mitral valve is degenerative in appearance. Moderate to severe mitral valve regurgitation. Tricuspid Valve: Tricuspid valve regurgitation is mild to moderate. Aortic Valve: Aortic valve regurgitation is trivial. LEFT VENTRICLE PLAX 2D LVIDd:         3.70 cm LVIDs:         2.80 cm LV PW:         1.40 cm LV IVS:        1.40 cm                            3D Volume EF:                            3D EF:        52 % LV Volumes (MOD)           LV EDV:       87 ml LV vol d, MOD A2C: 74.6 ml LV ESV:       42 ml LV vol d, MOD A4C: 60.5 ml LV SV:        46 ml LV vol s, MOD A2C: 38.8 ml LV vol s, MOD A4C: 32.1 ml LV SV MOD A2C:     35.8 ml LV SV MOD A4C:     60.5 ml LV SV MOD BP:      31.8 ml IVC IVC diam: 2.00 cm LEFT ATRIUM              Index LA diam:        3.95 cm  2.64 cm/m LA  Vol (A2C):   98.2 ml  65.73 ml/m LA Vol (A4C):   118.0 ml 78.98 ml/m LA Biplane Vol: 110.0 ml 73.63 ml/m   AORTA Ao Root diam: 3.20 cm MR Peak grad:    81.5 mmHg    TRICUSPID VALVE MR Mean grad:  48.0 mmHg    TR Peak grad:   30.9 mmHg MR Vmax:         451.50 cm/s  TR Vmax:        278.00 cm/s MR Vmean:        315.0 cm/s MR PISA:         3.08 cm MR PISA Eff ROA: 26 mm MR PISA Radius:  0.70 cm Carolan Clines Electronically signed by Carolan Clines Signature Date/Time: 09/04/2021/12:20:21 PM    Final    US Abdomen Limited RUQ (LIVER/GB)  Result Date: 09/03/2021 CLINICAL DATA:  Elevated liver function studies EXAM: ULTRASOUND ABDOMEN LIMITED RIGHT UPPER QUADRANT COMPARISON:  None Available. FINDINGS: Gallbladder: No gallstones or wall thickening visualized. No sonographic Murphy sign noted by sonographer. Common bile duct: Diameter: 3 mm, normal Liver: No focal lesion identified. Within normal limits in parenchymal echogenicity. Portal vein is patent on color Doppler imaging with normal direction of blood flow towards the liver. Other: A small right pleural effusion is identified. IMPRESSION: 1. No evidence of cholelithiasis or acute cholecystitis. 2. Right pleural effusion. Electronically Signed   By: Burman Nieves M.D.   On: 09/03/2021 17:49   CT Chest Wo Contrast  Result Date: 09/03/2021 CLINICAL DATA:  Weakness, fatigue EXAM: CT CHEST WITHOUT CONTRAST TECHNIQUE: Multidetector CT imaging of the chest was performed following the standard protocol without IV contrast. RADIATION DOSE REDUCTION: This exam was performed according to the departmental dose-optimization program which includes automated exposure control, adjustment of the mA and/or kV according to patient size and/or use of iterative reconstruction technique. COMPARISON:  CT 01/12/2021 FINDINGS: Cardiovascular: Heart size is mildly enlarged. No pericardial effusion. Thoracic aorta is nonaneurysmal. Atherosclerotic calcifications of the aorta and  coronary arteries. Mediastinum/Nodes: No axillary lymphadenopathy. Redemonstration mildly enlarged mediastinal lymph nodes including 10 mm right paratracheal node (series 2, image 52) and 15 mm precarinal node (series 2, image 61). Evaluation of the hilar structures is limited in the absence of intravenous contrast. Within this limitation, no obvious hilar adenopathy or mass is identified. Thyroid gland, trachea, and esophagus demonstrate no significant findings. Lungs/Pleura: Small-moderate-sized layering bilateral pleural effusions with associated compressive atelectasis. Extensive biapical pleuroparenchymal scarring. Ground-glass nodular density at the right lung apex measures approximately 2.1 x 1.8 cm (series 7, image 32), previously approximately 1.4 x 1.0 cm. Upper Abdomen: No acute abnormality. Musculoskeletal: Multilevel spondylosis of the thoracic spine and imaged lower cervical spine. Bones appear demineralized. No acute bony findings. No chest wall abnormality. IMPRESSION: 1. Small-moderate-sized layering bilateral pleural effusions with associated compressive atelectasis. 2. Ground-glass nodular density at the right lung apex measuring approximately 2.1 x 1.8 cm, previously measured 1.4 x 1.0 cm. Adenocarcinoma cannot be excluded. PET-CT and/or tissue sampling should be considered with patient's age and functional status taken into consideration. 3. Redemonstration of mildly enlarged mediastinal lymph nodes, which may be reactive. 4. Aortic and coronary artery atherosclerosis. Aortic Atherosclerosis (ICD10-I70.0). Electronically Signed   By: Duanne Guess D.O.   On: 09/03/2021 13:15   DG Chest Port 1 View  Result Date: 09/03/2021 CLINICAL DATA:  Shortness of breath. EXAM: PORTABLE CHEST 1 VIEW COMPARISON:  December 19, 2020 FINDINGS: Chronic nodular opacities at the right lung apex, not well assessed on this study. Bibasilar opacities. No visible pleural effusions or pneumothorax.  Cardiomediastinal silhouette is unchanged. Polyarticular degenerative change in osteopenia. IMPRESSION: 1. Bibasilar opacities, which could represent atelectasis and/or pneumonia. 2. Chronic nodular opacities at the right lung apex, not well assessed on this study. Please see  prior 01/12/2021 CT chest for follow-up recommendations. Electronically Signed   By: Feliberto Harts M.D.   On: 09/03/2021 09:44

## 2021-09-04 NOTE — Progress Notes (Signed)
Initial Nutrition Assessment  DOCUMENTATION CODES:   Severe malnutrition in context of chronic illness, Underweight  INTERVENTION:  - will order Ensure Plus High Protein TID, each supplement provides 350 kcal and 20 grams of protein.  - discussed SLP consult with Attending.  - will liberalize diet from Heart Healthy to 2 gram Na.  - will add High Calorie, High Protein handout to AVS.  - provide Ensure coupons at the time of d/c.   NUTRITION DIAGNOSIS:   Severe Malnutrition related to chronic illness as evidenced by severe fat depletion, severe muscle depletion.  GOAL:   Patient will meet greater than or equal to 90% of their needs  MONITOR:   PO intake, Supplement acceptance, Labs, Weight trends  REASON FOR ASSESSMENT:   Malnutrition Screening Tool  ASSESSMENT:   88 y.o. female with medical history of afib, HLD, glaucoma, OA, RA, and hyperthyroidism. She presented to the ED with dyspnea, dry cough, and nausea x2 days. she also reported decreased appetite and intakes. She was admitted due to hypertension.  Patient sitting up in bed with lunch tray on bedside table. Flow sheet documentation indicates she ate 75% of breakfast this AM and lunch tray with 75% completion.  Patient shares that she has had decreased appetite and 10 lb weight loss in the past several years. She is interested in gaining weight.   She lives alone and had been doing her own grocery shopping until a fall in December. Her son and granddaughter now do the grocery shopping for her. She has considered receiving Mom's Meals and shares that she was informed that she qualifies. She recently bought Boost for use at home and asks about protein supplement options available at [employer].   She shares that she has difficulty swallowing and feels that things get stuck. She is unsure of how to best manage this. She is receptive to SLP assistance in this matter.  Since fall in December, she uses a walker to aid in  ambulation.   Weight today is 106 lb and PTA the only recent weight was 130 lb on 07/26/21. This would indicate 24 lb weight loss (18.5% body weight) in the past 1 month; question accuracy of this.    Labs reviewed; Cl: 96 mmol/l. Medications reviewed; 20 mg pepcid BID, 20 mg IV lasix/day, 1 tablet multivitamin with minerals/day, 1 g lovaza, 2.5 mg deltasone/day.    NUTRITION - FOCUSED PHYSICAL EXAM:  Flowsheet Row Most Recent Value  Orbital Region Moderate depletion  Upper Arm Region Severe depletion  Thoracic and Lumbar Region Severe depletion  Buccal Region Moderate depletion  Temple Region Moderate depletion  Clavicle Bone Region Severe depletion  Clavicle and Acromion Bone Region Severe depletion  Scapular Bone Region Severe depletion  Dorsal Hand Moderate depletion  Patellar Region Severe depletion  Anterior Thigh Region Severe depletion  Posterior Calf Region Severe depletion  Edema (RD Assessment) None  Hair Reviewed  Eyes Reviewed  Mouth Reviewed  Skin Reviewed  Nails Reviewed       Diet Order:   Diet Order             Diet Heart Room service appropriate? Yes; Fluid consistency: Thin  Diet effective now                   EDUCATION NEEDS:   Education needs have been addressed  Skin:  Skin Assessment: Reviewed RN Assessment  Last BM:  7/24 (type 4 x1, small amount)  Height:   Ht Readings from Last  1 Encounters:  09/03/21 5\' 4"  (1.626 m)    Weight:   Wt Readings from Last 1 Encounters:  09/04/21 48.1 kg    BMI:  Body mass index is 18.2 kg/m.  Estimated Nutritional Needs:  Kcal:  1500-1800 kcal Protein:  75-85 grams Fluid:  >/= 1.5 L/day     09/06/21, MS, RD, LDN, CNSC Registered Dietitian II Inpatient Clinical Nutrition RD pager # and on-call/weekend pager # available in [facility]

## 2021-09-05 ENCOUNTER — Encounter (HOSPITAL_COMMUNITY)
Admission: EM | Disposition: A | Discharge status: Home Health | Payer: Self-pay | Source: Home / Self Care | Attending: Internal Medicine

## 2021-09-05 ENCOUNTER — Encounter (HOSPITAL_COMMUNITY): Payer: Self-pay | Admitting: Internal Medicine

## 2021-09-05 ENCOUNTER — Inpatient Hospital Stay (HOSPITAL_COMMUNITY): Payer: Medicare PPO

## 2021-09-05 ENCOUNTER — Inpatient Hospital Stay (HOSPITAL_COMMUNITY): Payer: Medicare PPO | Admitting: Anesthesiology

## 2021-09-05 ENCOUNTER — Telehealth: Payer: Self-pay | Admitting: Internal Medicine

## 2021-09-05 DIAGNOSIS — I5033 Acute on chronic diastolic (congestive) heart failure: Secondary | ICD-10-CM | POA: Diagnosis not present

## 2021-09-05 DIAGNOSIS — E43 Unspecified severe protein-calorie malnutrition: Secondary | ICD-10-CM | POA: Insufficient documentation

## 2021-09-05 DIAGNOSIS — I110 Hypertensive heart disease with heart failure: Secondary | ICD-10-CM

## 2021-09-05 DIAGNOSIS — I083 Combined rheumatic disorders of mitral, aortic and tricuspid valves: Secondary | ICD-10-CM | POA: Diagnosis not present

## 2021-09-05 DIAGNOSIS — I340 Nonrheumatic mitral (valve) insufficiency: Secondary | ICD-10-CM

## 2021-09-05 DIAGNOSIS — I4891 Unspecified atrial fibrillation: Secondary | ICD-10-CM | POA: Diagnosis not present

## 2021-09-05 DIAGNOSIS — I10 Essential (primary) hypertension: Secondary | ICD-10-CM | POA: Diagnosis not present

## 2021-09-05 DIAGNOSIS — I509 Heart failure, unspecified: Secondary | ICD-10-CM | POA: Diagnosis not present

## 2021-09-05 DIAGNOSIS — E0590 Thyrotoxicosis, unspecified without thyrotoxic crisis or storm: Secondary | ICD-10-CM

## 2021-09-05 HISTORY — PX: TEE WITHOUT CARDIOVERSION: SHX5443

## 2021-09-05 LAB — T4, FREE: Free T4: 1.49 ng/dL — ABNORMAL HIGH (ref 0.61–1.12)

## 2021-09-05 LAB — BASIC METABOLIC PANEL
Anion gap: 8 (ref 5–15)
BUN: 23 mg/dL (ref 8–23)
CO2: 30 mmol/L (ref 22–32)
Calcium: 9.1 mg/dL (ref 8.9–10.3)
Chloride: 95 mmol/L — ABNORMAL LOW (ref 98–111)
Creatinine, Ser: 0.86 mg/dL (ref 0.44–1.00)
GFR, Estimated: >60 mL/min (ref >60)
Glucose, Bld: 102 mg/dL — ABNORMAL HIGH (ref 70–99)
Potassium: 3.6 mmol/L (ref 3.5–5.1)
Sodium: 133 mmol/L — ABNORMAL LOW (ref 135–145)

## 2021-09-05 LAB — TSH: TSH: 1.062 u[IU]/mL (ref 0.350–4.500)

## 2021-09-05 LAB — ECHO TEE
MV M vel: 4.42 m/s
MV Peak grad: 78.1 mmHg
Radius: 0.5 cm

## 2021-09-05 SURGERY — ECHOCARDIOGRAM, TRANSESOPHAGEAL
Anesthesia: Monitor Anesthesia Care

## 2021-09-05 MED ORDER — PHENYLEPHRINE 80 MCG/ML (10ML) SYRINGE FOR IV PUSH (FOR BLOOD PRESSURE SUPPORT)
PREFILLED_SYRINGE | INTRAVENOUS | Status: DC | PRN
  Administered 2021-09-05 (×3): 80 ug via INTRAVENOUS

## 2021-09-05 MED ORDER — PROPOFOL 10 MG/ML IV BOLUS
INTRAVENOUS | Status: DC | PRN
  Administered 2021-09-05 (×2): 10 mg via INTRAVENOUS
  Administered 2021-09-05: 20 mg via INTRAVENOUS

## 2021-09-05 MED ORDER — PROPOFOL 500 MG/50ML IV EMUL
INTRAVENOUS | Status: DC | PRN
  Administered 2021-09-05: 150 ug/kg/min via INTRAVENOUS

## 2021-09-05 MED ORDER — FUROSEMIDE 10 MG/ML IJ SOLN
40.0000 mg | Freq: Every day | INTRAMUSCULAR | Status: DC
  Administered 2021-09-05 – 2021-09-06 (×2): 40 mg via INTRAVENOUS
  Filled 2021-09-05 (×2): qty 4

## 2021-09-05 MED ORDER — ORAL CARE MOUTH RINSE: 15.0000 mL | OROMUCOSAL | Status: DC | PRN

## 2021-09-05 NOTE — Progress Notes (Signed)
Progress Note  Patient Name: Emily Robertson Date of Encounter: 09/05/2021  [facility] Cardiologist: Olga Millers, MD   Subjective   Feeling well.  Breathing improved.   Inpatient Medications    Scheduled Meds:  apixaban  2.5 mg Oral BID   diltiazem  180 mg Oral Daily   famotidine  20 mg Oral BID   feeding supplement  237 mL Oral TID BM   furosemide  40 mg Intravenous Daily   latanoprost  1 drop Left Eye QHS   leflunomide  10 mg Oral q morning   loratadine  10 mg Oral Daily   methimazole  10 mg Oral BID   metoprolol succinate  150 mg Oral q morning   montelukast  10 mg Oral QPM   multivitamin with minerals  1 tablet Oral q morning   omega-3 acid ethyl esters  1 g Oral Daily   predniSONE  2.5 mg Oral Q breakfast   timolol  1 drop Both Eyes BID   Continuous Infusions:  sodium chloride     sodium chloride 20 mL/hr at 09/05/21 0830   PRN Meds: acetaminophen **OR** acetaminophen, ondansetron **OR** ondansetron (ZOFRAN) IV, mouth rinse   Vital Signs    Vitals:   09/04/21 1316 09/04/21 2016 09/05/21 0500 09/05/21 0535  BP: 115/70 116/68  118/66  Pulse: 94 92  97  Resp: 20 18  18   Temp: 97.6 F (36.4 C) 98.2 F (36.8 C)  98.1 F (36.7 C)  TempSrc: Oral Oral  Oral  SpO2: 96% 94%  92%  Weight:   48.8 kg   Height:        Intake/Output Summary (Last 24 hours) at 09/05/2021 1006 Last data filed at 09/05/2021 0030 Gross per 24 hour  Intake 600 ml  Output 653 ml  Net -53 ml      09/05/2021    5:00 AM 09/04/2021    5:00 AM 09/03/2021    9:05 PM  Last 3 Weights  Weight (lbs) 107 lb 9.4 oz 106 lb 0.7 oz 106 lb 0.7 oz  Weight (kg) 48.8 kg 48.1 kg 48.1 kg      Telemetry    Atrial fibrillation.  Rate mostly 90s.  - Personally Reviewed  ECG    N/a - Personally Reviewed  Physical Exam   VS:  BP 118/66 (BP Location: Right Arm)   Pulse 97   Temp 98.1 F (36.7 C) (Oral)   Resp 18   Ht 5\' 4"  (1.626 m)   Wt 48.8 kg   SpO2 92%   BMI 18.47 kg/m  , BMI  Body mass index is 18.47 kg/m. GENERAL:  Well appearing.  Frail.  HEENT: Pupils equal round and reactive, fundi not visualized, oral mucosa unremarkable NECK:  No jugular venous distention, waveform within normal limits, carotid upstroke brisk and symmetric, no bruits, no thyromegaly LUNGS:  Clear to auscultation bilaterally HEART:  Irregularly irregular.  PMI not displaced or sustained,S1 and S2 within normal limits, no S3, no S4, no clicks, no rubs, III/VI holosystolic murmur at the apex ABD:  Flat, positive bowel sounds normal in frequency in pitch, no bruits, no rebound, no guarding, no midline pulsatile mass, no hepatomegaly, no splenomegaly EXT:  2 plus pulses throughout, no edema, no cyanosis no clubbing SKIN:  No rashes no nodules NEURO:  Cranial nerves II through XII grossly intact, motor grossly intact throughout PSYCH:  Cognitively intact, oriented to person place and time   Labs    High Sensitivity Troponin:  Recent Labs  Lab 09/03/21 1100 09/03/21 2111  TROPONINIHS 24* 20*     Chemistry Recent Labs  Lab 09/03/21 1100 09/03/21 2111 09/04/21 0018 09/04/21 0430 09/05/21 0453  NA 127* 131* 131* 135 133*  K 4.6 4.0 4.2 3.6 3.6  CL 95* 92* 95* 96* 95*  CO2 21* 28 23 28 30   GLUCOSE 213* 169* 119* 105* 102*  BUN 21 19 17 17 23   CREATININE 0.65 0.93 0.76 0.87 0.86  CALCIUM 8.9 9.1 8.6* 8.9 9.1  PROT 6.2*  --   --   --   --   ALBUMIN 3.4* 3.9 3.2* 3.1*  --   AST 55*  --   --   --   --   ALT 159*  --   --   --   --   ALKPHOS 456*  --   --   --   --   BILITOT 1.5* 1.2  --   --   --   GFRNONAA >60 59* >60 >60 >60  ANIONGAP 11 11 13 11 8     Lipids No results for input(s): "CHOL", "TRIG", "HDL", "LABVLDL", "LDLCALC", "CHOLHDL" in the last 168 hours.  Hematology Recent Labs  Lab 09/03/21 1100 09/04/21 0018  WBC 12.5* 11.9*  RBC 4.10 4.01  HGB 12.1 12.0  HCT 36.2 36.2  MCV 88.3 90.3  MCH 29.5 29.9  MCHC 33.4 33.1  RDW 15.5 15.9*  PLT 266 236   Thyroid No  results for input(s): "TSH", "FREET4" in the last 168 hours.  BNP Recent Labs  Lab 09/03/21 0935  BNP 1,182.0*    DDimer No results for input(s): "DDIMER" in the last 168 hours.   Radiology    ECHOCARDIOGRAM LIMITED  Result Date: 09/04/2021    ECHOCARDIOGRAM LIMITED REPORT   Patient Name:   Emily Robertson Date of Exam: 09/04/2021 Medical Rec #:  09/06/2021         Height:       64.0 in Accession #:    Lisbeth Renshaw        Weight:       106.0 lb Date of Birth:  15-Jun-1930         BSA:          1.494 m Patient Age:    88 years          BP:           114/82 mmHg Patient Gender: F                 HR:           84 bpm. Exam Location:  Inpatient Procedure: 3D Echo, Limited Echo, Cardiac Doppler and Color Doppler Indications:    I50.40* Unspecified combined systolic (congestive) and diastolic                 (congestive) heart failure; I34.0 Nonrheumatic mitral (valve)                 insufficiency  History:        Patient has prior history of Echocardiogram examinations, most                 recent 07/26/2021. CHF, Abnormal ECG, Mitral Valve Disease,                 Arrythmias:Atrial Fibrillation, Signs/Symptoms:Shortness of                 Breath and Dyspnea; Risk Factors:Dyslipidemia and Hypertension.  Sonographer:  Sheralyn Boatman RDCS Referring Phys: [id number] ANGELA NICOLE DUKE IMPRESSIONS  1. Left ventricular ejection fraction, by estimation, is 50 to 55%. The left ventricle has low normal function. There is moderate concentric left ventricular hypertrophy.  2. Left atrial size was severely dilated.  3. Right atrial size was severely dilated.  4. Eccentric anteriorly directed jet. Posterior leaflet appears prolapsed. The mitral valve is degenerative. Moderate to severe mitral valve regurgitation.  5. Tricuspid valve regurgitation is mild to moderate.  6. Aortic valve regurgitation is trivial.  7. There is moderately elevated pulmonary artery systolic pressure. Comparison(s): No significant change from prior  study. Conclusion(s)/Recommendation(s): Recommend TEE for better assessment of MR etiology and severity. FINDINGS  Left Ventricle: Left ventricular ejection fraction, by estimation, is 50 to 55%. The left ventricle has low normal function. There is moderate concentric left ventricular hypertrophy. Right Ventricle: There is moderately elevated pulmonary artery systolic pressure. The tricuspid regurgitant velocity is 2.78 m/s, and with an assumed right atrial pressure of 15 mmHg, the estimated right ventricular systolic pressure is 45.9 mmHg. Left Atrium: Left atrial size was severely dilated. Right Atrium: Right atrial size was severely dilated. Mitral Valve: Eccentric anteriorly directed jet. Posterior leaflet appears prolapsed. The mitral valve is degenerative in appearance. Moderate to severe mitral valve regurgitation. Tricuspid Valve: Tricuspid valve regurgitation is mild to moderate. Aortic Valve: Aortic valve regurgitation is trivial. LEFT VENTRICLE PLAX 2D LVIDd:         3.70 cm LVIDs:         2.80 cm LV PW:         1.40 cm LV IVS:        1.40 cm                            3D Volume EF:                            3D EF:        52 % LV Volumes (MOD)           LV EDV:       87 ml LV vol d, MOD A2C: 74.6 ml LV ESV:       42 ml LV vol d, MOD A4C: 60.5 ml LV SV:        46 ml LV vol s, MOD A2C: 38.8 ml LV vol s, MOD A4C: 32.1 ml LV SV MOD A2C:     35.8 ml LV SV MOD A4C:     60.5 ml LV SV MOD BP:      31.8 ml IVC IVC diam: 2.00 cm LEFT ATRIUM              Index LA diam:        3.95 cm  2.64 cm/m LA Vol (A2C):   98.2 ml  65.73 ml/m LA Vol (A4C):   118.0 ml 78.98 ml/m LA Biplane Vol: 110.0 ml 73.63 ml/m   AORTA Ao Root diam: 3.20 cm MR Peak grad:    81.5 mmHg    TRICUSPID VALVE MR Mean grad:    48.0 mmHg    TR Peak grad:   30.9 mmHg MR Vmax:         451.50 cm/s  TR Vmax:        278.00 cm/s MR Vmean:        315.0 cm/s MR PISA:  3.08 cm MR PISA Eff ROA: 26 mm MR PISA Radius:  0.70 cm Emily Robertson  Electronically signed by Emily Robertson Signature Date/Time: 09/04/2021/12:20:21 PM    Final    US Abdomen Limited RUQ (LIVER/GB)  Result Date: 09/03/2021 CLINICAL DATA:  Elevated liver function studies EXAM: ULTRASOUND ABDOMEN LIMITED RIGHT UPPER QUADRANT COMPARISON:  None Available. FINDINGS: Gallbladder: No gallstones or wall thickening visualized. No sonographic Murphy sign noted by sonographer. Common bile duct: Diameter: 3 mm, normal Liver: No focal lesion identified. Within normal limits in parenchymal echogenicity. Portal vein is patent on color Doppler imaging with normal direction of blood flow towards the liver. Other: A small right pleural effusion is identified. IMPRESSION: 1. No evidence of cholelithiasis or acute cholecystitis. 2. Right pleural effusion. Electronically Signed   By: Burman Nieves M.D.   On: 09/03/2021 17:49   CT Chest Wo Contrast  Result Date: 09/03/2021 CLINICAL DATA:  Weakness, fatigue EXAM: CT CHEST WITHOUT CONTRAST TECHNIQUE: Multidetector CT imaging of the chest was performed following the standard protocol without IV contrast. RADIATION DOSE REDUCTION: This exam was performed according to the departmental dose-optimization program which includes automated exposure control, adjustment of the mA and/or kV according to patient size and/or use of iterative reconstruction technique. COMPARISON:  CT 01/12/2021 FINDINGS: Cardiovascular: Heart size is mildly enlarged. No pericardial effusion. Thoracic aorta is nonaneurysmal. Atherosclerotic calcifications of the aorta and coronary arteries. Mediastinum/Nodes: No axillary lymphadenopathy. Redemonstration mildly enlarged mediastinal lymph nodes including 10 mm right paratracheal node (series 2, image 52) and 15 mm precarinal node (series 2, image 61). Evaluation of the hilar structures is limited in the absence of intravenous contrast. Within this limitation, no obvious hilar adenopathy or mass is identified. Thyroid gland, trachea,  and esophagus demonstrate no significant findings. Lungs/Pleura: Small-moderate-sized layering bilateral pleural effusions with associated compressive atelectasis. Extensive biapical pleuroparenchymal scarring. Ground-glass nodular density at the right lung apex measures approximately 2.1 x 1.8 cm (series 7, image 32), previously approximately 1.4 x 1.0 cm. Upper Abdomen: No acute abnormality. Musculoskeletal: Multilevel spondylosis of the thoracic spine and imaged lower cervical spine. Bones appear demineralized. No acute bony findings. No chest wall abnormality. IMPRESSION: 1. Small-moderate-sized layering bilateral pleural effusions with associated compressive atelectasis. 2. Ground-glass nodular density at the right lung apex measuring approximately 2.1 x 1.8 cm, previously measured 1.4 x 1.0 cm. Adenocarcinoma cannot be excluded. PET-CT and/or tissue sampling should be considered with patient's age and functional status taken into consideration. 3. Redemonstration of mildly enlarged mediastinal lymph nodes, which may be reactive. 4. Aortic and coronary artery atherosclerosis. Aortic Atherosclerosis (ICD10-I70.0). Electronically Signed   By: Duanne Guess D.O.   On: 09/03/2021 13:15    Cardiac Studies   Echo 09/05/21:  1. Left ventricular ejection fraction, by estimation, is 50 to 55%. The  left ventricle has low normal function. There is moderate concentric left  ventricular hypertrophy.   2. Left atrial size was severely dilated.   3. Right atrial size was severely dilated.   4. Eccentric anteriorly directed jet. Posterior leaflet appears  prolapsed. The mitral valve is degenerative. Moderate to severe mitral  valve regurgitation.   5. Tricuspid valve regurgitation is mild to moderate.   6. Aortic valve regurgitation is trivial.   7. There is moderately elevated pulmonary artery systolic pressure.   Patient Profile     Ms. Uriegas is an 6F with persistent atrial fibrillation,  hypertension, hyperlipidemia, hyperthyroidism, and rheumatoid arthritis admitted with atrial fibrillation and acute heart failure.  Assessment &  Plan    # Persistent atrial fibrillation:  She remains in atrial fibrillation.  Rates are better controlled on diltiazem and metoprolol.  I think it would be hard to get her to maintain sinus rhythm until her thyroid is better controlled.  Continue methimazole.  We will not attempt cardioversion again until she is euthyroid.  #Moderate to severe affect regurgitation: She is noted to have moderate to severe mitral regurgitation.  Her echo yesterday there was concern that it may be more severe given that she also has colon defect.  She is going for TEE today to better evaluate.  Could consider for Mitra-Clip procedure as an outpatient.  # Acute on chronic diastolic heart failure:  LVEF this admission 50-55%, down from prior.  This may be due to atrial fibrillation with RVR.  Rates are better controlled as above.  Yesterday she had a positive fluid balance and her weight is up.  She still has crackles on exam.  Oxygen requirement is improving.  Renal function has been stable so we will increase her Lasix to 40 mg.  # Pulmonary nodule:  Per Pulmonary.  No plans for inpatient procedures.       For questions or updates, please contact [facility] Please consult [email or web address] for contact info under        Signed, Chilton Si, MD  09/05/2021, 10:06 AM

## 2021-09-05 NOTE — Anesthesia Postprocedure Evaluation (Signed)
Anesthesia Post Note  Patient: Emily Robertson  Procedure(s) Performed: TRANSESOPHAGEAL ECHOCARDIOGRAM (TEE)     Patient location during evaluation: PACU Anesthesia Type: MAC Level of consciousness: awake and alert Pain management: pain level controlled Vital Signs Assessment: post-procedure vital signs reviewed and stable Respiratory status: spontaneous breathing, nonlabored ventilation, respiratory function stable and patient connected to nasal cannula oxygen Cardiovascular status: stable and blood pressure returned to baseline Postop Assessment: no apparent nausea or vomiting Anesthetic complications: no   No notable events documented.  Last Vitals:  Vitals:   09/05/21 1120 09/05/21 1159  BP: 99/80 (!) 157/89  Pulse: (!) 102 94  Resp: 13 (!) 22  Temp:  (!) 36.4 C  SpO2: 93% 94%    Last Pain:  Vitals:   09/05/21 1159  TempSrc: Oral  PainSc:                  Emily Robertson

## 2021-09-05 NOTE — Progress Notes (Signed)
  Echocardiogram Echocardiogram Transesophageal has been performed.  Janalyn Harder 09/05/2021, 11:05 AM

## 2021-09-05 NOTE — Progress Notes (Signed)
Events note. CCM will sign off. Will set op followup for nodule    SIGNATURE    Dr. Kalman Shan, M.D., F.C.C.P,  Pulmonary and Critical Care Medicine Staff Physician, [facility] Director - Interstitial Lung Disease  Program  Medical Director - [facility] Pulmonary Fibrosis [facility] at [place], [place], [address]  NPI Number:  NPI #[id number] [facility] Number: [id number]  Pager: [phone], If no answer  -> Check AMION or Try [phone] Telephone (clinical office): [phone] Telephone (research): [phone]  4:00 PM 09/05/2021

## 2021-09-05 NOTE — Progress Notes (Signed)
Physical Therapy Treatment Patient Details Name: Emily Robertson MRN: [id number] DOB: [birth year] Today's Date: 09/05/2021   History of Present Illness 88 yo female admitted with HTN, acute HF. Hx of A fib with RVR, pelvic fx, OA, RA    PT Comments    Pt assisted with ambulating in hallway and then used bathroom prior to returning to bed.  Pt asking about when she will d/c home (discussed this is up to her doctors) and states she feels ready.    Recommendations for follow up therapy are one component of a multi-disciplinary discharge planning process, led by the attending physician.  Recommendations may be updated based on patient status, additional functional criteria and insurance authorization.  Follow Up Recommendations  Home health PT (home health aide if possible)     Assistance Recommended at Discharge Intermittent Supervision/Assistance  Patient can return home with the following A little help with walking and/or transfers;A little help with bathing/dressing/bathroom;Assistance with cooking/housework;Assist for transportation;Help with stairs or ramp for entrance   Equipment Recommendations  None recommended by PT    Recommendations for Other Services       Precautions / Restrictions Precautions Precautions: Fall     Mobility  Bed Mobility Overal bed mobility: Needs Assistance Bed Mobility: Supine to Sit, Sit to Supine     Supine to sit: Supervision, HOB elevated Sit to supine: Supervision        Transfers Overall transfer level: Needs assistance Equipment used: Rolling walker (2 wheels) Transfers: Sit to/from Stand Sit to Stand: Min guard           General transfer comment: verbal cues for hand placement to self assist    Ambulation/Gait Ambulation/Gait assistance: Min guard Gait Distance (Feet): 160 Feet Assistive device: Rolling walker (2 wheels) Gait Pattern/deviations: Step-through pattern, Decreased stride length       General Gait  Details: verbal cues for RW positioning and posture, pt denies any symptoms   Stairs             Wheelchair Mobility    Modified Rankin (Stroke Patients Only)       Balance                                            Cognition Arousal/Alertness: Awake/alert Behavior During Therapy: WFL for tasks assessed/performed Overall Cognitive Status: Within Functional Limits for tasks assessed                                          Exercises      General Comments        Pertinent Vitals/Pain Pain Assessment Pain Assessment: No/denies pain    Home Living                          Prior Function            PT Goals (current goals can now be found in the care plan section) Progress towards PT goals: Progressing toward goals    Frequency    Min 3X/week      PT Plan Current plan remains appropriate    Co-evaluation              AM-PAC PT "6 Clicks" Mobility   Outcome Measure  Help needed turning from your back to your side while in a flat bed without using bedrails?: None Help needed moving from lying on your back to sitting on the side of a flat bed without using bedrails?: None Help needed moving to and from a bed to a chair (including a wheelchair)?: A Little Help needed standing up from a chair using your arms (e.g., wheelchair or bedside chair)?: A Little Help needed to walk in hospital room?: A Little Help needed climbing 3-5 steps with a railing? : A Little 6 Click Score: 20    End of Session Equipment Utilized During Treatment: Gait belt Activity Tolerance: Patient tolerated treatment well Patient left: with call bell/phone within reach;in bed   PT Visit Diagnosis: Muscle weakness (generalized) (M62.81);Difficulty in walking, not elsewhere classified (R26.2)     Time: 1446-1500 PT Time Calculation (min) (ACUTE ONLY): 14 min  Charges:  $Gait Training: 8-22 mins                    [name] PT,  DPT Physical Therapist Acute Rehabilitation Services Preferred contact method: Secure Chat Weekend Pager Only: [phone] Office: [phone]    [name] 09/05/2021, 4:00 PM

## 2021-09-05 NOTE — Plan of Care (Signed)
  Problem: Education: Goal: Knowledge of General Education information will improve Description: Including pain rating scale, medication(s)/side effects and non-pharmacologic comfort measures Outcome: Progressing   Problem: Health Behavior/Discharge Planning: Goal: Ability to manage health-related needs will improve Outcome: Progressing   Problem: Clinical Measurements: Goal: Ability to maintain clinical measurements within normal limits will improve Outcome: Progressing Goal: Will remain free from infection Outcome: Progressing Goal: Diagnostic test results will improve Outcome: Progressing Goal: Respiratory complications will improve Outcome: Progressing Goal: Cardiovascular complication will be avoided Outcome: Progressing   Problem: Activity: Goal: Risk for activity intolerance will decrease Outcome: Progressing   Problem: Nutrition: Goal: Adequate nutrition will be maintained Outcome: Progressing   Problem: Coping: Goal: Level of anxiety will decrease Outcome: Progressing   Problem: Pain Managment: Goal: General experience of comfort will improve Outcome: Progressing   Problem: Safety: Goal: Ability to remain free from injury will improve Outcome: Progressing   Problem: Skin Integrity: Goal: Risk for impaired skin integrity will decrease Outcome: Progressing   Problem: Education: Goal: Ability to demonstrate management of disease process will improve Outcome: Progressing Goal: Ability to verbalize understanding of medication therapies will improve Outcome: Progressing   Problem: Activity: Goal: Capacity to carry out activities will improve Outcome: Progressing   Problem: Cardiac: Goal: Ability to achieve and maintain adequate cardiopulmonary perfusion will improve Outcome: Progressing   Problem: Elimination: Goal: Will not experience complications related to bowel motility Outcome: Completed/Met Goal: Will not experience complications related to  urinary retention Outcome: Completed/Met

## 2021-09-05 NOTE — Anesthesia Preprocedure Evaluation (Addendum)
Anesthesia Evaluation  Patient identified by MRN, date of birth, ID band Patient awake    Reviewed: Allergy & Precautions, H&P , NPO status , Patient's Chart, lab work & pertinent test results, reviewed documented beta blocker date and time   Airway Mallampati: II  TM Distance: >3 FB Neck ROM: full    Dental no notable dental hx. (+) Teeth Intact, Dental Advisory Given   Pulmonary neg pulmonary ROS,    Pulmonary exam normal breath sounds clear to auscultation       Cardiovascular hypertension, Pt. on home beta blockers and Pt. on medications +CHF  + dysrhythmias Atrial Fibrillation  Rhythm:regular Rate:Normal     Neuro/Psych glaucoma negative neurological ROS  negative psych ROS   GI/Hepatic negative GI ROS, Neg liver ROS,   Endo/Other  Hyperthyroidism   Renal/GU negative Renal ROS  negative genitourinary   Musculoskeletal  (+) Arthritis , Osteoarthritis,    Abdominal   Peds  Hematology negative hematology ROS (+)   Anesthesia Other Findings   Reproductive/Obstetrics negative OB ROS                            Anesthesia Physical  Anesthesia Plan  ASA: 3  Anesthesia Plan: MAC   Post-op Pain Management: Minimal or no pain anticipated   Induction: Intravenous  PONV Risk Score and Plan: 3 and Propofol infusion  Airway Management Planned: Natural Airway  Additional Equipment: None  Intra-op Plan:   Post-operative Plan:   Informed Consent: I have reviewed the patients History and Physical, chart, labs and discussed the procedure including the risks, benefits and alternatives for the proposed anesthesia with the patient or authorized representative who has indicated his/her understanding and acceptance.     Dental Advisory Given  Plan Discussed with: Anesthesiologist and CRNA  Anesthesia Plan Comments: (HPI: Emily Robertson is a 88 y.o. female with medical history  significant of a fib, HLD, glaucoma, OA, RA, hyperthyroidism. Presenting with dyspnea. Symptoms started 2 days ago. )       Anesthesia Quick Evaluation

## 2021-09-05 NOTE — Op Note (Signed)
INDICATIONS: Mitral insufficiency  PROCEDURE:   Informed consent was obtained prior to the procedure. The risks, benefits and alternatives for the procedure were discussed and the patient comprehended these risks.  Risks include, but are not limited to, cough, sore throat, vomiting, nausea, somnolence, esophageal and stomach trauma or perforation, bleeding, low blood pressure, aspiration, pneumonia, infection, trauma to the teeth and death.    After a procedural time-out, the oropharynx was anesthetized with 20% benzocaine spray.   During this procedure the patient was administered IV propofol by Anesthesiology, Dr. Tacy Dura.  The transesophageal probe was inserted in the esophagus and stomach without difficulty and multiple views were obtained.  The patient was kept under observation until the patient left the procedure room.  The patient left the procedure room in stable condition.   Agitated microbubble saline contrast was not administered.  COMPLICATIONS:    There were no immediate complications.  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 There is flail motion of a relatively short segment of the P2 scallop. There is highly eccentric moderate to severe mitral insufficiency. There is no systolic flow reversal in the pulmonary veins. Normal left ventricular systolic function. Mild AI, mild TR, normal PAP. No pericardial effusion.  RECOMMENDATIONS:     Consider MitraClip.  Time Spent Directly with the Patient:  45 minutes   Emily Robertson 09/05/2021, 10:53 AM

## 2021-09-05 NOTE — Progress Notes (Addendum)
PROGRESS NOTE    Emily Robertson  OEV:[id number] DOB: 1930-09-30 DOA: 09/03/2021 PCP: Rogers Blocker, FNP   Brief Narrative:  HPI: Emily Robertson is a 88 y.o. female with medical history significant of a fib, HLD, glaucoma, OA, RA, hyperthyroidism presented with dyspnea x2 days, worse at night, associated with dry cough and nausea, decreased appetite.  Admitted for persistent A-fib, acute on chronic diastolic CHF, moderate to severe mitral insufficiency.  Cardiology consulted, s/p TEE, remains on IV Lasix.  Assessment & Plan:   Principal Problem:   Acute on chronic diastolic CHF (congestive heart failure) (HCC) Active Problems:   HTN (hypertension)   Hyperthyroidism   Osteoporosis   Rheumatoid arthritis (HCC)   Paroxysmal atrial fibrillation (HCC)   Dyspnea   Hyponatremia   Elevated LFTs   Leukocytosis   Glaucoma   Lung nodule   Pleural effusion   Protein-calorie malnutrition, severe   Nonrheumatic mitral valve regurgitation  Acute on chronic congestive heart failure with preserved ejection fraction/moderate to severe MR: BNP 1182.  Recent echo showed 60 to 65% ejection fraction.  Chest x-ray this time with bilateral pleural effusion and groundglass nodular opacity in the right lung apex, cannot exclude adenocarcinoma.  Cardiology consulted.  Limited echo 7/25: LVEF 50-55%, moderate concentric left ventricular hypertrophy, moderate to severe mitral valve regurgitation.  Findings reported as no significant change from prior study.  S/p TEE 7/26 and Cardiology are considering mitral clip, as outpatient.  Cardiology of increased IV Lasix to 40 mg daily.  Persistent atrial fibrillation with RVR: Patient was recently diagnosed with A-fib during her recent hospitalization last month.  She is on Eliquis as well.  Her heart rate appears to be controlled at current regimen of diltiazem and metoprolol.  A-fib RVR likely driven by hyperthyroidism and severe MR.  Cardiology  following.  Moderate to severe mitral regurgitation: Limited echo this admission suggested that.  Underwent TEE 7/26 which confirms highly eccentric moderate to severe mitral insufficiency, normal LV systolic function and Cardiology recommends mitral clip for which she will need outpatient follow-up.  Hyperthyroidism: Confirmed in June 2023 during recent hospitalization due to pelvic fracture.  On methimazole.  On 07/26/2021, TSH 0.010, free T3: 5.8, free T4 :3.62, thyroid peroxidase antibody: Elevated at 287 and thyroglobulin antibody negative.  TSH now normal.  Free T4 and free T3 repeat are pending.  S/p radioactive iodine several years ago.  Recommend outpatient follow-up with endocrinology.  Elevated LFTs: Ultrasound abdomen unremarkable.  Viral hepatitis panel negative as well.  Unfortunately no LFTs were ordered for today, will check in AM.  Leukocytosis: Nonspecific.  No signs of infection.  Improving.  Follow CBC in AM.  Acute hyponatremia: Work-up indicates SIADH, could be CHF likely, or due to lung nodule/pathology.  Serum sodium stable in the low 130s.  Hyperlipidemia: Held statin due to elevated LFTs.  Rheumatoid arthritis: Continue home regimen.  RUL pulmonary nodule: Pulmonology consulted and they are detailed consult note from 7/25 appreciated.  They plan to evaluate this as outpatient with a PET scan and research study.  Pulmonology will arrange outpatient follow-up.  DVT prophylaxis: apixaban (ELIQUIS) tablet 2.5 mg Start: 09/03/21 2200   Code Status: Full Code  Family Communication: Discussed in detail with patient's son at bedside. Disposition: DC home pending CHF optimization, pending cardiology clearance, could be even as early as 7/27  Estimated body mass index is 18.47 kg/m as calculated from the following:   Height as of this encounter: 5\' 4"  (1.626 m).  Weight as of this encounter: 48.8 kg.    Nutritional Assessment: Body mass index is 18.47 kg/m.Marland Kitchen Seen by  dietician.  I agree with the assessment and plan as outlined below: Nutrition Status: Nutrition Problem: Severe Malnutrition Etiology: chronic illness Signs/Symptoms: severe fat depletion, severe muscle depletion Interventions: Ensure Enlive (each supplement provides 350kcal and 20 grams of protein), MVI, Refer to RD note for recommendations   Consultants:  Cardiology Pulmonology  Procedures:  TEE 7/26  Antimicrobials:  Anti-infectives (From admission, onward)    None         Subjective: Unable to see patient this morning because she was at [facility] for procedure/TEE.  Seen her this afternoon.  Reports feeling tired due to travel for procedure and being n.p.o. this morning.  Ate lunch late at around 2 PM.  Denies dyspnea, pain or orthopnea.  Objective: Vitals:   09/05/21 1100 09/05/21 1110 09/05/21 1120 09/05/21 1159  BP: 109/61 115/69 99/80 (!) 157/89  Pulse: 95 99 (!) 102 94  Resp: (!) 23 (!) 25 13 (!) 22  Temp:    (!) 97.5 F (36.4 C)  TempSrc:    Oral  SpO2: 92% 95% 93% 94%  Weight:      Height:        Intake/Output Summary (Last 24 hours) at 09/05/2021 1615 Last data filed at 09/05/2021 1400 Gross per 24 hour  Intake 801.96 ml  Output 853 ml  Net -51.04 ml   Filed Weights   09/03/21 2105 09/04/21 0500 09/05/21 0500  Weight: 48.1 kg 48.1 kg 48.8 kg    Examination:  General exam: Elderly female, small built and frail, lying comfortably supine in bed without distress. Respiratory system: Occasional basal crackles but otherwise clear to auscultation.  No increased work of breathing. Cardiovascular system: S1 and S2 heard, irregularly irregular.?  JVD.  No pedal edema.  3/6 systolic murmur best heard at apex.  Telemetry personally reviewed: A-fib with controlled ventricular rate. Gastrointestinal system: Abdomen is nondistended, soft and nontender. No organomegaly or masses felt. Normal bowel sounds heard. Central nervous system: Alert and oriented. No focal  neurological deficits. Extremities: Symmetric 5 x 5 power. Skin: No rashes, lesions or ulcers Psychiatry: Judgement and insight appear normal. Mood & affect appropriate.    Data Reviewed: I have personally reviewed following labs and imaging studies  CBC: Recent Labs  Lab 09/03/21 1100 09/04/21 0018  WBC 12.5* 11.9*  NEUTROABS 10.9*  --   HGB 12.1 12.0  HCT 36.2 36.2  MCV 88.3 90.3  PLT 266 236   Basic Metabolic Panel: Recent Labs  Lab 09/03/21 1100 09/03/21 2111 09/04/21 0018 09/04/21 0430 09/05/21 0453  NA 127* 131* 131* 135 133*  K 4.6 4.0 4.2 3.6 3.6  CL 95* 92* 95* 96* 95*  CO2 21* GLUCOSE 213* 169* 119* 105* 102*  BUN CREATININE 0.65 0.93 0.76 0.87 0.86  CALCIUM 8.9 9.1 8.6* 8.9 9.1  PHOS  --  3.2 3.3 3.2  --    GFR: Estimated Creatinine Clearance: 34.2 mL/min (by C-G formula based on SCr of 0.86 mg/dL). Liver Function Tests: Recent Labs  Lab 09/03/21 1100 09/03/21 2111 09/04/21 0018 09/04/21 0430  AST 55*  --   --   --   ALT 159*  --   --   --   ALKPHOS 456*  --   --   --   BILITOT 1.5* 1.2  --   --  PROT 6.2*  --   --   --   ALBUMIN 3.4* 3.9 3.2* 3.1*    Coagulation Profile: Recent Labs  Lab 09/04/21 2036  INR 1.1   HbA1C: Recent Labs    09/03/21 2111  HGBA1C 5.7*    Thyroid Function Tests: Recent Labs    09/05/21 1319  TSH 1.062    Sepsis Labs: Recent Labs  Lab 09/03/21 2111  PROCALCITON 0.10    No results found for this or any previous visit (from the past 240 hour(s)).   Radiology Studies: ECHO TEE  Result Date: 09/05/2021    TRANSESOPHOGEAL ECHO REPORT   Patient Name:   Emily Robertson Date of Exam: 09/05/2021 Medical Rec #:  [id number]         Height:       64.0 in Accession #:    [id number]        Weight:       107.6 lb Date of Birth:  01-29-1931         BSA:          1.503 m Patient Age:    89 years          BP:           143/85 mmHg Patient Gender: F                 HR:           98  bpm. Exam Location:  Inpatient Procedure: Transesophageal Echo, 3D Echo, Cardiac Doppler and Color Doppler Indications:     I34.0 Nonrheumatic mitral (valve) insufficiency  History:         Patient has prior history of Echocardiogram examinations. CHF,                  Abnormal ECG, Mitral Valve Disease, Arrythmias:Atrial                  Fibrillation, Signs/Symptoms:Dyspnea and Shortness of Breath;                  Risk Factors:Sleep Apnea.  Sonographer:     Sheralyn Boatman RDCS Referring Phys:  [id number] [facility]ICOLE [facility] Diagnosing Phys: Thurmon Fair MD PROCEDURE: After discussion of the risks and benefits of a TEE, an informed consent was obtained from the patient. The transesophogeal probe was passed without difficulty through the esophogus of the patient. Imaged were obtained with the patient in a left lateral decubitus position. Sedation performed by different physician. The patient was monitored while under deep sedation. Anesthestetic sedation was provided intravenously by Anesthesiology:  of Propofol. The patient's vital signs; including heart rate, blood pressure, and oxygen saturation; remained stable throughout the procedure. The patient developed no complications during the procedure. IMPRESSIONS  1. Left ventricular ejection fraction, by estimation, is 65 to 70%. The left ventricle has normal function. The left ventricle has no regional wall motion abnormalities. Left ventricular diastolic function could not be evaluated.  2. Right ventricular systolic function is normal. The right ventricular size is normal.  3. Left atrial size was severely dilated. No left atrial/left atrial appendage thrombus was detected. The LAA emptying velocity was 27 cm/s.  4. Right atrial size was mildly dilated.  5. A single ruptured chorda tendina is seen, attached to the mid portion of P2 (middle) scallop of the posterior leaflet. There is a fairly short arc of the P2 scallop with flail motion. Flail gap 5 mm, flail width  9 mm. Posterior leaflet length  15 mm, valve area 6 cm sq, mean gradient 2 mm Hg at 111 bpm. MR 2D vena contracta is 5 mm. 3D guided effective regurgitant orifice area measures 0.34 cm sq, regurgitant volume 45 mL. There is blunted, but antegrade systolic flow in the right and left pulmonary veins. Due to atrial fibrillation, unable to accurately assess via PISA method. The mitral valve is myxomatous. Moderate to severe mitral valve regurgitation. No evidence of mitral stenosis.  6. The tricuspid valve is myxomatous.  7. The aortic valve is tricuspid. Aortic valve regurgitation is mild.  8. There is mild (Grade II) plaque involving the ascending aorta. FINDINGS  Left Ventricle: Left ventricular ejection fraction, by estimation, is 65 to 70%. The left ventricle has normal function. The left ventricle has no regional wall motion abnormalities. The left ventricular internal cavity size was normal in size. There is  no left ventricular hypertrophy. Left ventricular diastolic function could not be evaluated due to atrial fibrillation. Left ventricular diastolic function could not be evaluated. Right Ventricle: The right ventricular size is normal. No increase in right ventricular wall thickness. Right ventricular systolic function is normal. Left Atrium: Left atrial size was severely dilated. No left atrial/left atrial appendage thrombus was detected. The LAA emptying velocity was 27 cm/s. Right Atrium: Right atrial size was mildly dilated. Pericardium: There is no evidence of pericardial effusion. Mitral Valve: A single ruptured chorda tendina is seen, attached to the mid portion of P2 (middle) scallop of the posterior leaflet. There is a fairly short arc of the P2 scallop with flail motion. Flail gap 5 mm, flail width 9 mm. Posterior leaflet length 15 mm, valve area 6 cm sq, mean gradient 2 mm Hg at 111 bpm. MR 2D vena contracta is 5 mm. 3D guided effective regurgitant orifice area measures 0.34 cm sq, regurgitant  volume 45 mL. There is blunted, but antegrade systolic flow in the right and left pulmonary veins. Due to atrial fibrillation, unable to accurately assess via PISA method. The mitral valve is myxomatous. Moderate to severe mitral valve regurgitation, with eccentric anteriorly directed jet. No evidence of mitral valve stenosis. MV  peak gradient, 6.7 mmHg. The mean mitral valve gradient is 2.0 mmHg with average heart rate of 111 bpm. Tricuspid Valve: The tricuspid valve is myxomatous. Tricuspid valve regurgitation is mild. Aortic Valve: The aortic valve is tricuspid. Aortic valve regurgitation is mild. Pulmonic Valve: The pulmonic valve was normal in structure. Pulmonic valve regurgitation is mild. Aorta: The aortic root, ascending aorta and aortic arch are all structurally normal, with no evidence of dilitation or obstruction. There is mild (Grade II) plaque involving the ascending aorta. IAS/Shunts: No atrial level shunt detected by color flow Doppler.  MITRAL VALVE MV Peak grad: 6.7 mmHg MV Mean grad: 2.0 mmHg MV Vmax:      1.29 m/s MV Vmean:     68.4 cm/s MR Peak grad:    78.1 mmHg MR Mean grad:    50.0 mmHg MR Vmax:         442.00 cm/s MR Vmean:        335.0 cm/s MR PISA:         1.57 cm MR PISA Eff ROA: 13 mm MR PISA Radius:  0.50 cm Rachelle Hora Croitoru MD Electronically signed by Thurmon Fair MD Signature Date/Time: 09/05/2021/11:40:18 AM    Final    ECHOCARDIOGRAM LIMITED  Result Date: 09/04/2021    ECHOCARDIOGRAM LIMITED REPORT   Patient Name:   Emily Robertson Date of Exam:  09/04/2021 Medical Rec #:  [id number]         Height:       64.0 in Accession #:    [id number]        Weight:       106.0 lb Date of Birth:  23-Oct-1930         BSA:          1.494 m Patient Age:    89 years          BP:           114/82 mmHg Patient Gender: F                 HR:           84 bpm. Exam Location:  Inpatient Procedure: 3D Echo, Limited Echo, Cardiac Doppler and Color Doppler Indications:    I50.40* Unspecified combined  systolic (congestive) and diastolic                 (congestive) heart failure; I34.0 Nonrheumatic mitral (valve)                 insufficiency  History:        Patient has prior history of Echocardiogram examinations, most                 recent 07/26/2021. CHF, Abnormal ECG, Mitral Valve Disease,                 Arrythmias:Atrial Fibrillation, Signs/Symptoms:Shortness of                 Breath and Dyspnea; Risk Factors:Dyslipidemia and Hypertension.  Sonographer:    Sheralyn Boatman RDCS Referring Phys: [id number] [facility] IMPRESSIONS  1. Left ventricular ejection fraction, by estimation, is 50 to 55%. The left ventricle has low normal function. There is moderate concentric left ventricular hypertrophy.  2. Left atrial size was severely dilated.  3. Right atrial size was severely dilated.  4. Eccentric anteriorly directed jet. Posterior leaflet appears prolapsed. The mitral valve is degenerative. Moderate to severe mitral valve regurgitation.  5. Tricuspid valve regurgitation is mild to moderate.  6. Aortic valve regurgitation is trivial.  7. There is moderately elevated pulmonary artery systolic pressure. Comparison(s): No significant change from prior study. Conclusion(s)/Recommendation(s): Recommend TEE for better assessment of MR etiology and severity. FINDINGS  Left Ventricle: Left ventricular ejection fraction, by estimation, is 50 to 55%. The left ventricle has low normal function. There is moderate concentric left ventricular hypertrophy. Right Ventricle: There is moderately elevated pulmonary artery systolic pressure. The tricuspid regurgitant velocity is 2.78 m/s, and with an assumed right atrial pressure of 15 mmHg, the estimated right ventricular systolic pressure is 45.9 mmHg. Left Atrium: Left atrial size was severely dilated. Right Atrium: Right atrial size was severely dilated. Mitral Valve: Eccentric anteriorly directed jet. Posterior leaflet appears prolapsed. The mitral valve is degenerative in  appearance. Moderate to severe mitral valve regurgitation. Tricuspid Valve: Tricuspid valve regurgitation is mild to moderate. Aortic Valve: Aortic valve regurgitation is trivial. LEFT VENTRICLE PLAX 2D LVIDd:         3.70 cm LVIDs:         2.80 cm LV PW:         1.40 cm LV IVS:        1.40 cm                            3D  Volume EF:                            3D EF:        52 % LV Volumes (MOD)           LV EDV:       87 ml LV vol d, MOD A2C: 74.6 ml LV ESV:       42 ml LV vol d, MOD A4C: 60.5 ml LV SV:        46 ml LV vol s, MOD A2C: 38.8 ml LV vol s, MOD A4C: 32.1 ml LV SV MOD A2C:     35.8 ml LV SV MOD A4C:     60.5 ml LV SV MOD BP:      31.8 ml IVC IVC diam: 2.00 cm LEFT ATRIUM              Index LA diam:        3.95 cm  2.64 cm/m LA Vol (A2C):   98.2 ml  65.73 ml/m LA Vol (A4C):   118.0 ml 78.98 ml/m LA Biplane Vol: 110.0 ml 73.63 ml/m   AORTA Ao Root diam: 3.20 cm MR Peak grad:    81.5 mmHg    TRICUSPID VALVE MR Mean grad:    48.0 mmHg    TR Peak grad:   30.9 mmHg MR Vmax:         451.50 cm/s  TR Vmax:        278.00 cm/s MR Vmean:        315.0 cm/s MR PISA:         3.08 cm MR PISA Eff ROA: 26 mm MR PISA Radius:  0.70 cm Carolan Clines Electronically signed by Carolan Clines Signature Date/Time: 09/04/2021/12:20:21 PM    Final    US Abdomen Limited RUQ (LIVER/GB)  Result Date: 09/03/2021 CLINICAL DATA:  Elevated liver function studies EXAM: ULTRASOUND ABDOMEN LIMITED RIGHT UPPER QUADRANT COMPARISON:  None Available. FINDINGS: Gallbladder: No gallstones or wall thickening visualized. No sonographic Murphy sign noted by sonographer. Common bile duct: Diameter: 3 mm, normal Liver: No focal lesion identified. Within normal limits in parenchymal echogenicity. Portal vein is patent on color Doppler imaging with normal direction of blood flow towards the liver. Other: A small right pleural effusion is identified. IMPRESSION: 1. No evidence of cholelithiasis or acute cholecystitis. 2. Right pleural effusion.  Electronically Signed   By: Burman Nieves M.D.   On: 09/03/2021 17:49    Scheduled Meds:  apixaban  2.5 mg Oral BID   diltiazem  180 mg Oral Daily   famotidine  20 mg Oral BID   feeding supplement  237 mL Oral TID BM   furosemide  40 mg Intravenous Daily   latanoprost  1 drop Left Eye QHS   leflunomide  10 mg Oral q morning   loratadine  10 mg Oral Daily   methimazole  10 mg Oral BID   metoprolol succinate  150 mg Oral q morning   montelukast  10 mg Oral QPM   multivitamin with minerals  1 tablet Oral q morning   omega-3 acid ethyl esters  1 g Oral Daily   predniSONE  2.5 mg Oral Q breakfast   timolol  1 drop Both Eyes BID   Continuous Infusions:   LOS: 2 days   Marcellus Scott, MD,  FACP, [facility], [facility], [facility] (Care Management Physician Certified) [facility] Advisor [facility]  To contact the attending provider between 7A-7P or the covering provider during after hours 7P-7A, please log into the web site [email or web address] and access using universal [place] password for that web site. If you do not have the password, please call the hospital operator.

## 2021-09-05 NOTE — Telephone Encounter (Signed)
Pt scheduled for HFU with Dr Delton Coombes on 8/29. This will be printed on her discharge papers.

## 2021-09-05 NOTE — Telephone Encounter (Signed)
Front desk  Pls give followup in 4-8 weeks for followup on lung nodule. Icard/Byrum  Thanks    SIGNATURE    Dr. Kalman Shan, M.D., F.C.C.P,  Pulmonary and Critical Care Medicine Staff Physician, [facility] Director - Interstitial Lung Disease  Program  Medical Director - [facility] Pulmonary Fibrosis [facility] at [place], [place], [address]  NPI Number:  NPI #[id number] [facility] Number: [id number]  Pager: [phone], If no answer  -> Check AMION or Try [phone] Telephone (clinical office): [phone] Telephone (research): [phone]  4:00 PM 09/05/2021

## 2021-09-05 NOTE — Interval H&P Note (Signed)
History and Physical Interval Note:  09/05/2021 8:44 AM  Emily Robertson  has presented today for surgery, with the diagnosis of SEVER MITROVALVE REGURGITATIONT.  The various methods of treatment have been discussed with the patient and family. After consideration of risks, benefits and other options for treatment, the patient has consented to  Procedure(s): TRANSESOPHAGEAL ECHOCARDIOGRAM (TEE) (N/A) as a surgical intervention.  The patient's history has been reviewed, patient examined, no change in status, stable for surgery.  I have reviewed the patient's chart and labs.  Questions were answered to the patient's satisfaction.     Jewell Haught

## 2021-09-05 NOTE — Transfer of Care (Signed)
Immediate Anesthesia Transfer of Care Note  Patient: Emily Robertson  Procedure(s) Performed: TRANSESOPHAGEAL ECHOCARDIOGRAM (TEE)  Patient Location: Endoscopy Unit  Anesthesia Type:MAC  Level of Consciousness: drowsy  Airway & Oxygen Therapy: Patient Spontanous Breathing and Patient connected to nasal cannula oxygen  Post-op Assessment: Report given to RN  Post vital signs: Reviewed and stable  Last Vitals:  Vitals Value Taken Time  BP 109/69   Temp    Pulse 90   Resp 19 09/05/21 1055  SpO2 94   Vitals shown include unvalidated device data.  Last Pain:  Vitals:   09/05/21 1018  TempSrc: Temporal  PainSc: 0-No pain         Complications: No notable events documented.

## 2021-09-05 NOTE — Anesthesia Procedure Notes (Signed)
Procedure Name: MAC Date/Time: 09/05/2021 10:30 AM  Performed by: Wilburn Cornelia, CRNAPre-anesthesia Checklist: Patient identified, Emergency Drugs available, Suction available, Patient being monitored and Timeout performed Patient Re-evaluated:Patient Re-evaluated prior to induction Oxygen Delivery Method: Nasal cannula Placement Confirmation: positive ETCO2 and breath sounds checked- equal and bilateral Dental Injury: Teeth and Oropharynx as per pre-operative assessment

## 2021-09-05 NOTE — Progress Notes (Signed)
OT Cancellation Note  Patient Details Name: CYENNA REBELLO MRN: [id number] DOB: [birth year]   Cancelled Treatment:    Reason Eval/Treat Not Completed: Patient at procedure or test/ unavailable. Off floor for TEE. OT to follow up 7/27.  Kallie Edward OTR/L Supplemental OT, Department of rehab services [phone]  Burley Kopka R H. 09/05/2021, 10:24 AM

## 2021-09-06 ENCOUNTER — Telehealth: Payer: Self-pay | Admitting: Cardiology

## 2021-09-06 DIAGNOSIS — I4891 Unspecified atrial fibrillation: Secondary | ICD-10-CM | POA: Diagnosis not present

## 2021-09-06 DIAGNOSIS — I340 Nonrheumatic mitral (valve) insufficiency: Secondary | ICD-10-CM | POA: Diagnosis not present

## 2021-09-06 DIAGNOSIS — E871 Hypo-osmolality and hyponatremia: Secondary | ICD-10-CM | POA: Diagnosis not present

## 2021-09-06 DIAGNOSIS — I5033 Acute on chronic diastolic (congestive) heart failure: Secondary | ICD-10-CM

## 2021-09-06 DIAGNOSIS — R7989 Other specified abnormal findings of blood chemistry: Secondary | ICD-10-CM | POA: Diagnosis not present

## 2021-09-06 LAB — COMPREHENSIVE METABOLIC PANEL
ALT: 155 U/L — ABNORMAL HIGH (ref 0–44)
AST: 110 U/L — ABNORMAL HIGH (ref 15–41)
Albumin: 3.2 g/dL — ABNORMAL LOW (ref 3.5–5.0)
Alkaline Phosphatase: 670 U/L — ABNORMAL HIGH (ref 38–126)
Anion gap: 9 (ref 5–15)
BUN: 24 mg/dL — ABNORMAL HIGH (ref 8–23)
CO2: 29 mmol/L (ref 22–32)
Calcium: 9.3 mg/dL (ref 8.9–10.3)
Chloride: 93 mmol/L — ABNORMAL LOW (ref 98–111)
Creatinine, Ser: 0.8 mg/dL (ref 0.44–1.00)
GFR, Estimated: >60 mL/min (ref >60)
Glucose, Bld: 108 mg/dL — ABNORMAL HIGH (ref 70–99)
Potassium: 4.2 mmol/L (ref 3.5–5.1)
Sodium: 131 mmol/L — ABNORMAL LOW (ref 135–145)
Total Bilirubin: 1.3 mg/dL — ABNORMAL HIGH (ref 0.3–1.2)
Total Protein: 6.1 g/dL — ABNORMAL LOW (ref 6.5–8.1)

## 2021-09-06 LAB — CBC
HCT: 39 % (ref 36.0–46.0)
Hemoglobin: 13.4 g/dL (ref 12.0–15.0)
MCH: 30.1 pg (ref 26.0–34.0)
MCHC: 34.4 g/dL (ref 30.0–36.0)
MCV: 87.6 fL (ref 80.0–100.0)
Platelets: 270 10*3/uL (ref 150–400)
RBC: 4.45 MIL/uL (ref 3.87–5.11)
RDW: 16.1 % — ABNORMAL HIGH (ref 11.5–15.5)
WBC: 9.9 10*3/uL (ref 4.0–10.5)
nRBC: 0 % (ref 0.0–0.2)

## 2021-09-06 LAB — T3, FREE: T3, Free: 2 pg/mL (ref 2.0–4.4)

## 2021-09-06 MED ORDER — ENSURE ENLIVE PO LIQD: 237.0000 mL | Freq: Three times a day (TID) | ORAL | Status: AC

## 2021-09-06 MED ORDER — FUROSEMIDE 40 MG PO TABS: 40.0000 mg | ORAL_TABLET | Freq: Every day | ORAL | 1 refills | Status: DC

## 2021-09-06 MED ORDER — FUROSEMIDE 40 MG PO TABS: 40.0000 mg | ORAL_TABLET | Freq: Every day | ORAL | Status: DC

## 2021-09-06 MED ORDER — ACETAMINOPHEN 325 MG PO TABS: 325.0000 mg | ORAL_TABLET | Freq: Every day | ORAL | Status: DC

## 2021-09-06 MED ORDER — METOPROLOL SUCCINATE ER 100 MG PO TB24: 150.0000 mg | ORAL_TABLET | Freq: Every morning | ORAL | 1 refills | Status: DC

## 2021-09-06 MED ORDER — EPINEPHRINE 0.3 MG/0.3ML IJ SOAJ
0.3000 mg | Freq: Once | INTRAMUSCULAR | Status: DC | PRN
Start: 2021-09-06 — End: 2022-05-14

## 2021-09-06 NOTE — TOC Initial Note (Addendum)
Transition of Care [facility]) - Initial/Assessment Note    Patient Details  Name: Emily Robertson MRN: [id number] Date of Birth: Apr 20, 1930  Transition of Care [facility] CM/SW Contact:    Lanier Clam, RN Phone Number: 09/06/2021, 11:41 AM  Clinical Narrative: Active w/[facility]-HHPT/OT/CSW-await HHC orders will add aide.                  Expected Discharge Plan: Home w Home Health Services Barriers to Discharge: Continued Medical Work up   Patient Goals and CMS Choice Patient states their goals for this hospitalization and ongoing recovery are:: Home CMS Medicare.gov Compare Post Acute Care list provided to:: Patient Choice offered to / list presented to : Patient  Expected Discharge Plan and Services Expected Discharge Plan: Home w Home Health Services   Discharge Planning Services: CM Consult Post Acute Care Choice: Home Health Living arrangements for the past 2 months: Single Family Home                                      Prior Living Arrangements/Services Living arrangements for the past 2 months: Single Family Home Lives with:: Self Patient language and need for interpreter reviewed:: Yes Do you feel safe going back to the place where you live?: Yes      Need for Family Participation in Patient Care: Yes (Comment) Care giver support system in place?: Yes (comment) Current home services: DME, Home PT, Home OT, Other (comment) (rw,Active w/[facility]-PT/OT/CSW) Criminal Activity/Legal Involvement Pertinent to Current Situation/Hospitalization: No - Comment as needed  Activities of Daily Living Home Assistive Devices/Equipment: Eyeglasses, Environmental consultant (specify type), Raised toilet seat with rails, Shower chair with back ADL Screening (condition at time of admission) Patient's cognitive ability adequate to safely complete daily activities?: Yes Is the patient deaf or have difficulty hearing?: No Does the patient have difficulty seeing, even when wearing  glasses/contacts?: No Does the patient have difficulty concentrating, remembering, or making decisions?: No Patient able to express need for assistance with ADLs?: Yes Does the patient have difficulty dressing or bathing?: No Independently performs ADLs?: No Communication: Independent Dressing (OT): Independent Grooming: Independent Feeding: Independent Bathing: Needs assistance Is this a change from baseline?: Pre-admission baseline Toileting: Needs assistance Is this a change from baseline?: Pre-admission baseline In/Out Bed: Needs assistance Is this a change from baseline?: Pre-admission baseline Walks in Home: Needs assistance Is this a change from baseline?: Pre-admission baseline Does the patient have difficulty walking or climbing stairs?: Yes Weakness of Legs: Both Weakness of Arms/Hands: Both  Permission Sought/Granted Permission sought to share information with : Case Manager Permission granted to share information with : Yes, Verbal Permission Granted  Share Information with NAME: Case Manager           Emotional Assessment Appearance:: Appears stated age Attitude/Demeanor/Rapport: Gracious Affect (typically observed): Accepting Orientation: : Oriented to Self, Oriented to Place, Oriented to  Time, Oriented to Situation Alcohol / Substance Use: Not Applicable Psych Involvement: No (comment)  Admission diagnosis:  Acute CHF (congestive heart failure) (HCC) [I50.9] Atrial fibrillation with RVR (HCC) [I48.91] Patient Active Problem List   Diagnosis Date Noted   Protein-calorie malnutrition, severe 09/05/2021   Acute on chronic diastolic CHF (congestive heart failure) (HCC) 09/05/2021   Nonrheumatic mitral valve regurgitation    Acute CHF (congestive heart failure) (HCC) 09/03/2021   Dyspnea 09/03/2021   Hyponatremia 09/03/2021   Elevated LFTs 09/03/2021   Leukocytosis 09/03/2021  Glaucoma 09/03/2021   Lung nodule 09/03/2021   Pleural effusion 09/03/2021    Atrial fibrillation with RVR (HCC)    Acute diastolic heart failure (HCC)    Osteoporosis 07/25/2021   Rheumatoid arthritis (HCC) 07/25/2021   Pure hypercholesterolemia 07/25/2021   Paroxysmal atrial fibrillation (HCC) 07/25/2021   Fall at home, initial encounter 07/25/2021   Closed pelvic fracture (HCC) 07/25/2021   Hyperthyroidism 08/03/2013   Angioedema 11/29/2012   HTN (hypertension) 11/29/2012   PCP:  Rogers Blocker, FNP Pharmacy:   [facility] [id number], [place] - [address] [place] [address] Phone: [phone] Fax: [phone]  [facility] [address] [place] [address] Phone: [phone] Fax: [phone]     Social Determinants of Health (SDOH) Interventions    Readmission Risk Interventions     No data to display

## 2021-09-06 NOTE — Discharge Summary (Signed)
Physician Discharge Summary  Emily Robertson [id number] DOB: April 04, 1930  PCP: [name], FNP  Admitted from: Home Discharged to: Home  Admit date: 09/03/2021 Discharge date: 09/06/2021  Recommendations for Outpatient Follow-up:    Follow-up Information     Early Osmond, MD. Go on 09/18/2021.   Specialty: Cardiology Why: @ 3:40pm to talk about your leaky valve Contact information: [place] [address] [phone]         Almyra Deforest, [place] Follow up on 09/21/2021.   Specialties: Cardiology, Radiology Why: 09/21/2021 @2 :20PM. Cardiology follow up.  To be seen with repeat labs (CBC, BMP, LFTs).  Decision to be made regarding restarting statins based on lab review. Contact information: [address] [place] [address] [phone]         [name], FNP. Schedule an appointment as soon as possible for a visit in 1 week(s).   Specialty: Family Medicine Why: To be seen with repeat labs (CBC & CMP).  Consider outpatient endocrinology consultation for management of hyperthyroidism. Contact information: [place] [address] [phone]         Lelon Perla, MD .   Specialty: Cardiology Contact information: [address] [place] [address] [phone]                  Home Health: Home Health Orders (From admission, onward)     Start     Ordered   09/06/21 [place]  At discharge       Question Answer Comment  To provide the following care/treatments PT   To provide the following care/treatments OT   To provide the following care/treatments [place]      09/06/21 1507             Equipment/Devices: None.  Patient reportedly has all DME's per OT.    Discharge Condition: Improved and stable.   Code Status: Full Code Diet recommendation:  Discharge Diet Orders (From admission, onward)     Start     Ordered   09/06/21 0000  Diet - low  sodium heart healthy       Comments: Diet consistency as per SLP recommendations as follows:  Diet recommendations: Regular;Dysphagia 3 (mechanical soft);Thin liquid Liquids provided via: Cup;Straw Medication Administration: Whole meds with liquid Supervision: Patient able to self feed Compensations: Slow rate;Small sips/bites Postural Changes and/or Swallow Maneuvers: Seated upright 90 degrees;Upright 30-60 min after meal   09/06/21 1521             Discharge Diagnoses:  Principal Problem:   Acute on chronic diastolic CHF (congestive heart failure) (HCC) Active Problems:   HTN (hypertension)   Hyperthyroidism   Osteoporosis   Rheumatoid arthritis (HCC)   Paroxysmal atrial fibrillation (HCC)   Dyspnea   Hyponatremia   Elevated LFTs   Leukocytosis   Glaucoma   Lung nodule   Pleural effusion   Protein-calorie malnutrition, severe   Nonrheumatic mitral valve regurgitation   Brief Narrative:  HPI: Emily Robertson is a 88 y.o. female with medical history significant of a fib, HLD, glaucoma, OA, RA, hyperthyroidism presented with dyspnea x2 days, worse at night, associated with dry cough and nausea, decreased appetite.  Admitted for persistent A-fib, acute on chronic diastolic CHF, moderate to severe mitral insufficiency.  Cardiology consulted, s/p TEE, managed with IV diuretics.  Improved.   Assessment & Plan:   Acute on chronic congestive heart  failure with preserved ejection fraction/moderate to severe MR: BNP 1182.  Recent echo showed 60 to 65% ejection fraction.  Chest x-ray this time with bilateral pleural effusion and groundglass nodular opacity in the right lung apex, cannot exclude adenocarcinoma.  Cardiology consulted.  Limited echo 7/25: LVEF 50-55%, moderate concentric left ventricular hypertrophy, moderate to severe mitral valve regurgitation.  Findings reported as no significant change from prior study.  S/p TEE 7/26 and Cardiology are considering mitral clip, as  outpatient.  Patient was treated with several days of IV Lasix.  Clinically euvolemic.  Patient seen along with Dr. Oval Linsey.  Recommends discharging home on oral Lasix 40 mg daily.  Patient has also been counseled to weigh herself each day and take an additional dose if her weight goes up by 2 pounds in a day or 5 pounds over the course of a week.  Cardiology has arranged outpatient follow-up.   Persistent atrial fibrillation with RVR: Patient was recently diagnosed with A-fib during her recent hospitalization last month.  She is on Eliquis as well.  Her heart rate appears to be controlled at current regimen of diltiazem and metoprolol XL dose was increased this admission from 100 Mg daily to 150 Mg daily.  A-fib RVR likely driven by hyperthyroidism and severe MR.  Cardiology following.   Moderate to severe mitral regurgitation: Limited echo this admission suggested that.  Underwent TEE 7/26 which confirms highly eccentric moderate to severe mitral insufficiency, normal LV systolic function and Cardiology recommends mitral clip and have arranged follow-up for same.   Hyperthyroidism: Confirmed in June 2023 during recent hospitalization due to pelvic fracture.  On methimazole.  On 07/26/2021, TSH 0.010, free T3: 5.8, free T4 :3.62, thyroid peroxidase antibody: Elevated at 287 and thyroglobulin antibody negative.  TSH now normal.  Free T4 mildly elevated above the normal range (1.49), free T3 is at lower limit of normal/2.  S/p radioactive iodine several years ago.  As discussed with Dr. Oval Linsey, since TSH is normal and free T4 is only mildly out of normal range, would continue current dose of methimazole.  Recommend outpatient consultation with endocrinology.   Elevated LFTs: Ultrasound abdomen unremarkable.  Viral hepatitis panel negative as well.  LFTs in June 2023 were normal.  This admission, LFTs abnormal, mildly but stable.  As reviewed with Dr. Oval Linsey and patient, this is new since starting  atorvastatin last month.  Discontinued atorvastatin for now until close outpatient follow-up with repeat LFTs and at that time determination can be made if this can be safely resumed.   Leukocytosis: Nonspecific.  No signs of infection.  Resolved.  Acute hyponatremia: Work-up indicates SIADH, could be CHF likely, or due to lung nodule/pathology.  Serum sodium stable in the low 130s.   Hyperlipidemia: Statins discontinued due to elevated LFTs.  See discussion above.   Rheumatoid arthritis: Continue home regimen including low-dose prednisone and Arava.   RUL pulmonary nodule: Pulmonology consulted and they are detailed consult note from 7/25 appreciated.  They plan to evaluate this as outpatient with a PET scan and research study.  Pulmonology appears to have arranged follow-up with Dr. Lamonte Sakai on 8/29.   Estimated body mass index is 18.47 kg/m as calculated from the following:   Height as of this encounter: 5\' 4"  (1.626 m).   Weight as of this encounter: 48.8 kg.   Nutritional Assessment: Body mass index is 18.47 kg/m.Marland Kitchen Seen by dietician.  I agree with the assessment and plan as outlined below: Nutrition Status: Nutrition Problem: Severe  Malnutrition Etiology: chronic illness Signs/Symptoms: severe fat depletion, severe muscle depletion Interventions: Ensure Enlive (each supplement provides 350kcal and 20 grams of protein), MVI, Refer to RD note for recommendations     Consultants:  Cardiology Pulmonology   Procedures:  TEE 7/26   Discharge Instructions  Discharge Instructions     (HEART FAILURE PATIENTS) Call MD:  Anytime you have any of the following symptoms: 1) 3 pound weight gain in 24 hours or 5 pounds in 1 week 2) shortness of breath, with or without a dry hacking cough 3) swelling in the hands, feet or stomach 4) if you have to sleep on extra pillows at night in order to breathe.   Complete by: As directed    Call MD for:  difficulty breathing, headache or visual  disturbances   Complete by: As directed    Call MD for:  extreme fatigue   Complete by: As directed    Call MD for:  persistant dizziness or light-headedness   Complete by: As directed    Call MD for:  persistant nausea and vomiting   Complete by: As directed    Call MD for:  severe uncontrolled pain   Complete by: As directed    Call MD for:  temperature >100.4   Complete by: As directed    Diet - low sodium heart healthy   Complete by: As directed    Diet consistency as per SLP recommendations as follows:  Diet recommendations: Regular;Dysphagia 3 (mechanical soft);Thin liquid Liquids provided via: Cup;Straw Medication Administration: Whole meds with liquid Supervision: Patient able to self feed Compensations: Slow rate;Small sips/bites Postural Changes and/or Swallow Maneuvers: Seated upright 90 degrees;Upright 30-60 min after meal   Increase activity slowly   Complete by: As directed         Medication List     STOP taking these medications    atorvastatin 20 MG tablet Commonly known as: LIPITOR   omega-3 acid ethyl esters 1 g capsule Commonly known as: LOVAZA   traMADol 50 MG tablet Commonly known as: ULTRAM       TAKE these medications    acetaminophen 325 MG tablet Commonly known as: TYLENOL Take 1 tablet (325 mg total) by mouth daily with supper.   alendronate 70 MG tablet Commonly known as: FOSAMAX Take 70 mg by mouth every Wednesday.   apixaban 2.5 MG Tabs tablet Commonly known as: ELIQUIS Take 1 tablet (2.5 mg total) by mouth 2 (two) times daily.   CALCIUM PO Take 1 tablet by mouth daily with supper.   cetirizine 10 MG tablet Commonly known as: ZYRTEC Take 1 tablet (10 mg total) by mouth 2 (two) times daily.   diltiazem 180 MG 24 hr capsule Commonly known as: CARDIZEM CD Take 1 capsule (180 mg total) by mouth daily.   EPINEPHrine 0.3 mg/0.3 mL Soaj injection Commonly known as: EpiPen Inject 0.3 mg into the muscle once as needed for  anaphylaxis.   famotidine 20 MG tablet Commonly known as: PEPCID Take 1 tablet (20 mg total) by mouth 2 (two) times daily.   feeding supplement Liqd Take 237 mLs by mouth 3 (three) times daily between meals for 10 days.   Fish Oil 1000 MG Caps Take 1,000 mg by mouth every morning.   furosemide 40 MG tablet Commonly known as: LASIX Take 1 tablet (40 mg total) by mouth daily. Start taking on: September 07, 2021   latanoprost 0.005 % ophthalmic solution Commonly known as: XALATAN Place 1 drop into the  left eye at bedtime.   leflunomide 10 MG tablet Commonly known as: ARAVA Take 10 mg by mouth every morning.   methimazole 10 MG tablet Commonly known as: TAPAZOLE Take 1 tablet (10 mg total) by mouth 2 (two) times daily.   metoprolol succinate 100 MG 24 hr tablet Commonly known as: TOPROL-XL Take 1.5 tablets (150 mg total) by mouth every morning. Start taking on: September 07, 2021 What changed: how much to take   montelukast 10 MG tablet Commonly known as: SINGULAIR Take 1 tablet (10 mg total) by mouth daily. What changed: when to take this   multivitamin with minerals Tabs tablet Take 1 tablet by mouth every morning.   OSTEO BI-FLEX JOINT SHIELD PO Take 1 capsule by mouth daily with supper.   predniSONE 2.5 MG tablet Commonly known as: DELTASONE Take 1 tablet (2.5 mg total) by mouth daily with breakfast.   PROBIOTIC DAILY PO Take 1 capsule by mouth daily with supper.   timolol 0.5 % ophthalmic solution Commonly known as: TIMOPTIC Place 1 drop into both eyes 2 (two) times daily.   VITAMIN C PO Take 1 tablet by mouth every morning.   VITAMIN D3 PO Take 1 capsule by mouth daily with supper.       Allergies  Allergen Reactions   Cozaar [Losartan] Swelling    Tongue swelling   Erythromycin Itching   Azopt [Brinzolamide] Rash    Rash on eyelid      Procedures/Studies: ECHO TEE  Result Date: 09/05/2021    TRANSESOPHOGEAL ECHO REPORT   Patient Name:   VIONE EVERTON Date of Exam: 09/05/2021 Medical Rec #:  [id number]         Height:       64.0 in Accession #:    [id number]        Weight:       107.6 lb Date of Birth:  [birth year]         BSA:          1.503 m Patient Age:    58 years          BP:           143/85 mmHg Patient Gender: F                 HR:           98 bpm. Exam Location:  Inpatient Procedure: Transesophageal Echo, 3D Echo, Cardiac Doppler and Color Doppler Indications:     I34.0 Nonrheumatic mitral (valve) insufficiency  History:         Patient has prior history of Echocardiogram examinations. CHF,                  Abnormal ECG, Mitral Valve Disease, Arrythmias:Atrial                  Fibrillation, Signs/Symptoms:Dyspnea and Shortness of Breath;                  Risk Factors:Sleep Apnea.  Sonographer:     Roseanna Rainbow RDCS Referring Phys:  [id number] [place] [facility] Diagnosing Phys: Sanda Klein MD PROCEDURE: After discussion of the risks and benefits of a TEE, an informed consent was obtained from the patient. The transesophogeal probe was passed without difficulty through the esophogus of the patient. Imaged were obtained with the patient in a left lateral decubitus position. Sedation performed by different physician. The patient was monitored while under deep sedation. Anesthestetic sedation was provided intravenously  by Anesthesiology: 171mg  of Propofol. The patient's vital signs; including heart rate, blood pressure, and oxygen saturation; remained stable throughout the procedure. The patient developed no complications during the procedure. IMPRESSIONS  1. Left ventricular ejection fraction, by estimation, is 65 to 70%. The left ventricle has normal function. The left ventricle has no regional wall motion abnormalities. Left ventricular diastolic function could not be evaluated.  2. Right ventricular systolic function is normal. The right ventricular size is normal.  3. Left atrial size was severely dilated. No left atrial/left atrial appendage  thrombus was detected. The LAA emptying velocity was 27 cm/s.  4. Right atrial size was mildly dilated.  5. A single ruptured chorda tendina is seen, attached to the mid portion of P2 (middle) scallop of the posterior leaflet. There is a fairly short arc of the P2 scallop with flail motion. Flail gap 5 mm, flail width 9 mm. Posterior leaflet length 15 mm, valve area 6 cm sq, mean gradient 2 mm Hg at 111 bpm. MR 2D vena contracta is 5 mm. 3D guided effective regurgitant orifice area measures 0.34 cm sq, regurgitant volume 45 mL. There is blunted, but antegrade systolic flow in the right and left pulmonary veins. Due to atrial fibrillation, unable to accurately assess via PISA method. The mitral valve is myxomatous. Moderate to severe mitral valve regurgitation. No evidence of mitral stenosis.  6. The tricuspid valve is myxomatous.  7. The aortic valve is tricuspid. Aortic valve regurgitation is mild.  8. There is mild (Grade II) plaque involving the ascending aorta. FINDINGS  Left Ventricle: Left ventricular ejection fraction, by estimation, is 65 to 70%. The left ventricle has normal function. The left ventricle has no regional wall motion abnormalities. The left ventricular internal cavity size was normal in size. There is  no left ventricular hypertrophy. Left ventricular diastolic function could not be evaluated due to atrial fibrillation. Left ventricular diastolic function could not be evaluated. Right Ventricle: The right ventricular size is normal. No increase in right ventricular wall thickness. Right ventricular systolic function is normal. Left Atrium: Left atrial size was severely dilated. No left atrial/left atrial appendage thrombus was detected. The LAA emptying velocity was 27 cm/s. Right Atrium: Right atrial size was mildly dilated. Pericardium: There is no evidence of pericardial effusion. Mitral Valve: A single ruptured chorda tendina is seen, attached to the mid portion of P2 (middle) scallop of  the posterior leaflet. There is a fairly short arc of the P2 scallop with flail motion. Flail gap 5 mm, flail width 9 mm. Posterior leaflet length 15 mm, valve area 6 cm sq, mean gradient 2 mm Hg at 111 bpm. MR 2D vena contracta is 5 mm. 3D guided effective regurgitant orifice area measures 0.34 cm sq, regurgitant volume 45 mL. There is blunted, but antegrade systolic flow in the right and left pulmonary veins. Due to atrial fibrillation, unable to accurately assess via PISA method. The mitral valve is myxomatous. Moderate to severe mitral valve regurgitation, with eccentric anteriorly directed jet. No evidence of mitral valve stenosis. MV  peak gradient, 6.7 mmHg. The mean mitral valve gradient is 2.0 mmHg with average heart rate of 111 bpm. Tricuspid Valve: The tricuspid valve is myxomatous. Tricuspid valve regurgitation is mild. Aortic Valve: The aortic valve is tricuspid. Aortic valve regurgitation is mild. Pulmonic Valve: The pulmonic valve was normal in structure. Pulmonic valve regurgitation is mild. Aorta: The aortic root, ascending aorta and aortic arch are all structurally normal, with no evidence of dilitation or  obstruction. There is mild (Grade II) plaque involving the ascending aorta. IAS/Shunts: No atrial level shunt detected by color flow Doppler.  MITRAL VALVE MV Peak grad: 6.7 mmHg MV Mean grad: 2.0 mmHg MV Vmax:      1.29 m/s MV Vmean:     68.4 cm/s MR Peak grad:    78.1 mmHg MR Mean grad:    50.0 mmHg MR Vmax:         442.00 cm/s MR Vmean:        335.0 cm/s MR PISA:         1.57 cm MR PISA Eff ROA: 13 mm MR PISA Radius:  0.50 cm Mihai Croitoru MD Electronically signed by Sanda Klein MD Signature Date/Time: 09/05/2021/11:40:18 AM    Final    ECHOCARDIOGRAM LIMITED  Result Date: 09/04/2021    ECHOCARDIOGRAM LIMITED REPORT   Patient Name:   TALEEA DINATALE Date of Exam: 09/04/2021 Medical Rec #:  [id number]         Height:       64.0 in Accession #:    [id number]        Weight:       106.0  lb Date of Birth:  [birth year]         BSA:          1.494 m Patient Age:    40 years          BP:           114/82 mmHg Patient Gender: F                 HR:           84 bpm. Exam Location:  Inpatient Procedure: 3D Echo, Limited Echo, Cardiac Doppler and Color Doppler Indications:    I50.40* Unspecified combined systolic (congestive) and diastolic                 (congestive) heart failure; I34.0 Nonrheumatic mitral (valve)                 insufficiency  History:        Patient has prior history of Echocardiogram examinations, most                 recent 07/26/2021. CHF, Abnormal ECG, Mitral Valve Disease,                 Arrythmias:Atrial Fibrillation, Signs/Symptoms:Shortness of                 Breath and Dyspnea; Risk Factors:Dyslipidemia and Hypertension.  Sonographer:    Roseanna Rainbow RDCS Referring Phys: [id number] [place]  1. Left ventricular ejection fraction, by estimation, is 50 to 55%. The left ventricle has low normal function. There is moderate concentric left ventricular hypertrophy.  2. Left atrial size was severely dilated.  3. Right atrial size was severely dilated.  4. Eccentric anteriorly directed jet. Posterior leaflet appears prolapsed. The mitral valve is degenerative. Moderate to severe mitral valve regurgitation.  5. Tricuspid valve regurgitation is mild to moderate.  6. Aortic valve regurgitation is trivial.  7. There is moderately elevated pulmonary artery systolic pressure. Comparison(s): No significant change from prior study. Conclusion(s)/Recommendation(s): Recommend TEE for better assessment of MR etiology and severity. FINDINGS  Left Ventricle: Left ventricular ejection fraction, by estimation, is 50 to 55%. The left ventricle has low normal function. There is moderate concentric left ventricular hypertrophy. Right Ventricle: There is moderately elevated pulmonary artery systolic pressure. The  tricuspid regurgitant velocity is 2.78 m/s, and with an assumed right atrial  pressure of 15 mmHg, the estimated right ventricular systolic pressure is 45.9 mmHg. Left Atrium: Left atrial size was severely dilated. Right Atrium: Right atrial size was severely dilated. Mitral Valve: Eccentric anteriorly directed jet. Posterior leaflet appears prolapsed. The mitral valve is degenerative in appearance. Moderate to severe mitral valve regurgitation. Tricuspid Valve: Tricuspid valve regurgitation is mild to moderate. Aortic Valve: Aortic valve regurgitation is trivial. LEFT VENTRICLE PLAX 2D LVIDd:         3.70 cm LVIDs:         2.80 cm LV PW:         1.40 cm LV IVS:        1.40 cm                            3D Volume EF:                            3D EF:        52 % LV Volumes (MOD)           LV EDV:       87 ml LV vol d, MOD A2C: 74.6 ml LV ESV:       42 ml LV vol d, MOD A4C: 60.5 ml LV SV:        46 ml LV vol s, MOD A2C: 38.8 ml LV vol s, MOD A4C: 32.1 ml LV SV MOD A2C:     35.8 ml LV SV MOD A4C:     60.5 ml LV SV MOD BP:      31.8 ml IVC IVC diam: 2.00 cm LEFT ATRIUM              Index LA diam:        3.95 cm  2.64 cm/m LA Vol (A2C):   98.2 ml  65.73 ml/m LA Vol (A4C):   118.0 ml 78.98 ml/m LA Biplane Vol: 110.0 ml 73.63 ml/m   AORTA Ao Root diam: 3.20 cm MR Peak grad:    81.5 mmHg    TRICUSPID VALVE MR Mean grad:    48.0 mmHg    TR Peak grad:   30.9 mmHg MR Vmax:         451.50 cm/s  TR Vmax:        278.00 cm/s MR Vmean:        315.0 cm/s MR PISA:         3.08 cm MR PISA Eff ROA: 26 mm MR PISA Radius:  0.70 cm Carolan Clines Electronically signed by Carolan Clines Signature Date/Time: 09/04/2021/12:20:21 PM    Final    US Abdomen Limited RUQ (LIVER/GB)  Result Date: 09/03/2021 CLINICAL DATA:  Elevated liver function studies EXAM: ULTRASOUND ABDOMEN LIMITED RIGHT UPPER QUADRANT COMPARISON:  None Available. FINDINGS: Gallbladder: No gallstones or wall thickening visualized. No sonographic Murphy sign noted by sonographer. Common bile duct: Diameter: 3 mm, normal Liver: No focal lesion  identified. Within normal limits in parenchymal echogenicity. Portal vein is patent on color Doppler imaging with normal direction of blood flow towards the liver. Other: A small right pleural effusion is identified. IMPRESSION: 1. No evidence of cholelithiasis or acute cholecystitis. 2. Right pleural effusion. Electronically Signed   By: Burman Nieves M.D.   On: 09/03/2021 17:49   CT Chest Wo Contrast  Result Date:  09/03/2021 CLINICAL DATA:  Weakness, fatigue EXAM: CT CHEST WITHOUT CONTRAST TECHNIQUE: Multidetector CT imaging of the chest was performed following the standard protocol without IV contrast. RADIATION DOSE REDUCTION: This exam was performed according to the departmental dose-optimization program which includes automated exposure control, adjustment of the mA and/or kV according to patient size and/or use of iterative reconstruction technique. COMPARISON:  CT 01/12/2021 FINDINGS: Cardiovascular: Heart size is mildly enlarged. No pericardial effusion. Thoracic aorta is nonaneurysmal. Atherosclerotic calcifications of the aorta and coronary arteries. Mediastinum/Nodes: No axillary lymphadenopathy. Redemonstration mildly enlarged mediastinal lymph nodes including 10 mm right paratracheal node (series 2, image 52) and 15 mm precarinal node (series 2, image 61). Evaluation of the hilar structures is limited in the absence of intravenous contrast. Within this limitation, no obvious hilar adenopathy or mass is identified. Thyroid gland, trachea, and esophagus demonstrate no significant findings. Lungs/Pleura: Small-moderate-sized layering bilateral pleural effusions with associated compressive atelectasis. Extensive biapical pleuroparenchymal scarring. Ground-glass nodular density at the right lung apex measures approximately 2.1 x 1.8 cm (series 7, image 32), previously approximately 1.4 x 1.0 cm. Upper Abdomen: No acute abnormality. Musculoskeletal: Multilevel spondylosis of the thoracic spine and  imaged lower cervical spine. Bones appear demineralized. No acute bony findings. No chest wall abnormality. IMPRESSION: 1. Small-moderate-sized layering bilateral pleural effusions with associated compressive atelectasis. 2. Ground-glass nodular density at the right lung apex measuring approximately 2.1 x 1.8 cm, previously measured 1.4 x 1.0 cm. Adenocarcinoma cannot be excluded. PET-CT and/or tissue sampling should be considered with patient's age and functional status taken into consideration. 3. Redemonstration of mildly enlarged mediastinal lymph nodes, which may be reactive. 4. Aortic and coronary artery atherosclerosis. Aortic Atherosclerosis (ICD10-I70.0). Electronically Signed   By: Duanne Guess D.O.   On: 09/03/2021 13:15   DG Chest Port 1 View  Result Date: 09/03/2021 CLINICAL DATA:  Shortness of breath. EXAM: PORTABLE CHEST 1 VIEW COMPARISON:  December 19, 2020 FINDINGS: Chronic nodular opacities at the right lung apex, not well assessed on this study. Bibasilar opacities. No visible pleural effusions or pneumothorax. Cardiomediastinal silhouette is unchanged. Polyarticular degenerative change in osteopenia. IMPRESSION: 1. Bibasilar opacities, which could represent atelectasis and/or pneumonia. 2. Chronic nodular opacities at the right lung apex, not well assessed on this study. Please see prior 01/12/2021 CT chest for follow-up recommendations. Electronically Signed   By: Feliberto Harts M.D.   On: 09/03/2021 09:44      Subjective: Patient denies complaints.  Denies dyspnea, chest pain or palpitations.  Patient interviewed and examined along with cardiologist.  Discharge Exam:  Vitals:   09/06/21 0500 09/06/21 0538 09/06/21 0947 09/06/21 1333  BP:  139/85 (!) 123/58 137/86  Pulse:  85  90  Resp:  20  16  Temp:  97.8 F (36.6 C)  (!) 97.5 F (36.4 C)  TempSrc:  Oral  Oral  SpO2:  94%  99%  Weight: 48.7 kg     Height:        General exam: Elderly female, small built and  frail, sitting up comfortably in reclining chair. Respiratory system: Slightly diminished breath sounds in the bases but otherwise clear to auscultation without wheezing, rhonchi or crackles. Cardiovascular system: S1 and S2 heard, irregularly irregular.?  JVD.  No pedal edema.  3/6 systolic murmur best heard at apex.  Telemetry personally reviewed: A-fib with controlled ventricular rate. Gastrointestinal system: Abdomen is nondistended, soft and nontender. No organomegaly or masses felt. Normal bowel sounds heard. Central nervous system: Alert and oriented. No focal neurological  deficits. Extremities: Symmetric 5 x 5 power. Skin: No rashes, lesions or ulcers Psychiatry: Judgement and insight appear normal. Mood & affect appropriate.    The results of significant diagnostics from this hospitalization (including imaging, microbiology, ancillary and laboratory) are listed below for reference.     Microbiology: No results found for this or any previous visit (from the past 240 hour(s)).   Labs: CBC: Recent Labs  Lab 09/03/21 1100 09/04/21 0018 09/06/21 0512  WBC 12.5* 11.9* 9.9  NEUTROABS 10.9*  --   --   HGB 12.1 12.0 13.4  HCT 36.2 36.2 39.0  MCV 88.3 90.3 87.6  PLT 266 236 [id number]    Basic Metabolic Panel: Recent Labs  Lab 09/03/21 2111 09/04/21 0018 09/04/21 0430 09/05/21 0453 09/06/21 0512  NA 131* 131* 135 133* 131*  K 4.0 4.2 3.6 3.6 4.2  CL 92* 95* 96* 95* 93*  CO2 28 23 28 30 29   GLUCOSE 169* 119* 105* 102* 108*  BUN 19 17 17 23  24*  CREATININE 0.93 0.76 0.87 0.86 0.80  CALCIUM 9.1 8.6* 8.9 9.1 9.3  PHOS 3.2 3.3 3.2  --   --     Liver Function Tests: Recent Labs  Lab 09/03/21 1100 09/03/21 2111 09/04/21 0018 09/04/21 0430 09/06/21 0512  AST 55*  --   --   --  110*  ALT 159*  --   --   --  155*  ALKPHOS 456*  --   --   --  670*  BILITOT 1.5* 1.2  --   --  1.3*  PROT 6.2*  --   --   --  6.1*  ALBUMIN 3.4* 3.9 3.2* 3.1* 3.2*     Hgb A1c Recent Labs     09/03/21 2111  HGBA1C 5.7*     Thyroid function studies Recent Labs    09/05/21 1319  TSH 1.062  T3FREE 2.0   Urinalysis    Component Value Date/Time   COLORURINE AMBER (A) 09/03/2021 1345   APPEARANCEUR CLEAR 09/03/2021 1345   LABSPEC 1.023 09/03/2021 1345   PHURINE 5.0 09/03/2021 1345   GLUCOSEU NEGATIVE 09/03/2021 1345   HGBUR NEGATIVE 09/03/2021 1345   BILIRUBINUR NEGATIVE 09/03/2021 1345   KETONESUR 5 (A) 09/03/2021 1345   PROTEINUR 100 (A) 09/03/2021 1345   NITRITE NEGATIVE 09/03/2021 1345   LEUKOCYTESUR NEGATIVE 09/03/2021 1345    I discussed in detail with patient's son via phone, updated care and answered all questions.  Time coordinating discharge: 35 minutes  SIGNED:  Vernell Leep, MD,  FACP, [facility], [facility], [facility] (Care Management Physician Certified). [facility]  To contact the attending provider between 7A-7P or the covering provider during after hours 7P-7A, please log into the web site [email or web address] and access using universal [place] password for that web site. If you do not have the password, please call the hospital operator.

## 2021-09-06 NOTE — TOC Transition Note (Signed)
Transition of Care [facility]) - CM/SW Discharge Note   Patient Details  Name: Emily Robertson MRN: [id number] Date of Birth: Oct 19, 1930  [facility] CM/SW Contact:  Lanier Clam, RN Phone Number: 09/06/2021, 3:39 PM   Clinical Narrative:   d/c home w/[facility]-HHPT/OT/aide-Jennifer aware. No further CM needs.    Final next level of care: Home w Home Health Services Barriers to Discharge: No Barriers Identified   Patient Goals and CMS Choice Patient states their goals for this hospitalization and ongoing recovery are:: Home CMS Medicare.gov Compare Post Acute Care list provided to:: Patient Choice offered to / list presented to : Patient  Discharge Placement                       Discharge Plan and Services   Discharge Planning Services: CM Consult Post Acute Care Choice: Home Health                    HH Arranged: OT, PT, Social Work, Nurse's Aide HH Agency: [facility] Date HH Agency Contacted: 09/06/21 Time HH Agency Contacted: [phone] Representative spoke with at [facility] Agency: Victorino Dike  Social Determinants of Health (SDOH) Interventions     Readmission Risk Interventions     No data to display

## 2021-09-06 NOTE — Plan of Care (Signed)
  Problem: Activity: Goal: Risk for activity intolerance will decrease Outcome: Adequate for Discharge   Problem: Nutrition: Goal: Adequate nutrition will be maintained Outcome: Adequate for Discharge   Problem: Clinical Measurements: Goal: Respiratory complications will improve Outcome: Adequate for Discharge   Problem: Skin Integrity: Goal: Risk for impaired skin integrity will decrease Outcome: Adequate for Discharge

## 2021-09-06 NOTE — Telephone Encounter (Signed)
Pt scheduled for TOC appt on 09/21/21 at 2:20 pm per Azalee Course. Please advise

## 2021-09-06 NOTE — Evaluation (Signed)
Occupational Therapy Evaluation Patient Details Name: Emily Robertson MRN: [id number] DOB: 06-27-1930 Today's Date: 09/06/2021   History of Present Illness 88 yo female admitted with HTN, acute HF. Hx of A fib with RVR, pelvic fx, OA, RA   Clinical Impression   Pt is typically mod I with RW/SPC and does her own bathing/dressing etc. She loves to cook but shares that it is getting harder because she does not have the energy for it. Today she is supervision for bed mobility, min guard to min A for transfers into standing. Episode of incontinence resulting in trip to the bathroom at min guard with RW for LB bathing at mod A and LB dressing with mod A for all clean undies/pads etc. Min guard for one grooming tasks in standing at the sink. Pt with SpO2 >90% on RA throughout session with random checks, but DOE with extended activity. At this time recommending HHOT post-acute to maximize safety and independence in ADL and functional transfers. Next session to bring energy conservation handout.      Recommendations for follow up therapy are one component of a multi-disciplinary discharge planning process, led by the attending physician.  Recommendations may be updated based on patient status, additional functional criteria and insurance authorization.   Follow Up Recommendations  Home health OT    Assistance Recommended at Discharge Intermittent Supervision/Assistance  Patient can return home with the following A little help with walking and/or transfers;A little help with bathing/dressing/bathroom;Assistance with cooking/housework;Assist for transportation;Help with stairs or ramp for entrance    Functional Status Assessment  Patient has had a recent decline in their functional status and demonstrates the ability to make significant improvements in function in a reasonable and predictable amount of time.  Equipment Recommendations  None recommended by OT (Pt has appropriate DME)     Recommendations for Other Services PT consult;Other (comment) (HH Aide)     Precautions / Restrictions Precautions Precautions: Fall Precaution Comments: monitor HR and O2 Restrictions Weight Bearing Restrictions: No      Mobility Bed Mobility Overal bed mobility: Needs Assistance Bed Mobility: Supine to Sit, Sit to Supine     Supine to sit: Supervision, HOB elevated Sit to supine: Supervision   General bed mobility comments: Supv for safety. Increased time.    Transfers Overall transfer level: Needs assistance Equipment used: Rolling walker (2 wheels) Transfers: Sit to/from Stand Sit to Stand: Min guard           General transfer comment: braces back of legs against bed      Balance Overall balance assessment: Needs assistance, History of Falls Sitting-balance support: No upper extremity supported, Feet supported Sitting balance-Leahy Scale: Good     Standing balance support: Bilateral upper extremity supported, During functional activity, Reliant on assistive device for balance Standing balance-Leahy Scale: Fair                             ADL either performed or assessed with clinical judgement   ADL Overall ADL's : Needs assistance/impaired Eating/Feeding: Modified independent   Grooming: Wash/dry hands;Min guard;Standing Grooming Details (indicate cue type and reason): sink level Upper Body Bathing: Supervision/ safety;Sitting   Lower Body Bathing: Min guard;Sitting/lateral leans   Upper Body Dressing : Minimal assistance;Sitting Upper Body Dressing Details (indicate cue type and reason): only bc gown so big Lower Body Dressing: Minimal assistance;Sit to/from stand Lower Body Dressing Details (indicate cue type and reason): to don socks, mesh underwear,  and pad Toilet Transfer: Min guard;Ambulation;Rolling walker (2 wheels)   Toileting- Clothing Manipulation and Hygiene: Moderate assistance;Sit to/from stand Toileting - Clothing  Manipulation Details (indicate cue type and reason): to manage mesh undies and pad     Functional mobility during ADLs: Min guard;Rolling walker (2 wheels) General ADL Comments: decreased activity tolerance     Vision Baseline Vision/History: 1 Wears glasses Ability to See in Adequate Light: 0 Adequate Patient Visual Report: No change from baseline       Perception     Praxis      Pertinent Vitals/Pain Pain Assessment Pain Assessment: No/denies pain     Hand Dominance Right   Extremity/Trunk Assessment Upper Extremity Assessment Upper Extremity Assessment: Generalized weakness   Lower Extremity Assessment Lower Extremity Assessment: Defer to PT evaluation   Cervical / Trunk Assessment Cervical / Trunk Assessment: Kyphotic   Communication Communication Communication: No difficulties   Cognition Arousal/Alertness: Awake/alert Behavior During Therapy: WFL for tasks assessed/performed Overall Cognitive Status: Within Functional Limits for tasks assessed                                       General Comments  HR up to 90 with hallway ambulation, SpO2 on RA >90%, DOE 2/4 with ambulation    Exercises     Shoulder Instructions      Home Living Family/patient expects to be discharged to:: Private residence Living Arrangements: Alone Available Help at Discharge: Family;Available PRN/intermittently Type of Home: House Home Access: Stairs to enter [employer] of Steps: 5 Entrance Stairs-Rails: Right;Left Home Layout: Two level;Able to live on main level with bedroom/bathroom     Bathroom Shower/Tub: Chief Strategy Officer: Standard Bathroom Accessibility: Yes How Accessible: Accessible via walker Home Equipment: Hand held shower head;Shower Counsellor (2 wheels);Grab bars - tub/shower   Additional Comments: thinking about moving into [facility]      Prior Functioning/Environment Prior Level of Function :  Independent/Modified Independent             Mobility Comments: uses cane/walker outside PRN ADLs Comments: independent, doesn't use the shower chair currently; still drives, loves to cook but having difficulty        OT Problem List: Decreased activity tolerance;Impaired balance (sitting and/or standing);Decreased safety awareness;Decreased knowledge of use of DME or AE;Cardiopulmonary status limiting activity      OT Treatment/Interventions: Self-care/ADL training;Energy conservation;DME and/or AE instruction;Therapeutic activities;Patient/family education;Balance training    OT Goals(Current goals can be found in the care plan section) Acute Rehab OT Goals Patient Stated Goal: be safe OT Goal Formulation: With patient Time For Goal Achievement: 09/20/21 Potential to Achieve Goals: Good ADL Goals Pt Will Perform Grooming: with modified independence;standing Pt Will Perform Upper Body Dressing: with modified independence;sitting Pt Will Perform Lower Body Dressing: with modified independence;sit to/from stand Pt Will Transfer to Toilet: with modified independence;ambulating Pt Will Perform Toileting - Clothing Manipulation and hygiene: with modified independence;sit to/from stand Pt Will Perform Tub/Shower Transfer: Tub transfer;ambulating;shower seat;rolling walker Additional ADL Goal #1: Pt will verbalize 3 energy conservation strategies for IADL with no cues  OT Frequency: Min 2X/week    Co-evaluation              AM-PAC OT "6 Clicks" Daily Activity     Outcome Measure Help from another person eating meals?: None Help from another person taking care of personal grooming?: A Little Help  from another person toileting, which includes using toliet, bedpan, or urinal?: A Lot Help from another person bathing (including washing, rinsing, drying)?: A Little Help from another person to put on and taking off regular upper body clothing?: A Little Help from another person to  put on and taking off regular lower body clothing?: A Lot 6 Click Score: 17   End of Session Equipment Utilized During Treatment: Gait belt;Rolling walker (2 wheels) Nurse Communication: Mobility status;Precautions;Other (comment) (needs purewick)  Activity Tolerance: Patient tolerated treatment well Patient left: in chair;with call bell/phone within reach;with chair alarm set  OT Visit Diagnosis: Other abnormalities of gait and mobility (R26.89);Muscle weakness (generalized) (M62.81);History of falling (Z91.81)                Time: 3143-8887 OT Time Calculation (min): 31 min Charges:  OT General Charges $OT Visit: 1 Visit OT Evaluation $OT Eval Moderate Complexity: 1 Mod OT Treatments $Self Care/Home Management : 8-22 mins  Nyoka Cowden OTR/L Acute Rehabilitation Services Office: [phone]  Evern Bio [facility] 09/06/2021, 1:55 PM

## 2021-09-06 NOTE — Progress Notes (Signed)
Progress Note  Patient Name: Emily Robertson Date of Encounter: 09/06/2021  [facility] Cardiologist: Kirk Ruths, MD   Subjective   Breathing close to baseline, walks very little since she broke her pelvis, uses a walker  Inpatient Medications    Scheduled Meds:  apixaban  2.5 mg Oral BID   diltiazem  180 mg Oral Daily   famotidine  20 mg Oral BID   feeding supplement  237 mL Oral TID BM   furosemide  40 mg Intravenous Daily   latanoprost  1 drop Left Eye QHS   leflunomide  10 mg Oral q morning   loratadine  10 mg Oral Daily   methimazole  10 mg Oral BID   metoprolol succinate  150 mg Oral q morning   montelukast  10 mg Oral QPM   multivitamin with minerals  1 tablet Oral q morning   omega-3 acid ethyl esters  1 g Oral Daily   predniSONE  2.5 mg Oral Q breakfast   timolol  1 drop Both Eyes BID   Continuous Infusions:   PRN Meds: acetaminophen **OR** acetaminophen, ondansetron **OR** ondansetron (ZOFRAN) IV, mouth rinse   Vital Signs    Vitals:   09/05/21 2019 09/06/21 0500 09/06/21 0538 09/06/21 0947  BP: 109/64  139/85 (!) 123/58  Pulse: 86  85   Resp: 16  20   Temp: 98.1 F (36.7 C)  97.8 F (36.6 C)   TempSrc: Oral  Oral   SpO2: 93%  94%   Weight:  48.7 kg    Height:        Intake/Output Summary (Last 24 hours) at 09/06/2021 1056 Last data filed at 09/06/2021 0900 Gross per 24 hour  Intake 960 ml  Output 700 ml  Net 260 ml      09/06/2021    5:00 AM 09/05/2021    5:00 AM 09/04/2021    5:00 AM  Last 3 Weights  Weight (lbs) 107 lb 5.8 oz 107 lb 9.4 oz 106 lb 0.7 oz  Weight (kg) 48.7 kg 48.8 kg 48.1 kg      Telemetry    Atrial fib, rate generally controlled, PVCs - Personally Reviewed  ECG    N/a - Personally Reviewed  Physical Exam   VS:  BP (!) 123/58   Pulse 85   Temp 97.8 F (36.6 C) (Oral)   Resp 20   Ht _0  (1.626 m)   Wt 48.7 kg   SpO2 94%   BMI 18.43 kg/m  , BMI Body mass index is 18.43 kg/m. General: frail,  elderly, female in no acute distress Head: Eyes PERRLA, Head normocephalic and atraumatic Lungs: clear bilaterally to auscultation. Heart: Irreg R&R S1 S2, without rub or gallop. 2-3/6 murmur. 4/4 extremity pulses are 2+ & equal.  JVD 8 cm Abdomen: Bowel sounds are present, abdomen soft and non-tender without masses or  hernias noted. Msk: Normal strength and tone for age. Extremities: No clubbing, cyanosis or edema.    Skin:  No rashes or lesions noted. Neuro: Alert and oriented X 3. Psych:  Good affect, responds appropriately   Labs    High Sensitivity Troponin:   Recent Labs  Lab 09/03/21 1100 09/03/21 2111  TROPONINIHS 24* 20*     Chemistry Recent Labs  Lab 09/03/21 1100 09/03/21 2111 09/04/21 0018 09/04/21 0430 09/05/21 0453 09/06/21 0512  NA 127* 131* 131* 135 133* 131*  K 4.6 4.0 4.2 3.6 3.6 4.2  CL 95* 92* 95* 96* 95*  93*  CO2 21* _0 GLUCOSE 213* 169* 119* 105* 102* 108*  BUN _1 24*  CREATININE 0.65 0.93 0.76 0.87 0.86 0.80  CALCIUM 8.9 9.1 8.6* 8.9 9.1 9.3  PROT 6.2*  --   --   --   --  6.1*  ALBUMIN 3.4* 3.9 3.2* 3.1*  --  3.2*  AST 55*  --   --   --   --  110*  ALT 159*  --   --   --   --  155*  ALKPHOS 456*  --   --   --   --  670*  BILITOT 1.5* 1.2  --   --   --  1.3*  GFRNONAA >60 59* >60 >60 >60 >60  ANIONGAP _2 Lipids No results for input(s): "CHOL", "TRIG", "HDL", "LABVLDL", "LDLCALC", "CHOLHDL" in the last 168 hours.  Hematology Recent Labs  Lab 09/03/21 1100 09/04/21 0018 09/06/21 0512  WBC 12.5* 11.9* 9.9  RBC 4.10 4.01 4.45  HGB 12.1 12.0 13.4  HCT 36.2 36.2 39.0  MCV 88.3 90.3 87.6  MCH 29.5 29.9 30.1  MCHC 33.4 33.1 34.4  RDW 15.5 15.9* 16.1*  PLT 266 236 270   Thyroid  Recent Labs  Lab 09/05/21 1319  TSH 1.062  FREET4 1.49*    BNP Recent Labs  Lab 09/03/21 0935  BNP 1,182.0*    DDimer No results for input(s): "DDIMER" in the last 168 hours.   Radiology    ECHO  TEE  Result Date: 09/05/2021    TRANSESOPHOGEAL ECHO REPORT   Patient Name:   SHAYNA EBLEN Date of Exam: 09/05/2021 Medical Rec #:  [id number]         Height:       64.0 in Accession #:    [id number]        Weight:       107.6 lb Date of Birth:  1930/05/17         BSA:          1.503 m Patient Age:    47 years          BP:           143/85 mmHg Patient Gender: F                 HR:           98 bpm. Exam Location:  Inpatient Procedure: Transesophageal Echo, 3D Echo, Cardiac Doppler and Color Doppler Indications:     I34.0 Nonrheumatic mitral (valve) insufficiency  History:         Patient has prior history of Echocardiogram examinations. CHF,                  Abnormal ECG, Mitral Valve Disease, Arrythmias:Atrial                  Fibrillation, Signs/Symptoms:Dyspnea and Shortness of Breath;                  Risk Factors:Sleep Apnea.  Sonographer:     Roseanna Rainbow RDCS Referring Phys:  [id number] [place] [facility] Diagnosing Phys: Sanda Klein MD PROCEDURE: After discussion of the risks and benefits of a TEE, an informed consent was obtained from the patient. The transesophogeal probe was passed without difficulty through the esophogus of the patient. Imaged were obtained with the patient in a left lateral decubitus position. Sedation  performed by different physician. The patient was monitored while under deep sedation. Anesthestetic sedation was provided intravenously by Anesthesiology: 157m of Propofol. The patient's vital signs; including heart rate, blood pressure, and oxygen saturation; remained stable throughout the procedure. The patient developed no complications during the procedure. IMPRESSIONS  1. Left ventricular ejection fraction, by estimation, is 65 to 70%. The left ventricle has normal function. The left ventricle has no regional wall motion abnormalities. Left ventricular diastolic function could not be evaluated.  2. Right ventricular systolic function is normal. The right ventricular size is  normal.  3. Left atrial size was severely dilated. No left atrial/left atrial appendage thrombus was detected. The LAA emptying velocity was 27 cm/s.  4. Right atrial size was mildly dilated.  5. A single ruptured chorda tendina is seen, attached to the mid portion of P2 (middle) scallop of the posterior leaflet. There is a fairly short arc of the P2 scallop with flail motion. Flail gap 5 mm, flail width 9 mm. Posterior leaflet length 15 mm, valve area 6 cm sq, mean gradient 2 mm Hg at 111 bpm. MR 2D vena contracta is 5 mm. 3D guided effective regurgitant orifice area measures 0.34 cm sq, regurgitant volume 45 mL. There is blunted, but antegrade systolic flow in the right and left pulmonary veins. Due to atrial fibrillation, unable to accurately assess via PISA method. The mitral valve is myxomatous. Moderate to severe mitral valve regurgitation. No evidence of mitral stenosis.  6. The tricuspid valve is myxomatous.  7. The aortic valve is tricuspid. Aortic valve regurgitation is mild.  8. There is mild (Grade II) plaque involving the ascending aorta. FINDINGS  Left Ventricle: Left ventricular ejection fraction, by estimation, is 65 to 70%. The left ventricle has normal function. The left ventricle has no regional wall motion abnormalities. The left ventricular internal cavity size was normal in size. There is  no left ventricular hypertrophy. Left ventricular diastolic function could not be evaluated due to atrial fibrillation. Left ventricular diastolic function could not be evaluated. Right Ventricle: The right ventricular size is normal. No increase in right ventricular wall thickness. Right ventricular systolic function is normal. Left Atrium: Left atrial size was severely dilated. No left atrial/left atrial appendage thrombus was detected. The LAA emptying velocity was 27 cm/s. Right Atrium: Right atrial size was mildly dilated. Pericardium: There is no evidence of pericardial effusion. Mitral Valve: A single  ruptured chorda tendina is seen, attached to the mid portion of P2 (middle) scallop of the posterior leaflet. There is a fairly short arc of the P2 scallop with flail motion. Flail gap 5 mm, flail width 9 mm. Posterior leaflet length 15 mm, valve area 6 cm sq, mean gradient 2 mm Hg at 111 bpm. MR 2D vena contracta is 5 mm. 3D guided effective regurgitant orifice area measures 0.34 cm sq, regurgitant volume 45 mL. There is blunted, but antegrade systolic flow in the right and left pulmonary veins. Due to atrial fibrillation, unable to accurately assess via PISA method. The mitral valve is myxomatous. Moderate to severe mitral valve regurgitation, with eccentric anteriorly directed jet. No evidence of mitral valve stenosis. MV  peak gradient, 6.7 mmHg. The mean mitral valve gradient is 2.0 mmHg with average heart rate of 111 bpm. Tricuspid Valve: The tricuspid valve is myxomatous. Tricuspid valve regurgitation is mild. Aortic Valve: The aortic valve is tricuspid. Aortic valve regurgitation is mild. Pulmonic Valve: The pulmonic valve was normal in structure. Pulmonic valve regurgitation is mild. Aorta: The  aortic root, ascending aorta and aortic arch are all structurally normal, with no evidence of dilitation or obstruction. There is mild (Grade II) plaque involving the ascending aorta. IAS/Shunts: No atrial level shunt detected by color flow Doppler.  MITRAL VALVE MV Peak grad: 6.7 mmHg MV Mean grad: 2.0 mmHg MV Vmax:      1.29 m/s MV Vmean:     68.4 cm/s MR Peak grad:    78.1 mmHg MR Mean grad:    50.0 mmHg MR Vmax:         442.00 cm/s MR Vmean:        335.0 cm/s MR PISA:         1.57 cm MR PISA Eff ROA: 13 mm MR PISA Radius:  0.50 cm Mihai Croitoru MD Electronically signed by Sanda Klein MD Signature Date/Time: 09/05/2021/11:40:18 AM    Final    ECHOCARDIOGRAM LIMITED  Result Date: 09/04/2021    ECHOCARDIOGRAM LIMITED REPORT   Patient Name:   TATELYN VANHECKE Date of Exam: 09/04/2021 Medical Rec #:   [id number]         Height:       64.0 in Accession #:    [id number]        Weight:       106.0 lb Date of Birth:  18-Nov-1930         BSA:          1.494 m Patient Age:    58 years          BP:           114/82 mmHg Patient Gender: F                 HR:           84 bpm. Exam Location:  Inpatient Procedure: 3D Echo, Limited Echo, Cardiac Doppler and Color Doppler Indications:    I50.40* Unspecified combined systolic (congestive) and diastolic                 (congestive) heart failure; I34.0 Nonrheumatic mitral (valve)                 insufficiency  History:        Patient has prior history of Echocardiogram examinations, most                 recent 07/26/2021. CHF, Abnormal ECG, Mitral Valve Disease,                 Arrythmias:Atrial Fibrillation, Signs/Symptoms:Shortness of                 Breath and Dyspnea; Risk Factors:Dyslipidemia and Hypertension.  Sonographer:    Roseanna Rainbow RDCS Referring Phys: [id number] [place]  1. Left ventricular ejection fraction, by estimation, is 50 to 55%. The left ventricle has low normal function. There is moderate concentric left ventricular hypertrophy.  2. Left atrial size was severely dilated.  3. Right atrial size was severely dilated.  4. Eccentric anteriorly directed jet. Posterior leaflet appears prolapsed. The mitral valve is degenerative. Moderate to severe mitral valve regurgitation.  5. Tricuspid valve regurgitation is mild to moderate.  6. Aortic valve regurgitation is trivial.  7. There is moderately elevated pulmonary artery systolic pressure. Comparison(s): No significant change from prior study. Conclusion(s)/Recommendation(s): Recommend TEE for better assessment of MR etiology and severity. FINDINGS  Left Ventricle: Left ventricular ejection fraction, by estimation, is 50 to 55%. The left ventricle has low normal function. There  is moderate concentric left ventricular hypertrophy. Right Ventricle: There is moderately elevated pulmonary artery  systolic pressure. The tricuspid regurgitant velocity is 2.78 m/s, and with an assumed right atrial pressure of 15 mmHg, the estimated right ventricular systolic pressure is 85.6 mmHg. Left Atrium: Left atrial size was severely dilated. Right Atrium: Right atrial size was severely dilated. Mitral Valve: Eccentric anteriorly directed jet. Posterior leaflet appears prolapsed. The mitral valve is degenerative in appearance. Moderate to severe mitral valve regurgitation. Tricuspid Valve: Tricuspid valve regurgitation is mild to moderate. Aortic Valve: Aortic valve regurgitation is trivial. LEFT VENTRICLE PLAX 2D LVIDd:         3.70 cm LVIDs:         2.80 cm LV PW:         1.40 cm LV IVS:        1.40 cm                            3D Volume EF:                            3D EF:        52 % LV Volumes (MOD)           LV EDV:       87 ml LV vol d, MOD A2C: 74.6 ml LV ESV:       42 ml LV vol d, MOD A4C: 60.5 ml LV SV:        46 ml LV vol s, MOD A2C: 38.8 ml LV vol s, MOD A4C: 32.1 ml LV SV MOD A2C:     35.8 ml LV SV MOD A4C:     60.5 ml LV SV MOD BP:      31.8 ml IVC IVC diam: 2.00 cm LEFT ATRIUM              Index LA diam:        3.95 cm  2.64 cm/m LA Vol (A2C):   98.2 ml  65.73 ml/m LA Vol (A4C):   118.0 ml 78.98 ml/m LA Biplane Vol: 110.0 ml 73.63 ml/m   AORTA Ao Root diam: 3.20 cm MR Peak grad:    81.5 mmHg    TRICUSPID VALVE MR Mean grad:    48.0 mmHg    TR Peak grad:   30.9 mmHg MR Vmax:         451.50 cm/s  TR Vmax:        278.00 cm/s MR Vmean:        315.0 cm/s MR PISA:         3.08 cm MR PISA Eff ROA: 26 mm MR PISA Radius:  0.70 cm Phineas Inches Electronically signed by Phineas Inches Signature Date/Time: 09/04/2021/12:20:21 PM    Final     Cardiac Studies   TEE: 09/05/2021 FINDINGS:  Myxomatous mitral valve with ruptured chord to the P2 (middle) scallop of the posterior leaflet. There is flail motion of a relatively short segment of the P2 scallop. There is highly eccentric moderate to severe mitral  insufficiency. There is no systolic flow reversal in the pulmonary veins. Normal left ventricular systolic function. Mild AI, mild TR, normal PAP. No pericardial effusion.  RECOMMENDATIONS:     Consider MitraClip.  Echo 09/05/21:  1. Left ventricular ejection fraction, by estimation, is 50 to 55%. The  left ventricle has low normal function. There is moderate  concentric left ventricular hypertrophy.   2. Left atrial size was severely dilated.   3. Right atrial size was severely dilated.   4. Eccentric anteriorly directed jet. Posterior leaflet appears  prolapsed. The mitral valve is degenerative. Moderate to severe mitral valve regurgitation.   5. Tricuspid valve regurgitation is mild to moderate.   6. Aortic valve regurgitation is trivial.   7. There is moderately elevated pulmonary artery systolic pressure.   Patient Profile     Ms. Jerde is an 39F with persistent atrial fibrillation, hypertension, hyperlipidemia, hyperthyroidism, and rheumatoid arthritis admitted with atrial fibrillation and acute heart failure.  Assessment & Plan    # Persistent atrial fibrillation:  - improved rate control on Dilt/metop, no doses missed - both atrial severely dilated - in setting of hyperthyroid, would not try to get her in SR at this time, f/u as outpt - continue eliquis  #Moderate to severe affect regurgitation: - see TEE report above, consider Mitraclip as outpt.  - watch volume carefully  # Acute on chronic diastolic heart failure:  - EF down slightly from previous - may be 2nd Afib - still w/ some volume overload on exam - on Lasix 40 mg IV qd, may need to increase - I/O net neg 1 L since admit, suspect UOP incomplete - wt no change overnight, up 2 lbs since admit - BUN trending up, Cr stable, follow  # Pulmonary nodule:  Per Pulmonary.  No plans for inpatient procedures.   #Abnl LFTs -- ALT is stable, mildly improved, but AST and Alk Phos are trending up  - Alk Phos now  670, AST 110      For questions or updates, please contact [place] Please consult [email or web address] for contact info under        Signed, Rosaria Ferries, PA-C  09/06/2021, 10:56 AM

## 2021-09-06 NOTE — Care Management Important Message (Signed)
Important Message  Patient Details IM Letter given to the Patient. Name: Emily Robertson MRN: [id number] Date of Birth: [birth year]   Medicare Important Message Given:  Yes     Caren Macadam 09/06/2021, 12:52 PM

## 2021-09-06 NOTE — Progress Notes (Signed)
Speech Language Pathology Treatment: Dysphagia  Patient Details Name: Emily Robertson MRN: [id number] DOB: August 30, 1930 Today's Date: 09/06/2021 Time: 0383-3383 SLP Time Calculation (min) (ACUTE ONLY): 12 min  Assessment / Plan / Recommendation Clinical Impression  Pt seen at bedside for skilled ST intervention targeting goals for dysphagia. RN reports no swallowing difficulty observed or reported. Pt was seated in recliner upon arrival. She is hoping for discharge home today. Pt was observed drinking thin liquids, and eating puree and solid textures. She continues to report difficulty with dry, tough solids, and so was encouraged to moisten those foods with an appropriate agent (butter, juice, sour cream, etc). No overt s/s aspiration observed on textures presented today. ST signing off at this time. Please reconsult if needs arise.    HPI HPI: Patient is an 88 y.o. female with PMH: acute HF, h/o afib with RVR, pelvic fracture, OA, RA. She was admitted on 09/03/21 with HTN. She c/o some dysphagia symptoms to RD which prompted order for SLP swallow eval.      SLP Plan  Discharge SLP treatment due to goals met      Recommendations for follow up therapy are one component of a multi-disciplinary discharge planning process, led by the attending physician.  Recommendations may be updated based on patient status, additional functional criteria and insurance authorization.    Recommendations  Diet recommendations: Regular;Dysphagia 3 (mechanical soft);Thin liquid Liquids provided via: Cup;Straw Medication Administration: Whole meds with liquid Supervision: Patient able to self feed Compensations: Slow rate;Small sips/bites Postural Changes and/or Swallow Maneuvers: Seated upright 90 degrees;Upright 30-60 min after meal                Oral Care Recommendations: Oral care BID Follow Up Recommendations: No SLP follow up Assistance recommended at discharge: None SLP Visit Diagnosis:  Dysphagia, unspecified (R13.10) Plan: Discharge SLP treatment due to (comment)          Desere Gwin B. Quentin Ore, [facility], [place] Speech Language Pathologist Office: [phone]  Shonna Chock 09/06/2021, 3:05 PM

## 2021-09-07 ENCOUNTER — Telehealth: Payer: Self-pay | Admitting: Cardiology

## 2021-09-07 NOTE — Telephone Encounter (Signed)
Patient contacted regarding discharge from  on 09/06/21.  Patient understands to follow up with provider meng on 09/21/21 at 2:20pm at [facility]. Patient understands discharge instructions? yes Patient understands medications and regiment? yes Patient understands to bring all medications to this visit? yes

## 2021-09-07 NOTE — Telephone Encounter (Addendum)
  [facility]   MULTIDISCIPLINARY HEART VALVE TEAM   MitraClip review per industry:   "This looks like a suitable valve for a MitraClip. The fossa looks approachable for transseptal puncture in the SAXB and Bicaval views. TR is noted. LA dimensions are large enough for device steering and straddle. The posterior leaflet is flailed causing an anteriorly directed jet on A2/P2. The posterior leaflet measures about 1.23 cm in the 116 LVOT degree grasping view. MVA measures 5.99 cm2. I'd like to verify gradient in the procedure prior to the start. If the MVA and gradient allow, I'd recommend starting with an XTW and assess for gradient."      Georgie Chard NP-C Structural Heart Team  Pager: [phone] Phone: [phone]

## 2021-09-12 DIAGNOSIS — M069 Rheumatoid arthritis, unspecified: Secondary | ICD-10-CM | POA: Diagnosis not present

## 2021-09-12 DIAGNOSIS — I10 Essential (primary) hypertension: Secondary | ICD-10-CM | POA: Diagnosis not present

## 2021-09-12 DIAGNOSIS — I4891 Unspecified atrial fibrillation: Secondary | ICD-10-CM | POA: Diagnosis not present

## 2021-09-12 DIAGNOSIS — M810 Age-related osteoporosis without current pathological fracture: Secondary | ICD-10-CM | POA: Diagnosis not present

## 2021-09-12 DIAGNOSIS — E0590 Thyrotoxicosis, unspecified without thyrotoxic crisis or storm: Secondary | ICD-10-CM | POA: Diagnosis not present

## 2021-09-12 DIAGNOSIS — K219 Gastro-esophageal reflux disease without esophagitis: Secondary | ICD-10-CM | POA: Diagnosis not present

## 2021-09-12 DIAGNOSIS — J45909 Unspecified asthma, uncomplicated: Secondary | ICD-10-CM | POA: Diagnosis not present

## 2021-09-12 DIAGNOSIS — H409 Unspecified glaucoma: Secondary | ICD-10-CM | POA: Diagnosis not present

## 2021-09-12 DIAGNOSIS — D6869 Other thrombophilia: Secondary | ICD-10-CM | POA: Diagnosis not present

## 2021-09-18 ENCOUNTER — Encounter: Payer: Self-pay | Admitting: Internal Medicine

## 2021-09-18 ENCOUNTER — Ambulatory Visit: Payer: Medicare PPO | Admitting: Internal Medicine

## 2021-09-18 VITALS — BP 128/82 | HR 62 | Ht 64.0 in | Wt 102.2 lb

## 2021-09-18 DIAGNOSIS — I340 Nonrheumatic mitral (valve) insufficiency: Secondary | ICD-10-CM

## 2021-09-18 DIAGNOSIS — R748 Abnormal levels of other serum enzymes: Secondary | ICD-10-CM | POA: Diagnosis not present

## 2021-09-18 DIAGNOSIS — I4891 Unspecified atrial fibrillation: Secondary | ICD-10-CM

## 2021-09-18 DIAGNOSIS — E0590 Thyrotoxicosis, unspecified without thyrotoxic crisis or storm: Secondary | ICD-10-CM | POA: Diagnosis not present

## 2021-09-18 DIAGNOSIS — I5033 Acute on chronic diastolic (congestive) heart failure: Secondary | ICD-10-CM | POA: Diagnosis not present

## 2021-09-18 NOTE — Progress Notes (Signed)
Patient ID: Emily Robertson MRN: [id number] DOB/AGE: July 25, 1930 88 y.o.  Primary Care Physician:Miller, Rolm Bookbinder, FNP Primary Cardiologist: Olga Millers, MD   FOCUSED CARDIOVASCULAR PROBLEM LIST:   1.  Paroxysmal atrial fibrillation on apixaban; CV 2 score of 5 2.  Rheumatoid arthritis on Leflunomide 3.  Hypertension 4.  Osteoporosis 5.  Severe degenerative mitral regurgitation with ruptured cord involving P2; mean gradient of 2 mmHg with valve area of 6 cm 6.  Frailty   HISTORY OF PRESENT ILLNESS: The patient is a 88 y.o. female with the indicated medical history here for recommendations regarding her mitral regurgitation.  The patient presented to [facility] in June 2023.  At that point in time she had been admitted to [facility] long hospital after a fall and fracture.  She was noted to be in atrial fibrillation which was new for her.  She was started on a Cardizem drip.  Because of frequent falls she was not thought to be a good candidate for anticoagulation however after family discussion low-dose apixaban was started.  She was discharged and then presented again in late July with acute on chronic diastolic heart failure.  She underwent a TEE which demonstrated severe degenerative mitral regurgitation.  The patient is here with her son.  She is doing fairly well at home however she tells me that she has been tiring out more quickly than before.  Last year she used to be able to do gardening work and would walk around her neighborhood.  Now she has more problems with easy fatigability.  She denies any significant shortness of breath however.  She denies any peripheral edema, paroxysmal nocturnal dyspnea, or orthopnea.  She has had no recurrent falls and has been tolerating anticoagulation well.  Overall she feels like she is getting somewhat better but again endorses easy fatigability which has been worse than a year ago.  She sees a Education officer, community on a regular basis and has good  dental health.  She is walking with a walker now after her fall.  She did not use a walker before.  Past Medical History:  Diagnosis Date   Arthritis    Glaucoma of both eyes    Hyperlipidemia    Hypertension    Pelvic prolapse    anterior floor   Urgency of urination    Urinary hesitancy    Urticaria     Past Surgical History:  Procedure Laterality Date   PUBOVAGINAL SLING N/A 08/31/2012   Procedure: BOSTON SCIENTIFIC UPHOLD LIGHT SACROSPINUS REPAIR;  Surgeon: Kathi Ludwig, MD;  Location: [facility];  Service: Urology;  Laterality: N/A;   RETINAL DETACHMENT SURGERY Right    TEE WITHOUT CARDIOVERSION N/A 09/05/2021   Procedure: TRANSESOPHAGEAL ECHOCARDIOGRAM (TEE);  Surgeon: Thurmon Fair, MD;  Location: [facility] ENDOSCOPY;  Service: Cardiovascular;  Laterality: N/A;    Family History  Problem Relation Age of Onset   Hypertension Mother    Thyroid disease Neg Hx    Diabetes Neg Hx     Social History   Socioeconomic History   Marital status: Widowed    Spouse name: Not on file   Number of children: Not on file   Years of education: Not on file   Highest education level: Not on file  Occupational History   Not on file  Tobacco Use   Smoking status: Never   Smokeless tobacco: Never  Substance and Sexual Activity   Alcohol use: Yes    Comment: rare  Drug use: No   Sexual activity: Not on file  Other Topics Concern   Not on file  Social History Narrative   Not on file   Social Determinants of Health   Financial Resource Strain: Not on file  Food Insecurity: Not on file  Transportation Needs: Not on file  Physical Activity: Not on file  Stress: Not on file  Social Connections: Not on file  Intimate Partner Violence: Not on file     Prior to Admission medications   Medication Sig Start Date End Date Taking? Authorizing Provider  acetaminophen (TYLENOL) 325 MG tablet Take 1 tablet (325 mg total) by mouth daily with supper. 09/06/21   Hongalgi,  Maximino Greenland, MD  alendronate (FOSAMAX) 70 MG tablet Take 70 mg by mouth every Wednesday. 08/10/14   [provider]  apixaban (ELIQUIS) 2.5 MG TABS tablet Take 1 tablet (2.5 mg total) by mouth 2 (two) times daily. 07/31/21   Alwyn Ren, MD  Ascorbic Acid (VITAMIN C PO) Take 1 tablet by mouth every morning.    [provider]  CALCIUM PO Take 1 tablet by mouth daily with supper.    [provider]  cetirizine (ZYRTEC) 10 MG tablet Take 1 tablet (10 mg total) by mouth 2 (two) times daily. 10/28/19   Marcelyn Bruins, MD  Cholecalciferol (VITAMIN D3 PO) Take 1 capsule by mouth daily with supper.    [provider]  diltiazem (CARDIZEM CD) 180 MG 24 hr capsule Take 1 capsule (180 mg total) by mouth daily. 08/01/21   Alwyn Ren, MD  EPINEPHrine (EPIPEN) 0.3 mg/0.3 mL IJ SOAJ injection Inject 0.3 mg into the muscle once as needed for anaphylaxis. 09/06/21   Hongalgi, Maximino Greenland, MD  famotidine (PEPCID) 20 MG tablet Take 1 tablet (20 mg total) by mouth 2 (two) times daily. 10/28/19   Marcelyn Bruins, MD  furosemide (LASIX) 40 MG tablet Take 1 tablet (40 mg total) by mouth daily. 09/07/21   Hongalgi, Maximino Greenland, MD  latanoprost (XALATAN) 0.005 % ophthalmic solution Place 1 drop into the left eye at bedtime. 09/14/19   [provider]  leflunomide (ARAVA) 10 MG tablet Take 10 mg by mouth every morning. 07/24/21   [provider]  methimazole (TAPAZOLE) 10 MG tablet Take 1 tablet (10 mg total) by mouth 2 (two) times daily. 07/31/21   Alwyn Ren, MD  metoprolol succinate (TOPROL-XL) 100 MG 24 hr tablet Take 1.5 tablets (150 mg total) by mouth every morning. 09/07/21   Hongalgi, Maximino Greenland, MD  Misc Natural Products (OSTEO BI-FLEX JOINT SHIELD PO) Take 1 capsule by mouth daily with supper.    [provider]  montelukast (SINGULAIR) 10 MG tablet Take 1 tablet (10 mg total) by mouth daily. Patient taking differently: Take 10  mg by mouth every evening. 12/06/19   Marcelyn Bruins, MD  Multiple Vitamin (MULTIVITAMIN WITH MINERALS) TABS tablet Take 1 tablet by mouth every morning.    [provider]  Omega-3 Fatty Acids (FISH OIL) 1000 MG CAPS Take 1,000 mg by mouth every morning.    [provider]  predniSONE (DELTASONE) 2.5 MG tablet Take 1 tablet (2.5 mg total) by mouth daily with breakfast. 08/01/21   Alwyn Ren, MD  Probiotic Product (PROBIOTIC DAILY PO) Take 1 capsule by mouth daily with supper.    [provider]  timolol (TIMOPTIC) 0.5 % ophthalmic solution Place 1 drop into both eyes 2 (two) times daily.  [provider]    Allergies  Allergen Reactions   Cozaar [Losartan] Swelling    Tongue swelling   Erythromycin Itching   Azopt [Brinzolamide] Rash    Rash on eyelid    REVIEW OF SYSTEMS:  General: no fevers/chills/night sweats Eyes: no blurry vision, diplopia, or amaurosis ENT: no sore throat or hearing loss Resp: no cough, wheezing, or hemoptysis CV: no edema or palpitations GI: no abdominal pain, nausea, vomiting, diarrhea, or constipation GU: no dysuria, frequency, or hematuria Skin: no rash Neuro: no headache, numbness, tingling, or weakness of extremities Musculoskeletal: no joint pain or swelling Heme: no bleeding, DVT, or easy bruising Endo: no polydipsia or polyuria  BP 128/82   Pulse 62   Ht 5\' 4"  (1.626 m)   Wt 102 lb 3.2 oz (46.4 kg)   SpO2 99%   BMI 17.54 kg/m   PHYSICAL EXAM: GEN:  AO x 3 in no acute distress, frail HEENT: normal Dentition: Good Neck: JVP normal. +2 carotid upstrokes without bruits. No thyromegaly. Lungs: equal expansion, clear bilaterally CV: Apex is discrete and nondisplaced, regular rate and rhythm with 3 out of 6 holosystolic murmur Abd: soft, non-tender, non-distended; no bruit; positive bowel sounds Ext: no edema, ecchymoses, or cyanosis Vascular: 2+ femoral pulses, 2+ radial pulses        Skin: warm and dry without rash Neuro: CN II-XII grossly intact; motor and sensory grossly intact    DATA AND STUDIES:  EKG: July 2023 atrial fibrillation with a controlled ventricular rate  2D ECHO: July 2023 ejection fraction of 50 to 55% with concentric left ventricular hypertrophy and moderate to severe mitral regurgitation  TEE: Ejection fraction is 65 to 70% with ruptured cord in the middle of P2 with fill gap of 5 mm and flail with of 9 mm with posterior leaflet length of 1.5 cm with a valve area of 6 cm grade and mean gradient 2 mmHg  CARDIAC CATH: n/a  STS RISK CALCULATOR: pending  NHYA CLASS: 2/3    ASSESSMENT AND PLAN:   Nonrheumatic mitral valve regurgitation  Atrial fibrillation with RVR (HCC)  The patient has developed severe degenerative mitral regurgitation.  I think she is symptomatic with easy fatigability.  Right now she and her son would like to see how she does over the next several months before considering an intervention.  I think this is reasonable.  Certainly if she were to be admitted with recurrent heart failure this may change our thinking about this.  I think her anatomy is very good for MitraClip therapy as her pathology is in the middle of P2.  I did tell this to the patient and her son.  I will see her back in 3 months to revisit things.  I did tell her and her son to contact August 2023 if she feels worse prior to her next appointment.  She will be following with general cardiology as well.  I have personally reviewed the patients imaging data as summarized above.  I have reviewed the natural history of mitral regurgitation with the patient and family members who are present today. We have discussed the limitations of medical therapy and the poor prognosis associated with symptomatic mitral regurgitation. We have also reviewed potential treatment options, including palliative medical therapy, conventional mitral surgery, and transcatheter mitral edge-to-edge  repair. We discussed treatment options in the context of this patient's specific comorbid medical conditions.   All of the patient's questions were answered today. Will make further recommendations based on the  results of studies outlined above.   Total time spent with patient today  60 minutes. This includes reviewing records, evaluating the patient and coordinating care.   Orbie Pyo, MD  09/18/2021 5:04 PM    [facility] [address] [place], [place], [place]  [address] Phone: [phone]; Fax: [phone]

## 2021-09-18 NOTE — Patient Instructions (Signed)
Medication Instructions:  No changes *If you need a refill on your cardiac medications before your next appointment, please call your pharmacy*   Lab Work: none If you have labs (blood work) drawn today and your tests are completely normal, you will receive your results only by: MyChart Message (if you have MyChart) OR A paper copy in the mail If you have any lab test that is abnormal or we need to change your treatment, we will call you to review the results.   Testing/Procedures: none   Follow-Up: At [facility], you and your health needs are our priority.  As part of our continuing mission to provide you with exceptional heart care, we have created designated Provider Care Teams.  These Care Teams include your primary Cardiologist (physician) and Advanced Practice Providers (APPs -  Physician Assistants and Nurse Practitioners) who all work together to provide you with the care you need, when you need it.   Your next appointment:   3 month(s)  The format for your next appointment:   In Person  Provider:   Alverda Skeans, MD     Other Instructions   Important Information About Sugar

## 2021-09-21 ENCOUNTER — Encounter: Payer: Self-pay | Admitting: Physician Assistant

## 2021-09-21 ENCOUNTER — Ambulatory Visit: Payer: Medicare PPO | Admitting: Physician Assistant

## 2021-09-21 VITALS — BP 140/82 | HR 88 | Ht 62.0 in | Wt 102.4 lb

## 2021-09-21 DIAGNOSIS — I10 Essential (primary) hypertension: Secondary | ICD-10-CM | POA: Diagnosis not present

## 2021-09-21 DIAGNOSIS — E785 Hyperlipidemia, unspecified: Secondary | ICD-10-CM | POA: Diagnosis not present

## 2021-09-21 DIAGNOSIS — I480 Paroxysmal atrial fibrillation: Secondary | ICD-10-CM

## 2021-09-21 DIAGNOSIS — I4819 Other persistent atrial fibrillation: Secondary | ICD-10-CM

## 2021-09-21 DIAGNOSIS — E0590 Thyrotoxicosis, unspecified without thyrotoxic crisis or storm: Secondary | ICD-10-CM | POA: Diagnosis not present

## 2021-09-21 DIAGNOSIS — I340 Nonrheumatic mitral (valve) insufficiency: Secondary | ICD-10-CM

## 2021-09-21 DIAGNOSIS — R7401 Elevation of levels of liver transaminase levels: Secondary | ICD-10-CM | POA: Diagnosis not present

## 2021-09-21 NOTE — Progress Notes (Unsigned)
Cardiology Office Note:    Date:  09/23/2021   ID:  Emily Robertson, DOB 1930-12-25, MRN [id number]  PCP:  Rogers Blocker, FNP   [facility] Providers Cardiologist:  Olga Millers, MD     Referring MD: Rogers Blocker, FNP   Chief Complaint  Patient presents with   Follow-up    Seen for Dr. Jens Som    History of Present Illness:    Emily Robertson is a 88 y.o. female with a hx of hypertension, hyperlipidemia, rheumatoid arthritis who was recently presented to the hospital in June with right hip pain and had laceration after a fall.  CT showed acute impacted fracture of the right superior pubic rami and acute plastic fracture of the left anterior pubic ramus.  CT of the head was showed no acute finding.  Upon arrival, she also was noted to be in new atrial fibrillation with RVR with heart rate of 150s.  High-sensitivity troponin 260-->195.  She was placed on IV Cardizem drip.  She was already on Toprol-XL at home.  She did mention she had fallen 4-5 times in the past 6 months.  She was not initially started on anticoagulation therapy.  Echocardiogram obtained on 07/26/2021 showed EF 60 to 65%, no regional wall motion abnormality, severe LAE, moderate to severe MR, mild AI.  Patient later returned to the hospital in late July 2023 due to worsening dyspnea and orthopnea concerning for CHF exacerbation.  She was treated with IV Lasix.  Repeat echocardiogram obtained on 09/04/2021 showed EF 50 to 55%, moderate LVH, severe biatrial enlargement, moderate to severe MR with prolapse of the posterior leaflet.  Patient ultimately underwent TEE by Dr. Royann Shivers on 09/05/2021.  TEE revealed mitral valve with ruptured chordae to the P2 scallop of posterior leaflet, flail motion with relatively short segment of P2 scallop, mild MR, mild TR.  MitraClip work-up was recommended.  She was ultimately discharged on 40 mg daily of Lasix.  Due to elevation of LFT, Lipitor was discontinued.  Heart rate was  well controlled despite hyperthyroidism.  Cardioversion was not considered until her thyroid issue can be addressed.  Since discharge, patient has been evaluated by Dr. Lynnette Caffey for MitraClip on 09/18/2021 who felt her anatomy is very good for MitraClip therapy.  Patient presents today for follow-up.  According to family, she has not had any good appetite.  Her weight is stable though.  I recommended a CMP to check her renal function, electrolyte and liver enzyme.  If her liver enzymes are improved, we can consider reinitiating statins in the future.  She is taking medication for hypothyroidism, once her thyroid function improved, hopefully will see that her heart rate also settle down as well and we can consider outpatient cardioversion  Past Medical History:  Diagnosis Date   Arthritis    Glaucoma of both eyes    Hyperlipidemia    Hypertension    Pelvic prolapse    anterior floor   Urgency of urination    Urinary hesitancy    Urticaria     Past Surgical History:  Procedure Laterality Date   PUBOVAGINAL SLING N/A 08/31/2012   Procedure: BOSTON SCIENTIFIC UPHOLD LIGHT SACROSPINUS REPAIR;  Surgeon: Kathi Ludwig, MD;  Location: [facility];  Service: Urology;  Laterality: N/A;   RETINAL DETACHMENT SURGERY Right    TEE WITHOUT CARDIOVERSION N/A 09/05/2021   Procedure: TRANSESOPHAGEAL ECHOCARDIOGRAM (TEE);  Surgeon: Thurmon Fair, MD;  Location: [facility] ENDOSCOPY;  Service: Cardiovascular;  Laterality: N/A;    Current Medications: Current Meds  Medication Sig   acetaminophen (TYLENOL) 325 MG tablet Take 1 tablet (325 mg total) by mouth daily with supper.   alendronate (FOSAMAX) 70 MG tablet Take 70 mg by mouth every Wednesday.   apixaban (ELIQUIS) 2.5 MG TABS tablet Take 1 tablet (2.5 mg total) by mouth 2 (two) times daily.   Ascorbic Acid (VITAMIN C PO) Take 1 tablet by mouth every morning.   CALCIUM PO Take 1 tablet by mouth daily with supper.   cetirizine (ZYRTEC) 10  MG tablet Take 1 tablet (10 mg total) by mouth 2 (two) times daily.   Cholecalciferol (VITAMIN D3 PO) Take 1 capsule by mouth daily with supper.   diltiazem (CARDIZEM CD) 180 MG 24 hr capsule Take 1 capsule (180 mg total) by mouth daily.   EPINEPHrine (EPIPEN) 0.3 mg/0.3 mL IJ SOAJ injection Inject 0.3 mg into the muscle once as needed for anaphylaxis.   famotidine (PEPCID) 20 MG tablet Take 1 tablet (20 mg total) by mouth 2 (two) times daily.   feeding supplement (ENSURE ENLIVE / ENSURE PLUS) LIQD 237 ml between meals   furosemide (LASIX) 40 MG tablet Take 1 tablet (40 mg total) by mouth daily.   latanoprost (XALATAN) 0.005 % ophthalmic solution Place 1 drop into the left eye at bedtime.   leflunomide (ARAVA) 10 MG tablet Take 10 mg by mouth every morning.   methimazole (TAPAZOLE) 10 MG tablet Take 1 tablet (10 mg total) by mouth 2 (two) times daily.   metoprolol succinate (TOPROL-XL) 100 MG 24 hr tablet Take 1.5 tablets (150 mg total) by mouth every morning.   Misc Natural Products (OSTEO BI-FLEX JOINT SHIELD PO) Take 1 capsule by mouth daily with supper.   montelukast (SINGULAIR) 10 MG tablet Take 1 tablet (10 mg total) by mouth daily. (Patient taking differently: Take 10 mg by mouth every evening.)   Multiple Vitamin (MULTIVITAMIN WITH MINERALS) TABS tablet Take 1 tablet by mouth every morning.   Omega-3 Fatty Acids (FISH OIL) 1000 MG CAPS Take 1,000 mg by mouth every morning.   predniSONE (DELTASONE) 2.5 MG tablet Take 1 tablet (2.5 mg total) by mouth daily with breakfast.   Probiotic Product (PROBIOTIC DAILY PO) Take 1 capsule by mouth daily with supper.   timolol (TIMOPTIC) 0.5 % ophthalmic solution Place 1 drop into both eyes 2 (two) times daily.     Allergies:   Cozaar [losartan], Erythromycin, and Azopt [brinzolamide]   Social History   Socioeconomic History   Marital status: Widowed    Spouse name: Not on file   Number of children: Not on file   Years of education: Not on file    Highest education level: Not on file  Occupational History   Not on file  Tobacco Use   Smoking status: Never   Smokeless tobacco: Never  Substance and Sexual Activity   Alcohol use: Yes    Comment: rare   Drug use: No   Sexual activity: Not on file  Other Topics Concern   Not on file  Social History Narrative   Not on file   Social Determinants of Health   Financial Resource Strain: Not on file  Food Insecurity: Not on file  Transportation Needs: Not on file  Physical Activity: Not on file  Stress: Not on file  Social Connections: Not on file     Family History: The patient's family history includes Hypertension in her mother. There is no history of Thyroid  disease or Diabetes.  ROS:   Please see the history of present illness.     All other systems reviewed and are negative.  EKGs/Labs/Other Studies Reviewed:    The following studies were reviewed today:  TEE 09/05/2021 1. Left ventricular ejection fraction, by estimation, is 65 to 70%. The  left ventricle has normal function. The left ventricle has no regional  wall motion abnormalities. Left ventricular diastolic function could not  be evaluated.   2. Right ventricular systolic function is normal. The right ventricular  size is normal.   3. Left atrial size was severely dilated. No left atrial/left atrial  appendage thrombus was detected. The LAA emptying velocity was 27 cm/s.   4. Right atrial size was mildly dilated.   5. A single ruptured chorda tendina is seen, attached to the mid portion  of P2 (middle) scallop of the posterior leaflet. There is a fairly short  arc of the P2 scallop with flail motion. Flail gap 5 mm, flail width 9 mm.  Posterior leaflet length 15 mm,  valve area 6 cm sq, mean gradient 2 mm Hg at 111 bpm. MR 2D vena contracta  is 5 mm. 3D guided effective regurgitant orifice area measures 0.34 cm sq,  regurgitant volume 45 mL. There is blunted, but antegrade systolic flow in  the right  and left pulmonary  veins. Due to atrial fibrillation, unable to accurately assess via PISA  method. The mitral valve is myxomatous. Moderate to severe mitral valve  regurgitation. No evidence of mitral stenosis.   6. The tricuspid valve is myxomatous.   7. The aortic valve is tricuspid. Aortic valve regurgitation is mild.   8. There is mild (Grade II) plaque involving the ascending aorta.   EKG:  EKG is ordered today.  The ekg ordered today demonstrates atrial fibrillation, heart rate 88 bpm  Recent Labs: 07/30/2021: Magnesium 2.1 09/03/2021: B Natriuretic Peptide 1,182.0 09/05/2021: TSH 1.062 09/06/2021: Hemoglobin 13.4; Platelets 270 09/21/2021: ALT 116; BUN 26; Creatinine, Ser 0.98; Potassium 4.6; Sodium 134  Recent Lipid Panel    Component Value Date/Time   CHOL 144 07/26/2021 0923   TRIG 52 07/26/2021 0923   HDL 52 07/26/2021 0923   CHOLHDL 2.8 07/26/2021 0923   VLDL 10 07/26/2021 0923   LDLCALC 82 07/26/2021 0923     Risk Assessment/Calculations:    CHA2DS2-VASc Score = 6   This indicates a 9.7% annual risk of stroke. The patient's score is based upon: CHF History: 1 HTN History: 1 Diabetes History: 0 Stroke History: 0 Vascular Disease History: 1 Age Score: 2 Gender Score: 1          Physical Exam:    VS:  BP (!) 140/82 (BP Location: Left Arm, Patient Position: Sitting, Cuff Size: Small)   Pulse 88   Ht 5\' 2"  (1.575 m)   Wt 102 lb 6.4 oz (46.4 kg)   SpO2 92%   BMI 18.73 kg/m        Wt Readings from Last 3 Encounters:  09/21/21 102 lb 6.4 oz (46.4 kg)  09/18/21 102 lb 3.2 oz (46.4 kg)  09/06/21 107 lb 5.8 oz (48.7 kg)     GEN:  Well nourished, well developed in no acute distress HEENT: Normal NECK: No JVD; No carotid bruits LYMPHATICS: No lymphadenopathy CARDIAC: Irregularly irregular, no murmurs, rubs, gallops RESPIRATORY:  Clear to auscultation without rales, wheezing or rhonchi  ABDOMEN: Soft, non-tender, non-distended MUSCULOSKELETAL:  No  edema; No deformity  SKIN: Warm and dry NEUROLOGIC:  Alert and oriented x 3 PSYCHIATRIC:  Normal affect   ASSESSMENT:    1. Persistent atrial fibrillation (HCC)   2. Elevated transaminase level   3. Primary hypertension   4. Hyperlipidemia LDL goal <100   5. Nonrheumatic mitral valve regurgitation   6. Hyperthyroidism    PLAN:    In order of problems listed above:  Persistent atrial fibrillation: Patient was initially diagnosed with atrial fibrillation in June after fall that resulted in pelvic fracture.  She is on Eliquis.  She presented back to the hospital again on 09/03/2021 with heart failure and A-fib with RVR.  Focus was placed on rate control only due to underlying hyperthyroidism currently under treatment.  Once her thyroid is under better control, may consider cardioversion in the future  Elevated transaminase: Obtain CMP  Hypertension: Blood pressure stable  Hyperlipidemia: Off of statin medication due to elevated transaminase.  Obtain CMP  Moderate to severe mitral valve regurgitation: Followed by Dr. Lynnette Caffey, undergoing evaluation for MitraClip  Hypothyroidism: Currently under treatment.           Medication Adjustments/Labs and Tests Ordered: Current medicines are reviewed at length with the patient today.  Concerns regarding medicines are outlined above.  Orders Placed This Encounter  Procedures   Comprehensive metabolic panel   EKG 12-Lead   No orders of the defined types were placed in this encounter.   Patient Instructions  Medication Instructions:  Your physician recommends that you continue on your current medications as directed. Please refer to the Current Medication list given to you today.  *If you need a refill on your cardiac medications before your next appointment, please call your pharmacy*  Lab Work: Your physician recommends that you return for lab work TODAY:  CMP If you have labs (blood work) drawn today and your tests are  completely normal, you will receive your results only by: MyChart Message (if you have MyChart) OR A paper copy in the mail If you have any lab test that is abnormal or we need to change your treatment, we will call you to review the results.  Testing/Procedures: NONE ordered at this time of appointment   Follow-Up: At [facility], you and your health needs are our priority.  As part of our continuing mission to provide you with exceptional heart care, we have created designated Provider Care Teams.  These Care Teams include your primary Cardiologist (physician) and Advanced Practice Providers (APPs -  Physician Assistants and Nurse Practitioners) who all work together to provide you with the care you need, when you need it.  Your next appointment:   2 month(s)  The format for your next appointment:   In Person  Provider:   Azalee Course, PA-C on day Dr. Jens Som is in office    Other Instructions  Important Information About Sugar         Ramond Dial, [place]  09/23/2021 11:28 PM    [facility]

## 2021-09-21 NOTE — Patient Instructions (Signed)
Medication Instructions:  Your physician recommends that you continue on your current medications as directed. Please refer to the Current Medication list given to you today.  *If you need a refill on your cardiac medications before your next appointment, please call your pharmacy*  Lab Work: Your physician recommends that you return for lab work TODAY:  CMP If you have labs (blood work) drawn today and your tests are completely normal, you will receive your results only by: MyChart Message (if you have MyChart) OR A paper copy in the mail If you have any lab test that is abnormal or we need to change your treatment, we will call you to review the results.  Testing/Procedures: NONE ordered at this time of appointment   Follow-Up: At [facility], you and your health needs are our priority.  As part of our continuing mission to provide you with exceptional heart care, we have created designated Provider Care Teams.  These Care Teams include your primary Cardiologist (physician) and Advanced Practice Providers (APPs -  Physician Assistants and Nurse Practitioners) who all work together to provide you with the care you need, when you need it.  Your next appointment:   2 month(s)  The format for your next appointment:   In Person  Provider:   Azalee Course, PA-C on day Dr. Jens Som is in office    Other Instructions  Important Information About Sugar

## 2021-09-22 LAB — COMPREHENSIVE METABOLIC PANEL
ALT: 116 IU/L — ABNORMAL HIGH (ref 0–32)
AST: 72 IU/L — ABNORMAL HIGH (ref 0–40)
Albumin/Globulin Ratio: 1.8 (ref 1.2–2.2)
Albumin: 4.4 g/dL (ref 3.7–4.7)
Alkaline Phosphatase: 857 IU/L — ABNORMAL HIGH (ref 44–121)
BUN/Creatinine Ratio: 27 (ref 12–28)
BUN: 26 mg/dL (ref 8–27)
Bilirubin Total: 0.9 mg/dL (ref 0.0–1.2)
CO2: 19 mmol/L — ABNORMAL LOW (ref 20–29)
Calcium: 9.7 mg/dL (ref 8.7–10.3)
Chloride: 92 mmol/L — ABNORMAL LOW (ref 96–106)
Creatinine, Ser: 0.98 mg/dL (ref 0.57–1.00)
Globulin, Total: 2.4 g/dL (ref 1.5–4.5)
Glucose: 106 mg/dL — ABNORMAL HIGH (ref 70–99)
Potassium: 4.6 mmol/L (ref 3.5–5.2)
Sodium: 134 mmol/L (ref 134–144)
Total Protein: 6.8 g/dL (ref 6.0–8.5)
eGFR: 55 mL/min/{1.73_m2} — ABNORMAL LOW (ref >59)

## 2021-09-23 ENCOUNTER — Encounter: Payer: Self-pay | Admitting: Physician Assistant

## 2021-10-03 DIAGNOSIS — Z681 Body mass index (BMI) 19 or less, adult: Secondary | ICD-10-CM | POA: Diagnosis not present

## 2021-10-03 DIAGNOSIS — M0609 Rheumatoid arthritis without rheumatoid factor, multiple sites: Secondary | ICD-10-CM | POA: Diagnosis not present

## 2021-10-03 DIAGNOSIS — M79641 Pain in right hand: Secondary | ICD-10-CM | POA: Diagnosis not present

## 2021-10-03 DIAGNOSIS — M79642 Pain in left hand: Secondary | ICD-10-CM | POA: Diagnosis not present

## 2021-10-03 DIAGNOSIS — M1991 Primary osteoarthritis, unspecified site: Secondary | ICD-10-CM | POA: Diagnosis not present

## 2021-10-09 ENCOUNTER — Encounter: Payer: Self-pay | Admitting: Emergency Medicine

## 2021-10-09 ENCOUNTER — Ambulatory Visit (INDEPENDENT_AMBULATORY_CARE_PROVIDER_SITE_OTHER): Payer: Medicare PPO | Admitting: Emergency Medicine

## 2021-10-09 DIAGNOSIS — R911 Solitary pulmonary nodule: Secondary | ICD-10-CM | POA: Diagnosis not present

## 2021-10-09 NOTE — Progress Notes (Signed)
Subjective:    Patient ID: Emily Robertson, female    DOB: 06-13-1930, 88 y.o.   MRN: [id number]  HPI 88 year old never smoker with a history of atrial fibrillation, hypertension and hyperlipidemia, recent pelvic fracture, new dx hyperthyroidism.  Admitted in July for dyspnea, found to be in A-fib with RVR.  She underwent a TEE during that hospitalization that showed intact LV function, normal RV size and function, dilated left atrium, mitral regurgitation with ruptured chordae tendina.  She also had a CT scan of the chest as below. Her hyperthyroidism is being treated. She underwent diuresis, did improve.   CT chest 09/03/2021 reviewed by me shows stable to moderate layering bilateral pleural effusions, biapical pleural-parenchymal scarring.  There is a 2.1 x 1.8 cm groundglass right upper lobe pulmonary nodule that has enlarged in size compared with 01/2021.   Review of Systems As per HPI  Past Medical History:  Diagnosis Date   Arthritis    Glaucoma of both eyes    Hyperlipidemia    Hypertension    Pelvic prolapse    anterior floor   Urgency of urination    Urinary hesitancy    Urticaria      Family History  Problem Relation Age of Onset   Hypertension Mother    Thyroid disease Neg Hx    Diabetes Neg Hx      Social History   Socioeconomic History   Marital status: Widowed    Spouse name: Not on file   Number of children: Not on file   Years of education: Not on file   Highest education level: Not on file  Occupational History   Not on file  Tobacco Use   Smoking status: Never   Smokeless tobacco: Never  Substance and Sexual Activity   Alcohol use: Yes    Comment: rare   Drug use: No   Sexual activity: Not on file  Other Topics Concern   Not on file  Social History Narrative   Not on file   Social Determinants of Health   Financial Resource Strain: Not on file  Food Insecurity: Not on file  Transportation Needs: Not on file  Physical Activity: Not on  file  Stress: Not on file  Social Connections: Not on file  Intimate Partner Violence: Not on file  She worked at [employer], no occupational or other inhaled exposures Lived in [place], [place], [place], [place]  Allergies  Allergen Reactions   Cozaar [Losartan] Swelling    Tongue swelling   Erythromycin Itching   Azopt [Brinzolamide] Rash    Rash on eyelid     Outpatient Medications Prior to Visit  Medication Sig Dispense Refill   acetaminophen (TYLENOL) 325 MG tablet Take 1 tablet (325 mg total) by mouth daily with supper.     alendronate (FOSAMAX) 70 MG tablet Take 70 mg by mouth every Wednesday.     apixaban (ELIQUIS) 2.5 MG TABS tablet Take 1 tablet (2.5 mg total) by mouth 2 (two) times daily. 60 tablet 4   Ascorbic Acid (VITAMIN C PO) Take 1 tablet by mouth every morning.     CALCIUM PO Take 1 tablet by mouth daily with supper.     cetirizine (ZYRTEC) 10 MG tablet Take 1 tablet (10 mg total) by mouth 2 (two) times daily. 60 tablet 3   Cholecalciferol (VITAMIN D3 PO) Take 1 capsule by mouth daily with supper.     diltiazem (CARDIZEM CD) 180 MG 24 hr capsule Take 1  capsule (180 mg total) by mouth daily. 30 capsule 4   EPINEPHrine (EPIPEN) 0.3 mg/0.3 mL IJ SOAJ injection Inject 0.3 mg into the muscle once as needed for anaphylaxis.     famotidine (PEPCID) 20 MG tablet Take 1 tablet (20 mg total) by mouth 2 (two) times daily. 60 tablet 3   feeding supplement (ENSURE ENLIVE / ENSURE PLUS) LIQD 237 ml between meals     furosemide (LASIX) 40 MG tablet Take 1 tablet (40 mg total) by mouth daily. 30 tablet 1   latanoprost (XALATAN) 0.005 % ophthalmic solution Place 1 drop into the left eye at bedtime.     leflunomide (ARAVA) 10 MG tablet Take 10 mg by mouth every morning.     methimazole (TAPAZOLE) 10 MG tablet Take 1 tablet (10 mg total) by mouth 2 (two) times daily. 60 tablet 4   metoprolol succinate (TOPROL-XL) 100 MG 24 hr tablet Take 1.5 tablets (150 mg total) by mouth  every morning. 45 tablet 1   Misc Natural Products (OSTEO BI-FLEX JOINT SHIELD PO) Take 1 capsule by mouth daily with supper.     montelukast (SINGULAIR) 10 MG tablet Take 1 tablet (10 mg total) by mouth daily. (Patient taking differently: Take 10 mg by mouth every evening.) 90 tablet 1   Multiple Vitamin (MULTIVITAMIN WITH MINERALS) TABS tablet Take 1 tablet by mouth every morning.     Omega-3 Fatty Acids (FISH OIL) 1000 MG CAPS Take 1,000 mg by mouth every morning.     predniSONE (DELTASONE) 2.5 MG tablet Take 1 tablet (2.5 mg total) by mouth daily with breakfast. 30 tablet 1   Probiotic Product (PROBIOTIC DAILY PO) Take 1 capsule by mouth daily with supper.     timolol (TIMOPTIC) 0.5 % ophthalmic solution Place 1 drop into both eyes 2 (two) times daily.     No facility-administered medications prior to visit.         Objective:   Physical Exam Vitals:   10/09/21 1401  BP: 116/68  Temp: 98.4 F (36.9 C)  TempSrc: Oral  Weight: 101 lb 6.4 oz (46 kg)  Height:  (1.575 m)   Gen: Pleasant, thin elderly woman, in no distress,  normal affect  ENT: No lesions,  mouth clear,  oropharynx clear, no postnasal drip  Neck: No JVD, no stridor  Lungs: No use of accessory muscles, no crackles or wheezing on normal respiration, no wheeze on forced expiration  Cardiovascular: distant, irregular  Musculoskeletal: No deformities, no cyanosis or clubbing  Neuro: alert, awake, non focal  Skin: Warm, no lesions or rash      Assessment & Plan:  Lung nodule Right upper lobe groundglass pulmonary nodule that has enlarged compared with December 2022.  May be consistent with highly differentiated adenocarcinoma in a never smoker.  Given her age, other medical issues I think it is reasonable to be conservative here.  We will plan to defer bronchoscopy for now, repeat her CT chest in 6 months to look for interval stability or change.  She may not be a candidate for bronchoscopy at any point  depending on overall clinical status.  If our suspicion for malignancy is high and she cannot safely have a tissue diagnosis we could consider referral for empiric SBRT.  I will follow CT with them to guide next steps  We reviewed your CT scan of the chest today. We will plan to repeat your CT chest without contrast in April 2024 to follow small pulmonary nodule. Please follow  with Dr. Delton Coombes in April after your CT so we can review the results together.   Levy Pupa, MD, PhD 10/09/2021, 2:24 PM [facility] [phone] or if no answer before 7:00PM call [phone] For any issues after 7:00PM please call eLink [phone]

## 2021-10-09 NOTE — Assessment & Plan Note (Signed)
Right upper lobe groundglass pulmonary nodule that has enlarged compared with December 2022.  May be consistent with highly differentiated adenocarcinoma in a never smoker.  Given her age, other medical issues I think it is reasonable to be conservative here.  We will plan to defer bronchoscopy for now, repeat her CT chest in 6 months to look for interval stability or change.  She may not be a candidate for bronchoscopy at any point depending on overall clinical status.  If our suspicion for malignancy is high and she cannot safely have a tissue diagnosis we could consider referral for empiric SBRT.  I will follow CT with them to guide next steps  We reviewed your CT scan of the chest today. We will plan to repeat your CT chest without contrast in April 2024 to follow small pulmonary nodule. Please follow with Dr. Delton Coombes in April after your CT so we can review the results together.

## 2021-10-09 NOTE — Addendum Note (Signed)
Addended by: Christen Butter on: 10/09/2021 02:31 PM   Modules accepted: Orders

## 2021-10-09 NOTE — Patient Instructions (Signed)
We reviewed your CT scan of the chest today. We will plan to repeat your CT chest without contrast in April 2024 to follow small pulmonary nodule. Please follow with Dr. Delton Coombes in April after your CT so we can review the results together.

## 2021-10-11 DIAGNOSIS — I700 Atherosclerosis of aorta: Secondary | ICD-10-CM | POA: Diagnosis not present

## 2021-10-11 DIAGNOSIS — R748 Abnormal levels of other serum enzymes: Secondary | ICD-10-CM | POA: Diagnosis not present

## 2021-10-11 DIAGNOSIS — M810 Age-related osteoporosis without current pathological fracture: Secondary | ICD-10-CM | POA: Diagnosis not present

## 2021-10-11 DIAGNOSIS — I5033 Acute on chronic diastolic (congestive) heart failure: Secondary | ICD-10-CM | POA: Diagnosis not present

## 2021-10-11 DIAGNOSIS — S3282XD Multiple fractures of pelvis without disruption of pelvic ring, subsequent encounter for fracture with routine healing: Secondary | ICD-10-CM | POA: Diagnosis not present

## 2021-10-11 DIAGNOSIS — E0590 Thyrotoxicosis, unspecified without thyrotoxic crisis or storm: Secondary | ICD-10-CM | POA: Diagnosis not present

## 2021-10-11 DIAGNOSIS — I4891 Unspecified atrial fibrillation: Secondary | ICD-10-CM | POA: Diagnosis not present

## 2021-10-11 DIAGNOSIS — E7800 Pure hypercholesterolemia, unspecified: Secondary | ICD-10-CM | POA: Diagnosis not present

## 2021-10-22 DIAGNOSIS — H401122 Primary open-angle glaucoma, left eye, moderate stage: Secondary | ICD-10-CM | POA: Diagnosis not present

## 2021-10-22 DIAGNOSIS — H401113 Primary open-angle glaucoma, right eye, severe stage: Secondary | ICD-10-CM | POA: Diagnosis not present

## 2021-11-10 IMAGING — CR DG RIBS 2V*L*
2 series · 2 of 2 positions shown · non-contrast
Comparison: None.

CLINICAL DATA: Midline low back pain after fall.

EXAM:
LEFT RIBS - 2 VIEW

[w ribs ap/pa upper left *]
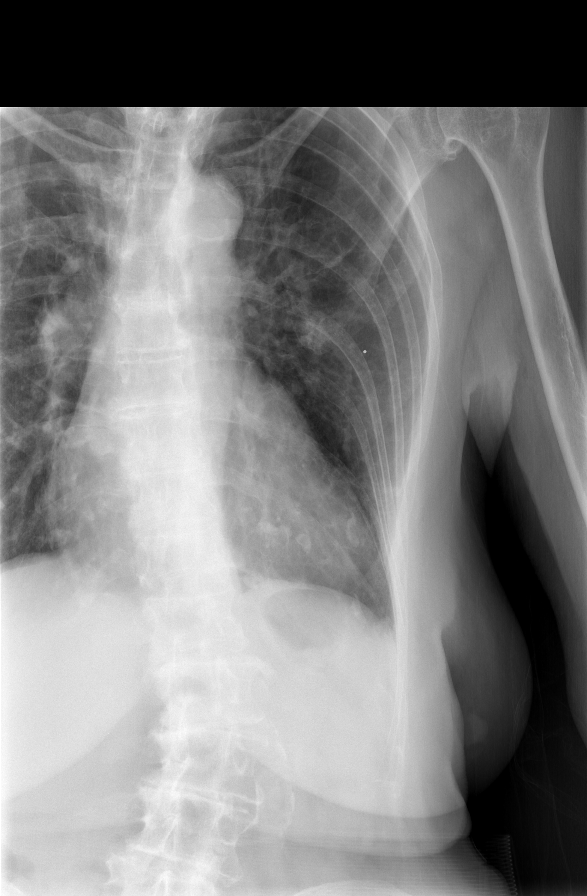

[w ribs oblique left *]
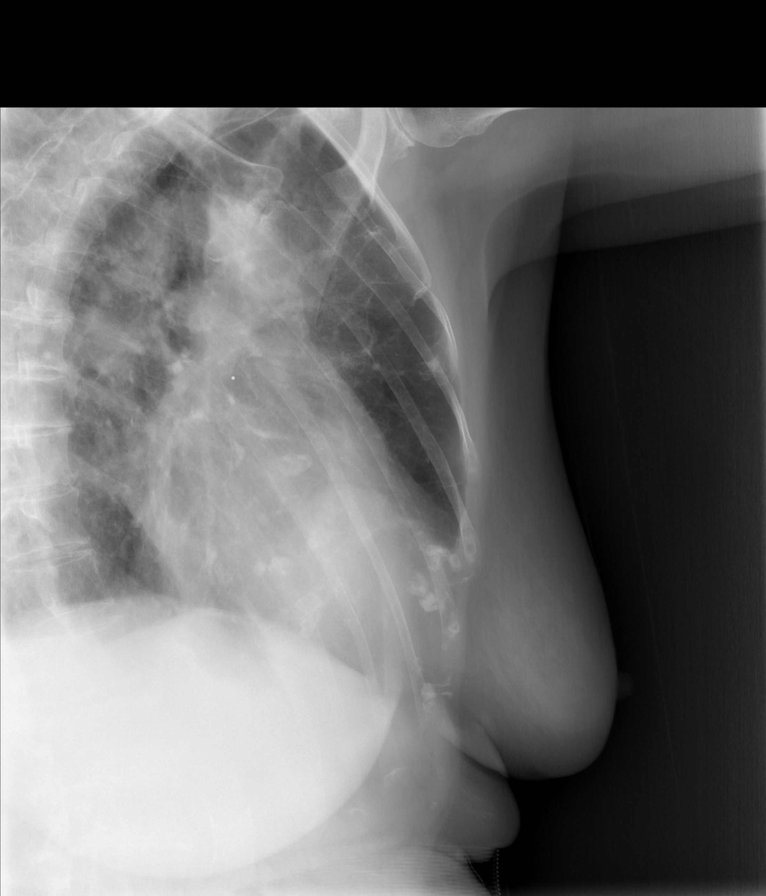

[2 of 2 positions shown; findings below may reference images not displayed]

FINDINGS: No fracture or other bone lesions are seen involving the ribs.
IMPRESSION: Negative.

## 2021-11-10 IMAGING — CR DG CHEST 2V
2 series · 2 of 2 positions shown · non-contrast
Comparison: November 30, 2012.

CLINICAL DATA: Low back pain after fall.

EXAM:
CHEST - 2 VIEW

[w chest pa]
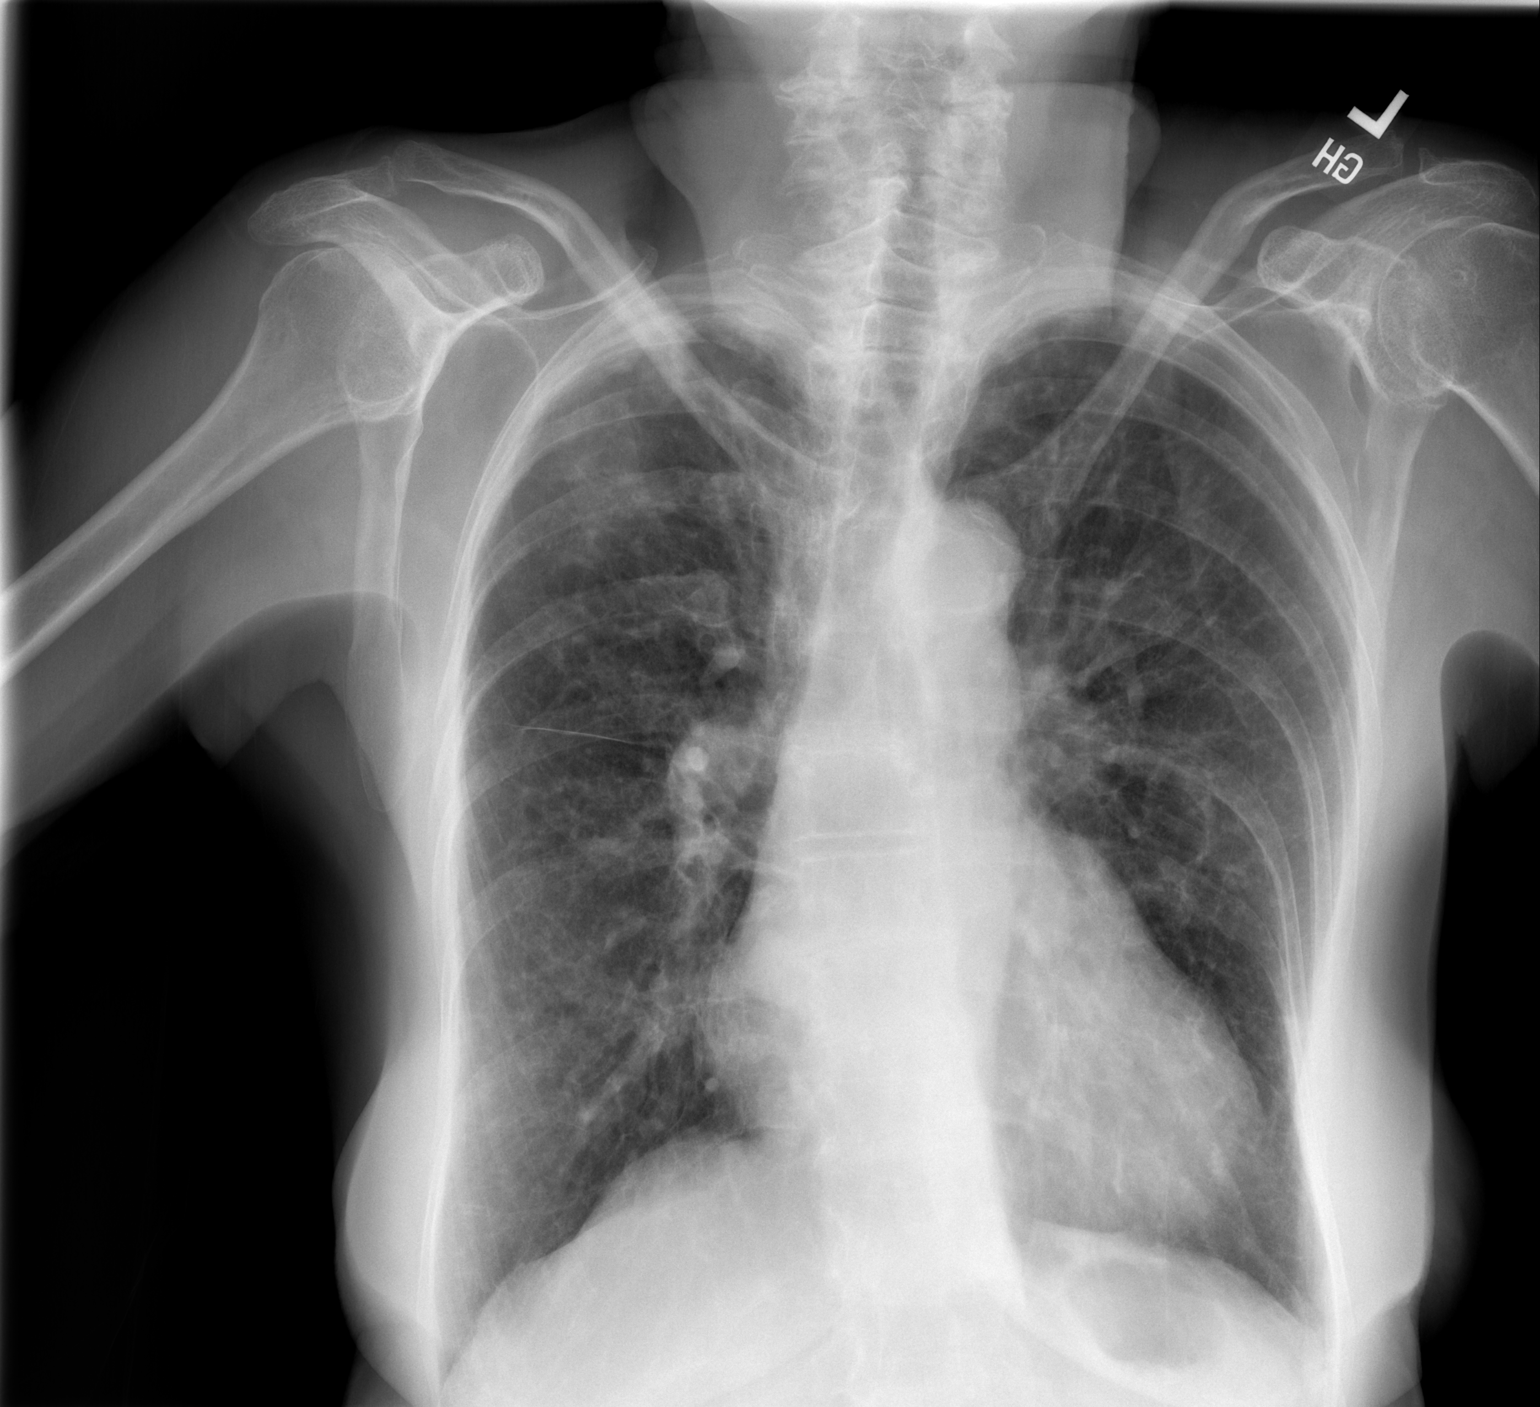

[w chest lat]
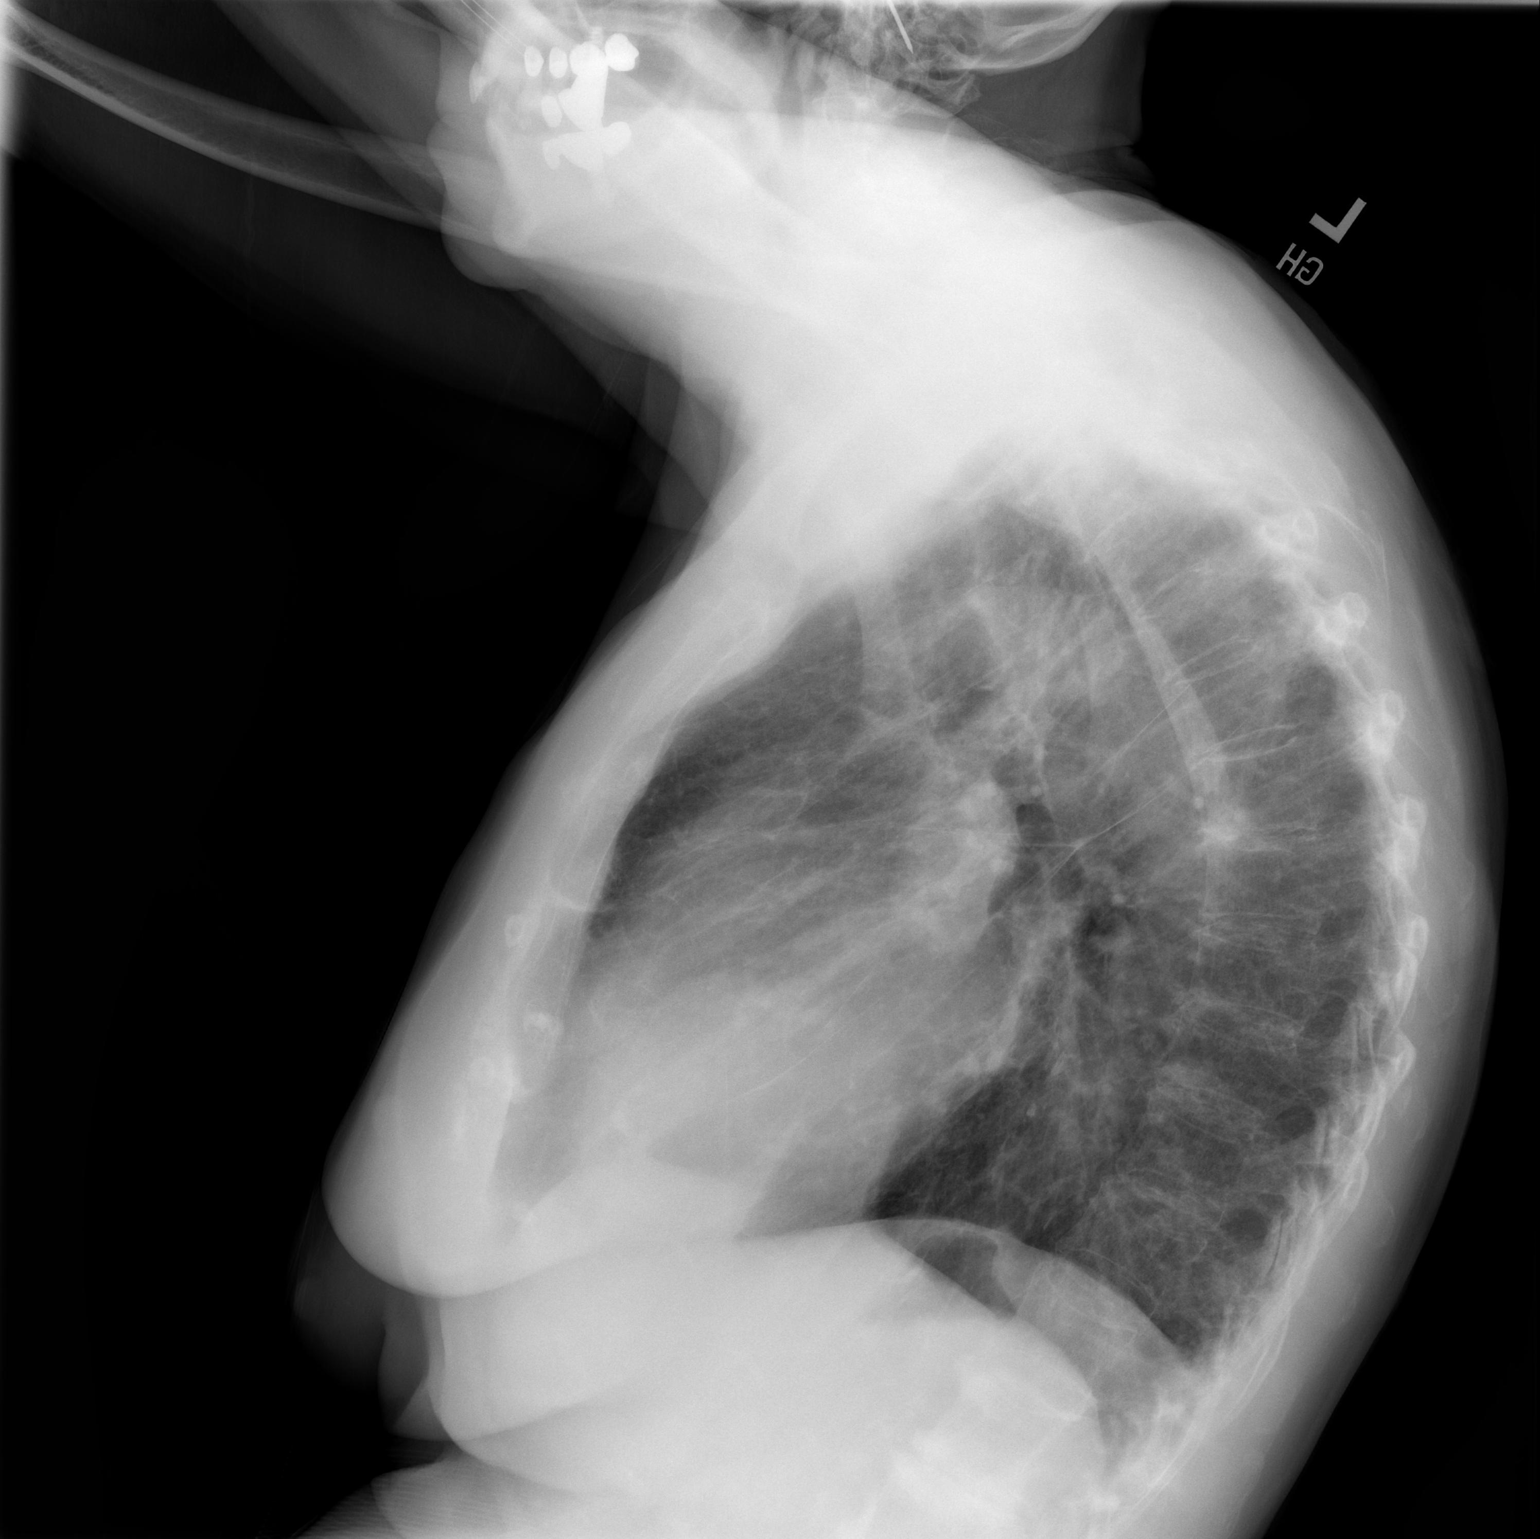

[2 of 2 positions shown; findings below may reference images not displayed]

FINDINGS: Stable cardiomediastinal silhouette. No pneumothorax or pleural
effusion is noted. Bony thorax is grossly unremarkable. Left lung is
clear. Ill-defined right upper lobe opacity is noted which may
represent infiltrate, but mass cannot be excluded.
IMPRESSION: Ill-defined right upper lobe opacity is noted which may represent
infiltrate, but mass cannot be excluded. CT scan of the chest is
recommended for further evaluation.

## 2021-12-05 DIAGNOSIS — Z681 Body mass index (BMI) 19 or less, adult: Secondary | ICD-10-CM | POA: Diagnosis not present

## 2021-12-05 DIAGNOSIS — I4891 Unspecified atrial fibrillation: Secondary | ICD-10-CM | POA: Diagnosis not present

## 2021-12-05 DIAGNOSIS — R748 Abnormal levels of other serum enzymes: Secondary | ICD-10-CM | POA: Diagnosis not present

## 2021-12-05 DIAGNOSIS — R5383 Other fatigue: Secondary | ICD-10-CM | POA: Diagnosis not present

## 2021-12-05 DIAGNOSIS — E0590 Thyrotoxicosis, unspecified without thyrotoxic crisis or storm: Secondary | ICD-10-CM | POA: Diagnosis not present

## 2021-12-05 DIAGNOSIS — M810 Age-related osteoporosis without current pathological fracture: Secondary | ICD-10-CM | POA: Diagnosis not present

## 2021-12-05 DIAGNOSIS — F5101 Primary insomnia: Secondary | ICD-10-CM | POA: Diagnosis not present

## 2021-12-10 DIAGNOSIS — Z681 Body mass index (BMI) 19 or less, adult: Secondary | ICD-10-CM | POA: Diagnosis not present

## 2021-12-10 DIAGNOSIS — I10 Essential (primary) hypertension: Secondary | ICD-10-CM | POA: Diagnosis not present

## 2021-12-10 DIAGNOSIS — M810 Age-related osteoporosis without current pathological fracture: Secondary | ICD-10-CM | POA: Diagnosis not present

## 2021-12-10 DIAGNOSIS — I4891 Unspecified atrial fibrillation: Secondary | ICD-10-CM | POA: Diagnosis not present

## 2021-12-10 DIAGNOSIS — R300 Dysuria: Secondary | ICD-10-CM | POA: Diagnosis not present

## 2021-12-10 DIAGNOSIS — R351 Nocturia: Secondary | ICD-10-CM | POA: Diagnosis not present

## 2021-12-10 DIAGNOSIS — R748 Abnormal levels of other serum enzymes: Secondary | ICD-10-CM | POA: Diagnosis not present

## 2021-12-10 DIAGNOSIS — E0590 Thyrotoxicosis, unspecified without thyrotoxic crisis or storm: Secondary | ICD-10-CM | POA: Diagnosis not present

## 2021-12-11 DIAGNOSIS — B351 Tinea unguium: Secondary | ICD-10-CM | POA: Diagnosis not present

## 2021-12-11 DIAGNOSIS — M2041 Other hammer toe(s) (acquired), right foot: Secondary | ICD-10-CM | POA: Diagnosis not present

## 2021-12-11 DIAGNOSIS — M2042 Other hammer toe(s) (acquired), left foot: Secondary | ICD-10-CM | POA: Diagnosis not present

## 2021-12-11 DIAGNOSIS — M79672 Pain in left foot: Secondary | ICD-10-CM | POA: Diagnosis not present

## 2021-12-11 DIAGNOSIS — M79671 Pain in right foot: Secondary | ICD-10-CM | POA: Diagnosis not present

## 2021-12-17 ENCOUNTER — Encounter: Payer: Self-pay | Admitting: Physician Assistant

## 2021-12-17 ENCOUNTER — Ambulatory Visit: Payer: Medicare PPO | Attending: Physician Assistant | Admitting: Physician Assistant

## 2021-12-17 VITALS — BP 112/62 | HR 79 | Ht 63.0 in | Wt 101.0 lb

## 2021-12-17 DIAGNOSIS — Z79899 Other long term (current) drug therapy: Secondary | ICD-10-CM

## 2021-12-17 DIAGNOSIS — I4819 Other persistent atrial fibrillation: Secondary | ICD-10-CM | POA: Diagnosis not present

## 2021-12-17 DIAGNOSIS — I340 Nonrheumatic mitral (valve) insufficiency: Secondary | ICD-10-CM

## 2021-12-17 NOTE — Progress Notes (Signed)
Cardiology Office Note:    Date:  12/19/2021   ID:  Emily Robertson, DOB January 31, 1931, MRN [id number]  PCP:  [name], [facility] Cardiologist:  Kirk Ruths, MD     Referring MD: [name], FNP   Chief Complaint  Patient presents with   Follow-up    2 months.   Fatigue    All the time.    History of Present Illness:    Emily Robertson is a 88 y.o. female with a hx of HTN, HLD, rheumatoid arthritis who was recently presented to the hospital in June with right hip pain and had laceration after a fall.  CT showed acute impacted fracture of the right superior pubic rami and acute plastic fracture of the left anterior pubic ramus.  CT of the head was showed no acute finding.  Upon arrival, she also was noted to be in new atrial fibrillation with RVR with heart rate of 150s.  High-sensitivity troponin 260-->195.  She was placed on IV Cardizem drip.  She was already on Toprol-XL at home.  She did mention she had fallen 4-5 times in the past 6 months.  She was not initially started on anticoagulation therapy.  Echocardiogram obtained on 07/26/2021 showed EF 60 to 65%, no regional wall motion abnormality, severe LAE, moderate to severe MR, mild AI.  Patient later returned to the hospital in late July 2023 due to worsening dyspnea and orthopnea concerning for CHF exacerbation.  She was treated with IV Lasix.  Repeat echocardiogram obtained on 09/04/2021 showed EF 50 to 55%, moderate LVH, severe biatrial enlargement, moderate to severe MR with prolapse of the posterior leaflet.  Patient ultimately underwent TEE by Dr. Sallyanne Kuster on 09/05/2021.  TEE revealed mitral valve with ruptured chordae to the P2 scallop of posterior leaflet, flail motion with relatively short segment of P2 scallop, mild MR, mild TR.  MitraClip work-up was recommended.  She was ultimately discharged on 40 mg daily of Lasix.  Due to elevation of LFT, Lipitor was discontinued.  Heart rate was  well controlled despite hyperthyroidism.  Cardioversion was not considered until her thyroid issue can be addressed.  Since discharge, patient has been evaluated by Dr. Ali Lowe for MitraClip on 09/18/2021 who felt her anatomy is very good for MitraClip therapy.   I last saw the patient in August 2023, her weight was stable.  Her liver enzyme was improving.  Patient presents today for follow-up.  She has fairly poor functional ability.  She remains in atrial fibrillation based on today's EKG.  She has no cardiac awareness of atrial fibrillation.  She has been compliant with rate control and anticoagulation therapy.  I am not confident bring her out of atrial fibrillation is going to change her overall condition or improve her quality of life even further.  After discussing with both the patient and the Dr. Stanford Breed, we eventually decided to continue with rate control strategy.  As far as her mitral regurgitation, she is hesitant to proceed with MitraClip procedure and wish to still think about it.  She is aware that without surgery, she will likely continue to have issue associated with severe valve regurgitation such as heart failure, worsening shortness of breath and lower extremity edema.  She already has some shortness of breath with exertion at baseline given deconditioning and her advanced age.  Past Medical History:  Diagnosis Date   Arthritis    Glaucoma of both eyes    Hyperlipidemia  Hypertension    Pelvic prolapse    anterior floor   Urgency of urination    Urinary hesitancy    Urticaria     Past Surgical History:  Procedure Laterality Date   PUBOVAGINAL SLING N/A 08/31/2012   Procedure: BOSTON SCIENTIFIC UPHOLD LIGHT SACROSPINUS REPAIR;  Surgeon: Kathi Ludwig, MD;  [facility];  Service: Urology;  Laterality: N/A;   RETINAL DETACHMENT SURGERY Right    TEE WITHOUT CARDIOVERSION N/A 09/05/2021   Procedure: TRANSESOPHAGEAL ECHOCARDIOGRAM (TEE);  Surgeon:  Thurmon Fair, MD;  Location: [facility];  Service: Cardiovascular;  Laterality: N/A;    Current Medications: Current Meds  Medication Sig   acetaminophen (TYLENOL) 325 MG tablet Take 1 tablet (325 mg total) by mouth daily with supper.   alendronate (FOSAMAX) 70 MG tablet Take 70 mg by mouth every Wednesday.   apixaban (ELIQUIS) 2.5 MG TABS tablet Take 1 tablet (2.5 mg total) by mouth 2 (two) times daily.   Ascorbic Acid (VITAMIN C PO) Take 1 tablet by mouth every morning.   CALCIUM PO Take 1 tablet by mouth daily with supper.   cetirizine (ZYRTEC) 10 MG tablet Take 1 tablet (10 mg total) by mouth 2 (two) times daily.   Cholecalciferol (VITAMIN D3 PO) Take 1 capsule by mouth daily with supper.   diltiazem (CARDIZEM CD) 180 MG 24 hr capsule Take 1 capsule (180 mg total) by mouth daily.   EPINEPHrine (EPIPEN) 0.3 mg/0.3 mL IJ SOAJ injection Inject 0.3 mg into the muscle once as needed for anaphylaxis.   famotidine (PEPCID) 20 MG tablet Take 1 tablet (20 mg total) by mouth 2 (two) times daily.   feeding supplement (ENSURE ENLIVE / ENSURE PLUS) LIQD 237 ml between meals   furosemide (LASIX) 40 MG tablet Take 1 tablet (40 mg total) by mouth daily.   latanoprost (XALATAN) 0.005 % ophthalmic solution Place 1 drop into the left eye at bedtime.   leflunomide (ARAVA) 10 MG tablet Take 10 mg by mouth every morning.   methimazole (TAPAZOLE) 10 MG tablet Take 1 tablet (10 mg total) by mouth 2 (two) times daily.   metoprolol succinate (TOPROL-XL) 100 MG 24 hr tablet Take 1.5 tablets (150 mg total) by mouth every morning.   Misc Natural Products (OSTEO BI-FLEX JOINT SHIELD PO) Take 1 capsule by mouth daily with supper.   montelukast (SINGULAIR) 10 MG tablet Take 1 tablet (10 mg total) by mouth daily. (Patient taking differently: Take 10 mg by mouth every evening.)   Multiple Vitamin (MULTIVITAMIN WITH MINERALS) TABS tablet Take 1 tablet by mouth every morning.   Omega-3 Fatty Acids (FISH OIL) 1000 MG  CAPS Take 1,000 mg by mouth every morning.   predniSONE (DELTASONE) 2.5 MG tablet Take 1 tablet (2.5 mg total) by mouth daily with breakfast.   Probiotic Product (PROBIOTIC DAILY PO) Take 1 capsule by mouth daily with supper.   timolol (TIMOPTIC) 0.5 % ophthalmic solution Place 1 drop into both eyes 2 (two) times daily.     Allergies:   Cozaar [losartan], Erythromycin, and Azopt [brinzolamide]   Social History   Socioeconomic History   Marital status: Widowed    Spouse name: Not on file   Number of children: Not on file   Years of education: Not on file   Highest education level: Not on file  Occupational History   Not on file  Tobacco Use   Smoking status: Never   Smokeless tobacco: Never  Substance and Sexual Activity  Alcohol use: Yes    Comment: rare   Drug use: No   Sexual activity: Not on file  Other Topics Concern   Not on file  Social History Narrative   Not on file   Social Determinants of Health   Financial Resource Strain: Not on file  Food Insecurity: Not on file  Transportation Needs: Not on file  Physical Activity: Not on file  Stress: Not on file  Social Connections: Not on file     Family History: The patient's family history includes Hypertension in her mother. There is no history of Thyroid disease or Diabetes.  ROS:   Please see the history of present illness.     All other systems reviewed and are negative.  EKGs/Labs/Other Studies Reviewed:    The following studies were reviewed today:  TEE 09/05/2021 1. Left ventricular ejection fraction, by estimation, is 65 to 70%. The  left ventricle has normal function. The left ventricle has no regional  wall motion abnormalities. Left ventricular diastolic function could not  be evaluated.   2. Right ventricular systolic function is normal. The right ventricular  size is normal.   3. Left atrial size was severely dilated. No left atrial/left atrial  appendage thrombus was detected. The LAA  emptying velocity was 27 cm/s.   4. Right atrial size was mildly dilated.   5. A single ruptured chorda tendina is seen, attached to the mid portion  of P2 (middle) scallop of the posterior leaflet. There is a fairly short  arc of the P2 scallop with flail motion. Flail gap 5 mm, flail width 9 mm.  Posterior leaflet length 15 mm,  valve area 6 cm sq, mean gradient 2 mm Hg at 111 bpm. MR 2D vena contracta  is 5 mm. 3D guided effective regurgitant orifice area measures 0.34 cm sq,  regurgitant volume 45 mL. There is blunted, but antegrade systolic flow in  the right and left pulmonary  veins. Due to atrial fibrillation, unable to accurately assess via PISA  method. The mitral valve is myxomatous. Moderate to severe mitral valve  regurgitation. No evidence of mitral stenosis.   6. The tricuspid valve is myxomatous.   7. The aortic valve is tricuspid. Aortic valve regurgitation is mild.   8. There is mild (Grade II) plaque involving the ascending aorta.   EKG:  EKG is ordered today.  The ekg ordered today demonstrates atrial fibrillation, heart rate 87 bpm.  Recent Labs: 07/30/2021: Magnesium 2.1 09/03/2021: B Natriuretic Peptide 1,182.0 09/05/2021: TSH 1.062 09/06/2021: Hemoglobin 13.4; Platelets 270 09/21/2021: ALT 116; BUN 26; Creatinine, Ser 0.98; Potassium 4.6; Sodium 134  Recent Lipid Panel    Component Value Date/Time   CHOL 144 07/26/2021 0923   TRIG 52 07/26/2021 0923   HDL 52 07/26/2021 0923   CHOLHDL 2.8 07/26/2021 0923   VLDL 10 07/26/2021 0923   LDLCALC 82 07/26/2021 0923     Risk Assessment/Calculations:    CHA2DS2-VASc Score = 6   This indicates a 9.7% annual risk of stroke. The patient's score is based upon: CHF History: 1 HTN History: 1 Diabetes History: 0 Stroke History: 0 Vascular Disease History: 1 Age Score: 2 Gender Score: 1          Physical Exam:    VS:  BP 112/62 (BP Location: Right Arm, Patient Position: Sitting, Cuff Size: Normal)   Pulse 79    Ht 5\' 3"  (1.6 m)   Wt 101 lb (45.8 kg)   BMI 17.89 kg/m  Wt Readings from Last 3 Encounters:  12/17/21 101 lb (45.8 kg)  10/09/21 101 lb 6.4 oz (46 kg)  09/21/21 102 lb 6.4 oz (46.4 kg)     GEN:  Well nourished, well developed in no acute distress HEENT: Normal NECK: No JVD; No carotid bruits LYMPHATICS: No lymphadenopathy CARDIAC: Irregularly irregular, no murmurs, rubs, gallops RESPIRATORY:  Clear to auscultation without rales, wheezing or rhonchi  ABDOMEN: Soft, non-tender, non-distended MUSCULOSKELETAL:  No edema; No deformity  SKIN: Warm and dry NEUROLOGIC:  Alert and oriented x 3 PSYCHIATRIC:  Normal affect   ASSESSMENT:    1. Persistent atrial fibrillation ([place])   2. Medication management   3. Nonrheumatic mitral valve regurgitation    PLAN:    In order of problems listed above:  Persistent atrial fibrillation: On Eliquis, diltiazem and metoprolol succinate.  Heart rate very well controlled.  She has baseline dyspnea on exertion given advanced age.  We have discussed possibility of cardioversion, however we are not sure if cardioversion is really going to change her overall condition or improve her quality of life.  She is also not sure she is willing to go through with cardioversion knowing it likely will not change how she feels.  I discussed the case with Dr. Stanford Breed and the patient extensively, we have eventually decided to continue with rate control therapy.  Mitral valve regurgitation: She has severe mitral valve regurgitation seen on previous TEE.  She was evaluated by Dr. Ali Lowe who felt she is a good candidate for MitraClip procedure.  Talking with the patient, she is very hesitant to proceed with any surgical procedure.  She is aware that medication therapy is now going to reverse anatomy and that she may run into issues with heart failure, lower extremity edema and worsening shortness of breath in the future.           Medication  Adjustments/Labs and Tests Ordered: Current medicines are reviewed at length with the patient today.  Concerns regarding medicines are outlined above.  Orders Placed This Encounter  Procedures   Basic metabolic panel   EKG [id number]   No orders of the defined types were placed in this encounter.   Patient Instructions  Medication Instructions:   Your physician recommends that you continue on your current medications as directed. Please refer to the Current Medication list given to you today.  *If you need a refill on your cardiac medications before your next appointment, please call your pharmacy*  Lab Work: Almyra Deforest, [place] recommends that you return for lab work in 2 months January 2024:  BMP  If you have labs (blood work) drawn today and your tests are completely normal, you will receive your results only by: [place] (if you have MyChart) OR A paper copy in the mail If you have any lab test that is abnormal or we need to change your treatment, we will call you to review the results.  Testing/Procedures: NONE ordered at this time of appointment   Follow-Up: At [facility], you and your health needs are our priority.  As part of our continuing mission to provide you with exceptional heart care, we have created designated Provider Care Teams.  These Care Teams include your primary Cardiologist (physician) and Advanced Practice Providers (APPs -  Physician Assistants and Nurse Practitioners) who all work together to provide you with the care you need, when you need it.  Your next appointment:   6 month(s)  The format for your next appointment:  In Person  Provider:   Kirk Ruths, MD     Other Instructions  Important Information About Sugar         Weston Brass Almyra Deforest, [place]  12/19/2021 9:34 PM    [place]

## 2021-12-17 NOTE — Patient Instructions (Addendum)
Medication Instructions:   Your physician recommends that you continue on your current medications as directed. Please refer to the Current Medication list given to you today.  *If you need a refill on your cardiac medications before your next appointment, please call your pharmacy*  Lab Work: Almyra Deforest, [place] recommends that you return for lab work in 2 months January 2024:  BMP  If you have labs (blood work) drawn today and your tests are completely normal, you will receive your results only by: [place] (if you have MyChart) OR A paper copy in the mail If you have any lab test that is abnormal or we need to change your treatment, we will call you to review the results.  Testing/Procedures: NONE ordered at this time of appointment   Follow-Up: At [facility], you and your health needs are our priority.  As part of our continuing mission to provide you with exceptional heart care, we have created designated Provider Care Teams.  These Care Teams include your primary Cardiologist (physician) and Advanced Practice Providers (APPs -  Physician Assistants and Nurse Practitioners) who all work together to provide you with the care you need, when you need it.  Your next appointment:   6 month(s)  The format for your next appointment:   In Person  Provider:   Kirk Ruths, MD     Other Instructions  Important Information About Sugar

## 2021-12-18 ENCOUNTER — Ambulatory Visit: Payer: Medicare PPO | Admitting: Internal Medicine

## 2022-01-07 DIAGNOSIS — Z681 Body mass index (BMI) 19 or less, adult: Secondary | ICD-10-CM | POA: Diagnosis not present

## 2022-01-07 DIAGNOSIS — M1991 Primary osteoarthritis, unspecified site: Secondary | ICD-10-CM | POA: Diagnosis not present

## 2022-01-07 DIAGNOSIS — M79641 Pain in right hand: Secondary | ICD-10-CM | POA: Diagnosis not present

## 2022-01-07 DIAGNOSIS — M79642 Pain in left hand: Secondary | ICD-10-CM | POA: Diagnosis not present

## 2022-01-07 DIAGNOSIS — M0609 Rheumatoid arthritis without rheumatoid factor, multiple sites: Secondary | ICD-10-CM | POA: Diagnosis not present

## 2022-01-10 ENCOUNTER — Emergency Department (HOSPITAL_COMMUNITY): Payer: Medicare PPO

## 2022-01-10 ENCOUNTER — Inpatient Hospital Stay (HOSPITAL_COMMUNITY)
Admission: EM | Admit: 2022-01-10 | Discharge: 2022-01-16 | DRG: 286 | Disposition: A | Discharge status: Home Health | Payer: Medicare PPO | Attending: Internal Medicine | Admitting: Internal Medicine

## 2022-01-10 ENCOUNTER — Other Ambulatory Visit: Payer: Self-pay

## 2022-01-10 ENCOUNTER — Encounter (HOSPITAL_COMMUNITY): Payer: Self-pay

## 2022-01-10 DIAGNOSIS — G9341 Metabolic encephalopathy: Secondary | ICD-10-CM | POA: Diagnosis not present

## 2022-01-10 DIAGNOSIS — Z7189 Other specified counseling: Secondary | ICD-10-CM

## 2022-01-10 DIAGNOSIS — J439 Emphysema, unspecified: Secondary | ICD-10-CM | POA: Diagnosis not present

## 2022-01-10 DIAGNOSIS — R0902 Hypoxemia: Secondary | ICD-10-CM

## 2022-01-10 DIAGNOSIS — J90 Pleural effusion, not elsewhere classified: Secondary | ICD-10-CM | POA: Diagnosis not present

## 2022-01-10 DIAGNOSIS — H9190 Unspecified hearing loss, unspecified ear: Secondary | ICD-10-CM | POA: Diagnosis present

## 2022-01-10 DIAGNOSIS — N179 Acute kidney failure, unspecified: Secondary | ICD-10-CM | POA: Diagnosis present

## 2022-01-10 DIAGNOSIS — R233 Spontaneous ecchymoses: Secondary | ICD-10-CM | POA: Diagnosis present

## 2022-01-10 DIAGNOSIS — I340 Nonrheumatic mitral (valve) insufficiency: Secondary | ICD-10-CM | POA: Diagnosis not present

## 2022-01-10 DIAGNOSIS — J9621 Acute and chronic respiratory failure with hypoxia: Secondary | ICD-10-CM | POA: Diagnosis present

## 2022-01-10 DIAGNOSIS — Z7901 Long term (current) use of anticoagulants: Secondary | ICD-10-CM

## 2022-01-10 DIAGNOSIS — N1831 Chronic kidney disease, stage 3a: Secondary | ICD-10-CM | POA: Diagnosis present

## 2022-01-10 DIAGNOSIS — Z1152 Encounter for screening for COVID-19: Secondary | ICD-10-CM

## 2022-01-10 DIAGNOSIS — Z7952 Long term (current) use of systemic steroids: Secondary | ICD-10-CM

## 2022-01-10 DIAGNOSIS — E1165 Type 2 diabetes mellitus with hyperglycemia: Secondary | ICD-10-CM | POA: Diagnosis present

## 2022-01-10 DIAGNOSIS — I341 Nonrheumatic mitral (valve) prolapse: Secondary | ICD-10-CM | POA: Insufficient documentation

## 2022-01-10 DIAGNOSIS — M810 Age-related osteoporosis without current pathological fracture: Secondary | ICD-10-CM | POA: Diagnosis present

## 2022-01-10 DIAGNOSIS — Z79620 Long term (current) use of immunosuppressive biologic: Secondary | ICD-10-CM

## 2022-01-10 DIAGNOSIS — Z8249 Family history of ischemic heart disease and other diseases of the circulatory system: Secondary | ICD-10-CM

## 2022-01-10 DIAGNOSIS — Z79890 Hormone replacement therapy (postmenopausal): Secondary | ICD-10-CM

## 2022-01-10 DIAGNOSIS — I5031 Acute diastolic (congestive) heart failure: Secondary | ICD-10-CM | POA: Diagnosis not present

## 2022-01-10 DIAGNOSIS — Z681 Body mass index (BMI) 19 or less, adult: Secondary | ICD-10-CM

## 2022-01-10 DIAGNOSIS — E43 Unspecified severe protein-calorie malnutrition: Secondary | ICD-10-CM | POA: Diagnosis present

## 2022-01-10 DIAGNOSIS — I509 Heart failure, unspecified: Secondary | ICD-10-CM | POA: Diagnosis not present

## 2022-01-10 DIAGNOSIS — R64 Cachexia: Secondary | ICD-10-CM | POA: Diagnosis not present

## 2022-01-10 DIAGNOSIS — I5033 Acute on chronic diastolic (congestive) heart failure: Secondary | ICD-10-CM | POA: Diagnosis present

## 2022-01-10 DIAGNOSIS — I10 Essential (primary) hypertension: Secondary | ICD-10-CM | POA: Diagnosis present

## 2022-01-10 DIAGNOSIS — M069 Rheumatoid arthritis, unspecified: Secondary | ICD-10-CM | POA: Diagnosis present

## 2022-01-10 DIAGNOSIS — E0590 Thyrotoxicosis, unspecified without thyrotoxic crisis or storm: Secondary | ICD-10-CM | POA: Diagnosis present

## 2022-01-10 DIAGNOSIS — K219 Gastro-esophageal reflux disease without esophagitis: Secondary | ICD-10-CM | POA: Diagnosis present

## 2022-01-10 DIAGNOSIS — I480 Paroxysmal atrial fibrillation: Secondary | ICD-10-CM | POA: Diagnosis not present

## 2022-01-10 DIAGNOSIS — E7800 Pure hypercholesterolemia, unspecified: Secondary | ICD-10-CM | POA: Diagnosis present

## 2022-01-10 DIAGNOSIS — I110 Hypertensive heart disease with heart failure: Secondary | ICD-10-CM | POA: Diagnosis not present

## 2022-01-10 DIAGNOSIS — I130 Hypertensive heart and chronic kidney disease with heart failure and stage 1 through stage 4 chronic kidney disease, or unspecified chronic kidney disease: Secondary | ICD-10-CM | POA: Diagnosis present

## 2022-01-10 DIAGNOSIS — I081 Rheumatic disorders of both mitral and tricuspid valves: Secondary | ICD-10-CM | POA: Diagnosis not present

## 2022-01-10 DIAGNOSIS — R531 Weakness: Secondary | ICD-10-CM | POA: Diagnosis not present

## 2022-01-10 DIAGNOSIS — Z7401 Bed confinement status: Secondary | ICD-10-CM | POA: Diagnosis not present

## 2022-01-10 DIAGNOSIS — Z888 Allergy status to other drugs, medicaments and biological substances status: Secondary | ICD-10-CM

## 2022-01-10 DIAGNOSIS — E876 Hypokalemia: Secondary | ICD-10-CM | POA: Diagnosis not present

## 2022-01-10 DIAGNOSIS — E162 Hypoglycemia, unspecified: Secondary | ICD-10-CM | POA: Diagnosis present

## 2022-01-10 DIAGNOSIS — Z79899 Other long term (current) drug therapy: Secondary | ICD-10-CM

## 2022-01-10 DIAGNOSIS — I4821 Permanent atrial fibrillation: Secondary | ICD-10-CM

## 2022-01-10 DIAGNOSIS — T380X5A Adverse effect of glucocorticoids and synthetic analogues, initial encounter: Secondary | ICD-10-CM | POA: Diagnosis not present

## 2022-01-10 DIAGNOSIS — R21 Rash and other nonspecific skin eruption: Secondary | ICD-10-CM | POA: Diagnosis not present

## 2022-01-10 DIAGNOSIS — R4182 Altered mental status, unspecified: Secondary | ICD-10-CM | POA: Diagnosis not present

## 2022-01-10 DIAGNOSIS — R0602 Shortness of breath: Secondary | ICD-10-CM | POA: Diagnosis not present

## 2022-01-10 DIAGNOSIS — Z7983 Long term (current) use of bisphosphonates: Secondary | ICD-10-CM

## 2022-01-10 DIAGNOSIS — Z881 Allergy status to other antibiotic agents status: Secondary | ICD-10-CM

## 2022-01-10 DIAGNOSIS — Z515 Encounter for palliative care: Secondary | ICD-10-CM

## 2022-01-10 DIAGNOSIS — E871 Hypo-osmolality and hyponatremia: Secondary | ICD-10-CM | POA: Diagnosis not present

## 2022-01-10 DIAGNOSIS — J811 Chronic pulmonary edema: Secondary | ICD-10-CM | POA: Diagnosis not present

## 2022-01-10 DIAGNOSIS — D849 Immunodeficiency, unspecified: Secondary | ICD-10-CM | POA: Diagnosis present

## 2022-01-10 DIAGNOSIS — Z9181 History of falling: Secondary | ICD-10-CM

## 2022-01-10 DIAGNOSIS — R54 Age-related physical debility: Secondary | ICD-10-CM | POA: Diagnosis present

## 2022-01-10 DIAGNOSIS — R918 Other nonspecific abnormal finding of lung field: Secondary | ICD-10-CM | POA: Diagnosis not present

## 2022-01-10 LAB — CBC WITH DIFFERENTIAL/PLATELET
Abs Immature Granulocytes: 0.04 10*3/uL (ref 0.00–0.07)
Basophils Absolute: 0.1 10*3/uL (ref 0.0–0.1)
Basophils Relative: 1 %
Eosinophils Absolute: 0.1 10*3/uL (ref 0.0–0.5)
Eosinophils Relative: 1 %
HCT: 43.2 % (ref 36.0–46.0)
Hemoglobin: 14.3 g/dL (ref 12.0–15.0)
Immature Granulocytes: 0 %
Lymphocytes Relative: 11 %
Lymphs Abs: 1.3 10*3/uL (ref 0.7–4.0)
MCH: 33.3 pg (ref 26.0–34.0)
MCHC: 33.1 g/dL (ref 30.0–36.0)
MCV: 100.5 fL — ABNORMAL HIGH (ref 80.0–100.0)
Monocytes Absolute: 1 10*3/uL (ref 0.1–1.0)
Monocytes Relative: 8 %
Neutro Abs: 9.5 10*3/uL — ABNORMAL HIGH (ref 1.7–7.7)
Neutrophils Relative %: 79 %
Platelets: 198 10*3/uL (ref 150–400)
RBC: 4.3 MIL/uL (ref 3.87–5.11)
RDW: 16.2 % — ABNORMAL HIGH (ref 11.5–15.5)
WBC: 12 10*3/uL — ABNORMAL HIGH (ref 4.0–10.5)
nRBC: 0 % (ref 0.0–0.2)

## 2022-01-10 LAB — COMPREHENSIVE METABOLIC PANEL
ALT: 147 U/L — ABNORMAL HIGH (ref 0–44)
AST: 114 U/L — ABNORMAL HIGH (ref 15–41)
Albumin: 3.7 g/dL (ref 3.5–5.0)
Alkaline Phosphatase: 230 U/L — ABNORMAL HIGH (ref 38–126)
Anion gap: 13 (ref 5–15)
BUN: 40 mg/dL — ABNORMAL HIGH (ref 8–23)
CO2: 23 mmol/L (ref 22–32)
Calcium: 8.9 mg/dL (ref 8.9–10.3)
Chloride: 95 mmol/L — ABNORMAL LOW (ref 98–111)
Creatinine, Ser: 1.28 mg/dL — ABNORMAL HIGH (ref 0.44–1.00)
GFR, Estimated: 40 mL/min — ABNORMAL LOW (ref >60)
Glucose, Bld: 158 mg/dL — ABNORMAL HIGH (ref 70–99)
Potassium: 4.2 mmol/L (ref 3.5–5.1)
Sodium: 131 mmol/L — ABNORMAL LOW (ref 135–145)
Total Bilirubin: 1.3 mg/dL — ABNORMAL HIGH (ref 0.3–1.2)
Total Protein: 6.2 g/dL — ABNORMAL LOW (ref 6.5–8.1)

## 2022-01-10 LAB — BLOOD GAS, VENOUS
Acid-Base Excess: 1.3 mmol/L (ref 0.0–2.0)
Bicarbonate: 27.2 mmol/L (ref 20.0–28.0)
O2 Saturation: 17.2 %
Patient temperature: 37
pCO2, Ven: 47 mmHg (ref 44–60)
pH, Ven: 7.37 (ref 7.25–7.43)
pO2, Ven: <31 mmHg — CL (ref 32–45)

## 2022-01-10 LAB — RESP PANEL BY RT-PCR (FLU A&B, COVID) ARPGX2
Influenza A by PCR: NEGATIVE
Influenza B by PCR: NEGATIVE
SARS Coronavirus 2 by RT PCR: NEGATIVE

## 2022-01-10 LAB — TROPONIN I (HIGH SENSITIVITY)
Troponin I (High Sensitivity): 12 ng/L (ref <18)
Troponin I (High Sensitivity): 11 ng/L (ref <18)

## 2022-01-10 LAB — TSH: TSH: 10.519 u[IU]/mL — ABNORMAL HIGH (ref 0.350–4.500)

## 2022-01-10 LAB — BRAIN NATRIURETIC PEPTIDE: B Natriuretic Peptide: 1851.3 pg/mL — ABNORMAL HIGH (ref 0.0–100.0)

## 2022-01-10 MED ORDER — METHIMAZOLE 10 MG PO TABS: 10.0000 mg | ORAL_TABLET | Freq: Two times a day (BID) | ORAL | Status: DC

## 2022-01-10 MED ORDER — ENSURE ENLIVE PO LIQD
237.0000 mL | Freq: Three times a day (TID) | ORAL | Status: DC
  Administered 2022-01-11 – 2022-01-16 (×10): 237 mL via ORAL
  Filled 2022-01-10 (×3): qty 237

## 2022-01-10 MED ORDER — APIXABAN 2.5 MG PO TABS
2.5000 mg | ORAL_TABLET | Freq: Two times a day (BID) | ORAL | Status: DC
  Administered 2022-01-11 – 2022-01-13 (×6): 2.5 mg via ORAL
  Filled 2022-01-10 (×6): qty 1

## 2022-01-10 MED ORDER — METOPROLOL SUCCINATE ER 50 MG PO TB24: 150.0000 mg | ORAL_TABLET | Freq: Every morning | ORAL | Status: DC

## 2022-01-10 MED ORDER — FUROSEMIDE 10 MG/ML IJ SOLN
40.0000 mg | Freq: Once | INTRAMUSCULAR | Status: AC
  Administered 2022-01-10: 40 mg via INTRAVENOUS
  Filled 2022-01-10: qty 4

## 2022-01-10 MED ORDER — MONTELUKAST SODIUM 10 MG PO TABS
10.0000 mg | ORAL_TABLET | Freq: Every day | ORAL | Status: DC
  Administered 2022-01-11 – 2022-01-16 (×6): 10 mg via ORAL
  Filled 2022-01-10 (×6): qty 1

## 2022-01-10 MED ORDER — METHIMAZOLE 5 MG PO TABS
5.0000 mg | ORAL_TABLET | Freq: Every day | ORAL | Status: DC
  Filled 2022-01-10: qty 1

## 2022-01-10 MED ORDER — FAMOTIDINE 20 MG PO TABS
20.0000 mg | ORAL_TABLET | Freq: Two times a day (BID) | ORAL | Status: DC
  Administered 2022-01-11 – 2022-01-15 (×10): 20 mg via ORAL
  Filled 2022-01-10 (×10): qty 1

## 2022-01-10 MED ORDER — IOHEXOL 300 MG/ML  SOLN
80.0000 mL | Freq: Once | INTRAMUSCULAR | Status: AC | PRN
  Administered 2022-01-10: 80 mL via INTRAVENOUS

## 2022-01-10 MED ORDER — DILTIAZEM HCL ER COATED BEADS 180 MG PO CP24
180.0000 mg | ORAL_CAPSULE | Freq: Every day | ORAL | Status: DC
  Administered 2022-01-11 – 2022-01-16 (×6): 180 mg via ORAL
  Filled 2022-01-10 (×6): qty 1

## 2022-01-10 MED ORDER — PREDNISONE 5 MG PO TABS
2.5000 mg | ORAL_TABLET | Freq: Every day | ORAL | Status: DC
  Administered 2022-01-11 – 2022-01-16 (×6): 2.5 mg via ORAL
  Filled 2022-01-10 (×6): qty 1

## 2022-01-10 MED ORDER — METOPROLOL SUCCINATE ER 50 MG PO TB24
150.0000 mg | ORAL_TABLET | Freq: Every morning | ORAL | Status: DC
  Administered 2022-01-11 – 2022-01-16 (×6): 150 mg via ORAL
  Filled 2022-01-10 (×4): qty 1
  Filled 2022-01-10: qty 3
  Filled 2022-01-10: qty 1

## 2022-01-10 NOTE — ED Triage Notes (Addendum)
Patient reports that she has "felt bad " all day.  Patient's son checked the patient in and states she had AMS, but is parking the car at this time.  Patient states "I'm really tired."  Patient's hands are cold. A warm blanket placed on her hands. Sats read 79% Paient placed on O2 2L/min via [employer]

## 2022-01-10 NOTE — ED Provider Notes (Signed)
[facility]-EMERGENCY DEPT Provider Note   CSN: [id number] Arrival date & time: 01/10/22  1855     History  Chief Complaint  Patient presents with   Altered Mental Status   Shortness of Breath    Nalleli C Subia is a 88 y.o. female history of A-fib on Eliquis, mitral valve prolapse, here presenting with shortness of breath and altered mental status.  Patient is from a independent living facility.  She has been more short of breath on the phone today.  Son went to check in on her and just noticed that she is very tachypneic and was struggling to breathe.  Patient was noted to be hypoxic 80% on room air in triage.  Patient was put on 2 L nasal cannula.  Patient is not normally on oxygen at home.  Patient is taking her Eliquis as prescribed.  Denies any worsening leg swelling.  Per the son, he does not know of any COVID or flu exposure at the facility.  The history is provided by the patient.       Home Medications Prior to Admission medications   Medication Sig Start Date End Date Taking? Authorizing Provider  acetaminophen (TYLENOL) 325 MG tablet Take 1 tablet (325 mg total) by mouth daily with supper. 09/06/21   Hongalgi, Maximino Greenland, MD  alendronate (FOSAMAX) 70 MG tablet Take 70 mg by mouth every Wednesday. 08/10/14   [provider]  apixaban (ELIQUIS) 2.5 MG TABS tablet Take 1 tablet (2.5 mg total) by mouth 2 (two) times daily. 07/31/21   Alwyn Ren, MD  Ascorbic Acid (VITAMIN C PO) Take 1 tablet by mouth every morning.    [provider]  CALCIUM PO Take 1 tablet by mouth daily with supper.    [provider]  cetirizine (ZYRTEC) 10 MG tablet Take 1 tablet (10 mg total) by mouth 2 (two) times daily. 10/28/19   Marcelyn Bruins, MD  Cholecalciferol (VITAMIN D3 PO) Take 1 capsule by mouth daily with supper.    [provider]  diltiazem (CARDIZEM CD) 180 MG 24 hr capsule Take 1 capsule (180 mg total) by mouth  daily. 08/01/21   Alwyn Ren, MD  EPINEPHrine (EPIPEN) 0.3 mg/0.3 mL IJ SOAJ injection Inject 0.3 mg into the muscle once as needed for anaphylaxis. 09/06/21   Hongalgi, Maximino Greenland, MD  famotidine (PEPCID) 20 MG tablet Take 1 tablet (20 mg total) by mouth 2 (two) times daily. 10/28/19   Marcelyn Bruins, MD  feeding supplement (ENSURE ENLIVE / ENSURE PLUS) LIQD 237 ml between meals 09/07/21   [provider]  furosemide (LASIX) 40 MG tablet Take 1 tablet (40 mg total) by mouth daily. 09/07/21   Hongalgi, Maximino Greenland, MD  latanoprost (XALATAN) 0.005 % ophthalmic solution Place 1 drop into the left eye at bedtime. 09/14/19   [provider]  leflunomide (ARAVA) 10 MG tablet Take 10 mg by mouth every morning. 07/24/21   [provider]  methimazole (TAPAZOLE) 10 MG tablet Take 1 tablet (10 mg total) by mouth 2 (two) times daily. 07/31/21   Alwyn Ren, MD  metoprolol succinate (TOPROL-XL) 100 MG 24 hr tablet Take 1.5 tablets (150 mg total) by mouth every morning. 09/07/21   Hongalgi, Maximino Greenland, MD  Misc Natural Products (OSTEO BI-FLEX JOINT SHIELD PO) Take 1 capsule by mouth daily with supper.    [provider]  montelukast (SINGULAIR) 10 MG tablet Take 1 tablet (10 mg total) by mouth  daily. Patient taking differently: Take 10 mg by mouth every evening. 12/06/19   Marcelyn Bruins, MD  Multiple Vitamin (MULTIVITAMIN WITH MINERALS) TABS tablet Take 1 tablet by mouth every morning.    [provider]  Omega-3 Fatty Acids (FISH OIL) 1000 MG CAPS Take 1,000 mg by mouth every morning.    [provider]  predniSONE (DELTASONE) 2.5 MG tablet Take 1 tablet (2.5 mg total) by mouth daily with breakfast. 08/01/21   Alwyn Ren, MD  Probiotic Product (PROBIOTIC DAILY PO) Take 1 capsule by mouth daily with supper.    [provider]  timolol (TIMOPTIC) 0.5 % ophthalmic solution Place 1 drop into both eyes 2 (two) times daily.     [provider]      Allergies    Cozaar [losartan], Erythromycin, and Azopt [brinzolamide]    Review of Systems   Review of Systems  Respiratory:  Positive for shortness of breath.   All other systems reviewed and are negative.   Physical Exam Updated Vital Signs BP (!) 158/99   Pulse 73   Temp (!) 96.8 F (36 C) (Rectal)   Resp 18   SpO2 98%  Physical Exam Vitals and nursing note reviewed.  Constitutional:      Comments: Tachypneic  HENT:     Head: Normocephalic.  Eyes:     Extraocular Movements: Extraocular movements intact.     Pupils: Pupils are equal, round, and reactive to light.  Cardiovascular:     Rate and Rhythm: Normal rate.     Comments: Holosystolic murmur in the left sternal base. Pulmonary:     Comments: Tachypneic, diminished bilaterally.  No obvious wheezing or crackles. Abdominal:     General: Bowel sounds are normal.     Palpations: Abdomen is soft.  Musculoskeletal:        General: Normal range of motion.     Cervical back: Normal range of motion and neck supple.     Comments: No pedal edema  Skin:    General: Skin is warm.  Neurological:     General: No focal deficit present.     Mental Status: She is oriented to person, place, and time.  Psychiatric:        Mood and Affect: Mood normal.        Behavior: Behavior normal.     ED Results / Procedures / Treatments   Labs (all labs ordered are listed, but only abnormal results are displayed) Labs Reviewed  CBC WITH DIFFERENTIAL/PLATELET - Abnormal; Notable for the following components:      Result Value   WBC 12.0 (*)    MCV 100.5 (*)    RDW 16.2 (*)    Neutro Abs 9.5 (*)    All other components within normal limits  BLOOD GAS, VENOUS - Abnormal; Notable for the following components:   pO2, Ven <31 (*)    All other components within normal limits  RESP PANEL BY RT-PCR (FLU A&B, COVID) ARPGX2  COMPREHENSIVE METABOLIC PANEL  TSH  BRAIN NATRIURETIC PEPTIDE  TROPONIN I  (HIGH SENSITIVITY)    EKG EKG Interpretation  Date/Time:  Thursday January 10 2022 19:31:05 EST Ventricular Rate:  90 PR Interval:    QRS Duration: 96 QT Interval:  428 QTC Calculation: 524 R Axis:   33 Text Interpretation: Atrial fibrillation RSR' in V1 or V2, probably normal variant Prolonged QT interval No significant change since last tracing Confirmed by Richardean Canal [phone]) on 01/10/2022 7:46:01 PM  Radiology No results found.  Procedures Procedures    CRITICAL CARE Performed by: Richardean Canal   Total critical care time: 30 minutes  Critical care time was exclusive of separately billable procedures and treating other patients.  Critical care was necessary to treat or prevent imminent or life-threatening deterioration.  Critical care was time spent personally by me on the following activities: development of treatment plan with patient and/or surrogate as well as nursing, discussions with consultants, evaluation of patient's response to treatment, examination of patient, obtaining history from patient or surrogate, ordering and performing treatments and interventions, ordering and review of laboratory studies, ordering and review of radiographic studies, pulse oximetry and re-evaluation of patient's condition.   Medications Ordered in ED Medications - No data to display  ED Course/ Medical Decision Making/ A&P                           Medical Decision Making Jezabella Schriever Righetti is a 88 y.o. female here presenting with hypoxia and shortness of breath.  Patient is on Eliquis for A-fib.  Patient has new hypoxia with oxygen of 80%.  I think the hypoxia likely from COVID versus flu versus pneumonia.  Also consider heart failure exacerbation or worsening mitral prolapse.  Went to get CBC and CMP and BNP and troponin and chest x-ray.  Patient will need admission for new hypoxia  10:51 PM I reviewed patient's labs and independently interpreted CT scans.  Patient has bilateral  pleural effusions on chest x-ray.  CT scan also confirmed the pleural effusions.  Patient's BNP is 1800.  Since patient has new oxygen requirement, patient will need to be admitted.  Patient was given Lasix 40 mg IV.  Patient likely will need another echo.  I am concerned for worsening mitral regurgitation.  Problems Addressed: Congestive heart failure, unspecified HF chronicity, unspecified heart failure type [facility]): acute illness or injury Hypoxia: acute illness or injury Mitral valve insufficiency, unspecified etiology: acute illness or injury  Amount and/or Complexity of Data Reviewed Labs: ordered. Decision-making details documented in ED Course. Radiology: ordered and independent interpretation performed. Decision-making details documented in ED Course. ECG/medicine tests: ordered and independent interpretation performed. Decision-making details documented in ED Course.  Risk Prescription drug management. Decision regarding hospitalization.    Final Clinical Impression(s) / ED Diagnoses Final diagnoses:  None    Rx / DC Orders ED Discharge Orders     None         Charlynne Pander, MD 01/10/22 2256

## 2022-01-10 NOTE — H&P (Addendum)
PCP:   Emily Crews, FNP   Chief Complaint:  Shortness of breath  HPI: This is a 88 year old female with past medical history of hypertension, and dyslipidemia.  Patient lives in the independent living center and at baseline is alert and oriented and does all her ADLs.  Her son comes and sets her meds up for the week.  Per patient's son, last week she had a day where she was confused.  She was not sure if it was night or day which was unusual for her.  Her son thought she likely had mix of her medications.  At this time past, her confusion did not resolve.  Yesterday she complained of having a hard time breathing.  She was unable to lay flat to sleep.  He was attempted several times breathing.  He brought her to the ER. Denies fever, chills, nausea or vomiting.  She does have ongoing issue with diarrhea at baseline.    In the ER patient is mildly confused but mostly fidgety.  Patient's BNP 1,851, most recent BNP 1,182 this time/24/23.  Cardiac enzymes negative.  CT chest with evidence of acute infection, CT head without evidence of acute process.  EKG with mild RVR heart rate 90-110.  Patient is on 3 L oxygen to keep sats greater than 88%.  On room air patient 80%.  History provided by the patient son who is present at bedside.  Review of Systems:  The patient denies anorexia, fever, weight loss,, vision loss, decreased hearing, hoarseness, chest pain, syncope, dyspnea on exertion, peripheral edema, balance deficits, hemoptysis, abdominal pain, melena, hematochezia, severe indigestion/heartburn, hematuria, incontinence, genital sores, muscle weakness, suspicious skin lesions, transient blindness, difficulty walking, depression, unusual weight change, abnormal bleeding, enlarged lymph nodes, angioedema, and breast masses. Positive: Shortness of breath, orthopnea, confusion  Past Medical History: Past Medical History:  Diagnosis Date   Arthritis    Glaucoma of both eyes    Hyperlipidemia     Hypertension    Pelvic prolapse    anterior floor   Urgency of urination    Urinary hesitancy    Urticaria    Past Surgical History:  Procedure Laterality Date   PUBOVAGINAL SLING N/A 08/31/2012   Procedure: BOSTON SCIENTIFIC UPHOLD LIGHT [place];  Surgeon: Ailene Rud, MD;  Location: [facility];  Service: Urology;  Laterality: N/A;   RETINAL DETACHMENT SURGERY Right    TEE WITHOUT CARDIOVERSION N/A 09/05/2021   Procedure: TRANSESOPHAGEAL ECHOCARDIOGRAM (TEE);  Surgeon: Sanda Klein, MD;  Location[facility] ENDOSCOPY;  Service: Cardiovascular;  Laterality: N/A;    Medications: Prior to Admission medications   Medication Sig Start Date End Date Taking? Authorizing Provider  acetaminophen (TYLENOL) 325 MG tablet Take 1 tablet (325 mg total) by mouth daily with supper. 09/06/21   Hongalgi, Lenis Dickinson, MD  alendronate (FOSAMAX) 70 MG tablet Take 70 mg by mouth every Wednesday. 08/10/14   [provider]  apixaban (ELIQUIS) 2.5 MG TABS tablet Take 1 tablet (2.5 mg total) by mouth 2 (two) times daily. 07/31/21   Georgette Shell, MD  Ascorbic Acid (VITAMIN C PO) Take 1 tablet by mouth every morning.    [provider]  CALCIUM PO Take 1 tablet by mouth daily with supper.    [provider]  cetirizine (ZYRTEC) 10 MG tablet Take 1 tablet (10 mg total) by mouth 2 (two) times daily. 10/28/19   Kennith Gain, MD  Cholecalciferol (VITAMIN D3 PO) Take 1 capsule by mouth daily  with supper.    [provider]  diltiazem (CARDIZEM CD) 180 MG 24 hr capsule Take 1 capsule (180 mg total) by mouth daily. 08/01/21   Georgette Shell, MD  EPINEPHrine (EPIPEN) 0.3 mg/0.3 mL IJ SOAJ injection Inject 0.3 mg into the muscle once as needed for anaphylaxis. 09/06/21   Hongalgi, Lenis Dickinson, MD  famotidine (PEPCID) 20 MG tablet Take 1 tablet (20 mg total) by mouth 2 (two) times daily. 10/28/19   Kennith Gain, MD  feeding  supplement (ENSURE ENLIVE / ENSURE PLUS) LIQD 237 ml between meals 09/07/21   [provider]  furosemide (LASIX) 40 MG tablet Take 1 tablet (40 mg total) by mouth daily. 09/07/21   Hongalgi, Lenis Dickinson, MD  latanoprost (XALATAN) 0.005 % ophthalmic solution Place 1 drop into the left eye at bedtime. 09/14/19   [provider]  leflunomide (ARAVA) 10 MG tablet Take 10 mg by mouth every morning. 07/24/21   [provider]  methimazole (TAPAZOLE) 10 MG tablet Take 1 tablet (10 mg total) by mouth 2 (two) times daily. 07/31/21   Georgette Shell, MD  metoprolol succinate (TOPROL-XL) 100 MG 24 hr tablet Take 1.5 tablets (150 mg total) by mouth every morning. 09/07/21   Hongalgi, Lenis Dickinson, MD  Misc Natural Products (OSTEO BI-FLEX JOINT SHIELD PO) Take 1 capsule by mouth daily with supper.    [provider]  montelukast (SINGULAIR) 10 MG tablet Take 1 tablet (10 mg total) by mouth daily. Patient taking differently: Take 10 mg by mouth every evening. 12/06/19   Kennith Gain, MD  Multiple Vitamin (MULTIVITAMIN WITH MINERALS) TABS tablet Take 1 tablet by mouth every morning.    [provider]  Omega-3 Fatty Acids (FISH OIL) 1000 MG CAPS Take 1,000 mg by mouth every morning.    [provider]  predniSONE (DELTASONE) 2.5 MG tablet Take 1 tablet (2.5 mg total) by mouth daily with breakfast. 08/01/21   Georgette Shell, MD  Probiotic Product (PROBIOTIC DAILY PO) Take 1 capsule by mouth daily with supper.    [provider]  timolol (TIMOPTIC) 0.5 % ophthalmic solution Place 1 drop into both eyes 2 (two) times daily.    [provider]    Allergies:   Allergies  Allergen Reactions   Cozaar [Losartan] Swelling    Tongue swelling   Erythromycin Itching   Azopt [Brinzolamide] Rash    Rash on eyelid    Social History:  reports that she has never smoked. She has never used smokeless tobacco. She reports that she does not  currently use alcohol. She reports that she does not use drugs.  Family History: Family History  Problem Relation Age of Onset   Hypertension Mother    Thyroid disease Neg Hx    Diabetes Neg Hx     Physical Exam: Vitals:   01/10/22 2115 01/10/22 2200 01/10/22 2230 01/10/22 2300  BP: (!) 140/126 (!) 138/99 (!) 126/92 (!) 153/95  Pulse: 97 99 (!) 103 (!) 29  Resp: (!) 21 14 20  (!) 24  Temp:      TempSrc:      SpO2: 98% 100% 96% 100%    General:  Alert but badly confused, slender, agitated Eyes: PERRLA, pink conjunctiva, no scleral icterus ENT: Moist oral mucosa, neck supple, no thyromegaly Lungs: clear to ascultation, no wheeze, no crackles, no use of accessory muscles Cardiovascular: irregular rate and irrhythm, 3 of 5 for systolic ejection murmur, no regurgitation, no gallops, No  carotid bruits, no JVD Abdomen: soft, positive BS, non-tender, non-distended, no organomegaly, not an acute abdomen GU: not examined Neuro: CN II - XII grossly intact, sensation intact Musculoskeletal: strength 5/5 all extremities, no clubbing, cyanosis or edema Skin: Rash bilateral lower extremities over ankle and foot.  Pinpoint nonblanching rash.  No decubitus Psych: Somewhat confused patient   Labs on Admission:  Recent Labs    01/10/22 1941  NA 131*  K 4.2  CL 95*  CO2 23  GLUCOSE 158*  BUN 40*  CREATININE 1.28*  CALCIUM 8.9   Recent Labs    01/10/22 1941  AST 114*  ALT 147*  ALKPHOS 230*  BILITOT 1.3*  PROT 6.2*  ALBUMIN 3.7    Recent Labs    01/10/22 1941  WBC 12.0*  NEUTROABS 9.5*  HGB 14.3  HCT 43.2  MCV 100.5*  PLT 198    Recent Labs    01/10/22 1941  TSH 10.519*     Micro Results: Recent Results (from the past 240 hour(s))  Resp Panel by RT-PCR (Flu A&B, Covid) Anterior Nasal Swab     Status: None   Collection Time: 01/10/22  7:41 PM   Specimen: Anterior Nasal Swab  Result Value Ref Range Status   SARS Coronavirus 2 by RT PCR NEGATIVE NEGATIVE Final     Comment: (NOTE) SARS-CoV-2 target nucleic acids are NOT DETECTED.  The SARS-CoV-2 RNA is generally detectable in upper respiratory specimens during the acute phase of infection. The lowest concentration of SARS-CoV-2 viral copies this assay can detect is 138 copies/mL. A negative result does not preclude SARS-Cov-2 infection and should not be used as the sole basis for treatment or other patient management decisions. A negative result may occur with  improper specimen collection/handling, submission of specimen other than nasopharyngeal swab, presence of viral mutation(s) within the areas targeted by this assay, and inadequate number of viral copies(<138 copies/mL). A negative result must be combined with clinical observations, patient history, and epidemiological information. The expected result is Negative.  Fact Sheet for Patients:  BloggerCourse.com  Fact Sheet for Healthcare Providers:  SeriousBroker.it  This test is no t yet approved or cleared by the [place] FDA and  has been authorized for detection and/or diagnosis of SARS-CoV-2 by FDA under an Emergency Use Authorization (EUA). This EUA will remain  in effect (meaning this test can be used) for the duration of the COVID-19 declaration under Section 564(b)(1) of the Act, 21 U.S.C.section 360bbb-3(b)(1), unless the authorization is terminated  or revoked sooner.       Influenza A by PCR NEGATIVE NEGATIVE Final   Influenza B by PCR NEGATIVE NEGATIVE Final    Comment: (NOTE) The Xpert Xpress SARS-CoV-2/FLU/RSV plus assay is intended as an aid in the diagnosis of influenza from Nasopharyngeal swab specimens and should not be used as a sole basis for treatment. Nasal washings and aspirates are unacceptable for Xpert Xpress SARS-CoV-2/FLU/RSV testing.  Fact Sheet for Patients: BloggerCourse.com  Fact Sheet for Healthcare  Providers: SeriousBroker.it  This test is not yet approved or cleared by the [place] FDA and has been authorized for detection and/or diagnosis of SARS-CoV-2 by FDA under an Emergency Use Authorization (EUA). This EUA will remain in effect (meaning this test can be used) for the duration of the COVID-19 declaration under Section 564(b)(1) of the Act, 21 U.S.C. section 360bbb-3(b)(1), unless the authorization is terminated or revoked.  Performed at [facility], [address] [place], [place] [address]  Radiological Exams on Admission: CT Chest W Contrast  Result Date: 01/10/2022 CLINICAL DATA:  Altered level of consciousness, fatigue, short of breath, hypoxia EXAM: CT CHEST WITH CONTRAST TECHNIQUE: Multidetector CT imaging of the chest was performed during intravenous contrast administration. RADIATION DOSE REDUCTION: This exam was performed according to the departmental dose-optimization program which includes automated exposure control, adjustment of the mA and/or kV according to patient size and/or use of iterative reconstruction technique. CONTRAST:  75mL OMNIPAQUE IOHEXOL 300 MG/ML  SOLN COMPARISON:  01/10/2022, 09/03/2021, 01/12/2021 FINDINGS: Cardiovascular: The heart is enlarged with prominent biatrial dilatation. No pericardial effusion. No pulmonary emboli. No evidence of thoracic aortic aneurysm or dissection. Atherosclerosis of the aorta. Mediastinum/Nodes: No enlarged mediastinal, hilar, or axillary lymph nodes. Thyroid gland, trachea, and esophagus demonstrate no significant findings. Lungs/Pleura: There are persistent bilateral pleural effusions less than 1 L each. Ground-glass nodularity at the right apex measuring up to 1.5 cm is unchanged since prior exam, reference image 17/6. No new airspace disease. No pneumothorax. Stable background emphysema. Central airways are patent. Upper Abdomen: Reflux of contrast into the  hepatic veins suggesting cardiac dysfunction. No acute upper abdominal findings. Musculoskeletal: No acute or destructive bony lesions. Reconstructed images demonstrate no additional findings. IMPRESSION: 1. Stable bilateral pleural effusions. 2. Stable ground-glass nodularity at the right apex, unchanged since 01/12/2021. Follow-up CT is recommended every 2 years until 5 years of stability has been established. This recommendation follows the consensus statement: Guidelines for Management of Incidental Pulmonary Nodules Detected on CT Images: From the Fleischner Society 2017; Radiology 2017; 284:228-243. 3. No evidence of pulmonary embolus. 4. Cardiomegaly with prominent biatrial dilatation. 5.  Aortic Atherosclerosis (ICD10-I70.0). Electronically Signed   By: Sharlet Salina M.D.   On: 01/10/2022 22:24   CT HEAD WO CONTRAST ( )  Result Date: 01/10/2022 CLINICAL DATA:  Mental status change, unknown cause EXAM: CT HEAD WITHOUT CONTRAST TECHNIQUE: Contiguous axial images were obtained from the base of the skull through the vertex without intravenous contrast. RADIATION DOSE REDUCTION: This exam was performed according to the departmental dose-optimization program which includes automated exposure control, adjustment of the mA and/or kV according to patient size and/or use of iterative reconstruction technique. COMPARISON:  07/25/2021 FINDINGS: Brain: There is periventricular white matter decreased attenuation consistent with small vessel ischemic changes. Ventricles, sulci and cisterns are prominent consistent with age related involutional changes. No acute intracranial hemorrhage, mass effect or shift. No hydrocephalus. Encephalomalacia identified consistent with chronic bilateral basal ganglia lacunar CVAs. Vascular: No hyperdense vessel or unexpected calcification. Skull: Normal. Negative for fracture or focal lesion. Sinuses/Orbits: No acute finding. IMPRESSION: Atrophy and chronic small vessel ischemic  changes. Chronic lacunar CVAs bilateral basal ganglia. No acute intracranial process identified. Electronically Signed   By: Layla Maw M.D.   On: 01/10/2022 21:42   DG Chest Port 1 View  Result Date: 01/10/2022 CLINICAL DATA:  Short of breath EXAM: PORTABLE CHEST 1 VIEW COMPARISON:  09/03/2021 FINDINGS: Single frontal view of the chest demonstrates persistent enlargement of the cardiac silhouette. No airspace disease, effusion, or pneumothorax. Chronic central vascular prominence. Stable nodular density at the right apex, please see previous CT discussion. No acute bony abnormalities. IMPRESSION: 1. Chronic central vascular congestion.  No acute airspace disease. 2. Stable nodularity at the right apex, please see previous CT chest discussion. Electronically Signed   By: Sharlet Salina M.D.   On: 01/10/2022 20:20    Assessment/Plan Present on Admission:  Acute on chronic respiratory failure with hypoxia (HCC) Acute exacerbation HpEF -  Admit to med telemetry -CHF order set initiated -IV Lasix twice daily -Daily weights, strict I's and O's -Oxygen keep sats greater than 88% -Respiratory to eval and treat -Repeat echo not done, most recent echo 09/04/2021 -Likely secondary to severe mitral valve regurgitation and atrial fibrillation  Acute metabolic encephalopathy -May be secondary to hypoxia -CT head negative for CVA however patient with A-fib and therefore at risk for embolic CVA.  If metabolic workup unrevealing and patient remains confused, consider MRI head.  Patient moves all extremities -Additionally ruling out other etiology, chest x-ray, UA, vitamin B12, folate, RPR, ammonia levels ordered.  Currently no evidence of infection but patient immunosuppressed on Arava and prednisone daily  Hyponatremia -Likely secondary to fluid overload.  Patient with IV Lasix -BMP in a.m.  AKI -Monitor closely, may worse with IV Lasix -BMP in a.m.  Elevated LFT's -Chronic, may be secondary to  congestive liver. -Will order hepatitis panel  Mitral valve regurgitation -Discussion underway re MitraClip procedure.  Patient has a central to the procedure  Lower extremity rash -Unclear etiology.  Patient mostly homebound. -Monitor   HTN (hypertension) -Continue diltiazem and metoprolol   Hyperthyroidism -On methimazole.  Recently restarted after being held for 2 weeks.  Patient thyroid level still quite suppressed.  Will continue to hold methimazole   Rheumatoid arthritis ([place]) -On Arava 10 mg p.o. daily.  Not held,   Pure hypercholesterolemia -Continue omega-3 fatty acids  GERD -Pepcid, probiotics resumed   Paroxysmal atrial fibrillation (HCC) -Continue diltiazem, metoprolol, Eliquis.  First dose of metoprolol to be given now as it is unclear if patient had her medications this morning with her confusion.   Protein-calorie malnutrition, severe -  Dispo: Son interested in having patient transition to SNF for until completed back on her feet. PT/OT consult placed  Lee-Ann Gal 01/10/2022, 11:05 PM

## 2022-01-10 NOTE — ED Notes (Signed)
ED Provider at bedside. 

## 2022-01-11 ENCOUNTER — Inpatient Hospital Stay (HOSPITAL_COMMUNITY): Payer: Medicare PPO

## 2022-01-11 DIAGNOSIS — I480 Paroxysmal atrial fibrillation: Secondary | ICD-10-CM | POA: Diagnosis not present

## 2022-01-11 DIAGNOSIS — I509 Heart failure, unspecified: Secondary | ICD-10-CM | POA: Diagnosis not present

## 2022-01-11 DIAGNOSIS — I5031 Acute diastolic (congestive) heart failure: Secondary | ICD-10-CM | POA: Diagnosis not present

## 2022-01-11 DIAGNOSIS — J9621 Acute and chronic respiratory failure with hypoxia: Secondary | ICD-10-CM | POA: Diagnosis not present

## 2022-01-11 DIAGNOSIS — I341 Nonrheumatic mitral (valve) prolapse: Secondary | ICD-10-CM | POA: Diagnosis not present

## 2022-01-11 DIAGNOSIS — I340 Nonrheumatic mitral (valve) insufficiency: Secondary | ICD-10-CM

## 2022-01-11 LAB — LACTIC ACID, PLASMA
Lactic Acid, Venous: 2.3 mmol/L (ref 0.5–1.9)
Lactic Acid, Venous: 2 mmol/L (ref 0.5–1.9)

## 2022-01-11 LAB — URINALYSIS, ROUTINE W REFLEX MICROSCOPIC
Bilirubin Urine: NEGATIVE
Glucose, UA: NEGATIVE mg/dL
Hgb urine dipstick: NEGATIVE
Ketones, ur: NEGATIVE mg/dL
Leukocytes,Ua: NEGATIVE
Nitrite: NEGATIVE
Protein, ur: NEGATIVE mg/dL
Specific Gravity, Urine: 1.017 (ref 1.005–1.030)
pH: 7 (ref 5.0–8.0)

## 2022-01-11 LAB — ECHOCARDIOGRAM COMPLETE
AR max vel: 1.72 cm2
AV Area VTI: 2.24 cm2
AV Area mean vel: 1.79 cm2
AV Mean grad: 2 mmHg
AV Peak grad: 3.5 mmHg
Ao pk vel: 0.93 m/s
Area-P 1/2: 4.36 cm2
Calc EF: 54.9 %
MV M vel: 4.36 m/s
MV Peak grad: 75.9 mmHg
P 1/2 time: 819 msec
Radius: 1 cm
S' Lateral: 2.9 cm
Single Plane A2C EF: 55.7 %
Single Plane A4C EF: 52.7 %

## 2022-01-11 LAB — CBC WITH DIFFERENTIAL/PLATELET
Abs Immature Granulocytes: 0.04 10*3/uL (ref 0.00–0.07)
Basophils Absolute: 0.1 10*3/uL (ref 0.0–0.1)
Basophils Relative: 1 %
Eosinophils Absolute: 0.1 10*3/uL (ref 0.0–0.5)
Eosinophils Relative: 1 %
HCT: 40.5 % (ref 36.0–46.0)
Hemoglobin: 13.5 g/dL (ref 12.0–15.0)
Immature Granulocytes: 0 %
Lymphocytes Relative: 15 %
Lymphs Abs: 1.6 10*3/uL (ref 0.7–4.0)
MCH: 33.1 pg (ref 26.0–34.0)
MCHC: 33.3 g/dL (ref 30.0–36.0)
MCV: 99.3 fL (ref 80.0–100.0)
Monocytes Absolute: 0.9 10*3/uL (ref 0.1–1.0)
Monocytes Relative: 9 %
Neutro Abs: 7.8 10*3/uL — ABNORMAL HIGH (ref 1.7–7.7)
Neutrophils Relative %: 74 %
Platelets: 184 10*3/uL (ref 150–400)
RBC: 4.08 MIL/uL (ref 3.87–5.11)
RDW: 15.9 % — ABNORMAL HIGH (ref 11.5–15.5)
WBC: 10.4 10*3/uL (ref 4.0–10.5)
nRBC: 0 % (ref 0.0–0.2)

## 2022-01-11 LAB — T4, FREE: Free T4: 1.28 ng/dL — ABNORMAL HIGH (ref 0.61–1.12)

## 2022-01-11 LAB — CBG MONITORING, ED
Glucose-Capillary: 115 mg/dL — ABNORMAL HIGH (ref 70–99)
Glucose-Capillary: 154 mg/dL — ABNORMAL HIGH (ref 70–99)
Glucose-Capillary: 157 mg/dL — ABNORMAL HIGH (ref 70–99)
Glucose-Capillary: 37 mg/dL — CL (ref 70–99)

## 2022-01-11 LAB — VITAMIN B12: Vitamin B-12: 2870 pg/mL — ABNORMAL HIGH (ref 180–914)

## 2022-01-11 LAB — PROCALCITONIN: Procalcitonin: <0.1 ng/mL

## 2022-01-11 LAB — MAGNESIUM: Magnesium: 2 mg/dL (ref 1.7–2.4)

## 2022-01-11 LAB — FOLATE: Folate: 33.5 ng/mL (ref >5.9)

## 2022-01-11 LAB — GLUCOSE, CAPILLARY: Glucose-Capillary: 189 mg/dL — ABNORMAL HIGH (ref 70–99)

## 2022-01-11 LAB — AMMONIA: Ammonia: 17 umol/L (ref 9–35)

## 2022-01-11 LAB — RPR: RPR Ser Ql: NONREACTIVE

## 2022-01-11 MED ORDER — SODIUM CHLORIDE 0.9 % IV SOLN: 250.0000 mL | INTRAVENOUS | Status: DC | PRN

## 2022-01-11 MED ORDER — INSULIN ASPART 100 UNIT/ML IJ SOLN
0.0000 [IU] | Freq: Three times a day (TID) | INTRAMUSCULAR | Status: DC
  Administered 2022-01-11: 3 [IU] via SUBCUTANEOUS
  Filled 2022-01-11: qty 0.15

## 2022-01-11 MED ORDER — ONDANSETRON HCL 4 MG/2ML IJ SOLN: 4.0000 mg | Freq: Four times a day (QID) | INTRAMUSCULAR | Status: DC | PRN

## 2022-01-11 MED ORDER — DEXTROSE 50 % IV SOLN
INTRAVENOUS | Status: AC
  Administered 2022-01-11: 50 mL via INTRAVENOUS
  Filled 2022-01-11: qty 50

## 2022-01-11 MED ORDER — FUROSEMIDE 10 MG/ML IJ SOLN
40.0000 mg | Freq: Two times a day (BID) | INTRAMUSCULAR | Status: DC
  Administered 2022-01-11: 40 mg via INTRAVENOUS
  Filled 2022-01-11: qty 4

## 2022-01-11 MED ORDER — ACETAMINOPHEN 325 MG PO TABS: 650.0000 mg | ORAL_TABLET | ORAL | Status: DC | PRN

## 2022-01-11 MED ORDER — SODIUM CHLORIDE 0.9% FLUSH: 3.0000 mL | INTRAVENOUS | Status: DC | PRN

## 2022-01-11 MED ORDER — FUROSEMIDE 10 MG/ML IJ SOLN
80.0000 mg | Freq: Two times a day (BID) | INTRAMUSCULAR | Status: DC
  Administered 2022-01-11 – 2022-01-16 (×9): 80 mg via INTRAVENOUS
  Filled 2022-01-11 (×9): qty 8

## 2022-01-11 MED ORDER — SODIUM CHLORIDE 0.9% FLUSH
3.0000 mL | Freq: Two times a day (BID) | INTRAVENOUS | Status: DC
  Administered 2022-01-11 – 2022-01-16 (×8): 3 mL via INTRAVENOUS

## 2022-01-11 MED ORDER — LEFLUNOMIDE 10 MG PO TABS
10.0000 mg | ORAL_TABLET | Freq: Every morning | ORAL | Status: DC
  Administered 2022-01-11 – 2022-01-16 (×6): 10 mg via ORAL
  Filled 2022-01-11 (×6): qty 1

## 2022-01-11 MED ORDER — SPIRONOLACTONE 12.5 MG HALF TABLET
12.5000 mg | ORAL_TABLET | Freq: Every day | ORAL | Status: DC
  Administered 2022-01-12 – 2022-01-16 (×4): 12.5 mg via ORAL
  Filled 2022-01-11 (×6): qty 1

## 2022-01-11 MED ORDER — INSULIN ASPART 100 UNIT/ML IJ SOLN
0.0000 [IU] | INTRAMUSCULAR | Status: DC
  Administered 2022-01-11 – 2022-01-14 (×6): 1 [IU] via SUBCUTANEOUS
  Administered 2022-01-15: 3 [IU] via SUBCUTANEOUS
  Administered 2022-01-15: 1 [IU] via SUBCUTANEOUS
  Filled 2022-01-11: qty 0.06

## 2022-01-11 MED ORDER — INSULIN ASPART 100 UNIT/ML IJ SOLN
0.0000 [IU] | Freq: Every day | INTRAMUSCULAR | Status: DC
  Filled 2022-01-11: qty 0.05

## 2022-01-11 MED ORDER — DEXTROSE 50 % IV SOLN: 1.0000 | Freq: Once | INTRAVENOUS | Status: AC

## 2022-01-11 NOTE — ED Notes (Signed)
Pt placed on NRB d/t desat below 85% while sleeping and inability to keep [place] in place. O2 sat sifficult to obtain consistent, accurate reading d/t pt movement and poor circulation. Attempted to find forehead oxygen sensor machine but unable. Have places O2 sensor on finger, toe, forehead, and ear. O2 appears to maintain greater than 88% per care plan.

## 2022-01-11 NOTE — Progress Notes (Signed)
OT Cancellation Note  Patient Details Name: Emily Robertson MRN: [id number] DOB: 1930-03-12   Cancelled Treatment:    Reason Eval/Treat Not Completed: Medical issues which prohibited therapy. RN requesting we hold therapy eval; pt reporting SOB while supine in bed, on supplemental O2 and SpO2 varying, wanting respiratory to see pt. Will check back as appropriate.   Reuben Likes, OTR/L 01/11/2022, 11:49 AM

## 2022-01-11 NOTE — Consult Note (Addendum)
Cardiology Consultation   Emily Robertson ID: [name] MRN: [id number]; DOB: 12/27/1930  Admit date: 01/10/2022 Date of Consult: 01/11/2022  PCP:  Emily Robertson, [facility] Cardiologist:  Emily Ruths, MD        Emily Robertson Profile:   Emily Robertson is a 88 y.o. female with a hx of HTN, HLD, likely severe MR, persistent atrial fibrillation, hyperthyroidism and rheumatoid arthritis who is being seen 01/11/2022 for the evaluation of dyspnea at the request of Emily Robertson.  History of Present Illness:   Emily Robertson is a 88 year old female with past medical history of HTN, HLD, likely severe MR, persistent atrial fibrillation, hyperthyroidism and rheumatoid arthritis.  Emily Robertson was first diagnosed with atrial fibrillation with RVR after a fall in June 2023.  Emily Robertson had acute impacted fracture of the right superior pubic rami and acute plastic fracture of the left anterior pubic rami.  High-sensitivity troponin was up to 260 before trending back down.  Emily Robertson was placed on Toprol-XL and Cardizem for rate control. Echocardiogram obtained on 07/25/2021 showed EF 60 to 65%, no regional wall motion abnormality, severe LAE, moderate to severe MR and mild AI.  Emily Robertson was started on low-dose Eliquis.  Emily Robertson was readmitted in July 2023 with worsening dyspnea and orthopnea related to CHF exacerbation.  Emily Robertson underwent IV diuresis.  Repeat echocardiogram obtained on 09/04/2021 showed EF 50 to 55%, moderate LVH, severe biatrial enlargement, moderate to severe MR with prolapse of the posterior leaflet.  Emily Robertson underwent TEE by Emily Robertson on 09/05/2021 which revealed mitral valve with ruptured chordae to the P2 scallop of posterior leaflet, little motion with relatively short segment of P2 scallop, moderate to severe MR, mild TR.  MitraClip workup was recommended.  Emily Robertson was ultimately discharged on 40 mg daily of Lasix.  Due to elevation of the LFT, Lipitor was discontinued.  Cardioversion was not  going to be considered until her hyperthyroidism can be under good control.  Emily Robertson was evaluated by Emily Robertson for MitraClip on 09/18/2021 who felt her anatomy was very good for MitraClip therapy.  However Emily Robertson was hesitant to go through with the MitraClip.  I last saw the Emily Robertson on 12/17/2021 at which time Emily Robertson remained in rate controlled atrial fibrillation at that time with heart rate in the 70s.  We discussed the possibility of proceeding with MitraClip procedure, however Emily Robertson was hesitant to do so.  I discussed with both the Emily Robertson and Emily Robertson regarding whether or not to leave her in A-fib versus cardioversion, given the fact that Emily Robertson has no cardiac awareness of A-fib, we were not confident that bring her out of A-fib is going to change her overall condition or improve her quality of life.  We ultimately decided to proceed with rate control strategy.  I did mention to the Emily Robertson that without MitraClip, I suspect Emily Robertson will have recurrence of her heart failure in the future.  Emily Robertson presented to Emily Robertson on 01/10/2022 from independent living side of Emily Robertson with confusion and hypoxia.  According to the Emily Robertson's son, Emily Robertson has been complaining of more shortness of breath over the past few weeks.  Because Emily Robertson lives in the independent side, therefore Emily Robertson managed her own medication.  Her ability to take her medication in the recent weeks has been spotty as son has noticed occasional left over medications.  Based on admission note, Emily Robertson complained of shortness of breath and unable to lay flat to sleep yesterday.  O2  saturation on room air was only 80%.  Emily Robertson was placed on 3 L oxygen to keep O2 saturation greater than 88%.  Initial BNP was 1851.  CT of the chest shows no PE, stable bilateral pleural effusion less than 1 L each, groundglass nodularity in the right apex measuring up to 1.5 cm unchanged when compared to the previous study, no new airspace disease, no pneumothorax.  Significant blood  work included sodium 131, creatinine 1.28 which is higher than her baseline of 0.9.  AST and ALT were both elevated, AST 114, ALT 147.  White blood cell 12.0.  TSH 10.5, free T41.2.  Viral panel was negative for influenza or COVID.  Chest x-ray showed chronic central vascular congestion, no acute airspace disease.  EKG showed atrial fibrillation with heart rate in the 90s.  Cardiology service consulted for possible heart failure.  At the time my interview, Emily Robertson is on nonrebreather with O2 saturation in the high 80s.   Past Medical History:  Diagnosis Date   Arthritis    Glaucoma of both eyes    Hyperlipidemia    Hypertension    Pelvic prolapse    anterior floor   Urgency of urination    Urinary hesitancy    Urticaria     Past Surgical History:  Procedure Laterality Date   PUBOVAGINAL SLING N/A 08/31/2012   Procedure: BOSTON SCIENTIFIC UPHOLD LIGHT [place];  Surgeon: Ailene Rud, MD;  Location: [facility];  Service: Urology;  Laterality: N/A;   RETINAL DETACHMENT SURGERY Right    TEE WITHOUT CARDIOVERSION N/A 09/05/2021   Procedure: TRANSESOPHAGEAL ECHOCARDIOGRAM (TEE);  Surgeon: Sanda Klein, MD;  Location: [facility] ENDOSCOPY;  Service: Cardiovascular;  Laterality: N/A;     Robertson Medications:  Prior to Admission medications   Medication Sig Start Date End Date Taking? Authorizing Provider  alendronate (FOSAMAX) 70 MG tablet Take 70 mg by mouth every Wednesday. 08/10/14  Yes [provider]  apixaban (ELIQUIS) 2.5 MG TABS tablet Take 1 tablet (2.5 mg total) by mouth 2 (two) times daily. 07/31/21  Yes Georgette Shell, MD  Ascorbic Acid (VITAMIN C PO) Take 1 tablet by mouth every morning.   Yes [provider]  atorvastatin (LIPITOR) 20 MG tablet Take 20 mg by mouth daily. 12/14/21  Yes [provider]  CALCIUM PO Take 1 tablet by mouth daily with supper.   Yes [provider]  cetirizine (ZYRTEC) 10 MG tablet Take 1  tablet (10 mg total) by mouth 2 (two) times daily. 10/28/19  Yes Padgett, Rae Halsted, MD  Cholecalciferol (VITAMIN D3 PO) Take 1,000 Units by mouth daily with supper.   Yes [provider]  diltiazem (CARDIZEM CD) 180 MG 24 hr capsule Take 1 capsule (180 mg total) by mouth daily. 08/01/21  Yes Georgette Shell, MD  EPINEPHrine (EPIPEN) 0.3 mg/0.3 mL IJ SOAJ injection Inject 0.3 mg into the muscle once as needed for anaphylaxis. 09/06/21  Yes Hongalgi, Lenis Dickinson, MD  famotidine (PEPCID) 20 MG tablet Take 1 tablet (20 mg total) by mouth 2 (two) times daily. 10/28/19  Yes Padgett, Rae Halsted, MD  feeding supplement (ENSURE ENLIVE / ENSURE PLUS) LIQD Take 237 mLs by mouth 2 (two) times daily between meals. 09/07/21  Yes [provider]  furosemide (LASIX) 40 MG tablet Take 1 tablet (40 mg total) by mouth daily. 09/07/21  Yes Hongalgi, Lenis Dickinson, MD  latanoprost (XALATAN) 0.005 % ophthalmic solution Place 1 drop into the left eye at bedtime.  09/14/19  Yes [provider]  leflunomide (ARAVA) 10 MG tablet Take 10 mg by mouth every morning. 07/24/21  Yes [provider]  methimazole (TAPAZOLE) 10 MG tablet Take 1 tablet (10 mg total) by mouth 2 (two) times daily. Emily Robertson taking differently: Take 5 mg by mouth daily. 07/31/21  Yes Georgette Shell, MD  metoprolol succinate (TOPROL-XL) 100 MG 24 hr tablet Take 1.5 tablets (150 mg total) by mouth every morning. 09/07/21  Yes Hongalgi, Lenis Dickinson, MD  montelukast (SINGULAIR) 10 MG tablet Take 1 tablet (10 mg total) by mouth daily. Emily Robertson taking differently: Take 10 mg by mouth every evening. 12/06/19  Yes Padgett, Rae Halsted, MD  Multiple Vitamin (MULTIVITAMIN WITH MINERALS) TABS tablet Take 1 tablet by mouth every morning.   Yes [provider]  Omega-3 Fatty Acids (FISH OIL) 1000 MG CAPS Take 1,000 mg by mouth every morning.   Yes [provider]  predniSONE (DELTASONE) 2.5 MG tablet Take 1 tablet  (2.5 mg total) by mouth daily with breakfast. 08/01/21  Yes Georgette Shell, MD  Probiotic Product (PROBIOTIC DAILY PO) Take 1 capsule by mouth daily with supper.   Yes [provider]  timolol (TIMOPTIC) 0.5 % ophthalmic solution Place 1 drop into both eyes 2 (two) times daily.   Yes [provider]    Inpatient Medications: Scheduled Meds:  apixaban  2.5 mg Oral BID   diltiazem  180 mg Oral Daily   famotidine  20 mg Oral BID   feeding supplement  237 mL Oral TID BM   furosemide  40 mg Intravenous Q12H   insulin aspart  0-15 Units Subcutaneous TID WC   insulin aspart  0-5 Units Subcutaneous QHS   leflunomide  10 mg Oral q morning   metoprolol succinate  150 mg Oral q morning   montelukast  10 mg Oral Daily   predniSONE  2.5 mg Oral Q breakfast   sodium chloride flush  3 mL Intravenous Q12H   Continuous Infusions:  sodium chloride     PRN Meds: sodium chloride, acetaminophen, ondansetron (ZOFRAN) IV, sodium chloride flush  Allergies:    Allergies  Allergen Reactions   Cozaar [Losartan] Swelling    Tongue swelling   Erythromycin Itching   Azopt [Brinzolamide] Rash    Rash on eyelid    Social History:   Social History   Socioeconomic History   Marital status: Widowed    Spouse name: Not on file   Number of children: Not on file   Years of education: Not on file   Highest education level: Not on file  Occupational History   Not on file  Tobacco Use   Smoking status: Never   Smokeless tobacco: Never  Vaping Use   Vaping Use: Never used  Substance and Sexual Activity   Alcohol use: Not Currently    Comment: rare   Drug use: No   Sexual activity: Not on file  Other Topics Concern   Not on file  Social History Narrative   Not on file   Social Determinants of Health   Financial Resource Strain: Not on file  Food Insecurity: Not on file  Transportation Needs: Not on file  Physical Activity: Not on file  Stress: Not on file  Social  Connections: Not on file  Intimate Partner Violence: Not on file    Family History:    Family History  Problem Relation Age of Onset   Hypertension Mother    Thyroid disease Neg  Hx    Diabetes Neg Hx      ROS:  Please see the history of present illness.   All other ROS reviewed and negative.     Physical Exam/Data:   Vitals:   01/11/22 0554 01/11/22 0820 01/11/22 0926 01/11/22 0930  BP: 132/87  (!) 123/96 (!) 123/96  Pulse: 81   97  Resp: (!) 23   (!) 22  Temp:  98.3 F (36.8 C)    TempSrc:  Oral    SpO2: 94%   90%    Intake/Output Summary (Last 24 hours) at 01/11/2022 1113 Last data filed at 01/11/2022 0930 Gross per 24 hour  Intake 3 ml  Output --  Net 3 ml      12/17/2021    3:12 PM 10/09/2021    2:01 PM 09/21/2021    2:26 PM  Last 3 Weights  Weight (lbs) 101 lb 101 lb 6.4 oz 102 lb 6.4 oz  Weight (kg) 45.813 kg 45.995 kg 46.448 kg     There is no height or weight on file to calculate BMI.  General: Dyspneic, on nonrebreather, somewhat confused HEENT: normal Neck: no JVD Vascular: No carotid bruits; Distal pulses 2+ bilaterally Cardiac: Irregular, 3 out of 6 heart murmur Lungs:  clear to auscultation bilaterally, no wheezing, rhonchi or rales  Abd: soft, nontender, no hepatomegaly  Ext: no edema Musculoskeletal:  No deformities, BUE and BLE strength normal and equal Skin: warm and dry  Neuro:  CNs 2-12 intact, no focal abnormalities noted Psych:  Normal affect   EKG:  The EKG was personally reviewed and demonstrates: Atrial fibrillation, with heart rate in the 90s. Telemetry:  Telemetry was personally reviewed and demonstrates: Atrial fibrillation, heart rate in the 90s to 100.  Relevant CV Studies:  TEE 09/05/2021  1. Left ventricular ejection fraction, by estimation, is 65 to 70%. The  left ventricle has normal function. The left ventricle has no regional  wall motion abnormalities. Left ventricular diastolic function could not  be evaluated.   2.  Right ventricular systolic function is normal. The right ventricular  size is normal.   3. Left atrial size was severely dilated. No left atrial/left atrial  appendage thrombus was detected. The LAA emptying velocity was 27 cm/s.   4. Right atrial size was mildly dilated.   5. A single ruptured chorda tendina is seen, attached to the mid portion  of P2 (middle) scallop of the posterior leaflet. There is a fairly short  arc of the P2 scallop with flail motion. Flail gap 5 mm, flail width 9 mm.  Posterior leaflet length 15 mm,  valve area 6 cm sq, mean gradient 2 mm Hg at 111 bpm. MR 2D vena contracta  is 5 mm. 3D guided effective regurgitant orifice area measures 0.34 cm sq,  regurgitant volume 45 mL. There is blunted, but antegrade systolic flow in  the right and left pulmonary  veins. Due to atrial fibrillation, unable to accurately assess via PISA  method. The mitral valve is myxomatous. Moderate to severe mitral valve  regurgitation. No evidence of mitral stenosis.   6. The tricuspid valve is myxomatous.   7. The aortic valve is tricuspid. Aortic valve regurgitation is mild.   8. There is mild (Grade II) plaque involving the ascending aorta.    Laboratory Data:  High Sensitivity Troponin:   Recent Labs  Lab 01/10/22 1941 01/10/22 2156  TROPONINIHS 12 11     Chemistry Recent Labs  Lab 01/10/22 1941 01/11/22  0401  NA 131*  --   K 4.2  --   CL 95*  --   CO2 23  --   GLUCOSE 158*  --   BUN 40*  --   CREATININE 1.28*  --   CALCIUM 8.9  --   MG  --  2.0  GFRNONAA 40*  --   ANIONGAP 13  --     Recent Labs  Lab 01/10/22 1941  PROT 6.2*  ALBUMIN 3.7  AST 114*  ALT 147*  ALKPHOS 230*  BILITOT 1.3*   Lipids No results for input(s): "CHOL", "TRIG", "HDL", "LABVLDL", "LDLCALC", "CHOLHDL" in the last 168 hours.  Hematology Recent Labs  Lab 01/10/22 1941 01/11/22 0401  WBC 12.0* 10.4  RBC 4.30 4.08  HGB 14.3 13.5  HCT 43.2 40.5  MCV 100.5* 99.3  MCH 33.3 33.1   MCHC 33.1 33.3  RDW 16.2* 15.9*  PLT 198 184   Thyroid  Recent Labs  Lab 01/10/22 1941 01/11/22 0111  TSH 10.519*  --   FREET4  --  1.28*    BNP Recent Labs  Lab 01/10/22 1941  BNP 1,851.3*    DDimer No results for input(s): "DDIMER" in the last 168 hours.   Radiology/Studies:  CT Chest W Contrast  Result Date: 01/10/2022 CLINICAL DATA:  Altered level of consciousness, fatigue, short of breath, hypoxia EXAM: CT CHEST WITH CONTRAST TECHNIQUE: Multidetector CT imaging of the chest was performed during intravenous contrast administration. RADIATION DOSE REDUCTION: This exam was performed according to the departmental dose-optimization program which includes automated exposure control, adjustment of the mA and/or kV according to Emily Robertson size and/or use of iterative reconstruction technique. CONTRAST:  30mL OMNIPAQUE IOHEXOL 300 MG/ML  SOLN COMPARISON:  01/10/2022, 09/03/2021, 01/12/2021 FINDINGS: Cardiovascular: The heart is enlarged with prominent biatrial dilatation. No pericardial effusion. No pulmonary emboli. No evidence of thoracic aortic aneurysm or dissection. Atherosclerosis of the aorta. Mediastinum/Nodes: No enlarged mediastinal, hilar, or axillary lymph nodes. Thyroid gland, trachea, and esophagus demonstrate no significant findings. Lungs/Pleura: There are persistent bilateral pleural effusions less than 1 L each. Ground-glass nodularity at the right apex measuring up to 1.5 cm is unchanged since prior exam, reference image 17/6. No new airspace disease. No pneumothorax. Stable background emphysema. Central airways are patent. Upper Abdomen: Reflux of contrast into the hepatic veins suggesting cardiac dysfunction. No acute upper abdominal findings. Musculoskeletal: No acute or destructive bony lesions. Reconstructed images demonstrate no additional findings. IMPRESSION: 1. Stable bilateral pleural effusions. 2. Stable ground-glass nodularity at the right apex, unchanged since  01/12/2021. Follow-up CT is recommended every 2 years until 5 years of stability has been established. This recommendation follows the consensus statement: Guidelines for Management of Incidental Pulmonary Nodules Detected on CT Images: From the Fleischner Society 2017; Radiology 2017; 284:228-243. 3. No evidence of pulmonary embolus. 4. Cardiomegaly with prominent biatrial dilatation. 5.  Aortic Atherosclerosis (ICD10-I70.0). Electronically Signed   By: Randa Ngo M.D.   On: 01/10/2022 22:24   CT HEAD WO CONTRAST (5MM)  Result Date: 01/10/2022 CLINICAL DATA:  Mental status change, unknown cause EXAM: CT HEAD WITHOUT CONTRAST TECHNIQUE: Contiguous axial images were obtained from the base of the skull through the vertex without intravenous contrast. RADIATION DOSE REDUCTION: This exam was performed according to the departmental dose-optimization program which includes automated exposure control, adjustment of the mA and/or kV according to Emily Robertson size and/or use of iterative reconstruction technique. COMPARISON:  07/25/2021 FINDINGS: Brain: There is periventricular white matter decreased attenuation consistent with small  vessel ischemic changes. Ventricles, sulci and cisterns are prominent consistent with age related involutional changes. No acute intracranial hemorrhage, mass effect or shift. No hydrocephalus. Encephalomalacia identified consistent with chronic bilateral basal ganglia lacunar CVAs. Vascular: No hyperdense vessel or unexpected calcification. Skull: Normal. Negative for fracture or focal lesion. Sinuses/Orbits: No acute finding. IMPRESSION: Atrophy and chronic small vessel ischemic changes. Chronic lacunar CVAs bilateral basal ganglia. No acute intracranial process identified. Electronically Signed   By: Sammie Bench M.D.   On: 01/10/2022 21:42   DG Chest Port 1 View  Result Date: 01/10/2022 CLINICAL DATA:  Short of breath EXAM: PORTABLE CHEST 1 VIEW COMPARISON:  09/03/2021 FINDINGS:  Single frontal view of the chest demonstrates persistent enlargement of the cardiac silhouette. No airspace disease, effusion, or pneumothorax. Chronic central vascular prominence. Stable nodular density at the right apex, please see previous CT discussion. No acute bony abnormalities. IMPRESSION: 1. Chronic central vascular congestion.  No acute airspace disease. 2. Stable nodularity at the right apex, please see previous CT chest discussion. Electronically Signed   By: Randa Ngo M.D.   On: 01/10/2022 20:20     Assessment and Plan:   Acute respiratory failure  - Per Emily Robertson's family, Emily Robertson has been somewhat short of breath for the past several weeks.  Came in yesterday due to confusion and worsening dyspnea.  Emily Robertson has moderate to severe mitral regurgitation and was previously evaluated for MitraClip.  Emily Robertson has been receiving IV diuretic since last night, despite so, O2 saturation had appears to have come up.  Emily Robertson is cachectic, therefore making volume assessment somewhat challenging.  Emily Robertson does not have any lower extremity edema, Emily Robertson did not have significant crackles on lung exam either.  CT of the chest shows no PE or acute process, Emily Robertson does have pleural effusion and stable nodularity at the right apex.  Therefore, it is puzzling why Emily Robertson is so short of breath and confused.  When Emily Robertson first arrived, Emily Robertson was on 2 L nasal cannula, by the time I evaluated her, Emily Robertson is on nonrebreather.  O2 saturations in the 80s.  COVID test and influenza test came back negative.  I will order a procalcitonin and lactic acid to make sure Emily Robertson is not septic.     Addendum: discussed with Dr. Audie Box who has also seen the Emily Robertson, will uptitrate diuretic. Felt Emily Robertson is really volume overloaded with dilated external jugular vein. Will obtain echocardiogram.    Acute diastolic heart failure: As mentioned above, volume assessment is challenging. I/O not accurate.  Weight is not documented.  Emily Robertson is on 40 mg oral Lasix at Robertson, however  family mentions Emily Robertson may not have missed her medication intermittently as they have found leftover medications.  Will need strict I/O  Moderate to severe mitral regurgitation: Evaluated by Emily Robertson in August for possible MitraClip, Emily Robertson was felt to be a good candidate, however Emily Robertson was hesitant to proceed.  Persistent atrial fibrillation: Emily Robertson has been in atrial fibrillation since June.  Initially it was recommended to delay cardioversion until her hyperthyroidism can be corrected, however because the Emily Robertson is asymptomatic while in A-fib, we ultimately decided to proceed with rate control strategy.  Hyperthyroidism: Improving in the past 5 months.  Hypertension: On diltiazem and metoprolol mainly for rate control.  Hyperlipidemia: Hold Lipitor given elevated transaminases.    Risk Assessment/Risk Scores:        New York Heart Association (NYHA) Functional Class NYHA Class IV  CHA2DS2-VASc Score = 6   This indicates  a 9.7% annual risk of stroke. The Emily Robertson's score is based upon: CHF History: 1 HTN History: 1 Diabetes History: 0 Stroke History: 0 Vascular Disease History: 1 Age Score: 2 Gender Score: 1         For questions or updates, please contact [facility] Please consult [email or web address] for contact info under    Ramond Dial, PA  01/11/2022 11:13 AM

## 2022-01-11 NOTE — ED Notes (Signed)
Ate a few bites of dinner with 2 sips of protein shake. Some meatloaf and pears.

## 2022-01-11 NOTE — Progress Notes (Signed)
PROGRESS NOTE    Emily Robertson  [id number] DOB: 18-Sep-1930 DOA: 01/10/2022 PCP: [name], FNP    Brief Narrative:  88 year old female with history of essential hypertension and dyslipidemia who recently moved to independent living who was found confused and hypoxemic so brought to ER.  In the emergency room noted to be 80% on room air needing 3 L of oxygen.  BNP 1800.  Cardiac enzymes negative.  Skeletal survey negative.  Admitted due to hypoxemia, altered mental status.   Assessment & Plan:   Acute hypoxemic respiratory failure, suspected acute diastolic heart failure exacerbation.  Severe mitral regurgitation probably causing valvular heart disease and fluid overload. Currently on IV Lasix, intake output monitoring. Echocardiogram was more than 3 months ago with diastolic dysfunction.  We will repeat echocardiogram today. Patient on Cardizem, metoprolol.  She will not tolerate further medical therapy for heart failure.  We will consult cardiology.  Paroxysmal atrial fibrillation: Currently in A-fib.  Rate controlled on metoprolol and Cardizem.  Therapeutic on Eliquis.  Acute metabolic encephalopathy: Likely secondary to #1. CT head was normal.  CT angiogram without evidence of pneumonia or pulmonary embolism. Urinalysis is clear, [id number] folic acid levels are normal.  Ammonia is normal. Mental status is clearing up. TSH 10,   Mitral valve regurgitation: May be causing fluid overload.  She is currently followed at cardiology office for consideration of MitraClip procedure.  will ask cardiology to consult.  Chronic medical issues including Essential hypertension: Stable on Cardizem metoprolol. Hypothyroidism: TSH is 10, just started On methimazole. Rheumatoid arthritis: On Arava. GERD: On Pepcid.  Work with PT OT today.  Echocardiogram today.   DVT prophylaxis: SCDs Start: 01/11/22 0006 apixaban (ELIQUIS) tablet 2.5 mg Start: 01/10/22 2345 apixaban (ELIQUIS) tablet  2.5 mg   Code Status: Full Code  Family Communication: Son on the phone Disposition Plan: Status is: Inpatient Remains inpatient appropriate because: significant SOB      Consultants:  Cardiology Palliative care   Procedures:  None  Antimicrobials:  None     Subjective: Patient seen and examined. She denies any complaints.  Alert awake.  Very frail and debilitated.  Rate controlled A-fib.  Looks dry.  I offered a water, she told me she will drink with breakfast.  Objective: Vitals:   01/11/22 0554 01/11/22 0820 01/11/22 0926 01/11/22 0930  BP: 132/87  (!) 123/96 (!) 123/96  Pulse: 81   97  Resp: (!) 23   (!) 22  Temp:  98.3 F (36.8 C)    TempSrc:  Oral    SpO2: 94%   90%    Intake/Output Summary (Last 24 hours) at 01/11/2022 1009 Last data filed at 01/11/2022 0930 Gross per 24 hour  Intake 3 ml  Output --  Net 3 ml   There were no vitals filed for this visit.  Examination:  General exam: Appears calm and comfortable  Cachectic.  Frail and debilitated.  Pale. Respiratory system: Clear to auscultation.  No added sounds. Cardiovascular system: S1 & S2 heard, irregularly irregular. No pedal edema. Gastrointestinal system: Abdomen is nondistended, soft and nontender. No organomegaly or masses felt. Normal bowel sounds heard. Central nervous system: Alert and oriented.  Mostly oriented.  No focal neurological deficits.  Moves all extremities.    Data Reviewed: I have personally reviewed following labs and imaging studies  CBC: Recent Labs  Lab 01/10/22 1941 01/11/22 0401  WBC 12.0* 10.4  NEUTROABS 9.5* 7.8*  HGB 14.3 13.5  HCT 43.2 40.5  MCV  100.5* 99.3  PLT 198 [id number]   Basic Metabolic Panel: Recent Labs  Lab 01/10/22 1941 01/11/22 0401  NA 131*  --   K 4.2  --   CL 95*  --   CO2 23  --   GLUCOSE 158*  --   BUN 40*  --   CREATININE 1.28*  --   CALCIUM 8.9  --   MG  --  2.0   GFR: CrCl cannot be calculated (Unknown ideal weight.). Liver  Function Tests: Recent Labs  Lab 01/10/22 1941  AST 114*  ALT 147*  ALKPHOS 230*  BILITOT 1.3*  PROT 6.2*  ALBUMIN 3.7   No results for input(s): "LIPASE", "AMYLASE" in the last 168 hours. Recent Labs  Lab 01/11/22 0656  AMMONIA 17   Coagulation Profile: No results for input(s): "INR", "PROTIME" in the last 168 hours. Cardiac Enzymes: No results for input(s): "CKTOTAL", "CKMB", "CKMBINDEX", "TROPONINI" in the last 168 hours. BNP (last 3 results) No results for input(s): "PROBNP" in the last 8760 hours. HbA1C: No results for input(s): "HGBA1C" in the last 72 hours. CBG: Recent Labs  Lab 01/11/22 0820  GLUCAP 115*   Lipid Profile: No results for input(s): "CHOL", "HDL", "LDLCALC", "TRIG", "CHOLHDL", "LDLDIRECT" in the last 72 hours. Thyroid Function Tests: Recent Labs    01/10/22 1941 01/11/22 0111  TSH 10.519*  --   FREET4  --  1.28*   Anemia Panel: Recent Labs    01/11/22 0401  VITAMINB12 2,870*  FOLATE 33.5   Sepsis Labs: No results for input(s): "PROCALCITON", "LATICACIDVEN" in the last 168 hours.  Recent Results (from the past 240 hour(s))  Resp Panel by RT-PCR (Flu A&B, Covid) Anterior Nasal Swab     Status: None   Collection Time: 01/10/22  7:41 PM   Specimen: Anterior Nasal Swab  Result Value Ref Range Status   SARS Coronavirus 2 by RT PCR NEGATIVE NEGATIVE Final    Comment: (NOTE) SARS-CoV-2 target nucleic acids are NOT DETECTED.  The SARS-CoV-2 RNA is generally detectable in upper respiratory specimens during the acute phase of infection. The lowest concentration of SARS-CoV-2 viral copies this assay can detect is 138 copies/mL. A negative result does not preclude SARS-Cov-2 infection and should not be used as the sole basis for treatment or other patient management decisions. A negative result may occur with  improper specimen collection/handling, submission of specimen other than nasopharyngeal swab, presence of viral mutation(s) within  the areas targeted by this assay, and inadequate number of viral copies(<138 copies/mL). A negative result must be combined with clinical observations, patient history, and epidemiological information. The expected result is Negative.  Fact Sheet for Patients:  EntrepreneurPulse.com.au  Fact Sheet for Healthcare Providers:  IncredibleEmployment.be  This test is no t yet approved or cleared by the [place] FDA and  has been authorized for detection and/or diagnosis of SARS-CoV-2 by FDA under an Emergency Use Authorization (EUA). This EUA will remain  in effect (meaning this test can be used) for the duration of the COVID-19 declaration under Section 564(b)(1) of the Act, 21 U.S.C.section 360bbb-3(b)(1), unless the authorization is terminated  or revoked sooner.       Influenza A by PCR NEGATIVE NEGATIVE Final   Influenza B by PCR NEGATIVE NEGATIVE Final    Comment: (NOTE) The Xpert Xpress SARS-CoV-2/FLU/RSV plus assay is intended as an aid in the diagnosis of influenza from Nasopharyngeal swab specimens and should not be used as a sole basis for treatment. Nasal washings and aspirates  are unacceptable for Xpert Xpress SARS-CoV-2/FLU/RSV testing.  Fact Sheet for Patients: BloggerCourse.com  Fact Sheet for Healthcare Providers: SeriousBroker.it  This test is not yet approved or cleared by the [place] FDA and has been authorized for detection and/or diagnosis of SARS-CoV-2 by FDA under an Emergency Use Authorization (EUA). This EUA will remain in effect (meaning this test can be used) for the duration of the COVID-19 declaration under Section 564(b)(1) of the Act, 21 U.S.C. section 360bbb-3(b)(1), unless the authorization is terminated or revoked.  Performed at [facility], [address] [place], [place] [address]          Radiology Studies: CT Chest  W Contrast  Result Date: 01/10/2022 CLINICAL DATA:  Altered level of consciousness, fatigue, short of breath, hypoxia EXAM: CT CHEST WITH CONTRAST TECHNIQUE: Multidetector CT imaging of the chest was performed during intravenous contrast administration. RADIATION DOSE REDUCTION: This exam was performed according to the departmental dose-optimization program which includes automated exposure control, adjustment of the mA and/or kV according to patient size and/or use of iterative reconstruction technique. CONTRAST:  30mL OMNIPAQUE IOHEXOL 300 MG/ML  SOLN COMPARISON:  01/10/2022, 09/03/2021, 01/12/2021 FINDINGS: Cardiovascular: The heart is enlarged with prominent biatrial dilatation. No pericardial effusion. No pulmonary emboli. No evidence of thoracic aortic aneurysm or dissection. Atherosclerosis of the aorta. Mediastinum/Nodes: No enlarged mediastinal, hilar, or axillary lymph nodes. Thyroid gland, trachea, and esophagus demonstrate no significant findings. Lungs/Pleura: There are persistent bilateral pleural effusions less than 1 L each. Ground-glass nodularity at the right apex measuring up to 1.5 cm is unchanged since prior exam, reference image 17/6. No new airspace disease. No pneumothorax. Stable background emphysema. Central airways are patent. Upper Abdomen: Reflux of contrast into the hepatic veins suggesting cardiac dysfunction. No acute upper abdominal findings. Musculoskeletal: No acute or destructive bony lesions. Reconstructed images demonstrate no additional findings. IMPRESSION: 1. Stable bilateral pleural effusions. 2. Stable ground-glass nodularity at the right apex, unchanged since 01/12/2021. Follow-up CT is recommended every 2 years until 5 years of stability has been established. This recommendation follows the consensus statement: Guidelines for Management of Incidental Pulmonary Nodules Detected on CT Images: From the Fleischner Society 2017; Radiology 2017; 284:228-243. 3. No evidence  of pulmonary embolus. 4. Cardiomegaly with prominent biatrial dilatation. 5.  Aortic Atherosclerosis (ICD10-I70.0). Electronically Signed   By: Sharlet Salina M.D.   On: 01/10/2022 22:24   CT HEAD WO CONTRAST ( )  Result Date: 01/10/2022 CLINICAL DATA:  Mental status change, unknown cause EXAM: CT HEAD WITHOUT CONTRAST TECHNIQUE: Contiguous axial images were obtained from the base of the skull through the vertex without intravenous contrast. RADIATION DOSE REDUCTION: This exam was performed according to the departmental dose-optimization program which includes automated exposure control, adjustment of the mA and/or kV according to patient size and/or use of iterative reconstruction technique. COMPARISON:  07/25/2021 FINDINGS: Brain: There is periventricular white matter decreased attenuation consistent with small vessel ischemic changes. Ventricles, sulci and cisterns are prominent consistent with age related involutional changes. No acute intracranial hemorrhage, mass effect or shift. No hydrocephalus. Encephalomalacia identified consistent with chronic bilateral basal ganglia lacunar CVAs. Vascular: No hyperdense vessel or unexpected calcification. Skull: Normal. Negative for fracture or focal lesion. Sinuses/Orbits: No acute finding. IMPRESSION: Atrophy and chronic small vessel ischemic changes. Chronic lacunar CVAs bilateral basal ganglia. No acute intracranial process identified. Electronically Signed   By: Layla Maw M.D.   On: 01/10/2022 21:42   DG Chest Port 1 View  Result Date: 01/10/2022 CLINICAL DATA:  Short of breath EXAM: PORTABLE CHEST 1 VIEW COMPARISON:  09/03/2021 FINDINGS: Single frontal view of the chest demonstrates persistent enlargement of the cardiac silhouette. No airspace disease, effusion, or pneumothorax. Chronic central vascular prominence. Stable nodular density at the right apex, please see previous CT discussion. No acute bony abnormalities. IMPRESSION: 1. Chronic  central vascular congestion.  No acute airspace disease. 2. Stable nodularity at the right apex, please see previous CT chest discussion. Electronically Signed   By: Randa Ngo M.D.   On: 01/10/2022 20:20        Scheduled Meds:  apixaban  2.5 mg Oral BID   diltiazem  180 mg Oral Daily   famotidine  20 mg Oral BID   feeding supplement  237 mL Oral TID BM   furosemide  40 mg Intravenous Q12H   insulin aspart  0-15 Units Subcutaneous TID WC   insulin aspart  0-5 Units Subcutaneous QHS   leflunomide  10 mg Oral q morning   metoprolol succinate  150 mg Oral q morning   montelukast  10 mg Oral Daily   predniSONE  2.5 mg Oral Q breakfast   sodium chloride flush  3 mL Intravenous Q12H   Continuous Infusions:  sodium chloride       LOS: 1 day    Time spent: 35 minutes    Barb Merino, MD [facility] Pager [phone]

## 2022-01-11 NOTE — Progress Notes (Signed)
PT Cancellation Note  Patient Details Name: DERRICA SIEG MRN: [id number] DOB: 02-Nov-1930   Cancelled Treatment:    Reason Eval/Treat Not Completed: Medical issues which prohibited therapy. RN requesting we hold PT eval; pt reporting extreme SOB while supine in bed, on supplemental O2 and SpO2 varying, wanting respiratory to see pt. Will check back as appropriate.    Tori Hartley Wyke PT, DPT 01/11/22, 10:26 AM

## 2022-01-11 NOTE — ED Notes (Signed)
Attempted to medicate patient, but echo is in process.

## 2022-01-11 NOTE — Progress Notes (Signed)
  Echocardiogram 2D Echocardiogram has been performed.  Emily Robertson 01/11/2022, 4:17 PM

## 2022-01-11 NOTE — ED Notes (Addendum)
Critical lactic acid 2.3 - primary RN notified

## 2022-01-11 NOTE — ED Notes (Signed)
CBG 37, given 1 amp D50. Hospitalist paged.

## 2022-01-12 DIAGNOSIS — R531 Weakness: Secondary | ICD-10-CM | POA: Diagnosis not present

## 2022-01-12 DIAGNOSIS — Z7189 Other specified counseling: Secondary | ICD-10-CM | POA: Diagnosis not present

## 2022-01-12 DIAGNOSIS — J9621 Acute and chronic respiratory failure with hypoxia: Secondary | ICD-10-CM | POA: Diagnosis not present

## 2022-01-12 DIAGNOSIS — Z515 Encounter for palliative care: Secondary | ICD-10-CM

## 2022-01-12 LAB — BASIC METABOLIC PANEL
Anion gap: 13 (ref 5–15)
BUN: 37 mg/dL — ABNORMAL HIGH (ref 8–23)
CO2: 25 mmol/L (ref 22–32)
Calcium: 8.6 mg/dL — ABNORMAL LOW (ref 8.9–10.3)
Chloride: 98 mmol/L (ref 98–111)
Creatinine, Ser: 1.17 mg/dL — ABNORMAL HIGH (ref 0.44–1.00)
GFR, Estimated: 45 mL/min — ABNORMAL LOW (ref >60)
Glucose, Bld: 109 mg/dL — ABNORMAL HIGH (ref 70–99)
Potassium: 4.3 mmol/L (ref 3.5–5.1)
Sodium: 136 mmol/L (ref 135–145)

## 2022-01-12 LAB — GLUCOSE, CAPILLARY
Glucose-Capillary: 118 mg/dL — ABNORMAL HIGH (ref 70–99)
Glucose-Capillary: 119 mg/dL — ABNORMAL HIGH (ref 70–99)
Glucose-Capillary: 145 mg/dL — ABNORMAL HIGH (ref 70–99)
Glucose-Capillary: 157 mg/dL — ABNORMAL HIGH (ref 70–99)
Glucose-Capillary: 86 mg/dL (ref 70–99)

## 2022-01-12 LAB — PROCALCITONIN: Procalcitonin: 0.1 ng/mL

## 2022-01-12 MED ORDER — ORAL CARE MOUTH RINSE: 15.0000 mL | OROMUCOSAL | Status: DC | PRN

## 2022-01-12 NOTE — Progress Notes (Signed)
Patient noted to have saturated bed pad on chair, assisted to bathroom w/ walker where she voided an additional 100 cc clear yellow urine.  Noted to have pinpoint rash on ankles, on backs of lower legs, right hip crease, knuckles, and mid back.  Denies itching.  Per patient, has been there "a few days, maybe a week."  MD aware.  Bradd Burner, RN

## 2022-01-12 NOTE — Progress Notes (Signed)
Patient refusing Purewick, had 3 large unmeasured occurrences between 7 am and 7 pm (soaking bed pad and chair pad) and 500 mL measured output.  Bradd Burner, RN

## 2022-01-12 NOTE — Evaluation (Signed)
Physical Therapy Evaluation Patient Details Name: Emily Robertson MRN: [id number] DOB: 06/17/1930 Today's Date: 01/12/2022  History of Present Illness  88 year old female with history of essential hypertension and dyslipidemia who recently moved to independent living who was found confused and hypoxemic so brought to ER.  In the emergency room noted to be 80% on room air needing 3 L of oxygen.  BNP 1800.  Cardiac enzymes negative.  Skeletal survey negative.  Admitted due to hypoxemia, altered mental status.  Clinical Impression  Pt was fairly mobile with RW, however with some weaving with RW and imbalances for steering.Pt displayed weakness, fatigue and shortness of breath even with talking, and with walking as well. However pt very motivated and wanted to push a longer distance. Hard to get a oxygen saturation reading due to poor perfusion at finger, suggested to nurse a forehead probe. Ambulated on RA, tolerated well, no visible signs of poor oxygenation, however very hard to tell physically. Will continue to follow , recommend PT follow once [facility] at [facility] and family may be looking in to higher [facility] as [facility].        Recommendations for follow up therapy are one component of a multi-disciplinary discharge planning process, led by the attending physician.  Recommendations may be updated based on patient status, additional functional criteria and insurance authorization.  Follow Up Recommendations Home health PT (family also lookign into [facility] provided rehab prior to heading back to her apartment.)      Assistance Recommended at Discharge Intermittent Supervision/Assistance  Patient can return home with the following  A little help with walking and/or transfers;A little help with bathing/dressing/bathroom    Equipment Recommendations None recommended by PT  Recommendations for Other Services       Functional Status Assessment Patient has had a recent  decline in their functional status and demonstrates the ability to make significant improvements in function in a reasonable and predictable amount of time.     Precautions / Restrictions        Mobility  Bed Mobility                    Transfers Overall transfer level: Needs assistance Equipment used: Rolling walker (2 wheels) Transfers: Sit to/from Stand Sit to Stand: Min guard                Ambulation/Gait Ambulation/Gait assistance: Min guard Gait Distance (Feet): 200 Feet Assistive device: Rolling walker (2 wheels) Gait Pattern/deviations: Step-through pattern       General Gait Details: small steps, knees flexed a bit and pt stated they were gettign weak and especially with static standign trying to get O2 reading which was very difficult to get due to poor profusion in fingers.  Stairs            Wheelchair Mobility    Modified Rankin (Stroke Patients Only)       Balance Overall balance assessment: Needs assistance Sitting-balance support: Bilateral upper extremity supported, Feet supported Sitting balance-Leahy Scale: Normal     Standing balance support: Bilateral upper extremity supported, Reliant on assistive device for balance, During functional activity Standing balance-Leahy Scale: Fair Standing balance comment: was doing some "weaving " with gait and RW                             Pertinent Vitals/Pain Pain Assessment Pain Assessment: No/denies pain    Home  Living Family/patient expects to be discharged to::  (independent living at [facility])                   Additional Comments[facility]    Prior Function Prior Level of Function : Independent/Modified Independent             Mobility Comments: uses rollator ADLs Comments: uses rollator to walk to evelvator then to the dining hall. Also rollator in her apartment     Hand Dominance   Dominant Hand: Right     Extremity/Trunk Assessment        Lower Extremity Assessment Lower Extremity Assessment: Overall WFL for tasks assessed (fatigues very quickly and has some wheezing / SOB with little activity and talking)    Cervical / Trunk Assessment Cervical / Trunk Assessment: Kyphotic  Communication   Communication: No difficulties  Cognition Arousal/Alertness: Awake/alert Behavior During Therapy: WFL for tasks assessed/performed Overall Cognitive Status: Within Functional Limits for tasks assessed                                          General Comments      Exercises     Assessment/Plan    PT Assessment Patient needs continued PT services  PT Problem List Decreased strength;Decreased activity tolerance;Decreased mobility       PT Treatment Interventions Gait training;Functional mobility training;Therapeutic activities;Therapeutic exercise;Patient/family education    PT Goals (Current goals can be found in the Care Plan section)  Acute Rehab PT Goals Patient Stated Goal: I hope to be able to get back to my apartment or I may have to go to assitive living. PT Goal Formulation: With patient Time For Goal Achievement: 01/26/22 Potential to Achieve Goals: Good    Frequency Min 3X/week     Co-evaluation               AM-PAC PT "6 Clicks" Mobility  Outcome Measure Help needed turning from your back to your side while in a flat bed without using bedrails?: None Help needed moving from lying on your back to sitting on the side of a flat bed without using bedrails?: None Help needed moving to and from a bed to a chair (including a wheelchair)?: A Little Help needed standing up from a chair using your arms (e.g., wheelchair or bedside chair)?: A Little Help needed to walk in hospital room?: A Little Help needed climbing 3-5 steps with a railing? : A Little 6 Click Score: 20    End of Session Equipment Utilized During Treatment: Gait belt Activity  Tolerance: Patient tolerated treatment well Patient left: in chair;with nursing/sitter in room;with chair alarm set Nurse Communication: Mobility status PT Visit Diagnosis: Muscle weakness (generalized) (M62.81)    Time: [id number] PT Time Calculation (min) (ACUTE ONLY): 19 min   Charges:   PT Evaluation $PT Eval Low Complexity: 1 Low PT Treatments $Gait Training: 8-22 mins        Gatha Mayer, PT, MPT Acute Rehabilitation Services Office: [phone] If a weekend: [facility] w/e pager [phone] 01/12/2022      Clide Dales 01/12/2022, 5:22 PM

## 2022-01-12 NOTE — Progress Notes (Signed)
Patient arrived to room, accompanied by ED staff. Daughter present at this time, assisting with admission questions. Patient is alert and oriented x3 with moments of forgetfulness. She is oriented to room and call bell. Patient denies pain, no signs of distress. Vital signs within acceptable limits. Will continue to monitor.

## 2022-01-12 NOTE — Progress Notes (Signed)
Rounding Note    Patient Name: Emily Robertson Date of Encounter: 01/12/2022  [facility] Cardiologist: Olga Millers, MD   Subjective   No CP   Breathing is OK while sitting   Pt alert initially then got sleepy/drowsy while talking     Hard of hearing     Inpatient Medications    Scheduled Meds:  apixaban  2.5 mg Oral BID   diltiazem  180 mg Oral Daily   famotidine  20 mg Oral BID   feeding supplement  237 mL Oral TID BM   furosemide  80 mg Intravenous Q12H   insulin aspart  0-6 Units Subcutaneous Q4H   leflunomide  10 mg Oral q morning   metoprolol succinate  150 mg Oral q morning   montelukast  10 mg Oral Daily   predniSONE  2.5 mg Oral Q breakfast   sodium chloride flush  3 mL Intravenous Q12H   spironolactone  12.5 mg Oral Daily   Continuous Infusions:  sodium chloride     PRN Meds: sodium chloride, acetaminophen, ondansetron (ZOFRAN) IV, mouth rinse, sodium chloride flush   Vital Signs    Vitals:   01/11/22 2155 01/11/22 2357 01/12/22 0451 01/12/22 0452  BP: 135/88 125/67  133/88  Pulse:  79  68  Resp:  16  17  Temp:  97.7 F (36.5 C)  (!) 97.5 F (36.4 C)  TempSrc:    Oral  SpO2:  99%  100%  Weight:   45.3 kg   Height:        Intake/Output Summary (Last 24 hours) at 01/12/2022 0737 Last data filed at 01/12/2022 0500 Gross per 24 hour  Intake 487 ml  Output 320 ml  Net 167 ml      01/12/2022    4:51 AM 12/17/2021    3:12 PM 10/09/2021    2:01 PM  Last 3 Weights  Weight (lbs) 99 lb 13.9 oz 101 lb 101 lb 6.4 oz  Weight (kg) 45.3 kg 45.813 kg 45.995 kg      Telemetry    Afib  80s   - Personally Reviewed  ECG    No new  - Personally Reviewed  Physical Exam   GEN: Thin 88 yo in no acute distress.   Neck: JVP increased   Cardiac: Irreg irreg   No S3  ,  III/VI blowing systolic murmur LSB to apex   Respiratory: Relatively clear    GI: Soft, nontender, non-distended  MS: No edema; No deformity. Neuro:  Nonfocal  Psych:  Normal affect   Labs    High Sensitivity Troponin:   Recent Labs  Lab 01/10/22 1941 01/10/22 2156  TROPONINIHS 12 11     Chemistry Recent Labs  Lab 01/10/22 1941 01/11/22 0401 01/12/22 0541  NA 131*  --  136  K 4.2  --  4.3  CL 95*  --  98  CO2 23  --  25  GLUCOSE 158*  --  109*  BUN 40*  --  37*  CREATININE 1.28*  --  1.17*  CALCIUM 8.9  --  8.6*  MG  --  2.0  --   PROT 6.2*  --   --   ALBUMIN 3.7  --   --   AST 114*  --   --   ALT 147*  --   --   ALKPHOS 230*  --   --   BILITOT 1.3*  --   --   GFRNONAA 40*  --  45*  ANIONGAP 13  --  13    Lipids No results for input(s): "CHOL", "TRIG", "HDL", "LABVLDL", "LDLCALC", "CHOLHDL" in the last 168 hours.  Hematology Recent Labs  Lab 01/10/22 1941 01/11/22 0401  WBC 12.0* 10.4  RBC 4.30 4.08  HGB 14.3 13.5  HCT 43.2 40.5  MCV 100.5* 99.3  MCH 33.3 33.1  MCHC 33.1 33.3  RDW 16.2* 15.9*  PLT 198 184   Thyroid  Recent Labs  Lab 01/10/22 1941 01/11/22 0111  TSH 10.519*  --   FREET4  --  1.28*    BNP Recent Labs  Lab 01/10/22 1941  BNP 1,851.3*    DDimer No results for input(s): "DDIMER" in the last 168 hours.   Radiology    DG CHEST PORT 1 VIEW  Result Date: 01/11/2022 CLINICAL DATA:  Hypoxemia EXAM: PORTABLE CHEST 1 VIEW COMPARISON:  Chest x-ray 01/10/2022.  CT of the chest 11 20. FINDINGS: Small pleural effusions persist. The heart is enlarged. There central pulmonary vascular congestion. There is no lung consolidation or pneumothorax. No acute fractures are seen. IMPRESSION: Cardiomegaly with central pulmonary vascular congestion and small bilateral pleural effusions. Electronically Signed   By: Ronney Asters M.D.   On: 01/11/2022 18:51   ECHOCARDIOGRAM COMPLETE  Result Date: 01/11/2022    ECHOCARDIOGRAM REPORT   Patient Name:   Emily Robertson Date of Exam: 01/11/2022 Medical Rec #:  [id number]         Height:       63.0 in Accession #:    [id number]        Weight:       101.0 lb Date of Birth:   [birth year]         BSA:          1.446 m Patient Age:    88 years          BP:           107/95 mmHg Patient Gender: F                 HR:           88 bpm. Exam Location:  Inpatient Procedure: 2D Echo Indications:    CHF  History:        Patient has prior history of Echocardiogram examinations, most                 recent 07/26/2021. CHF, Mitral Valve Prolapse, Arrythmias:Atrial                 Fibrillation; Risk Factors:Hypertension.  Sonographer:    Harvie Junior Referring Phys: [id number] [facility]  Sonographer Comments: Restraints. IMPRESSIONS  1. Prolapse of posterior MV leaflet with severe, eccentric MR.  2. Left ventricular ejection fraction, by estimation, is 60 to 65%. The left ventricle has normal function. The left ventricle has no regional wall motion abnormalities. Left ventricular diastolic parameters are indeterminate.  3. Right ventricular systolic function is normal. The right ventricular size is normal. There is mildly elevated pulmonary artery systolic pressure.  4. Left atrial size was severely dilated.  5. Right atrial size was moderately dilated.  6. The mitral valve is abnormal. Severe mitral valve regurgitation. No evidence of mitral stenosis. There is moderate late systolic prolapse of post. leaflet of the mitral valve.  7. Tricuspid valve regurgitation is moderate to severe.  8. The aortic valve is tricuspid. Aortic valve regurgitation is mild. Aortic valve sclerosis/calcification is present, without any evidence of aortic stenosis.  9. The inferior vena cava is normal in size with greater than 50% respiratory variability, suggesting right atrial pressure of 3 mmHg. FINDINGS  Left Ventricle: Left ventricular ejection fraction, by estimation, is 60 to 65%. The left ventricle has normal function. The left ventricle has no regional wall motion abnormalities. The left ventricular internal cavity size was normal in size. There is  no left ventricular hypertrophy. Left ventricular diastolic  parameters are indeterminate. Right Ventricle: The right ventricular size is normal. Right ventricular systolic function is normal. There is mildly elevated pulmonary artery systolic pressure. The tricuspid regurgitant velocity is 2.97 m/s, and with an assumed right atrial pressure of 3 mmHg, the estimated right ventricular systolic pressure is 0000000 mmHg. Left Atrium: Left atrial size was severely dilated. Right Atrium: Right atrial size was moderately dilated. Pericardium: There is no evidence of pericardial effusion. Mitral Valve: The mitral valve is abnormal. There is moderate late systolic prolapse of post. leaflet of the mitral valve. Mild mitral annular calcification. Severe mitral valve regurgitation. No evidence of mitral valve stenosis. Tricuspid Valve: The tricuspid valve is normal in structure. Tricuspid valve regurgitation is moderate to severe. No evidence of tricuspid stenosis. Aortic Valve: The aortic valve is tricuspid. Aortic valve regurgitation is mild. Aortic regurgitation PHT measures 819 msec. Aortic valve sclerosis/calcification is present, without any evidence of aortic stenosis. Aortic valve mean gradient measures 2.0  mmHg. Aortic valve peak gradient measures 3.5 mmHg. Aortic valve area, by VTI measures 2.24 cm. Pulmonic Valve: The pulmonic valve was normal in structure. Pulmonic valve regurgitation is trivial. No evidence of pulmonic stenosis. Aorta: The aortic root is normal in size and structure. Venous: The inferior vena cava is normal in size with greater than 50% respiratory variability, suggesting right atrial pressure of 3 mmHg. IAS/Shunts: No atrial level shunt detected by color flow Doppler. Additional Comments: Prolapse of posterior MV leaflet with severe, eccentric MR.  LEFT VENTRICLE PLAX 2D LVIDd:         4.00 cm LVIDs:         2.90 cm LV PW:         1.10 cm LV IVS:        1.10 cm LVOT diam:     2.00 cm LV SV:         28 LV SV Index:   20 LVOT Area:     3.14 cm  LV Volumes  (MOD) LV vol d, MOD A2C: 57.6 ml LV vol d, MOD A4C: 44.8 ml LV vol s, MOD A2C: 25.5 ml LV vol s, MOD A4C: 21.2 ml LV SV MOD A2C:     32.1 ml LV SV MOD A4C:     44.8 ml LV SV MOD BP:      31.1 ml RIGHT VENTRICLE RV Basal diam:  4.40 cm RV Mid diam:    3.80 cm RV S prime:     7.18 cm/s TAPSE (M-mode): 1.6 cm LEFT ATRIUM              Index        RIGHT ATRIUM           Index LA diam:        2.80 cm  1.94 cm/m   RA Area:     20.70 cm LA Vol (A2C):   101.0 ml 69.83 ml/m  RA Volume:   56.60 ml  39.13 ml/m LA Vol (A4C):   83.6 ml  57.80 ml/m LA Biplane Vol: 96.5 ml  66.72 ml/m  AORTIC VALVE                    PULMONIC VALVE AV Area (Vmax):    1.72 cm     PV Vmax:       0.56 m/s AV Area (Vmean):   1.79 cm     PV Peak grad:  1.2 mmHg AV Area (VTI):     2.24 cm AV Vmax:           93.30 cm/s AV Vmean:          57.200 cm/s AV VTI:            0.126 m AV Peak Grad:      3.5 mmHg AV Mean Grad:      2.0 mmHg LVOT Vmax:         51.00 cm/s LVOT Vmean:        32.600 cm/s LVOT VTI:          0.090 m LVOT/AV VTI ratio: 0.71 AI PHT:            819 msec  AORTA Ao Root diam: 3.30 cm Ao Asc diam:  3.30 cm MITRAL VALVE                  TRICUSPID VALVE MV Area (PHT): 4.36 cm       TR Peak grad:   35.3 mmHg MV Decel Time: 174 msec       TR Vmax:        297.00 cm/s MR Peak grad:    75.9 mmHg MR Mean grad:    57.0 mmHg    SHUNTS MR Vmax:         435.50 cm/s  Systemic VTI:  0.09 m MR Vmean:        355.0 cm/s   Systemic Diam: 2.00 cm MR PISA:         6.28 cm MR PISA Eff ROA: 33 mm MR PISA Radius:  1.00 cm MV E velocity: 93.80 cm/s MV A velocity: 46.30 cm/s MV E/A ratio:  2.03 Kirk Ruths MD Electronically signed by Kirk Ruths MD Signature Date/Time: 01/11/2022/4:23:51 PM    Final    CT Chest W Contrast  Result Date: 01/10/2022 CLINICAL DATA:  Altered level of consciousness, fatigue, short of breath, hypoxia EXAM: CT CHEST WITH CONTRAST TECHNIQUE: Multidetector CT imaging of the chest was performed during intravenous contrast  administration. RADIATION DOSE REDUCTION: This exam was performed according to the departmental dose-optimization program which includes automated exposure control, adjustment of the mA and/or kV according to patient size and/or use of iterative reconstruction technique. CONTRAST:  60mL OMNIPAQUE IOHEXOL 300 MG/ML  SOLN COMPARISON:  01/10/2022, 09/03/2021, 01/12/2021 FINDINGS: Cardiovascular: The heart is enlarged with prominent biatrial dilatation. No pericardial effusion. No pulmonary emboli. No evidence of thoracic aortic aneurysm or dissection. Atherosclerosis of the aorta. Mediastinum/Nodes: No enlarged mediastinal, hilar, or axillary lymph nodes. Thyroid gland, trachea, and esophagus demonstrate no significant findings. Lungs/Pleura: There are persistent bilateral pleural effusions less than 1 L each. Ground-glass nodularity at the right apex measuring up to 1.5 cm is unchanged since prior exam, reference image 17/6. No new airspace disease. No pneumothorax. Stable background emphysema. Central airways are patent. Upper Abdomen: Reflux of contrast into the hepatic veins suggesting cardiac dysfunction. No acute upper abdominal findings. Musculoskeletal: No acute or destructive bony lesions. Reconstructed images demonstrate no additional findings. IMPRESSION: 1. Stable bilateral pleural effusions. 2. Stable ground-glass nodularity at the right apex, unchanged since 01/12/2021.  Follow-up CT is recommended every 2 years until 5 years of stability has been established. This recommendation follows the consensus statement: Guidelines for Management of Incidental Pulmonary Nodules Detected on CT Images: From the Fleischner Society 2017; Radiology 2017; 284:228-243. 3. No evidence of pulmonary embolus. 4. Cardiomegaly with prominent biatrial dilatation. 5.  Aortic Atherosclerosis (ICD10-I70.0). Electronically Signed   By: Randa Ngo M.D.   On: 01/10/2022 22:24   CT HEAD WO CONTRAST (5MM)  Result Date:  01/10/2022 CLINICAL DATA:  Mental status change, unknown cause EXAM: CT HEAD WITHOUT CONTRAST TECHNIQUE: Contiguous axial images were obtained from the base of the skull through the vertex without intravenous contrast. RADIATION DOSE REDUCTION: This exam was performed according to the departmental dose-optimization program which includes automated exposure control, adjustment of the mA and/or kV according to patient size and/or use of iterative reconstruction technique. COMPARISON:  07/25/2021 FINDINGS: Brain: There is periventricular white matter decreased attenuation consistent with small vessel ischemic changes. Ventricles, sulci and cisterns are prominent consistent with age related involutional changes. No acute intracranial hemorrhage, mass effect or shift. No hydrocephalus. Encephalomalacia identified consistent with chronic bilateral basal ganglia lacunar CVAs. Vascular: No hyperdense vessel or unexpected calcification. Skull: Normal. Negative for fracture or focal lesion. Sinuses/Orbits: No acute finding. IMPRESSION: Atrophy and chronic small vessel ischemic changes. Chronic lacunar CVAs bilateral basal ganglia. No acute intracranial process identified. Electronically Signed   By: Sammie Bench M.D.   On: 01/10/2022 21:42   DG Chest Port 1 View  Result Date: 01/10/2022 CLINICAL DATA:  Short of breath EXAM: PORTABLE CHEST 1 VIEW COMPARISON:  09/03/2021 FINDINGS: Single frontal view of the chest demonstrates persistent enlargement of the cardiac silhouette. No airspace disease, effusion, or pneumothorax. Chronic central vascular prominence. Stable nodular density at the right apex, please see previous CT discussion. No acute bony abnormalities. IMPRESSION: 1. Chronic central vascular congestion.  No acute airspace disease. 2. Stable nodularity at the right apex, please see previous CT chest discussion. Electronically Signed   By: Randa Ngo M.D.   On: 01/10/2022 20:20    Cardiac Studies   :  Transesophageal echo dated 09/02/2021, flail P2 segment, severe mitral valve regurgitation, LV function 65-70%    Patient Profile      : 88 year old female with history of hypertension, hyperlipidemia, severe mitral valve regurgitation, persistent atrial fibrillation, hyperthyroidism and rheumatoid arthritis who was admitted on 01/10/2022 for acute hypoxic respiratory failure secondary to volume overload.   Assessment & Plan    1  HFpEF   Pt with severe MR that is probable etiology of CHF   She presented with acute resp failure and volume overload    Pt still tachypneic with talking      I would continue IV lasix   Strict I/O         2  Mitral regurgitation.   Pt with flail P2 segment of MV and severe MR   Pt has seen Drs Ali Lowe and Farris Has for consideration of MitraClip.    Son said patient would be interested but was concerned about thyroid    Will review again with son current progress   2  Atrial fibrillation  Rates controlled    Continue current Rx  Continue tele   Continue Eliquis    3  Hx hyperthyroidism  TSH 10   Free T4 1.28   T3 in process     For questions or updates, please contact [place] Please consult [email or web address] for contact info under  Signed, Dorris Carnes, MD  01/12/2022, 7:37 AM

## 2022-01-12 NOTE — Progress Notes (Signed)
PROGRESS NOTE    Emily Robertson  [id number] DOB: December 09, 1930 DOA: 01/10/2022 PCP: [name], FNP    Brief Narrative:  88 year old female with history of essential hypertension and dyslipidemia who recently moved to independent living who was found confused and hypoxemic so brought to ER.  In the emergency room noted to be 80% on room air needing 3 L of oxygen.  BNP 1800.  Cardiac enzymes negative.  Skeletal survey negative.  Admitted due to hypoxemia, altered mental status.   Assessment & Plan:   Acute hypoxemic respiratory failure, Severe mitral regurgitation probably causing valvular heart disease and fluid overload. Currently on IV Lasix, intake output monitoring.  Some clinical improvement today. Echocardiogram shows severe mitral regurgitation and tricuspid regurgitation.  Ejection fraction was normal.  Chest x-ray with persistent cardiomegaly and interstitial volume.  Patient on Cardizem, metoprolol.   Mitral valve regurgitation causing fluid overload.  She is currently followed at cardiology office for consideration of MitraClip procedure.  Followed by cardiology in the hospital.  Currently on IV diuresis with some clinical stabilization.  Paroxysmal atrial fibrillation: Currently in A-fib.  Rate controlled on metoprolol and Cardizem.  Therapeutic on Eliquis.  Acute metabolic encephalopathy: Likely secondary to #1. CT head was normal.  CT angiogram without evidence of pneumonia or pulmonary embolism. Urinalysis is clear, 123456 folic acid levels are normal.  Ammonia is normal. Mental status is clearing up. TSH 10,   Steroid-induced hyperglycemia: Patient had a hypoglycemic episode after admission due to poor intake.  Continue monitoring.  Encourage oral intake.  We will keep on very sensitive sliding scale.  Chronic medical issues including Essential hypertension: Stable on Cardizem and metoprolol. Hypothyroidism: TSH is 10, just started On methimazole. Rheumatoid  arthritis: On Arava.  Patient on maintenance prednisone 2.5 mg daily. GERD: On Pepcid.    Work with PT OT.  Cardiology actively following.  Possible skilled nursing facility placement.  She will probably need oxygen on discharge.   DVT prophylaxis: SCDs Start: 01/11/22 0006 apixaban (ELIQUIS) tablet 2.5 mg Start: 01/10/22 2345 apixaban (ELIQUIS) tablet 2.5 mg   Code Status: Full Code, seen by palliative medicine. Family Communication: Son on the phone, called and updated. Disposition Plan: Status is: Inpatient Remains inpatient appropriate because: significant SOB      Consultants:  Cardiology Palliative care   Procedures:  None  Antimicrobials:  None     Subjective:  Patient seen and examined.  She looks more interactive and alert today.  Forgetful and keeps asking same questions however she is more quiet, calm and alert.  No other overnight events.  Telemetry shows rate controlled A-fib.  Patient is on 2 L oxygen. Called and updated patient's son on the phone.  Objective: Vitals:   01/11/22 2357 01/12/22 0451 01/12/22 0452 01/12/22 0852  BP: 125/67  133/88 126/70  Pulse: 79  68 88  Resp: 16  17 18   Temp: 97.7 F (36.5 C)  (!) 97.5 F (36.4 C) 97.6 F (36.4 C)  TempSrc:   Oral Oral  SpO2: 99%  100% 95%  Weight:  45.3 kg    Height:        Intake/Output Summary (Last 24 hours) at 01/12/2022 1138 Last data filed at 01/12/2022 1018 Gross per 24 hour  Intake 724 ml  Output 420 ml  Net 304 ml   Filed Weights   01/12/22 0451  Weight: 45.3 kg    Examination:  General exam: Appears calm and comfortable  Cachectic.  Frail and debilitated.  Chronically sick looking.  Not in any distress today. Respiratory system: Clear to auscultation.  No added sounds.SpO2: 95 % O2 Flow Rate (L/min): 6 L/min  Cardiovascular system: S1 & S2 heard, irregularly irregular. No pedal edema. Gastrointestinal system: Abdomen is nondistended, soft and nontender. No organomegaly or  masses felt. Normal bowel sounds heard. Central nervous system: Alert and oriented.  Mostly oriented.  No focal neurological deficits.  Moves all extremities.    Data Reviewed: I have personally reviewed following labs and imaging studies  CBC: Recent Labs  Lab 01/10/22 1941 01/11/22 0401  WBC 12.0* 10.4  NEUTROABS 9.5* 7.8*  HGB 14.3 13.5  HCT 43.2 40.5  MCV 100.5* 99.3  PLT 198 184   Basic Metabolic Panel: Recent Labs  Lab 01/10/22 1941 01/11/22 0401 01/12/22 0541  NA 131*  --  136  K 4.2  --  4.3  CL 95*  --  98  CO2 23  --  25  GLUCOSE 158*  --  109*  BUN 40*  --  37*  CREATININE 1.28*  --  1.17*  CALCIUM 8.9  --  8.6*  MG  --  2.0  --    GFR: Estimated Creatinine Clearance: 23.3 mL/min (A) (by C-G formula based on SCr of 1.17 mg/dL (H)). Liver Function Tests: Recent Labs  Lab 01/10/22 1941  AST 114*  ALT 147*  ALKPHOS 230*  BILITOT 1.3*  PROT 6.2*  ALBUMIN 3.7   No results for input(s): "LIPASE", "AMYLASE" in the last 168 hours. Recent Labs  Lab 01/11/22 0656  AMMONIA 17   Coagulation Profile: No results for input(s): "INR", "PROTIME" in the last 168 hours. Cardiac Enzymes: No results for input(s): "CKTOTAL", "CKMB", "CKMBINDEX", "TROPONINI" in the last 168 hours. BNP (last 3 results) No results for input(s): "PROBNP" in the last 8760 hours. HbA1C: No results for input(s): "HGBA1C" in the last 72 hours. CBG: Recent Labs  Lab 01/11/22 1829 01/11/22 2045 01/12/22 0001 01/12/22 0439 01/12/22 0729  GLUCAP 157* 189* 118* 86 119*   Lipid Profile: No results for input(s): "CHOL", "HDL", "LDLCALC", "TRIG", "CHOLHDL", "LDLDIRECT" in the last 72 hours. Thyroid Function Tests: Recent Labs    01/10/22 1941 01/11/22 0111  TSH 10.519*  --   FREET4  --  1.28*   Anemia Panel: Recent Labs    01/11/22 0401  VITAMINB12 2,870*  FOLATE 33.5   Sepsis Labs: Recent Labs  Lab 01/11/22 1330 01/11/22 1745 01/12/22 0541  PROCALCITON <0.10  --   0.10  LATICACIDVEN 2.3* 2.0*  --     Recent Results (from the past 240 hour(s))  Resp Panel by RT-PCR (Flu A&B, Covid) Anterior Nasal Swab     Status: None   Collection Time: 01/10/22  7:41 PM   Specimen: Anterior Nasal Swab  Result Value Ref Range Status   SARS Coronavirus 2 by RT PCR NEGATIVE NEGATIVE Final    Comment: (NOTE) SARS-CoV-2 target nucleic acids are NOT DETECTED.  The SARS-CoV-2 RNA is generally detectable in upper respiratory specimens during the acute phase of infection. The lowest concentration of SARS-CoV-2 viral copies this assay can detect is 138 copies/mL. A negative result does not preclude SARS-Cov-2 infection and should not be used as the sole basis for treatment or other patient management decisions. A negative result may occur with  improper specimen collection/handling, submission of specimen other than nasopharyngeal swab, presence of viral mutation(s) within the areas targeted by this assay, and inadequate number of viral copies(<138 copies/mL). A negative result must  be combined with clinical observations, patient history, and epidemiological information. The expected result is Negative.  Fact Sheet for Patients:  EntrepreneurPulse.com.au  Fact Sheet for Healthcare Providers:  IncredibleEmployment.be  This test is no t yet approved or cleared by the [place] FDA and  has been authorized for detection and/or diagnosis of SARS-CoV-2 by FDA under an Emergency Use Authorization (EUA). This EUA will remain  in effect (meaning this test can be used) for the duration of the COVID-19 declaration under Section 564(b)(1) of the Act, 21 U.S.C.section 360bbb-3(b)(1), unless the authorization is terminated  or revoked sooner.       Influenza A by PCR NEGATIVE NEGATIVE Final   Influenza B by PCR NEGATIVE NEGATIVE Final    Comment: (NOTE) The Xpert Xpress SARS-CoV-2/FLU/RSV plus assay is intended as an aid in the  diagnosis of influenza from Nasopharyngeal swab specimens and should not be used as a sole basis for treatment. Nasal washings and aspirates are unacceptable for Xpert Xpress SARS-CoV-2/FLU/RSV testing.  Fact Sheet for Patients: EntrepreneurPulse.com.au  Fact Sheet for Healthcare Providers: IncredibleEmployment.be  This test is not yet approved or cleared by the [place] FDA and has been authorized for detection and/or diagnosis of SARS-CoV-2 by FDA under an Emergency Use Authorization (EUA). This EUA will remain in effect (meaning this test can be used) for the duration of the COVID-19 declaration under Section 564(b)(1) of the Act, 21 U.S.C. section 360bbb-3(b)(1), unless the authorization is terminated or revoked.  Performed at [facility], [place] [address] [place], [place] [address]          Radiology Studies: DG CHEST PORT 1 VIEW  Result Date: 01/11/2022 CLINICAL DATA:  Hypoxemia EXAM: PORTABLE CHEST 1 VIEW COMPARISON:  Chest x-ray 01/10/2022.  CT of the chest 11 20. FINDINGS: Small pleural effusions persist. The heart is enlarged. There central pulmonary vascular congestion. There is no lung consolidation or pneumothorax. No acute fractures are seen. IMPRESSION: Cardiomegaly with central pulmonary vascular congestion and small bilateral pleural effusions. Electronically Signed   By: Ronney Asters M.D.   On: 01/11/2022 18:51   ECHOCARDIOGRAM COMPLETE  Result Date: 01/11/2022    ECHOCARDIOGRAM REPORT   Patient Name:   Emily Robertson Date of Exam: 01/11/2022 Medical Rec #:  [id number]         Height:       63.0 in Accession #:    [id number]        Weight:       101.0 lb Date of Birth:  09-27-1930         BSA:          1.446 m Patient Age:    86 years          BP:           107/95 mmHg Patient Gender: F                 HR:           88 bpm. Exam Location:  Inpatient Procedure: 2D Echo Indications:    CHF  History:         Patient has prior history of Echocardiogram examinations, most                 recent 07/26/2021. CHF, Mitral Valve Prolapse, Arrythmias:Atrial                 Fibrillation; Risk Factors:Hypertension.  Sonographer:    Harvie Junior Referring Phys: [id number] Barb Merino  Sonographer Comments: Restraints. IMPRESSIONS  1. Prolapse of posterior MV leaflet with severe, eccentric MR.  2. Left ventricular ejection fraction, by estimation, is 60 to 65%. The left ventricle has normal function. The left ventricle has no regional wall motion abnormalities. Left ventricular diastolic parameters are indeterminate.  3. Right ventricular systolic function is normal. The right ventricular size is normal. There is mildly elevated pulmonary artery systolic pressure.  4. Left atrial size was severely dilated.  5. Right atrial size was moderately dilated.  6. The mitral valve is abnormal. Severe mitral valve regurgitation. No evidence of mitral stenosis. There is moderate late systolic prolapse of post. leaflet of the mitral valve.  7. Tricuspid valve regurgitation is moderate to severe.  8. The aortic valve is tricuspid. Aortic valve regurgitation is mild. Aortic valve sclerosis/calcification is present, without any evidence of aortic stenosis.  9. The inferior vena cava is normal in size with greater than 50% respiratory variability, suggesting right atrial pressure of 3 mmHg. FINDINGS  Left Ventricle: Left ventricular ejection fraction, by estimation, is 60 to 65%. The left ventricle has normal function. The left ventricle has no regional wall motion abnormalities. The left ventricular internal cavity size was normal in size. There is  no left ventricular hypertrophy. Left ventricular diastolic parameters are indeterminate. Right Ventricle: The right ventricular size is normal. Right ventricular systolic function is normal. There is mildly elevated pulmonary artery systolic pressure. The tricuspid regurgitant velocity is 2.97 m/s, and  with an assumed right atrial pressure of 3 mmHg, the estimated right ventricular systolic pressure is 0000000 mmHg. Left Atrium: Left atrial size was severely dilated. Right Atrium: Right atrial size was moderately dilated. Pericardium: There is no evidence of pericardial effusion. Mitral Valve: The mitral valve is abnormal. There is moderate late systolic prolapse of post. leaflet of the mitral valve. Mild mitral annular calcification. Severe mitral valve regurgitation. No evidence of mitral valve stenosis. Tricuspid Valve: The tricuspid valve is normal in structure. Tricuspid valve regurgitation is moderate to severe. No evidence of tricuspid stenosis. Aortic Valve: The aortic valve is tricuspid. Aortic valve regurgitation is mild. Aortic regurgitation PHT measures 819 msec. Aortic valve sclerosis/calcification is present, without any evidence of aortic stenosis. Aortic valve mean gradient measures 2.0  mmHg. Aortic valve peak gradient measures 3.5 mmHg. Aortic valve area, by VTI measures 2.24 cm. Pulmonic Valve: The pulmonic valve was normal in structure. Pulmonic valve regurgitation is trivial. No evidence of pulmonic stenosis. Aorta: The aortic root is normal in size and structure. Venous: The inferior vena cava is normal in size with greater than 50% respiratory variability, suggesting right atrial pressure of 3 mmHg. IAS/Shunts: No atrial level shunt detected by color flow Doppler. Additional Comments: Prolapse of posterior MV leaflet with severe, eccentric MR.  LEFT VENTRICLE PLAX 2D LVIDd:         4.00 cm LVIDs:         2.90 cm LV PW:         1.10 cm LV IVS:        1.10 cm LVOT diam:     2.00 cm LV SV:         28 LV SV Index:   20 LVOT Area:     3.14 cm  LV Volumes (MOD) LV vol d, MOD A2C: 57.6 ml LV vol d, MOD A4C: 44.8 ml LV vol s, MOD A2C: 25.5 ml LV vol s, MOD A4C: 21.2 ml LV SV MOD A2C:     32.1 ml LV  SV MOD A4C:     44.8 ml LV SV MOD BP:      31.1 ml RIGHT VENTRICLE RV Basal diam:  4.40 cm RV Mid diam:     3.80 cm RV S prime:     7.18 cm/s TAPSE (M-mode): 1.6 cm LEFT ATRIUM              Index        RIGHT ATRIUM           Index LA diam:        2.80 cm  1.94 cm/m   RA Area:     20.70 cm LA Vol (A2C):   101.0 ml 69.83 ml/m  RA Volume:   56.60 ml  39.13 ml/m LA Vol (A4C):   83.6 ml  57.80 ml/m LA Biplane Vol: 96.5 ml  66.72 ml/m  AORTIC VALVE                    PULMONIC VALVE AV Area (Vmax):    1.72 cm     PV Vmax:       0.56 m/s AV Area (Vmean):   1.79 cm     PV Peak grad:  1.2 mmHg AV Area (VTI):     2.24 cm AV Vmax:           93.30 cm/s AV Vmean:          57.200 cm/s AV VTI:            0.126 m AV Peak Grad:      3.5 mmHg AV Mean Grad:      2.0 mmHg LVOT Vmax:         51.00 cm/s LVOT Vmean:        32.600 cm/s LVOT VTI:          0.090 m LVOT/AV VTI ratio: 0.71 AI PHT:            819 msec  AORTA Ao Root diam: 3.30 cm Ao Asc diam:  3.30 cm MITRAL VALVE                  TRICUSPID VALVE MV Area (PHT): 4.36 cm       TR Peak grad:   35.3 mmHg MV Decel Time: 174 msec       TR Vmax:        297.00 cm/s MR Peak grad:    75.9 mmHg MR Mean grad:    57.0 mmHg    SHUNTS MR Vmax:         435.50 cm/s  Systemic VTI:  0.09 m MR Vmean:        355.0 cm/s   Systemic Diam: 2.00 cm MR PISA:         6.28 cm MR PISA Eff ROA: 33 mm MR PISA Radius:  1.00 cm MV E velocity: 93.80 cm/s MV A velocity: 46.30 cm/s MV E/A ratio:  2.03 Kirk Ruths MD Electronically signed by Kirk Ruths MD Signature Date/Time: 01/11/2022/4:23:51 PM    Final    CT Chest W Contrast  Result Date: 01/10/2022 CLINICAL DATA:  Altered level of consciousness, fatigue, short of breath, hypoxia EXAM: CT CHEST WITH CONTRAST TECHNIQUE: Multidetector CT imaging of the chest was performed during intravenous contrast administration. RADIATION DOSE REDUCTION: This exam was performed according to the departmental dose-optimization program which includes automated exposure control, adjustment of the mA and/or kV according to patient size and/or use of iterative  reconstruction technique. CONTRAST:  16mL  OMNIPAQUE IOHEXOL 300 MG/ML  SOLN COMPARISON:  01/10/2022, 09/03/2021, 01/12/2021 FINDINGS: Cardiovascular: The heart is enlarged with prominent biatrial dilatation. No pericardial effusion. No pulmonary emboli. No evidence of thoracic aortic aneurysm or dissection. Atherosclerosis of the aorta. Mediastinum/Nodes: No enlarged mediastinal, hilar, or axillary lymph nodes. Thyroid gland, trachea, and esophagus demonstrate no significant findings. Lungs/Pleura: There are persistent bilateral pleural effusions less than 1 L each. Ground-glass nodularity at the right apex measuring up to 1.5 cm is unchanged since prior exam, reference image 17/6. No new airspace disease. No pneumothorax. Stable background emphysema. Central airways are patent. Upper Abdomen: Reflux of contrast into the hepatic veins suggesting cardiac dysfunction. No acute upper abdominal findings. Musculoskeletal: No acute or destructive bony lesions. Reconstructed images demonstrate no additional findings. IMPRESSION: 1. Stable bilateral pleural effusions. 2. Stable ground-glass nodularity at the right apex, unchanged since 01/12/2021. Follow-up CT is recommended every 2 years until 5 years of stability has been established. This recommendation follows the consensus statement: Guidelines for Management of Incidental Pulmonary Nodules Detected on CT Images: From the Fleischner Society 2017; Radiology 2017; 284:228-243. 3. No evidence of pulmonary embolus. 4. Cardiomegaly with prominent biatrial dilatation. 5.  Aortic Atherosclerosis (ICD10-I70.0). Electronically Signed   By: Randa Ngo M.D.   On: 01/10/2022 22:24   CT HEAD WO CONTRAST (5MM)  Result Date: 01/10/2022 CLINICAL DATA:  Mental status change, unknown cause EXAM: CT HEAD WITHOUT CONTRAST TECHNIQUE: Contiguous axial images were obtained from the base of the skull through the vertex without intravenous contrast. RADIATION DOSE REDUCTION: This exam  was performed according to the departmental dose-optimization program which includes automated exposure control, adjustment of the mA and/or kV according to patient size and/or use of iterative reconstruction technique. COMPARISON:  07/25/2021 FINDINGS: Brain: There is periventricular white matter decreased attenuation consistent with small vessel ischemic changes. Ventricles, sulci and cisterns are prominent consistent with age related involutional changes. No acute intracranial hemorrhage, mass effect or shift. No hydrocephalus. Encephalomalacia identified consistent with chronic bilateral basal ganglia lacunar CVAs. Vascular: No hyperdense vessel or unexpected calcification. Skull: Normal. Negative for fracture or focal lesion. Sinuses/Orbits: No acute finding. IMPRESSION: Atrophy and chronic small vessel ischemic changes. Chronic lacunar CVAs bilateral basal ganglia. No acute intracranial process identified. Electronically Signed   By: Sammie Bench M.D.   On: 01/10/2022 21:42   DG Chest Port 1 View  Result Date: 01/10/2022 CLINICAL DATA:  Short of breath EXAM: PORTABLE CHEST 1 VIEW COMPARISON:  09/03/2021 FINDINGS: Single frontal view of the chest demonstrates persistent enlargement of the cardiac silhouette. No airspace disease, effusion, or pneumothorax. Chronic central vascular prominence. Stable nodular density at the right apex, please see previous CT discussion. No acute bony abnormalities. IMPRESSION: 1. Chronic central vascular congestion.  No acute airspace disease. 2. Stable nodularity at the right apex, please see previous CT chest discussion. Electronically Signed   By: Randa Ngo M.D.   On: 01/10/2022 20:20        Scheduled Meds:  apixaban  2.5 mg Oral BID   diltiazem  180 mg Oral Daily   famotidine  20 mg Oral BID   feeding supplement  237 mL Oral TID BM   furosemide  80 mg Intravenous Q12H   insulin aspart  0-6 Units Subcutaneous Q4H   leflunomide  10 mg Oral q morning    metoprolol succinate  150 mg Oral q morning   montelukast  10 mg Oral Daily   predniSONE  2.5 mg Oral Q breakfast   sodium  chloride flush  3 mL Intravenous Q12H   spironolactone  12.5 mg Oral Daily   Continuous Infusions:  sodium chloride       LOS: 2 days    Time spent: 35 minutes    Barb Merino, MD [facility] Pager [phone]

## 2022-01-12 NOTE — Consult Note (Signed)
Consultation Note Date: 01/12/2022   Patient Name: Emily Robertson  DOB: 11-17-1930  MRN: [id number]  Age / Sex: 88 y.o., female  PCP: Emily Crews, FNP Referring Physician: Barb Merino, MD  Reason for Consultation: Establishing goals of care  HPI/Patient Profile: 88 y.o. female  admitted on 01/10/2022   Clinical Assessment and Goals of Care: 88 year old lady with hypertension dyslipidemia severe mitral regurgitation persistent atrial fibrillation rheumatoid arthritis presented to the hospital and has been admitted to hospital medicine service with cardiology colleagues following for acute hypoxic respiratory failure secondary to volume overload, admitted for heart failure with preserved ejection fraction, echo shows severe mitral regurgitation with flail P2 segment of mitral valve, cardiology following, patient was being considered for MitraClip. Palliative medicine consultation for CODE STATUS and goals of care discussions has been requested. Patient is awake alert sitting up in a chair.  She is having some cough while sipping her coffee.  Otherwise, the patient is awake alert and answers questions appropriately.  She does become tachypneic over the course of our conversation.  Introduced myself and palliative care as follows:  Palliative medicine is specialized medical care for people living with serious illness. It focuses on providing relief from the symptoms and stress of a serious illness. The goal is to improve quality of life for both the patient and the family. Goals of care: Broad aims of medical therapy in relation to the patient's values and preferences. Our aim is to provide medical care aimed at enabling patients to achieve the goals that matter most to them, given the circumstances of their particular medical situation and their constraints.   Chart reviewed, patient seen and examined,  discussed with patient, subsequently call placed and also discussed with son Emily Robertson.  Patient has 2 children, she has completed living will and advance care planning documents in the past.  Her wishes have been for full code.  Patient's son wishes to cautiously continue scope of current hospitalization.  He understands the serious nature of the patient's current condition.  HCPOA Son Emily Robertson [id number], also has a daughter Emily Robertson who lives near [place].  SUMMARY OF RECOMMENDATIONS   Full code for now, discussed with patient and son Emily Robertson, monitor hospital course, cardiology recommendations noted, recommend addition of palliative [facility] at [facility] upon discharge.  Thank you for the consult. Code Status/Advance Care Planning: Full code   Symptom Management:    Palliative Prophylaxis:  Bowel Regimen  Additional Recommendations (Limitations, Scope, Preferences): Full Scope Treatment  Psycho-social/Spiritual:  Desire for further Chaplaincy support:yes Additional Recommendations: Caregiving  Support/Resources  Prognosis:  Unable to determine  Discharge Planning: To Be Determined      Primary Diagnoses: Present on Admission:  Acute on chronic respiratory failure with hypoxia (HCC)  HTN (hypertension)  Hyperthyroidism  Rheumatoid arthritis ([place])  Pure hypercholesterolemia  Paroxysmal atrial fibrillation (HCC)  Protein-calorie malnutrition, severe   I have reviewed the medical record, interviewed the patient and family, and examined the patient. The following aspects are pertinent.  Past Medical  History:  Diagnosis Date   Arthritis    Glaucoma of both eyes    Hyperlipidemia    Hypertension    Pelvic prolapse    anterior floor   Urgency of urination    Urinary hesitancy    Urticaria    Social History   Socioeconomic History   Marital status: Widowed    Spouse name: Not on file   Number of children: Not on file   Years of education: Not on file   Highest  education level: Not on file  Occupational History   Not on file  Tobacco Use   Smoking status: Never   Smokeless tobacco: Never  Vaping Use   Vaping Use: Never used  Substance and Sexual Activity   Alcohol use: Not Currently    Comment: rare   Drug use: No   Sexual activity: Not on file  Other Topics Concern   Not on file  Social History Narrative   Not on file   Social Determinants of Health   Financial Resource Strain: Not on file  Food Insecurity: No Food Insecurity (01/11/2022)   Hunger Vital Sign    Worried About Running Out of Food in the Last Year: Never true    Ran Out of Food in the Last Year: Never true  Transportation Needs: No Transportation Needs (01/11/2022)   PRAPARE - Hydrologist (Medical): No    Lack of Transportation (Non-Medical): No  Physical Activity: Not on file  Stress: Not on file  Social Connections: Not on file   Family History  Problem Relation Age of Onset   Hypertension Mother    Thyroid disease Neg Hx    Diabetes Neg Hx    Scheduled Meds:  apixaban  2.5 mg Oral BID   diltiazem  180 mg Oral Daily   famotidine  20 mg Oral BID   feeding supplement  237 mL Oral TID BM   furosemide  80 mg Intravenous Q12H   insulin aspart  0-6 Units Subcutaneous Q4H   leflunomide  10 mg Oral q morning   metoprolol succinate  150 mg Oral q morning   montelukast  10 mg Oral Daily   predniSONE  2.5 mg Oral Q breakfast   sodium chloride flush  3 mL Intravenous Q12H   spironolactone  12.5 mg Oral Daily   Continuous Infusions:  sodium chloride     PRN Meds:.sodium chloride, acetaminophen, ondansetron (ZOFRAN) IV, mouth rinse, sodium chloride flush Medications Prior to Admission:  Prior to Admission medications   Medication Sig Start Date End Date Taking? Authorizing Provider  alendronate (FOSAMAX) 70 MG tablet Take 70 mg by mouth every Wednesday. 08/10/14  Yes [provider]  apixaban (ELIQUIS) 2.5 MG TABS tablet Take  1 tablet (2.5 mg total) by mouth 2 (two) times daily. 07/31/21  Yes Georgette Shell, MD  Ascorbic Acid (VITAMIN C PO) Take 1 tablet by mouth every morning.   Yes [provider]  atorvastatin (LIPITOR) 20 MG tablet Take 20 mg by mouth daily. 12/14/21  Yes [provider]  CALCIUM PO Take 1 tablet by mouth daily with supper.   Yes [provider]  cetirizine (ZYRTEC) 10 MG tablet Take 1 tablet (10 mg total) by mouth 2 (two) times daily. 10/28/19  Yes Padgett, Rae Halsted, MD  Cholecalciferol (VITAMIN D3 PO) Take 1,000 Units by mouth daily with supper.   Yes [provider]  diltiazem (CARDIZEM CD) 180 MG 24 hr capsule  Take 1 capsule (180 mg total) by mouth daily. 08/01/21  Yes Alwyn Ren, MD  EPINEPHrine (EPIPEN) 0.3 mg/0.3 mL IJ SOAJ injection Inject 0.3 mg into the muscle once as needed for anaphylaxis. 09/06/21  Yes Hongalgi, Maximino Greenland, MD  famotidine (PEPCID) 20 MG tablet Take 1 tablet (20 mg total) by mouth 2 (two) times daily. 10/28/19  Yes Padgett, Pilar Grammes, MD  feeding supplement (ENSURE ENLIVE / ENSURE PLUS) LIQD Take 237 mLs by mouth 2 (two) times daily between meals. 09/07/21  Yes [provider]  furosemide (LASIX) 40 MG tablet Take 1 tablet (40 mg total) by mouth daily. 09/07/21  Yes Hongalgi, Maximino Greenland, MD  latanoprost (XALATAN) 0.005 % ophthalmic solution Place 1 drop into the left eye at bedtime. 09/14/19  Yes [provider]  leflunomide (ARAVA) 10 MG tablet Take 10 mg by mouth every morning. 07/24/21  Yes [provider]  methimazole (TAPAZOLE) 10 MG tablet Take 1 tablet (10 mg total) by mouth 2 (two) times daily. Patient taking differently: Take 5 mg by mouth daily. 07/31/21  Yes Alwyn Ren, MD  metoprolol succinate (TOPROL-XL) 100 MG 24 hr tablet Take 1.5 tablets (150 mg total) by mouth every morning. 09/07/21  Yes Hongalgi, Maximino Greenland, MD  montelukast (SINGULAIR) 10 MG tablet Take 1 tablet (10 mg  total) by mouth daily. Patient taking differently: Take 10 mg by mouth every evening. 12/06/19  Yes Padgett, Pilar Grammes, MD  Multiple Vitamin (MULTIVITAMIN WITH MINERALS) TABS tablet Take 1 tablet by mouth every morning.   Yes [provider]  Omega-3 Fatty Acids (FISH OIL) 1000 MG CAPS Take 1,000 mg by mouth every morning.   Yes [provider]  predniSONE (DELTASONE) 2.5 MG tablet Take 1 tablet (2.5 mg total) by mouth daily with breakfast. 08/01/21  Yes Alwyn Ren, MD  Probiotic Product (PROBIOTIC DAILY PO) Take 1 capsule by mouth daily with supper.   Yes [provider]  timolol (TIMOPTIC) 0.5 % ophthalmic solution Place 1 drop into both eyes 2 (two) times daily.   Yes [provider]   Allergies  Allergen Reactions   Cozaar [Losartan] Swelling    Tongue swelling   Erythromycin Itching   Azopt [Brinzolamide] Rash    Rash on eyelid   Review of Systems no chest pain denies shortness of breath Physical Exam Awake alert oriented Frail elderly lady Coughs while sipping on her coffee Has holosystolic murmur irregular Clear breath sounds No edema Is thin and frail No acute distress  Vital Signs: BP 126/70 (BP Location: Left Arm)   Pulse 88   Temp 97.6 F (36.4 C) (Oral)   Resp 18   Ht 5\' 3"  (1.6 m)   Wt 45.3 kg   SpO2 95%   BMI 17.69 kg/m  Pain Scale: 0-10   Pain Score: 0-No pain   SpO2: SpO2: 95 % O2 Device:SpO2: 95 % O2 Flow Rate: .O2 Flow Rate (L/min): 6 L/min  IO: Intake/output summary:  Intake/Output Summary (Last 24 hours) at 01/12/2022 1058 Last data filed at 01/12/2022 1018 Gross per 24 hour  Intake 724 ml  Output 420 ml  Net 304 ml    LBM:   Baseline Weight: Weight: 45.3 kg Most recent weight: Weight: 45.3 kg     Palliative Assessment/Data:   Palliative performance scale 50%  Time In: 9 Time Out: 10 Time Total: 60 Greater than 50%  of this time was spent counseling and coordinating care related to  the above  assessment and plan.  Signed by: Loistine Chance, MD   Please contact Palliative Medicine Team phone at [phone] for questions and concerns.  For individual provider: See Shea Evans

## 2022-01-12 NOTE — Plan of Care (Signed)
  Problem: Education: Goal: Ability to describe self-care measures that may prevent or decrease complications (Diabetes Survival Skills Education) will improve Outcome: Progressing  Goal: Individualized Educational Video(s) Outcome: Progressing   Problem: Coping: Goal: Ability to adjust to condition or change in health will improve Outcome: Progressing   Problem: Fluid Volume: Goal: Ability to maintain a balanced intake and output will improve Outcome: Progressing   Problem: Health Behavior/Discharge Planning: Goal: Ability to identify and utilize available resources and services will improve Outcome: Progressing Patient is a resident of assisted living facility. Facility has different levels of care. Patient will be able to return to facility with increased level of care if needed upon discharge from hospital.  Goal: Ability to manage health-related needs will improve Outcome: Progressing   Problem: Metabolic: Goal: Ability to maintain appropriate glucose levels will improve Outcome: Progressing CBG has remained within acceptable limits. No insulin has been administered this shift.    Problem: Nutritional: Goal: Maintenance of adequate nutrition will improve Outcome: Progressing Goal: Progress toward achieving an optimal weight will improve Outcome: Progressing   Problem: Skin Integrity: Goal: Risk for impaired skin integrity will decrease Outcome: Progressing No open areas to skin, bony prominences protected. Repositioned in bed Q2H and PRN.    Problem: Tissue Perfusion: Goal: Adequacy of tissue perfusion will improve Outcome: Progressing   Problem: Education: Goal: Knowledge of General Education information will improve Description: Including pain rating scale, medication(s)/side effects and non-pharmacologic comfort measures Outcome: Progressing   Problem: Health Behavior/Discharge Planning: Goal: Ability to manage health-related needs will improve Outcome:  Progressing   Problem: Clinical Measurements: Goal: Ability to maintain clinical measurements within normal limits will improve Outcome: Progressing Goal: Will remain free from infection Outcome: Progressing Patient afebrile, no signs of infection at this time.  Goal: Diagnostic test results will improve Outcome: Progressing Goal: Respiratory complications will improve Outcome: Progressing Goal: Cardiovascular complication will be avoided Outcome: Progressing   Problem: Activity: Goal: Risk for activity intolerance will decrease Outcome: Progressing   Problem: Nutrition: Goal: Adequate nutrition will be maintained Outcome: Progressing   Problem: Coping: Goal: Level of anxiety will decrease Outcome: Progressing   Problem: Elimination: Goal: Will not experience complications related to bowel motility Outcome: Progressing Goal: Will not experience complications related to urinary retention Outcome: Progressing   Problem: Pain Managment: Goal: General experience of comfort will improve Outcome: Progressing Patient comfortable, denies pain and discomfort. She is resting well with no signs of distress.    Problem: Safety: Goal: Ability to remain free from injury will improve Outcome: Progressing High fall risk protocol maintained. No falls or injuries this shift.    Problem: Skin Integrity: Goal: Risk for impaired skin integrity will decrease Outcome: Progressing

## 2022-01-13 DIAGNOSIS — I10 Essential (primary) hypertension: Secondary | ICD-10-CM | POA: Diagnosis not present

## 2022-01-13 DIAGNOSIS — I341 Nonrheumatic mitral (valve) prolapse: Secondary | ICD-10-CM | POA: Diagnosis not present

## 2022-01-13 DIAGNOSIS — J9621 Acute and chronic respiratory failure with hypoxia: Secondary | ICD-10-CM | POA: Diagnosis not present

## 2022-01-13 DIAGNOSIS — I5031 Acute diastolic (congestive) heart failure: Secondary | ICD-10-CM

## 2022-01-13 DIAGNOSIS — E43 Unspecified severe protein-calorie malnutrition: Secondary | ICD-10-CM

## 2022-01-13 LAB — CBC WITH DIFFERENTIAL/PLATELET
Abs Immature Granulocytes: 0.04 10*3/uL (ref 0.00–0.07)
Basophils Absolute: 0.1 10*3/uL (ref 0.0–0.1)
Basophils Relative: 1 %
Eosinophils Absolute: 0.4 10*3/uL (ref 0.0–0.5)
Eosinophils Relative: 3 %
HCT: 40 % (ref 36.0–46.0)
Hemoglobin: 13.2 g/dL (ref 12.0–15.0)
Immature Granulocytes: 0 %
Lymphocytes Relative: 14 %
Lymphs Abs: 1.5 10*3/uL (ref 0.7–4.0)
MCH: 32.9 pg (ref 26.0–34.0)
MCHC: 33 g/dL (ref 30.0–36.0)
MCV: 99.8 fL (ref 80.0–100.0)
Monocytes Absolute: 0.8 10*3/uL (ref 0.1–1.0)
Monocytes Relative: 7 %
Neutro Abs: 8.1 10*3/uL — ABNORMAL HIGH (ref 1.7–7.7)
Neutrophils Relative %: 75 %
Platelets: 194 10*3/uL (ref 150–400)
RBC: 4.01 MIL/uL (ref 3.87–5.11)
RDW: 15.9 % — ABNORMAL HIGH (ref 11.5–15.5)
WBC: 10.9 10*3/uL — ABNORMAL HIGH (ref 4.0–10.5)
nRBC: 0 % (ref 0.0–0.2)

## 2022-01-13 LAB — GLUCOSE, CAPILLARY
Glucose-Capillary: 102 mg/dL — ABNORMAL HIGH (ref 70–99)
Glucose-Capillary: 138 mg/dL — ABNORMAL HIGH (ref 70–99)
Glucose-Capillary: 144 mg/dL — ABNORMAL HIGH (ref 70–99)
Glucose-Capillary: 149 mg/dL — ABNORMAL HIGH (ref 70–99)
Glucose-Capillary: 160 mg/dL — ABNORMAL HIGH (ref 70–99)
Glucose-Capillary: 189 mg/dL — ABNORMAL HIGH (ref 70–99)
Glucose-Capillary: 88 mg/dL (ref 70–99)

## 2022-01-13 LAB — MAGNESIUM: Magnesium: 2.3 mg/dL (ref 1.7–2.4)

## 2022-01-13 LAB — COMPREHENSIVE METABOLIC PANEL
ALT: 119 U/L — ABNORMAL HIGH (ref 0–44)
AST: 64 U/L — ABNORMAL HIGH (ref 15–41)
Albumin: 3.6 g/dL (ref 3.5–5.0)
Alkaline Phosphatase: 194 U/L — ABNORMAL HIGH (ref 38–126)
Anion gap: 10 (ref 5–15)
BUN: 36 mg/dL — ABNORMAL HIGH (ref 8–23)
CO2: 33 mmol/L — ABNORMAL HIGH (ref 22–32)
Calcium: 8.9 mg/dL (ref 8.9–10.3)
Chloride: 92 mmol/L — ABNORMAL LOW (ref 98–111)
Creatinine, Ser: 1.14 mg/dL — ABNORMAL HIGH (ref 0.44–1.00)
GFR, Estimated: 46 mL/min — ABNORMAL LOW (ref >60)
Glucose, Bld: 105 mg/dL — ABNORMAL HIGH (ref 70–99)
Potassium: 3.6 mmol/L (ref 3.5–5.1)
Sodium: 135 mmol/L (ref 135–145)
Total Bilirubin: 1.2 mg/dL (ref 0.3–1.2)
Total Protein: 6.2 g/dL — ABNORMAL LOW (ref 6.5–8.1)

## 2022-01-13 LAB — PHOSPHORUS: Phosphorus: 2.6 mg/dL (ref 2.5–4.6)

## 2022-01-13 LAB — PROCALCITONIN: Procalcitonin: <0.1 ng/mL

## 2022-01-13 LAB — T3: T3, Total: 68 ng/dL — ABNORMAL LOW (ref 71–180)

## 2022-01-13 MED ORDER — HEPARIN (PORCINE) 25000 UT/250ML-% IV SOLN
700.0000 [IU]/h | INTRAVENOUS | Status: DC
  Administered 2022-01-13: 600 [IU]/h via INTRAVENOUS
  Filled 2022-01-13: qty 250

## 2022-01-13 NOTE — Progress Notes (Signed)
PROGRESS NOTE    Emily Robertson  [id number] DOB: 1930-04-18 DOA: 01/10/2022 PCP: [name], FNP   Brief Narrative:  The patient is an 88 year old thin Caucasian female with a past medical history significant for but not limited to essential hypertension as well as dyslipidemia and other comorbidities who recently moved to independent living and was found to be confused and hypoxemic so she is brought to the ED for further evaluation.  In the ED she is noted be 80% on room air needing at least 3 L of supplemental oxygen BNP was elevated.  Cardiac enzymes were negative skeletal COVID is negative.  He was admitted due to her hypoxemia and altered mental status and cardiology was consulted.  Cardiology felt that her respiratory failure secondary to severe mitral regurgitation probably causing valvular heart disease and fluid overload.  She is initiated on diuresis with IV Lasix and continues to improve.  Cardiology is now talking to her about a MitraClip and she is going to think about it but her son is agreeable so the structural team will review her case tomorrow.   Assessment and Plan:  Acute hypoxemic respiratory failure, Severe mitral regurgitation probably causing valvular heart disease and fluid overload with concomitant acute on chronic diastolic CHF/HFpEF -Currently on IV Lasix, intake output monitoring.   -Some clinical improvement today. -Echocardiogram shows severe mitral regurgitation and tricuspid regurgitation.  Ejection fraction was normal. -Chest x-ray with persistent cardiomegaly and interstitial volume.   -Patient on Cardizem, metoprolol.   -Mitral valve regurgitation causing fluid overload.  She is currently followed at cardiology office for consideration of MitraClip procedure.   -Followed by cardiology in the hospital.   -Currently on IV diuresis with some clinical stabilization.  Cardiology recommends continuing IV diuresis -Patient is a flail P2 segment of the MV  and severe MR and is seeing Dr. Arty Baumgartner only for consideration for MitraClip -Cardiology is clinically concerned that her presentation of CHF will continue to happen unless intervention to the valve was made and patient's son is amenable for her case to be presented for MitraClip now -Structural heart team to evaluate tomorrow   Paroxysmal Atrial Fibrillation -Currently in A-fib.   -Rate controlled on metoprolol and Cardizem.   -Therapeutic on Eliquis per cardiology's and transition to heparin drip in case they have done invasively for her valve. -Continue to monitor on telemetry  Acute Metabolic Encephalopathy -Likely secondary to #1. -CT head was normal.   -CT angiogram without evidence of pneumonia or pulmonary embolism. -Urinalysis is clear, [id number] folic acid levels are normal. -Ammonia is normal. -Mental status is clearing up. -TSH 10 but she is now on methimazole   Steroid-induced hyperglycemia -Patient had a hypoglycemic episode after admission due to poor intake.  Continue monitoring.  Encourage oral intake.  We will keep on very sensitive sliding scale.   Essential hypertension -Stable on Cardizem and metoprolol. -Continue monitor blood pressures per protocol -Last blood pressure was  Hypothyroidism -TSH is 10, just started On methimazole. -Free T4 was 1.28 and T3 still in process  Rheumatoid Arthritis -On Arava.  Patient on maintenance prednisone 2.5 mg daily.  GERD/GI Prophylaxis -On Pepcid.   DVT prophylaxis: SCDs Start: 01/11/22 0006    Code Status: Full Code Family Communication: No family currently at bedside  Disposition Plan:  Level of care: Progressive Status is: Inpatient Remains inpatient appropriate because: This continues to get diuresed by cardiology and will be evaluated by the structural heart team    Consultants:  Cardiology  Palliative care medicine  Procedures:  As delineated as above  ECHOCARDIOGRAM IMPRESSIONS     1. Prolapse of  posterior MV leaflet with severe, eccentric MR.   2. Left ventricular ejection fraction, by estimation, is 60 to 65%. The  left ventricle has normal function. The left ventricle has no regional  wall motion abnormalities. Left ventricular diastolic parameters are  indeterminate.   3. Right ventricular systolic function is normal. The right ventricular  size is normal. There is mildly elevated pulmonary artery systolic  pressure.   4. Left atrial size was severely dilated.   5. Right atrial size was moderately dilated.   6. The mitral valve is abnormal. Severe mitral valve regurgitation. No  evidence of mitral stenosis. There is moderate late systolic prolapse of  post. leaflet of the mitral valve.   7. Tricuspid valve regurgitation is moderate to severe.   8. The aortic valve is tricuspid. Aortic valve regurgitation is mild.  Aortic valve sclerosis/calcification is present, without any evidence of  aortic stenosis.   9. The inferior vena cava is normal in size with greater than 50%  respiratory variability, suggesting right atrial pressure of 3 mmHg.   FINDINGS   Left Ventricle: Left ventricular ejection fraction, by estimation, is 60  to 65%. The left ventricle has normal function. The left ventricle has no  regional wall motion abnormalities. The left ventricular internal cavity  size was normal in size. There is   no left ventricular hypertrophy. Left ventricular diastolic parameters  are indeterminate.   Right Ventricle: The right ventricular size is normal. Right ventricular  systolic function is normal. There is mildly elevated pulmonary artery  systolic pressure. The tricuspid regurgitant velocity is 2.97 m/s, and  with an assumed right atrial pressure  of 3 mmHg, the estimated right ventricular systolic pressure is 0000000 mmHg.   Left Atrium: Left atrial size was severely dilated.   Right Atrium: Right atrial size was moderately dilated.   Pericardium: There is no evidence  of pericardial effusion.   Mitral Valve: The mitral valve is abnormal. There is moderate late  systolic prolapse of post. leaflet of the mitral valve. Mild mitral  annular calcification. Severe mitral valve regurgitation. No evidence of  mitral valve stenosis.   Tricuspid Valve: The tricuspid valve is normal in structure. Tricuspid  valve regurgitation is moderate to severe. No evidence of tricuspid  stenosis.   Aortic Valve: The aortic valve is tricuspid. Aortic valve regurgitation is  mild. Aortic regurgitation PHT measures 819 msec. Aortic valve  sclerosis/calcification is present, without any evidence of aortic  stenosis. Aortic valve mean gradient measures 2.0   mmHg. Aortic valve peak gradient measures 3.5 mmHg. Aortic valve area, by  VTI measures 2.24 cm.   Pulmonic Valve: The pulmonic valve was normal in structure. Pulmonic valve  regurgitation is trivial. No evidence of pulmonic stenosis.   Aorta: The aortic root is normal in size and structure.   Venous: The inferior vena cava is normal in size with greater than 50%  respiratory variability, suggesting right atrial pressure of 3 mmHg.   IAS/Shunts: No atrial level shunt detected by color flow Doppler.   Additional Comments: Prolapse of posterior MV leaflet with severe,  eccentric MR.     LEFT VENTRICLE  PLAX 2D  LVIDd:         4.00 cm  LVIDs:         2.90 cm  LV PW:         1.10 cm  LV IVS:        1.10 cm  LVOT diam:     2.00 cm  LV SV:         28  LV SV Index:   20  LVOT Area:     3.14 cm    LV Volumes (MOD)  LV vol d, MOD A2C: 57.6 ml  LV vol d, MOD A4C: 44.8 ml  LV vol s, MOD A2C: 25.5 ml  LV vol s, MOD A4C: 21.2 ml  LV SV MOD A2C:     32.1 ml  LV SV MOD A4C:     44.8 ml  LV SV MOD BP:      31.1 ml   RIGHT VENTRICLE  RV Basal diam:  4.40 cm  RV Mid diam:    3.80 cm  RV S prime:     7.18 cm/s  TAPSE (M-mode): 1.6 cm   LEFT ATRIUM              Index        RIGHT ATRIUM           Index  LA diam:         2.80 cm  1.94 cm/m   RA Area:     20.70 cm  LA Vol (A2C):   101.0 ml 69.83 ml/m  RA Volume:   56.60 ml  39.13 ml/m  LA Vol (A4C):   83.6 ml  57.80 ml/m  LA Biplane Vol: 96.5 ml  66.72 ml/m   AORTIC VALVE                    PULMONIC VALVE  AV Area (Vmax):    1.72 cm     PV Vmax:       0.56 m/s  AV Area (Vmean):   1.79 cm     PV Peak grad:  1.2 mmHg  AV Area (VTI):     2.24 cm  AV Vmax:           93.30 cm/s  AV Vmean:          57.200 cm/s  AV VTI:            0.126 m  AV Peak Grad:      3.5 mmHg  AV Mean Grad:      2.0 mmHg  LVOT Vmax:         51.00 cm/s  LVOT Vmean:        32.600 cm/s  LVOT VTI:          0.090 m  LVOT/AV VTI ratio: 0.71  AI PHT:            819 msec    AORTA  Ao Root diam: 3.30 cm  Ao Asc diam:  3.30 cm   MITRAL VALVE                  TRICUSPID VALVE  MV Area (PHT): 4.36 cm       TR Peak grad:   35.3 mmHg  MV Decel Time: 174 msec       TR Vmax:        297.00 cm/s  MR Peak grad:    75.9 mmHg  MR Mean grad:    57.0 mmHg    SHUNTS  MR Vmax:         435.50 cm/s  Systemic VTI:  0.09 m  MR Vmean:        355.0 cm/s   Systemic  Diam: 2.00 cm  MR PISA:         6.28 cm  MR PISA Eff ROA: 33 mm  MR PISA Radius:  1.00 cm  MV E velocity: 93.80 cm/s  MV A velocity: 46.30 cm/s  MV E/A ratio:  2.03    Antimicrobials:  Anti-infectives (From admission, onward)    None       Subjective: Seen and examined at bedside and she is doing okay sitting in the chair.  Felt okay and now she is bleeding.  Denies any nausea or vomiting.  Per the cardiology team she is much more awake and alert today than she was yesterday.  No other concerns or complaints this time.  Objective: Vitals:   01/12/22 2000 01/13/22 0453 01/13/22 0500 01/13/22 0904  BP: 119/76 126/79  119/73  Pulse: (!) 54 90  84  Resp: 20 (!) 21  20  Temp: 97.7 F (36.5 C) (!) 97.4 F (36.3 C)    TempSrc: Oral Axillary    SpO2: 92% 96%  96%  Weight:   43.2 kg   Height:        Intake/Output  Summary (Last 24 hours) at 01/13/2022 1711 Last data filed at 01/13/2022 0501 Gross per 24 hour  Intake 480 ml  Output 1950 ml  Net -1470 ml   Filed Weights   01/12/22 0451 01/13/22 0500  Weight: 45.3 kg 43.2 kg   Examination: Physical Exam:  Constitutional: Thin elderly Caucasian female currently no acute distress sitting in the chair bedside Respiratory: Diminished to auscultation bilaterally, no wheezing, rales, rhonchi or crackles. Normal respiratory effort and patient is not tachypenic. No accessory muscle use.  Unlabored breathing Cardiovascular: RRR, no murmurs / rubs / gallops. S1 and S2 auscultated.  Mild lower extremity edema Abdomen: Soft, non-tender, non-distended. Bowel sounds positive.  GU: Deferred. Musculoskeletal: No clubbing / cyanosis of digits/nails. No joint deformity upper and lower extremities.  Skin: No rashes, lesions, ulcers on limited skin evaluation. No induration; Warm and dry.  Neurologic: CN 2-12 grossly intact with no focal deficits.  Romberg sign and cerebellar reflexes not assessed.  Psychiatric: Normal judgment and insight. Alert and oriented x 3. Normal mood and appropriate affect.   Data Reviewed: I have personally reviewed following labs and imaging studies  CBC: Recent Labs  Lab 01/10/22 1941 01/11/22 0401 01/13/22 0909  WBC 12.0* 10.4 10.9*  NEUTROABS 9.5* 7.8* 8.1*  HGB 14.3 13.5 13.2  HCT 43.2 40.5 40.0  MCV 100.5* 99.3 99.8  PLT 198 184 [id number]   Basic Metabolic Panel: Recent Labs  Lab 01/10/22 1941 01/11/22 0401 01/12/22 0541 01/13/22 0909  NA 131*  --  136 135  K 4.2  --  4.3 3.6  CL 95*  --  98 92*  CO2 23  --  25 33*  GLUCOSE 158*  --  109* 105*  BUN 40*  --  37* 36*  CREATININE 1.28*  --  1.17* 1.14*  CALCIUM 8.9  --  8.6* 8.9  MG  --  2.0  --  2.3  PHOS  --   --   --  2.6   GFR: Estimated Creatinine Clearance: 22.8 mL/min (A) (by C-G formula based on SCr of 1.14 mg/dL (H)). Liver Function Tests: Recent Labs  Lab  01/10/22 1941 01/13/22 0909  AST 114* 64*  ALT 147* 119*  ALKPHOS 230* 194*  BILITOT 1.3* 1.2  PROT 6.2* 6.2*  ALBUMIN 3.7 3.6   No results for input(s): "LIPASE", "AMYLASE" in  the last 168 hours. Recent Labs  Lab 01/11/22 0656  AMMONIA 17   Coagulation Profile: No results for input(s): "INR", "PROTIME" in the last 168 hours. Cardiac Enzymes: No results for input(s): "CKTOTAL", "CKMB", "CKMBINDEX", "TROPONINI" in the last 168 hours. BNP (last 3 results) No results for input(s): "PROBNP" in the last 8760 hours. HbA1C: No results for input(s): "HGBA1C" in the last 72 hours. CBG: Recent Labs  Lab 01/13/22 0029 01/13/22 0420 01/13/22 0750 01/13/22 1132 01/13/22 1628  GLUCAP 160* 102* 88 189* 144*   Lipid Profile: No results for input(s): "CHOL", "HDL", "LDLCALC", "TRIG", "CHOLHDL", "LDLDIRECT" in the last 72 hours. Thyroid Function Tests: Recent Labs    01/10/22 1941 01/11/22 0111  TSH 10.519*  --   FREET4  --  1.28*   Anemia Panel: Recent Labs    01/11/22 0401  VITAMINB12 2,870*  FOLATE 33.5   Sepsis Labs: Recent Labs  Lab 01/11/22 1330 01/11/22 1745 01/12/22 0541 01/13/22 0432  PROCALCITON <0.10  --  0.10 <0.10  LATICACIDVEN 2.3* 2.0*  --   --     Recent Results (from the past 240 hour(s))  Resp Panel by RT-PCR (Flu A&B, Covid) Anterior Nasal Swab     Status: None   Collection Time: 01/10/22  7:41 PM   Specimen: Anterior Nasal Swab  Result Value Ref Range Status   SARS Coronavirus 2 by RT PCR NEGATIVE NEGATIVE Final    Comment: (NOTE) SARS-CoV-2 target nucleic acids are NOT DETECTED.  The SARS-CoV-2 RNA is generally detectable in upper respiratory specimens during the acute phase of infection. The lowest concentration of SARS-CoV-2 viral copies this assay can detect is 138 copies/mL. A negative result does not preclude SARS-Cov-2 infection and should not be used as the sole basis for treatment or other patient management decisions. A negative  result may occur with  improper specimen collection/handling, submission of specimen other than nasopharyngeal swab, presence of viral mutation(s) within the areas targeted by this assay, and inadequate number of viral copies(<138 copies/mL). A negative result must be combined with clinical observations, patient history, and epidemiological information. The expected result is Negative.  Fact Sheet for Patients:  BloggerCourse.com  Fact Sheet for Healthcare Providers:  SeriousBroker.it  This test is no t yet approved or cleared by the [place] FDA and  has been authorized for detection and/or diagnosis of SARS-CoV-2 by FDA under an Emergency Use Authorization (EUA). This EUA will remain  in effect (meaning this test can be used) for the duration of the COVID-19 declaration under Section 564(b)(1) of the Act, 21 U.S.C.section 360bbb-3(b)(1), unless the authorization is terminated  or revoked sooner.       Influenza A by PCR NEGATIVE NEGATIVE Final   Influenza B by PCR NEGATIVE NEGATIVE Final    Comment: (NOTE) The Xpert Xpress SARS-CoV-2/FLU/RSV plus assay is intended as an aid in the diagnosis of influenza from Nasopharyngeal swab specimens and should not be used as a sole basis for treatment. Nasal washings and aspirates are unacceptable for Xpert Xpress SARS-CoV-2/FLU/RSV testing.  Fact Sheet for Patients: BloggerCourse.com  Fact Sheet for Healthcare Providers: SeriousBroker.it  This test is not yet approved or cleared by the [place] FDA and has been authorized for detection and/or diagnosis of SARS-CoV-2 by FDA under an Emergency Use Authorization (EUA). This EUA will remain in effect (meaning this test can be used) for the duration of the COVID-19 declaration under Section 564(b)(1) of the Act, 21 U.S.C. section 360bbb-3(b)(1), unless the authorization is  terminated  or revoked.  Performed at [facility], [place] [address] [place], [place] [address]      Radiology Studies: DG CHEST PORT 1 VIEW  Result Date: 01/11/2022 CLINICAL DATA:  Hypoxemia EXAM: PORTABLE CHEST 1 VIEW COMPARISON:  Chest x-ray 01/10/2022.  CT of the chest 11 20. FINDINGS: Small pleural effusions persist. The heart is enlarged. There central pulmonary vascular congestion. There is no lung consolidation or pneumothorax. No acute fractures are seen. IMPRESSION: Cardiomegaly with central pulmonary vascular congestion and small bilateral pleural effusions. Electronically Signed   By: Ronney Asters M.D.   On: 01/11/2022 18:51    Scheduled Meds:  diltiazem  180 mg Oral Daily   famotidine  20 mg Oral BID   feeding supplement  237 mL Oral TID BM   furosemide  80 mg Intravenous Q12H   insulin aspart  0-6 Units Subcutaneous Q4H   leflunomide  10 mg Oral q morning   metoprolol succinate  150 mg Oral q morning   montelukast  10 mg Oral Daily   predniSONE  2.5 mg Oral Q breakfast   sodium chloride flush  3 mL Intravenous Q12H   spironolactone  12.5 mg Oral Daily   Continuous Infusions:  sodium chloride     heparin      LOS: 3 days   Raiford Noble, DO [facility] Available via Epic secure chat 7am-7pm After these hours, please refer to coverage provider listed on [email or web address] 01/13/2022, 5:11 PM

## 2022-01-13 NOTE — Progress Notes (Signed)
Rounding Note    Patient Name: Emily Robertson Date of Encounter: 01/13/2022  [facility] Cardiologist: Olga Millers, MD   Subjective   No CP   Breathing is OK while sitting   Pt alert initially then got sleepy/drowsy while talking     Hard of hearing     Inpatient Medications    Scheduled Meds:  apixaban  2.5 mg Oral BID   diltiazem  180 mg Oral Daily   famotidine  20 mg Oral BID   feeding supplement  237 mL Oral TID BM   furosemide  80 mg Intravenous Q12H   insulin aspart  0-6 Units Subcutaneous Q4H   leflunomide  10 mg Oral q morning   metoprolol succinate  150 mg Oral q morning   montelukast  10 mg Oral Daily   predniSONE  2.5 mg Oral Q breakfast   sodium chloride flush  3 mL Intravenous Q12H   spironolactone  12.5 mg Oral Daily   Continuous Infusions:  sodium chloride     PRN Meds: sodium chloride, acetaminophen, ondansetron (ZOFRAN) IV, mouth rinse, sodium chloride flush   Vital Signs    Vitals:   01/12/22 1715 01/12/22 2000 01/13/22 0453 01/13/22 0500  BP:  119/76 126/79   Pulse:  (!) 54 90   Resp:  20 (!) 21   Temp:  97.7 F (36.5 C) (!) 97.4 F (36.3 C)   TempSrc:  Oral Axillary   SpO2: 96% 92% 96%   Weight:    43.2 kg  Height:        Intake/Output Summary (Last 24 hours) at 01/13/2022 0736 Last data filed at 01/13/2022 0501 Gross per 24 hour  Intake 720 ml  Output 2250 ml  Net -1530 ml      01/13/2022    5:00 AM 01/12/2022    4:51 AM 12/17/2021    3:12 PM  Last 3 Weights  Weight (lbs) 95 lb 3.8 oz 99 lb 13.9 oz 101 lb  Weight (kg) 43.2 kg 45.3 kg 45.813 kg      Telemetry    Afib  80s   - Personally Reviewed  ECG    No new  - Personally Reviewed  Physical Exam   GEN: Thin 88 yo in no acute distress.   Neck: JVP increased   Cardiac: Irreg irreg   No S3  ,  III/VI blowing systolic murmur LSB to apex   Respiratory: Relatively clear    GI: Soft, nontender, non-distended  MS: No edema; No deformity. Neuro:  Nonfocal   Psych: Normal affect   Labs    High Sensitivity Troponin:   Recent Labs  Lab 01/10/22 1941 01/10/22 2156  TROPONINIHS 12 11     Chemistry Recent Labs  Lab 01/10/22 1941 01/11/22 0401 01/12/22 0541  NA 131*  --  136  K 4.2  --  4.3  CL 95*  --  98  CO2 23  --  25  GLUCOSE 158*  --  109*  BUN 40*  --  37*  CREATININE 1.28*  --  1.17*  CALCIUM 8.9  --  8.6*  MG  --  2.0  --   PROT 6.2*  --   --   ALBUMIN 3.7  --   --   AST 114*  --   --   ALT 147*  --   --   ALKPHOS 230*  --   --   BILITOT 1.3*  --   --  GFRNONAA 40*  --  45*  ANIONGAP 13  --  13    Lipids No results for input(s): "CHOL", "TRIG", "HDL", "LABVLDL", "LDLCALC", "CHOLHDL" in the last 168 hours.  Hematology Recent Labs  Lab 01/10/22 1941 01/11/22 0401  WBC 12.0* 10.4  RBC 4.30 4.08  HGB 14.3 13.5  HCT 43.2 40.5  MCV 100.5* 99.3  MCH 33.3 33.1  MCHC 33.1 33.3  RDW 16.2* 15.9*  PLT 198 184   Thyroid  Recent Labs  Lab 01/10/22 1941 01/11/22 0111  TSH 10.519*  --   FREET4  --  1.28*    BNP Recent Labs  Lab 01/10/22 1941  BNP 1,851.3*    DDimer No results for input(s): "DDIMER" in the last 168 hours.   Radiology    DG CHEST PORT 1 VIEW  Result Date: 01/11/2022 CLINICAL DATA:  Hypoxemia EXAM: PORTABLE CHEST 1 VIEW COMPARISON:  Chest x-ray 01/10/2022.  CT of the chest 11 20. FINDINGS: Small pleural effusions persist. The heart is enlarged. There central pulmonary vascular congestion. There is no lung consolidation or pneumothorax. No acute fractures are seen. IMPRESSION: Cardiomegaly with central pulmonary vascular congestion and small bilateral pleural effusions. Electronically Signed   By: Ronney Asters M.D.   On: 01/11/2022 18:51   ECHOCARDIOGRAM COMPLETE  Result Date: 01/11/2022    ECHOCARDIOGRAM REPORT   Patient Name:   Emily Robertson Date of Exam: 01/11/2022 Medical Rec #:  [id number]         Height:       63.0 in Accession #:    [id number]        Weight:       101.0 lb Date of  Birth:  1930-12-16         BSA:          1.446 m Patient Age:    13 years          BP:           107/95 mmHg Patient Gender: F                 HR:           88 bpm. Exam Location:  Inpatient Procedure: 2D Echo Indications:    CHF  History:        Patient has prior history of Echocardiogram examinations, most                 recent 07/26/2021. CHF, Mitral Valve Prolapse, Arrythmias:Atrial                 Fibrillation; Risk Factors:Hypertension.  Sonographer:    Harvie Junior Referring Phys: [id number] [facility]  Sonographer Comments: Restraints. IMPRESSIONS  1. Prolapse of posterior MV leaflet with severe, eccentric MR.  2. Left ventricular ejection fraction, by estimation, is 60 to 65%. The left ventricle has normal function. The left ventricle has no regional wall motion abnormalities. Left ventricular diastolic parameters are indeterminate.  3. Right ventricular systolic function is normal. The right ventricular size is normal. There is mildly elevated pulmonary artery systolic pressure.  4. Left atrial size was severely dilated.  5. Right atrial size was moderately dilated.  6. The mitral valve is abnormal. Severe mitral valve regurgitation. No evidence of mitral stenosis. There is moderate late systolic prolapse of post. leaflet of the mitral valve.  7. Tricuspid valve regurgitation is moderate to severe.  8. The aortic valve is tricuspid. Aortic valve regurgitation is mild. Aortic valve sclerosis/calcification is present, without  any evidence of aortic stenosis.  9. The inferior vena cava is normal in size with greater than 50% respiratory variability, suggesting right atrial pressure of 3 mmHg. FINDINGS  Left Ventricle: Left ventricular ejection fraction, by estimation, is 60 to 65%. The left ventricle has normal function. The left ventricle has no regional wall motion abnormalities. The left ventricular internal cavity size was normal in size. There is  no left ventricular hypertrophy. Left ventricular  diastolic parameters are indeterminate. Right Ventricle: The right ventricular size is normal. Right ventricular systolic function is normal. There is mildly elevated pulmonary artery systolic pressure. The tricuspid regurgitant velocity is 2.97 m/s, and with an assumed right atrial pressure of 3 mmHg, the estimated right ventricular systolic pressure is 0000000 mmHg. Left Atrium: Left atrial size was severely dilated. Right Atrium: Right atrial size was moderately dilated. Pericardium: There is no evidence of pericardial effusion. Mitral Valve: The mitral valve is abnormal. There is moderate late systolic prolapse of post. leaflet of the mitral valve. Mild mitral annular calcification. Severe mitral valve regurgitation. No evidence of mitral valve stenosis. Tricuspid Valve: The tricuspid valve is normal in structure. Tricuspid valve regurgitation is moderate to severe. No evidence of tricuspid stenosis. Aortic Valve: The aortic valve is tricuspid. Aortic valve regurgitation is mild. Aortic regurgitation PHT measures 819 msec. Aortic valve sclerosis/calcification is present, without any evidence of aortic stenosis. Aortic valve mean gradient measures 2.0  mmHg. Aortic valve peak gradient measures 3.5 mmHg. Aortic valve area, by VTI measures 2.24 cm. Pulmonic Valve: The pulmonic valve was normal in structure. Pulmonic valve regurgitation is trivial. No evidence of pulmonic stenosis. Aorta: The aortic root is normal in size and structure. Venous: The inferior vena cava is normal in size with greater than 50% respiratory variability, suggesting right atrial pressure of 3 mmHg. IAS/Shunts: No atrial level shunt detected by color flow Doppler. Additional Comments: Prolapse of posterior MV leaflet with severe, eccentric MR.  LEFT VENTRICLE PLAX 2D LVIDd:         4.00 cm LVIDs:         2.90 cm LV PW:         1.10 cm LV IVS:        1.10 cm LVOT diam:     2.00 cm LV SV:         28 LV SV Index:   20 LVOT Area:     3.14 cm  LV  Volumes (MOD) LV vol d, MOD A2C: 57.6 ml LV vol d, MOD A4C: 44.8 ml LV vol s, MOD A2C: 25.5 ml LV vol s, MOD A4C: 21.2 ml LV SV MOD A2C:     32.1 ml LV SV MOD A4C:     44.8 ml LV SV MOD BP:      31.1 ml RIGHT VENTRICLE RV Basal diam:  4.40 cm RV Mid diam:    3.80 cm RV S prime:     7.18 cm/s TAPSE (M-mode): 1.6 cm LEFT ATRIUM              Index        RIGHT ATRIUM           Index LA diam:        2.80 cm  1.94 cm/m   RA Area:     20.70 cm LA Vol (A2C):   101.0 ml 69.83 ml/m  RA Volume:   56.60 ml  39.13 ml/m LA Vol (A4C):   83.6 ml  57.80 ml/m LA Biplane Vol:  96.5 ml  66.72 ml/m  AORTIC VALVE                    PULMONIC VALVE AV Area (Vmax):    1.72 cm     PV Vmax:       0.56 m/s AV Area (Vmean):   1.79 cm     PV Peak grad:  1.2 mmHg AV Area (VTI):     2.24 cm AV Vmax:           93.30 cm/s AV Vmean:          57.200 cm/s AV VTI:            0.126 m AV Peak Grad:      3.5 mmHg AV Mean Grad:      2.0 mmHg LVOT Vmax:         51.00 cm/s LVOT Vmean:        32.600 cm/s LVOT VTI:          0.090 m LVOT/AV VTI ratio: 0.71 AI PHT:            819 msec  AORTA Ao Root diam: 3.30 cm Ao Asc diam:  3.30 cm MITRAL VALVE                  TRICUSPID VALVE MV Area (PHT): 4.36 cm       TR Peak grad:   35.3 mmHg MV Decel Time: 174 msec       TR Vmax:        297.00 cm/s MR Peak grad:    75.9 mmHg MR Mean grad:    57.0 mmHg    SHUNTS MR Vmax:         435.50 cm/s  Systemic VTI:  0.09 m MR Vmean:        355.0 cm/s   Systemic Diam: 2.00 cm MR PISA:         6.28 cm MR PISA Eff ROA: 33 mm MR PISA Radius:  1.00 cm MV E velocity: 93.80 cm/s MV A velocity: 46.30 cm/s MV E/A ratio:  2.03 Kirk Ruths MD Electronically signed by Kirk Ruths MD Signature Date/Time: 01/11/2022/4:23:51 PM    Final     Cardiac Studies   : Transesophageal echo dated 09/02/2021, flail P2 segment, severe mitral valve regurgitation, LV function 65-70%    Patient Profile      : 88 year old female with history of hypertension, hyperlipidemia, severe  mitral valve regurgitation, persistent atrial fibrillation, hyperthyroidism and rheumatoid arthritis who was admitted on 01/10/2022 for acute hypoxic respiratory failure secondary to volume overload.   Assessment & Plan    1  HFpEF   Pt with severe MR that is probable etiology of CHF   She presented with acute resp failure and volume overload    She is diuresing and has improved   Today she is more alert, in chair.  She actually does not remember how she got here or what happened. (Son says she is usually not like this with confusion)    I would keep on IV iasix   Renal function is steady      2  Mitral regurgitation.   Pt with flail P2 segment of MV and severe MR   Pt has seen Drs Ali Lowe and Farris Has for consideration of MitraClip.    Son said patient would be interested but was concerned about thyroid    I have reviewed with son (on phone)   I am concerned that  this presentation of CHF will continue t ohappen unless intervention to valve is made     He agrees   is amenable to have case presented for [facility] now    Does not want to see his mom go through this again  Will review with structural team tomorrow       2  Atrial fibrillation  Rates controlled    Continue current Rx  Continue tele   Will transition to heparin in case something done invasively     3  Hx hyperthyroidism  TSH 10   Free T4 1.28 (minimally   T3 still in in process     For questions or updates, please contact [facility] Please consult [email or web address] for contact info under        Signed, Dorris Carnes, MD  01/13/2022, 7:36 AM

## 2022-01-13 NOTE — Progress Notes (Signed)
ANTICOAGULATION CONSULT NOTE - Initial Consult  Pharmacy Consult for Heparin while Eliquis on hold Indication: atrial fibrillation  Allergies  Allergen Reactions   Cozaar [Losartan] Swelling    Tongue swelling   Erythromycin Itching   Azopt [Brinzolamide] Rash    Rash on eyelid    Patient Measurements: Height: 5\' 3"  (160 cm) Weight: 43.2 kg (95 lb 3.8 oz) IBW/kg (Calculated) : 52.4 Heparin Dosing Weight: actual weight  Vital Signs: Temp: 97.4 F (36.3 C) (12/03 0453) Temp Source: Axillary (12/03 0453) BP: 119/73 (12/03 0904) Pulse Rate: 84 (12/03 0904)  Labs: Recent Labs    01/10/22 1941 01/10/22 2156 01/11/22 0401 01/12/22 0541 01/13/22 0909  HGB 14.3  --  13.5  --  13.2  HCT 43.2  --  40.5  --  40.0  PLT 198  --  184  --  194  CREATININE 1.28*  --   --  1.17* 1.14*  TROPONINIHS 12 11  --   --   --     Estimated Creatinine Clearance: 22.8 mL/min (A) (by C-G formula based on SCr of 1.14 mg/dL (H)).   Medical History: Past Medical History:  Diagnosis Date   Arthritis    Glaucoma of both eyes    Hyperlipidemia    Hypertension    Pelvic prolapse    anterior floor   Urgency of urination    Urinary hesitancy    Urticaria     Medications:  Scheduled:   diltiazem  180 mg Oral Daily   famotidine  20 mg Oral BID   feeding supplement  237 mL Oral TID BM   furosemide  80 mg Intravenous Q12H   insulin aspart  0-6 Units Subcutaneous Q4H   leflunomide  10 mg Oral q morning   metoprolol succinate  150 mg Oral q morning   montelukast  10 mg Oral Daily   predniSONE  2.5 mg Oral Q breakfast   sodium chloride flush  3 mL Intravenous Q12H   spironolactone  12.5 mg Oral Daily   Infusions:   sodium chloride     PRN: sodium chloride, acetaminophen, ondansetron (ZOFRAN) IV, mouth rinse, sodium chloride flush  Assessment: 88 yo female on chronic Eliquis for afib, admitted with acute hypoxic respiratory failure secondary to volume overload. Cardiology consulted,  transitioning to IV heparin in case invasive procedures needed.  Goal of Therapy:  PTT 66-102 seconds Heparin level 0.3-0.7 units/ml Monitor platelets by anticoagulation protocol: Yes   Plan:  No heparin bolus since received Eliquis at 09:07 this AM Start heparin infusion at 600 units/hr to start at 18:00 tonight Check aPTT and heparin level 8hr after starting Check daily aPTT/HL, CBC Monitor closely for signs/symptoms of bleeding  92, PharmD, BCPS Pharmacy: [phone] 01/13/2022,12:54 PM

## 2022-01-13 NOTE — Evaluation (Signed)
Occupational Therapy Evaluation Patient Details Name: Emily Robertson MRN: [id number] DOB: October 13, 1930 Today's Date: 01/13/2022   History of Present Illness 88 year old female with history of essential hypertension and dyslipidemia who recently moved to independent living who was found confused and hypoxemic so brought to ER.  In the emergency room noted to be 80% on room air needing 3 L of oxygen.  BNP 1800.  Cardiac enzymes negative.  Skeletal survey negative.  Admitted due to hypoxemia, altered mental status.   Clinical Impression   Patient presents with impaired balance, generalized weakness, decreased activity tolerance and impaired cardiopulmonary endurance. On evaluation she was min assist to supervision for ADLs - toileting and standing grooming tasks. She was min guard with walker to ambulate in room. Patient found on RA and therapist attempted to get accurate o2 sat but pleth signal poor - though o2 sat was reading in the 90s. Patient did not appear short of breath nor report it. Will follow acutely while in hospital to maintain and/or progress functional abilities. Recommend HH OT at discharge.      Recommendations for follow up therapy are one component of a multi-disciplinary discharge planning process, led by the attending physician.  Recommendations may be updated based on patient status, additional functional criteria and insurance authorization.   Follow Up Recommendations  Home health OT     Assistance Recommended at Discharge Intermittent Supervision/Assistance  Patient can return home with the following A little help with bathing/dressing/bathroom;Assistance with cooking/housework;Direct supervision/assist for financial management;Direct supervision/assist for medications management;Help with stairs or ramp for entrance    Functional Status Assessment  Patient has had a recent decline in their functional status and demonstrates the ability to make significant improvements  in function in a reasonable and predictable amount of time.  Equipment Recommendations  None recommended by OT    Recommendations for Other Services       Precautions / Restrictions Precautions Precautions: Fall Precaution Comments: monitor o2 sats Restrictions Weight Bearing Restrictions: No      Mobility Bed Mobility                    Transfers                          Balance Overall balance assessment: Mild deficits observed, not formally tested                                         ADL either performed or assessed with clinical judgement   ADL Overall ADL's : Needs assistance/impaired Eating/Feeding: Independent   Grooming: Supervision/safety;Standing   Upper Body Bathing: Set up;Sitting   Lower Body Bathing: Set up;Sit to/from stand   Upper Body Dressing : Set up;Sitting   Lower Body Dressing: Minimal assistance;Sit to/from stand   Toilet Transfer: Supervision/safety;Regular Toilet;Rolling walker (2 wheels)   Toileting- Architect and Hygiene: Supervision/safety;Sit to/from stand Toileting - Clothing Manipulation Details (indicate cue type and reason): Able to wipe self. Needed asistance for quality of cleaning     Functional mobility during ADLs: Min guard;Rolling walker (2 wheels) General ADL Comments: Patient found on toielt when therapist entered the room. patient able to wipe herself multiple times, stand as needed for toileting, stand at sink to wash hands and ambualte with walker without loss of balance.     Vision Baseline Vision/History: 1 Wears glasses Ability  to See in Adequate Light: 1 Impaired Patient Visual Report: No change from baseline       Perception     Praxis      Pertinent Vitals/Pain Pain Assessment Pain Assessment: No/denies pain     Hand Dominance Right   Extremity/Trunk Assessment Upper Extremity Assessment Upper Extremity Assessment: Overall WFL for tasks assessed    Lower Extremity Assessment Lower Extremity Assessment: Defer to PT evaluation   Cervical / Trunk Assessment Cervical / Trunk Assessment: Kyphotic   Communication Communication Communication: No difficulties   Cognition Arousal/Alertness: Awake/alert Behavior During Therapy: WFL for tasks assessed/performed Overall Cognitive Status: Within Functional Limits for tasks assessed                                       General Comments  Patient found on RA. Attempted to monitor sat after activity - increased time and poor pleth signal but o2 sat above 90%. Patient did not complain of shortness of breath or appear dyspneic.    Exercises     Shoulder Instructions      Home Living Family/patient expects to be discharged to::  (independent living at [facility])                                 Additional Comments: [facility] Living at [facility]      Prior Functioning/Environment Prior Level of Function : Independent/Modified Independent             Mobility Comments: uses rollator ADLs Comments: uses rollator to walk to evelvator then to the dining hall. Also rollator in her apartment. Independent with ADLs        OT Problem List: Decreased activity tolerance;Cardiopulmonary status limiting activity;Impaired balance (sitting and/or standing);Decreased safety awareness      OT Treatment/Interventions: Self-care/ADL training;DME and/or AE instruction;Energy conservation;Therapeutic activities;Patient/family education;Therapeutic exercise    OT Goals(Current goals can be found in the care plan section) Acute Rehab OT Goals Patient Stated Goal: move around more OT Goal Formulation: With patient Time For Goal Achievement: 01/27/22 Potential to Achieve Goals: Good  OT Frequency: Min 2X/week    Co-evaluation              AM-PAC OT "6 Clicks" Daily Activity     Outcome Measure Help from another person eating meals?: None Help from  another person taking care of personal grooming?: A Little Help from another person toileting, which includes using toliet, bedpan, or urinal?: A Little Help from another person bathing (including washing, rinsing, drying)?: A Little Help from another person to put on and taking off regular upper body clothing?: A Little Help from another person to put on and taking off regular lower body clothing?: A Little 6 Click Score: 19   End of Session Equipment Utilized During Treatment: Rolling walker (2 wheels) Nurse Communication: Mobility status  Activity Tolerance: Patient tolerated treatment well Patient left: in chair;with call bell/phone within reach;with chair alarm set  OT Visit Diagnosis: Unsteadiness on feet (R26.81)                Time: 9381-8299 OT Time Calculation (min): 11 min Charges:  OT General Charges $OT Visit: 1 Visit OT Evaluation $OT Eval Low Complexity: 1 Low  Donnella Sham, OTR/L Acute Care Rehab Services  Office [phone]   Kelli Churn 01/13/2022, 1:07 PM

## 2022-01-14 DIAGNOSIS — R21 Rash and other nonspecific skin eruption: Secondary | ICD-10-CM

## 2022-01-14 DIAGNOSIS — Z515 Encounter for palliative care: Secondary | ICD-10-CM

## 2022-01-14 DIAGNOSIS — I4821 Permanent atrial fibrillation: Secondary | ICD-10-CM

## 2022-01-14 DIAGNOSIS — I340 Nonrheumatic mitral (valve) insufficiency: Secondary | ICD-10-CM | POA: Diagnosis not present

## 2022-01-14 DIAGNOSIS — I5031 Acute diastolic (congestive) heart failure: Secondary | ICD-10-CM | POA: Diagnosis not present

## 2022-01-14 DIAGNOSIS — I10 Essential (primary) hypertension: Secondary | ICD-10-CM | POA: Diagnosis not present

## 2022-01-14 DIAGNOSIS — Z7189 Other specified counseling: Secondary | ICD-10-CM

## 2022-01-14 DIAGNOSIS — J9621 Acute and chronic respiratory failure with hypoxia: Secondary | ICD-10-CM | POA: Diagnosis not present

## 2022-01-14 DIAGNOSIS — I341 Nonrheumatic mitral (valve) prolapse: Secondary | ICD-10-CM | POA: Diagnosis not present

## 2022-01-14 LAB — GLUCOSE, CAPILLARY
Glucose-Capillary: 91 mg/dL (ref 70–99)
Glucose-Capillary: 160 mg/dL — ABNORMAL HIGH (ref 70–99)
Glucose-Capillary: 98 mg/dL (ref 70–99)
Glucose-Capillary: 174 mg/dL — ABNORMAL HIGH (ref 70–99)

## 2022-01-14 LAB — CBC WITH DIFFERENTIAL/PLATELET
Abs Immature Granulocytes: 0.06 10*3/uL (ref 0.00–0.07)
Basophils Absolute: 0.1 10*3/uL (ref 0.0–0.1)
Basophils Relative: 1 %
Eosinophils Absolute: 0.4 10*3/uL (ref 0.0–0.5)
Eosinophils Relative: 4 %
HCT: 38.3 % (ref 36.0–46.0)
Hemoglobin: 12.8 g/dL (ref 12.0–15.0)
Immature Granulocytes: 1 %
Lymphocytes Relative: 15 %
Lymphs Abs: 1.7 10*3/uL (ref 0.7–4.0)
MCH: 32.8 pg (ref 26.0–34.0)
MCHC: 33.4 g/dL (ref 30.0–36.0)
MCV: 98.2 fL (ref 80.0–100.0)
Monocytes Absolute: 0.9 10*3/uL (ref 0.1–1.0)
Monocytes Relative: 8 %
Neutro Abs: 8.5 10*3/uL — ABNORMAL HIGH (ref 1.7–7.7)
Neutrophils Relative %: 71 %
Platelets: 185 10*3/uL (ref 150–400)
RBC: 3.9 MIL/uL (ref 3.87–5.11)
RDW: 15.6 % — ABNORMAL HIGH (ref 11.5–15.5)
WBC: 11.6 10*3/uL — ABNORMAL HIGH (ref 4.0–10.5)
nRBC: 0 % (ref 0.0–0.2)

## 2022-01-14 LAB — COMPREHENSIVE METABOLIC PANEL
ALT: 95 U/L — ABNORMAL HIGH (ref 0–44)
AST: 49 U/L — ABNORMAL HIGH (ref 15–41)
Albumin: 3.4 g/dL — ABNORMAL LOW (ref 3.5–5.0)
Alkaline Phosphatase: 174 U/L — ABNORMAL HIGH (ref 38–126)
Anion gap: 9 (ref 5–15)
BUN: 41 mg/dL — ABNORMAL HIGH (ref 8–23)
CO2: 29 mmol/L (ref 22–32)
Calcium: 8.9 mg/dL (ref 8.9–10.3)
Chloride: 92 mmol/L — ABNORMAL LOW (ref 98–111)
Creatinine, Ser: 1.1 mg/dL — ABNORMAL HIGH (ref 0.44–1.00)
GFR, Estimated: 48 mL/min — ABNORMAL LOW (ref >60)
Glucose, Bld: 115 mg/dL — ABNORMAL HIGH (ref 70–99)
Potassium: 3.5 mmol/L (ref 3.5–5.1)
Sodium: 130 mmol/L — ABNORMAL LOW (ref 135–145)
Total Bilirubin: 1.2 mg/dL (ref 0.3–1.2)
Total Protein: 6.2 g/dL — ABNORMAL LOW (ref 6.5–8.1)

## 2022-01-14 LAB — APTT
aPTT: 50 seconds — ABNORMAL HIGH (ref 24–36)
aPTT: 52 seconds — ABNORMAL HIGH (ref 24–36)
aPTT: 74 seconds — ABNORMAL HIGH (ref 24–36)

## 2022-01-14 LAB — HEPARIN LEVEL (UNFRACTIONATED): Heparin Unfractionated: >1.1 IU/mL — ABNORMAL HIGH (ref 0.30–0.70)

## 2022-01-14 LAB — MAGNESIUM: Magnesium: 2.1 mg/dL (ref 1.7–2.4)

## 2022-01-14 LAB — PHOSPHORUS: Phosphorus: 2.5 mg/dL (ref 2.5–4.6)

## 2022-01-14 MED ORDER — HEPARIN (PORCINE) 25000 UT/250ML-% IV SOLN
850.0000 [IU]/h | INTRAVENOUS | Status: DC
  Administered 2022-01-15 (×2): 850 [IU]/h via INTRAVENOUS
  Filled 2022-01-14 (×2): qty 250

## 2022-01-14 MED ORDER — HEPARIN BOLUS VIA INFUSION
1000.0000 [IU] | Freq: Once | INTRAVENOUS | Status: AC
  Administered 2022-01-14: 1000 [IU] via INTRAVENOUS
  Filled 2022-01-14: qty 1000

## 2022-01-14 NOTE — Progress Notes (Signed)
ANTICOAGULATION CONSULT NOTE - Follow Up  Pharmacy Consult for Heparin while Eliquis on hold Indication: atrial fibrillation  Allergies  Allergen Reactions   Cozaar [Losartan] Swelling    Tongue swelling   Erythromycin Itching   Azopt [Brinzolamide] Rash    Rash on eyelid    Patient Measurements: Height: 5\' 3"  (160 cm) Weight: 45.4 kg (100 lb 1.4 oz) IBW/kg (Calculated) : 52.4 Heparin Dosing Weight: n/a. Use total body weight  Vital Signs: Temp: 98 F (36.7 C) (12/03 2013) Temp Source: Oral (12/03 2013) BP: 116/69 (12/03 2013) Pulse Rate: 78 (12/03 2013)  Labs: Recent Labs    01/12/22 0541 01/13/22 0909 01/14/22 0149  HGB  --  13.2 12.8  HCT  --  40.0 38.3  PLT  --  194 185  APTT  --   --  50*  HEPARINUNFRC  --   --  >1.10*  CREATININE 1.17* 1.14* 1.10*     Estimated Creatinine Clearance: 24.8 mL/min (A) (by C-G formula based on SCr of 1.1 mg/dL (H)).   Medical History: Past Medical History:  Diagnosis Date   Arthritis    Glaucoma of both eyes    Hyperlipidemia    Hypertension    Pelvic prolapse    anterior floor   Urgency of urination    Urinary hesitancy    Urticaria     Medications:  Scheduled:   diltiazem  180 mg Oral Daily   famotidine  20 mg Oral BID   feeding supplement  237 mL Oral TID BM   furosemide  80 mg Intravenous Q12H   insulin aspart  0-6 Units Subcutaneous Q4H   leflunomide  10 mg Oral q morning   metoprolol succinate  150 mg Oral q morning   montelukast  10 mg Oral Daily   predniSONE  2.5 mg Oral Q breakfast   sodium chloride flush  3 mL Intravenous Q12H   spironolactone  12.5 mg Oral Daily   Infusions:   sodium chloride     heparin 700 Units/hr (01/14/22 0232)   PRN: sodium chloride, acetaminophen, ondansetron (ZOFRAN) IV, mouth rinse, sodium chloride flush  Assessment: 88 yo female on chronic Eliquis for afib, admitted with acute hypoxic respiratory failure secondary to volume overload. Cardiology consulted,  transitioning to IV heparin in case invasive procedures needed. Pt being worked up for 92.   -Last dose of apixaban: 12/3 @ 0907  Today, 01/14/22 aPTT = 52 seconds remains low despite increasing heparin infusion to 700 units/hr CBC: WNL, stable Confirmed with RN that heparin infusing at correct rate. No line issues/interruptions. No signs of bleeding.  SCr stable  Monitoring using aPTT at this time since HL falsely elevated due to recent DOAC.  Goal of Therapy:  PTT 66-102 seconds Heparin level 0.3-0.7 units/ml Monitor platelets by anticoagulation protocol: Yes   Plan:  Heparin bolus of 1000 units IV once Increase heparin infusion to 850 units/hr Check aPTT 8 hour after rate increase HL/aPTT, CBC daily while on heparin infusion Monitor for signs of bleeding  If heparin drip needs to be held prior to any invasive procedures, please provide guidance on when to hold heparin.  14/04/23, PharmD 01/14/22 7:28 AM

## 2022-01-14 NOTE — Progress Notes (Signed)
ANTICOAGULATION CONSULT NOTE - Follow Up  Pharmacy Consult for Heparin while Eliquis on hold Indication: atrial fibrillation  Allergies  Allergen Reactions   Cozaar [Losartan] Swelling    Tongue swelling   Erythromycin Itching   Azopt [Brinzolamide] Rash    Rash on eyelid    Patient Measurements: Height: 5\' 3"  (160 cm) Weight: 45.4 kg (100 lb 1.4 oz) IBW/kg (Calculated) : 52.4 Heparin Dosing Weight: n/a. Use total body weight  Vital Signs: Temp: 98.2 F (36.8 C) (12/04 2017) Temp Source: Oral (12/04 2017) BP: 102/52 (12/04 2017) Pulse Rate: 72 (12/04 2017)  Labs: Recent Labs    01/12/22 0541 01/13/22 0909 01/14/22 0149 01/14/22 1023 01/14/22 2104  HGB  --  13.2 12.8  --   --   HCT  --  40.0 38.3  --   --   PLT  --  194 185  --   --   APTT  --   --  50* 52* 74*  HEPARINUNFRC  --   --  >1.10*  --   --   CREATININE 1.17* 1.14* 1.10*  --   --      Estimated Creatinine Clearance: 24.8 mL/min (A) (by C-G formula based on SCr of 1.1 mg/dL (H)).   Medical History: Past Medical History:  Diagnosis Date   Arthritis    Glaucoma of both eyes    Hyperlipidemia    Hypertension    Pelvic prolapse    anterior floor   Urgency of urination    Urinary hesitancy    Urticaria     Medications:  Scheduled:   diltiazem  180 mg Oral Daily   famotidine  20 mg Oral BID   feeding supplement  237 mL Oral TID BM   furosemide  80 mg Intravenous Q12H   insulin aspart  0-6 Units Subcutaneous Q4H   leflunomide  10 mg Oral q morning   metoprolol succinate  150 mg Oral q morning   montelukast  10 mg Oral Daily   predniSONE  2.5 mg Oral Q breakfast   sodium chloride flush  3 mL Intravenous Q12H   spironolactone  12.5 mg Oral Daily   Infusions:   sodium chloride     heparin 850 Units/hr (01/14/22 2100)   PRN: sodium chloride, acetaminophen, ondansetron (ZOFRAN) IV, mouth rinse, sodium chloride flush  Assessment: 88 yo female on chronic Eliquis for afib, admitted with acute  hypoxic respiratory failure secondary to volume overload. Cardiology consulted, transitioning to IV heparin in case invasive procedures needed. Pt being worked up for potential 92.   -Last dose of apixaban: 12/3 @ 0907  Today, 01/14/22 aPTT = 74 seconds (therapeutic) with heparin infusion @ 850 units/hr No complications of therapy noted  Monitoring using aPTT at this time since HL falsely elevated due to recent DOAC.  Goal of Therapy:  PTT 66-102 seconds Heparin level 0.3-0.7 units/ml Monitor platelets by anticoagulation protocol: Yes   Plan:  Continue heparin infusion @ 850 units/hr Check aPTT in 8 hour with AM labs to confirm therapeutic dose HL/aPTT, CBC daily while on heparin infusion Monitor for signs of bleeding  If heparin drip needs to be held prior to any invasive procedures, please provide guidance on when to hold heparin.  14/04/23, PharmD 01/14/22 10:14 PM

## 2022-01-14 NOTE — H&P (View-Only) (Signed)
Rounding Note    Patient Name: Emily Robertson Date of Encounter: 01/14/2022  [facility] Cardiologist: Olga Millers, MD   Subjective   No CP or SOB.   Inpatient Medications    Scheduled Meds:  diltiazem  180 mg Oral Daily   famotidine  20 mg Oral BID   feeding supplement  237 mL Oral TID BM   furosemide  80 mg Intravenous Q12H   heparin  1,000 Units Intravenous Once   insulin aspart  0-6 Units Subcutaneous Q4H   leflunomide  10 mg Oral q morning   metoprolol succinate  150 mg Oral q morning   montelukast  10 mg Oral Daily   predniSONE  2.5 mg Oral Q breakfast   sodium chloride flush  3 mL Intravenous Q12H   spironolactone  12.5 mg Oral Daily   Continuous Infusions:  sodium chloride     heparin     PRN Meds: sodium chloride, acetaminophen, ondansetron (ZOFRAN) IV, mouth rinse, sodium chloride flush   Vital Signs    Vitals:   01/13/22 0500 01/13/22 0904 01/13/22 2013 01/14/22 0500  BP:  119/73 116/69   Pulse:  84 78   Resp:  20 19   Temp:   98 F (36.7 C)   TempSrc:   Oral   SpO2:  96% 98%   Weight: 43.2 kg   45.4 kg  Height:        Intake/Output Summary (Last 24 hours) at 01/14/2022 1140 Last data filed at 01/14/2022 1103 Gross per 24 hour  Intake 929.23 ml  Output 1370 ml  Net -440.77 ml       01/14/2022    5:00 AM 01/13/2022    5:00 AM 01/12/2022    4:51 AM  Last 3 Weights  Weight (lbs) 100 lb 1.4 oz 95 lb 3.8 oz 99 lb 13.9 oz  Weight (kg) 45.4 kg 43.2 kg 45.3 kg      Telemetry    Atrial fibrillation with CVR   - Personally Reviewed  ECG    No new  - Personally Reviewed  Physical Exam   GEN: thin elderly HEENT: Normal NECK: No JVD; No carotid bruits LYMPHATICS: No lymphadenopathy CARDIAC:irregularly irregular, no  rubs, gallops 2/6 SM at LLSB>>apex RESPIRATORY:  Clear to auscultation without rales, wheezing or rhonchi  ABDOMEN: Soft, non-tender, non-distended MUSCULOSKELETAL:  No edema; No deformity  SKIN: Warm and  dry NEUROLOGIC:  Alert and oriented x 3 PSYCHIATRIC:  Normal affect  Labs    High Sensitivity Troponin:   Recent Labs  Lab 01/10/22 1941 01/10/22 2156  TROPONINIHS 12 11      Chemistry Recent Labs  Lab 01/10/22 1941 01/11/22 0401 01/12/22 0541 01/13/22 0909 01/14/22 0149  NA 131*  --  136 135 130*  K 4.2  --  4.3 3.6 3.5  CL 95*  --  98 92* 92*  CO2 23  --  25 33* 29  GLUCOSE 158*  --  109* 105* 115*  BUN 40*  --  37* 36* 41*  CREATININE 1.28*  --  1.17* 1.14* 1.10*  CALCIUM 8.9  --  8.6* 8.9 8.9  MG  --  2.0  --  2.3 2.1  PROT 6.2*  --   --  6.2* 6.2*  ALBUMIN 3.7  --   --  3.6 3.4*  AST 114*  --   --  64* 49*  ALT 147*  --   --  119* 95*  ALKPHOS 230*  --   --  194* 174*  BILITOT 1.3*  --   --  1.2 1.2  GFRNONAA 40*  --  45* 46* 48*  ANIONGAP 13  --  13 10 9      Lipids No results for input(s): "CHOL", "TRIG", "HDL", "LABVLDL", "LDLCALC", "CHOLHDL" in the last 168 hours.  Hematology Recent Labs  Lab 01/11/22 0401 01/13/22 0909 01/14/22 0149  WBC 10.4 10.9* 11.6*  RBC 4.08 4.01 3.90  HGB 13.5 13.2 12.8  HCT 40.5 40.0 38.3  MCV 99.3 99.8 98.2  MCH 33.1 32.9 32.8  MCHC 33.3 33.0 33.4  RDW 15.9* 15.9* 15.6*  PLT 184 194 185    Thyroid  Recent Labs  Lab 01/10/22 1941 01/11/22 0111  TSH 10.519*  --   FREET4  --  1.28*     BNP Recent Labs  Lab 01/10/22 1941  BNP 1,851.3*     DDimer No results for input(s): "DDIMER" in the last 168 hours.   Radiology    No results found.  Cardiac Studies   : Transesophageal echo dated 09/02/2021, flail P2 segment, severe mitral valve regurgitation, LV function 65-70%    Patient Profile      : 88 year old female with history of hypertension, hyperlipidemia, severe mitral valve regurgitation, persistent atrial fibrillation, hyperthyroidism and rheumatoid arthritis who was admitted on 01/10/2022 for acute hypoxic respiratory failure secondary to volume overload.   Assessment & Plan    1  HFpEF    -Pt  with severe MR that is probable etiology of CHF   She presented with acute resp failure and volume overload    -currently on Lasix 80mg  IV BID -she put out 1.37L yesterday and is net neg 1.8L since admit -SCr stable at 1.10 and K_ 3.5 -she still appears volume overloaded so continue IV Lasix -follow strict I&O;s, daily weights and renal function  2  Mitral regurgitation -Pt with flail P2 segment of MV and severe MR    -Pt has seen Drs 01/12/2022 and for consideration of MitraClip.     -Son said patient would be interested but was concerned about thyroid    -I am concerned that this presentation of CHF will continue t ohappen unless intervention to valve is made      -son now wished to consider [name]>>I talked with her today and she wants to proceed with [name] -Dr. Salley Scarlet will see   3.  Permanent Atrial fibrillation   -HR controlled on tele -continue Cardizem CD 180mg  daily and Toprol XL 150mg  daily -currently on IV Heparin gtt and off DOAC awaking decision on Mitra Clip  4.  Hx hyperthyroidism   -TSH 10  and Free T4 1.28  -T3 pending -per TRH  5. Leg rash -she has a raised papular rash on her left lower leg that is not puritic and does not blanch -? Drug rash -Plt ct 185K today -per TRH  I have spent a total of 30 minutes with patient reviewing 2D echo , telemetry, EKGs, labs and examining patient as well as establishing an assessment and plan that was discussed with the patient.  > 50% of time was spent in direct patient care.    For questions or updates, please contact [facility] Please consult [email or web address] for contact info under        Signed, Arman Bogus, MD  01/14/2022, 11:40 AM

## 2022-01-14 NOTE — Progress Notes (Signed)
Daily Progress Note   Patient Name: Emily Robertson       Date: 01/14/2022 DOB: 10/18/1930  Age: 88 y.o. MRN#: [id number] Attending Physician: Merlene Laughter, DO Primary Care Physician: Rogers Blocker, FNP Admit Date: 01/10/2022  Reason for Consultation/Follow-up: Establishing goals of care  Subjective: Awake alert oriented resting in bed.  Denies chest pain denies shortness of breath.  She is aware of the possibility of her going for transcatheter mitral valve repair-MitraClip procedure.  She asks if the procedure is " dangerous".  Explained to her about her current condition and recommendation for MitraClip to the best of my ability.  Cardiology also following.  Length of Stay: 4  Current Medications: Scheduled Meds:   diltiazem  180 mg Oral Daily   famotidine  20 mg Oral BID   feeding supplement  237 mL Oral TID BM   furosemide  80 mg Intravenous Q12H   insulin aspart  0-6 Units Subcutaneous Q4H   leflunomide  10 mg Oral q morning   metoprolol succinate  150 mg Oral q morning   montelukast  10 mg Oral Daily   predniSONE  2.5 mg Oral Q breakfast   sodium chloride flush  3 mL Intravenous Q12H   spironolactone  12.5 mg Oral Daily    Continuous Infusions:  sodium chloride     heparin 850 Units/hr (01/14/22 1156)    PRN Meds: sodium chloride, acetaminophen, ondansetron (ZOFRAN) IV, mouth rinse, sodium chloride flush  Physical Exam         Weak appearing lady Resting in bed Irregularly irregular Has holosystolic murmur Regular work of breathing Alert awake oriented  Vital Signs: BP 116/69 (BP Location: Right Arm)   Pulse 78   Temp 98 F (36.7 C) (Oral)   Resp 19   Ht 5\' 3"  (1.6 m)   Wt 45.4 kg   SpO2 98%   BMI 17.73 kg/m  SpO2: SpO2: 98 % O2 Device: O2  Device: Room Air O2 Flow Rate: O2 Flow Rate (L/min): 6 L/min  Intake/output summary:  Intake/Output Summary (Last 24 hours) at 01/14/2022 1213 Last data filed at 01/14/2022 1152 Gross per 24 hour  Intake 934.95 ml  Output 1370 ml  Net -435.05 ml   LBM: Last BM Date :  (UTA) Baseline Weight: Weight: 45.3 kg Most recent  weight: Weight: 45.4 kg       Palliative Assessment/Data:      Patient Active Problem List   Diagnosis Date Noted   Permanent atrial fibrillation ([place]) 01/14/2022   Rash 01/14/2022   Acute on chronic respiratory failure with hypoxia ([place]) 01/10/2022   Mitral valve prolapse 01/10/2022   Acute CHF (congestive heart failure) ([place]) 01/10/2022   Protein-calorie malnutrition, severe 09/05/2021   Acute on chronic diastolic CHF (congestive heart failure) ([place]) 09/05/2021   Nonrheumatic mitral valve regurgitation    Acute exacerbation of CHF (congestive heart failure) ([place]) 09/03/2021   Dyspnea 09/03/2021   Hyponatremia 09/03/2021   Elevated LFTs 09/03/2021   Leukocytosis 09/03/2021   Glaucoma 09/03/2021   Lung nodule 09/03/2021   Pleural effusion 09/03/2021   Atrial fibrillation with RVR ([place])    Acute heart failure with preserved ejection fraction ([place])    Osteoporosis 07/25/2021   Rheumatoid arthritis ([place]) 07/25/2021   Pure hypercholesterolemia 07/25/2021   Paroxysmal atrial fibrillation ([place]) 07/25/2021   Fall at home, initial encounter 07/25/2021   Closed pelvic fracture ([place]) 07/25/2021   Hyperthyroidism 08/03/2013   Angioedema 11/29/2012   HTN (hypertension) 11/29/2012    Palliative Care Assessment & Plan   Patient Profile:    Assessment: 88 year old lady with hypertension dyslipidemia severe mitral regurgitation persistent atrial fibrillation, history of rheumatoid arthritis.  Patient admitted to hospital medicine service for acute hypoxic respiratory failure secondary to volume overload.  Echocardiogram showed severe mitral regurgitation that is  deemed to be the etiology of CHF.  Patient found to have flail P2 segment of mitral valve and severe mitral regurgitation.  Recommendations/Plan: Palliative medicine team consulted for CODE STATUS and goals of care discussions.  At time of initial consultation patient and his son endorsed continuation of full code full scope care.  Today, at bedside, discussed with patient about decision regarding transcatheter mitral valve repair and that the patient is being considered for MitraClip procedure.  She is in agreement to proceed.  PMT to follow along peripherally and monitor hospital course and overall disease trajectory of illness.  Goals of Care and Additional Recommendations: Limitations on Scope of Treatment: Full Scope Treatment  Code Status:    Code Status Orders  (From admission, onward)           Start     Ordered   01/11/22 0006  Full code  Continuous        01/11/22 0007           Code Status History     Date Active Date Inactive Code Status Order ID Comments User Context   09/03/2021 1649 09/06/2021 2157 Full Code [id number]  Jonnie Finner, DO ED   07/26/2021 0759 07/31/2021 1823 Full Code [id number]  Elwyn Reach, MD ED   11/29/2012 0922 12/01/2012 1707 Full Code [id number]  Collene Gobble, MD Inpatient      Advance Directive Documentation    Flowsheet Row Most Recent Value  Type of Advance Directive Healthcare Power of Attorney, Living will  Pre-existing out of facility DNR order (yellow form or pink MOST form) --  "MOST" Form in Place? --       Prognosis:  Unable to determine  Discharge Planning: To Be Determined  Care plan was discussed with  patient, IDT  Thank you for allowing the Palliative Medicine Team to assist in the care of this patient.  Mod MDM     Greater than 50%  of this time was spent counseling and coordinating  care related to the above assessment and plan.  Loistine Chance, MD  Please contact Palliative Medicine Team phone at  [phone] for questions and concerns.

## 2022-01-14 NOTE — Progress Notes (Signed)
Rounding Note    Patient Name: Emily Robertson Date of Encounter: 01/14/2022  [facility] Cardiologist: Olga Millers, MD   Subjective   No CP or SOB.   Inpatient Medications    Scheduled Meds:  diltiazem  180 mg Oral Daily   famotidine  20 mg Oral BID   feeding supplement  237 mL Oral TID BM   furosemide  80 mg Intravenous Q12H   heparin  1,000 Units Intravenous Once   insulin aspart  0-6 Units Subcutaneous Q4H   leflunomide  10 mg Oral q morning   metoprolol succinate  150 mg Oral q morning   montelukast  10 mg Oral Daily   predniSONE  2.5 mg Oral Q breakfast   sodium chloride flush  3 mL Intravenous Q12H   spironolactone  12.5 mg Oral Daily   Continuous Infusions:  sodium chloride     heparin     PRN Meds: sodium chloride, acetaminophen, ondansetron (ZOFRAN) IV, mouth rinse, sodium chloride flush   Vital Signs    Vitals:   01/13/22 0500 01/13/22 0904 01/13/22 2013 01/14/22 0500  BP:  119/73 116/69   Pulse:  84 78   Resp:  20 19   Temp:   98 F (36.7 C)   TempSrc:   Oral   SpO2:  96% 98%   Weight: 43.2 kg   45.4 kg  Height:        Intake/Output Summary (Last 24 hours) at 01/14/2022 1140 Last data filed at 01/14/2022 1103 Gross per 24 hour  Intake 929.23 ml  Output 1370 ml  Net -440.77 ml       01/14/2022    5:00 AM 01/13/2022    5:00 AM 01/12/2022    4:51 AM  Last 3 Weights  Weight (lbs) 100 lb 1.4 oz 95 lb 3.8 oz 99 lb 13.9 oz  Weight (kg) 45.4 kg 43.2 kg 45.3 kg      Telemetry    Atrial fibrillation with CVR   - Personally Reviewed  ECG    No new  - Personally Reviewed  Physical Exam   GEN: thin elderly HEENT: Normal NECK: No JVD; No carotid bruits LYMPHATICS: No lymphadenopathy CARDIAC:irregularly irregular, no  rubs, gallops 2/6 SM at LLSB>>apex RESPIRATORY:  Clear to auscultation without rales, wheezing or rhonchi  ABDOMEN: Soft, non-tender, non-distended MUSCULOSKELETAL:  No edema; No deformity  SKIN: Warm and  dry NEUROLOGIC:  Alert and oriented x 3 PSYCHIATRIC:  Normal affect  Labs    High Sensitivity Troponin:   Recent Labs  Lab 01/10/22 1941 01/10/22 2156  TROPONINIHS 12 11      Chemistry Recent Labs  Lab 01/10/22 1941 01/11/22 0401 01/12/22 0541 01/13/22 0909 01/14/22 0149  NA 131*  --  136 135 130*  K 4.2  --  4.3 3.6 3.5  CL 95*  --  98 92* 92*  CO2 23  --  25 33* 29  GLUCOSE 158*  --  109* 105* 115*  BUN 40*  --  37* 36* 41*  CREATININE 1.28*  --  1.17* 1.14* 1.10*  CALCIUM 8.9  --  8.6* 8.9 8.9  MG  --  2.0  --  2.3 2.1  PROT 6.2*  --   --  6.2* 6.2*  ALBUMIN 3.7  --   --  3.6 3.4*  AST 114*  --   --  64* 49*  ALT 147*  --   --  119* 95*  ALKPHOS 230*  --   --  194* 174*  BILITOT 1.3*  --   --  1.2 1.2  GFRNONAA 40*  --  45* 46* 48*  ANIONGAP 13  --  13 10 9      Lipids No results for input(s): "CHOL", "TRIG", "HDL", "LABVLDL", "LDLCALC", "CHOLHDL" in the last 168 hours.  Hematology Recent Labs  Lab 01/11/22 0401 01/13/22 0909 01/14/22 0149  WBC 10.4 10.9* 11.6*  RBC 4.08 4.01 3.90  HGB 13.5 13.2 12.8  HCT 40.5 40.0 38.3  MCV 99.3 99.8 98.2  MCH 33.1 32.9 32.8  MCHC 33.3 33.0 33.4  RDW 15.9* 15.9* 15.6*  PLT 184 194 185    Thyroid  Recent Labs  Lab 01/10/22 1941 01/11/22 0111  TSH 10.519*  --   FREET4  --  1.28*     BNP Recent Labs  Lab 01/10/22 1941  BNP 1,851.3*     DDimer No results for input(s): "DDIMER" in the last 168 hours.   Radiology    No results found.  Cardiac Studies   : Transesophageal echo dated 09/02/2021, flail P2 segment, severe mitral valve regurgitation, LV function 65-70%    Patient Profile      : 88 year old female with history of hypertension, hyperlipidemia, severe mitral valve regurgitation, persistent atrial fibrillation, hyperthyroidism and rheumatoid arthritis who was admitted on 01/10/2022 for acute hypoxic respiratory failure secondary to volume overload.   Assessment & Plan    1  HFpEF    -Pt  with severe MR that is probable etiology of CHF   She presented with acute resp failure and volume overload    -currently on Lasix 80mg  IV BID -she put out 1.37L yesterday and is net neg 1.8L since admit -SCr stable at 1.10 and K_ 3.5 -she still appears volume overloaded so continue IV Lasix -follow strict I&O;s, daily weights and renal function  2  Mitral regurgitation -Pt with flail P2 segment of MV and severe MR    -Pt has seen Drs 01/12/2022 and for consideration of MitraClip.     -Son said patient would be interested but was concerned about thyroid    -I am concerned that this presentation of CHF will continue t ohappen unless intervention to valve is made      -son now wished to consider [name]>>I talked with her today and she wants to proceed with [name] -Dr. Salley Scarlet will see   3.  Permanent Atrial fibrillation   -HR controlled on tele -continue Cardizem CD 180mg  daily and Toprol XL 150mg  daily -currently on IV Heparin gtt and off DOAC awaking decision on Mitra Clip  4.  Hx hyperthyroidism   -TSH 10  and Free T4 1.28  -T3 pending -per TRH  5. Leg rash -she has a raised papular rash on her left lower leg that is not puritic and does not blanch -? Drug rash -Plt ct 185K today -per TRH  I have spent a total of 30 minutes with patient reviewing 2D echo , telemetry, EKGs, labs and examining patient as well as establishing an assessment and plan that was discussed with the patient.  > 50% of time was spent in direct patient care.    For questions or updates, please contact [facility] Please consult [email or web address] for contact info under        Signed, Arman Bogus, MD  01/14/2022, 11:40 AM

## 2022-01-14 NOTE — Care Management Important Message (Signed)
Important Message  Patient Details IM Letter placed in Patient's room. Name: Emily Robertson MRN: [id number] Date of Birth: 28-Jan-1931   Medicare Important Message Given:  Yes     Caren Macadam 01/14/2022, 11:27 AM

## 2022-01-14 NOTE — Progress Notes (Signed)
ANTICOAGULATION CONSULT NOTE - follow up  Pharmacy Consult for Heparin while Eliquis on hold Indication: atrial fibrillation  Allergies  Allergen Reactions   Cozaar [Losartan] Swelling    Tongue swelling   Erythromycin Itching   Azopt [Brinzolamide] Rash    Rash on eyelid    Patient Measurements: Height: 5\' 3"  (160 cm) Weight: 43.2 kg (95 lb 3.8 oz) IBW/kg (Calculated) : 52.4 Heparin Dosing Weight: actual weight  Vital Signs: Temp: 98 F (36.7 C) (12/03 2013) Temp Source: Oral (12/03 2013) BP: 116/69 (12/03 2013) Pulse Rate: 78 (12/03 2013)  Labs: Recent Labs    01/11/22 0401 01/12/22 0541 01/13/22 0909 01/14/22 0149  HGB 13.5  --  13.2 12.8  HCT 40.5  --  40.0 38.3  PLT 184  --  194 185  APTT  --   --   --  50*  HEPARINUNFRC  --   --   --  >1.10*  CREATININE  --  1.17* 1.14* 1.10*     Estimated Creatinine Clearance: 23.6 mL/min (A) (by C-G formula based on SCr of 1.1 mg/dL (H)).   Medical History: Past Medical History:  Diagnosis Date   Arthritis    Glaucoma of both eyes    Hyperlipidemia    Hypertension    Pelvic prolapse    anterior floor   Urgency of urination    Urinary hesitancy    Urticaria     Medications:  Scheduled:   diltiazem  180 mg Oral Daily   famotidine  20 mg Oral BID   feeding supplement  237 mL Oral TID BM   furosemide  80 mg Intravenous Q12H   insulin aspart  0-6 Units Subcutaneous Q4H   leflunomide  10 mg Oral q morning   metoprolol succinate  150 mg Oral q morning   montelukast  10 mg Oral Daily   predniSONE  2.5 mg Oral Q breakfast   sodium chloride flush  3 mL Intravenous Q12H   spironolactone  12.5 mg Oral Daily   Infusions:   sodium chloride     heparin 600 Units/hr (01/14/22 0214)   PRN: sodium chloride, acetaminophen, ondansetron (ZOFRAN) IV, mouth rinse, sodium chloride flush  Assessment: 88 yo female on chronic Eliquis for afib, admitted with acute hypoxic respiratory failure secondary to volume overload.  Cardiology consulted, transitioning to IV heparin in case invasive procedures needed.  01/14/2022 aPTT 50 subtherapeutic on 600 units/hr HL > 1.1 as expected with DOAC still on board CBC WNL Per RN no bleeding and no interruptions   Goal of Therapy:  PTT 66-102 seconds Heparin level 0.3-0.7 units/ml Monitor platelets by anticoagulation protocol: Yes   Plan:  Increase heparin drip to 700 units/hr Check aPTT in 8 hours Check daily aPTT/HL, CBC Monitor closely for signs/symptoms of bleeding  14/05/2021 RPh 01/14/2022, 2:24 AM

## 2022-01-14 NOTE — Progress Notes (Signed)
PROGRESS NOTE    Emily Robertson  [id number] DOB: 08-09-1930 DOA: 01/10/2022 PCP: [name], FNP   Brief Narrative:  The patient is an 88 year old thin Caucasian female with a past medical history significant for but not limited to essential hypertension as well as dyslipidemia and other comorbidities who recently moved to independent living and was found to be confused and hypoxemic so she is brought to the ED for further evaluation. In the ED she is noted be 80% on room air needing at least 3 L of supplemental oxygen BNP was elevated. Cardiac enzymes were negative skeletal COVID is negative. He was admitted due to her hypoxemia and altered mental status and cardiology was consulted. Cardiology felt that her respiratory failure secondary to severe mitral regurgitation probably causing valvular heart disease and fluid overload. She is initiated on diuresis with IV Lasix and continues to improve. Cardiology is now talking to her about a MitraClip and she is going to think about it but her son is agreeable so the structural team will review her case tomorrow.   She currently remains on heparin while Eliquis is on hold and cardiology evaluated today and recommends continuing IV Lasix and diuretics..  Family wishes to consider MitraClip and they will proceed and so Dr. Ali Lowe is to further evaluate and see.  Palliative care spoke with the patient and they are willing to proceed with a transcatheter mitral valve repair.  Assessment and Plan:  Acute hypoxemic respiratory failure, Severe mitral regurgitation probably causing valvular heart disease and fluid overload with concomitant acute on chronic diastolic CHF/HFpEF -Currently on IV Lasix, intake output monitoring.   -Some clinical improvement today. -Echocardiogram shows severe mitral regurgitation and tricuspid regurgitation.  Ejection fraction was normal. -Chest x-ray with persistent cardiomegaly and interstitial volume.   -Patient on  Cardizem, metoprolol.   -Mitral valve regurgitation causing fluid overload.  She is currently followed at cardiology office for consideration of MitraClip procedure.   -Followed by cardiology in the hospital.   -Currently on IV diuresis with some clinical stabilization.  Cardiology recommends continuing IV diuresis again today with 80 twice daily -Patient is -1.798 L since admission -Patient is a flail P2 segment of the MV and severe MR and is seeing Dr. Arty Baumgartner only for consideration for MitraClip -Cardiology is clinically concerned that her presentation of CHF will continue to happen unless intervention to the valve was made and patient's son is amenable for her case to be presented for MitraClip now -Structural heart team to evaluate at some point  -OT recommending home health and patient ambulated 250 feet today with the mobility specialist   Paroxysmal Atrial Fibrillation -Currently in A-fib.   -Rate controlled on metoprolol and Cardizem.   -Therapeutic on Eliquis per cardiology's and transition to heparin drip in case they have done invasively for her valve.  Dr. Ali Lowe to see  -Continue to monitor on telemetry and appreciate cardiology evaluation   Acute Metabolic Encephalopathy, improved and appears baseline now -Likely secondary to #1. -CT head was normal.   -CT angiogram without evidence of pneumonia or pulmonary embolism. -Urinalysis is clear, 123456 folic acid levels are normal. -Ammonia is normal. -Mental status is clearing up. -TSH 10 0.51 but she is now on methimazole   Steroid-induced hyperglycemia -Patient had a hypoglycemic episode after admission due to poor intake.  Continue monitoring.  Encourage oral intake.  We will keep on very sensitive sliding scale. -CBGs ranging from 91-1 60   Essential hypertension -Stable on Cardizem and  metoprolol. -Continue monitor blood pressures per protocol -Last blood pressure was 117/70  Hyponatremia -The patient is sodium went  from 135 is now 130 -Continue to monitor trend and repeat CMP in the a.m.   Hypothyroidism -TSH is 10 0.519, just started On methimazole. -Free T4 was 1.28 and a little bit higher than normal and T3 was just slightly low at 68 -Will need patient follow-up with endocrinology  CKD stage IIIa -Patient's BUN/creatinine has gone from 37/1.17 -> 36/1.14 -> 41/1.10 -Avoid further nephrotoxic medications, contrast dyes, hypotension and dehydration ensure adequate renal perfusion and will need to renally adjust medications -Continue to monitor and trend and repeat CMP in a.m.  Abnormal LFTs -Improving as AST from 114 on admission is now 49 and ALT went from 147 is now 95 -Continue monitor and trend and repeat CMP in a.m. and if necessary will obtain a right upper quadrant ultrasound as well as an acute hepatitis panel  Rheumatoid Arthritis -On Arava.  Patient on maintenance prednisone 2.5 mg daily.  Leg abrasions/rash -Noted on admission and is unclear etiology but appears petechial rash -Will get wound care to evaluate   GERD/GI Prophylaxis -On Pepcid.   DVT prophylaxis: SCDs Start: 01/11/22 0006    Code Status: Full Code Family Communication: No family currently at bedside  Disposition Plan:  Level of care: Progressive Status is: Inpatient Remains inpatient appropriate because: Needs to be evaluated by the structural heart team   Consultants:  Cardiology Palliative Care Medicine   Procedures:  As delineated as above   ECHOCARDIOGRAM IMPRESSIONS     1. Prolapse of posterior MV leaflet with severe, eccentric MR.   2. Left ventricular ejection fraction, by estimation, is 60 to 65%. The  left ventricle has normal function. The left ventricle has no regional  wall motion abnormalities. Left ventricular diastolic parameters are  indeterminate.   3. Right ventricular systolic function is normal. The right ventricular  size is normal. There is mildly elevated pulmonary artery  systolic  pressure.   4. Left atrial size was severely dilated.   5. Right atrial size was moderately dilated.   6. The mitral valve is abnormal. Severe mitral valve regurgitation. No  evidence of mitral stenosis. There is moderate late systolic prolapse of  post. leaflet of the mitral valve.   7. Tricuspid valve regurgitation is moderate to severe.   8. The aortic valve is tricuspid. Aortic valve regurgitation is mild.  Aortic valve sclerosis/calcification is present, without any evidence of  aortic stenosis.   9. The inferior vena cava is normal in size with greater than 50%  respiratory variability, suggesting right atrial pressure of 3 mmHg.   FINDINGS   Left Ventricle: Left ventricular ejection fraction, by estimation, is 60  to 65%. The left ventricle has normal function. The left ventricle has no  regional wall motion abnormalities. The left ventricular internal cavity  size was normal in size. There is   no left ventricular hypertrophy. Left ventricular diastolic parameters  are indeterminate.   Right Ventricle: The right ventricular size is normal. Right ventricular  systolic function is normal. There is mildly elevated pulmonary artery  systolic pressure. The tricuspid regurgitant velocity is 2.97 m/s, and  with an assumed right atrial pressure  of 3 mmHg, the estimated right ventricular systolic pressure is 0000000 mmHg.   Left Atrium: Left atrial size was severely dilated.   Right Atrium: Right atrial size was moderately dilated.   Pericardium: There is no evidence of pericardial effusion.  Mitral Valve: The mitral valve is abnormal. There is moderate late  systolic prolapse of post. leaflet of the mitral valve. Mild mitral  annular calcification. Severe mitral valve regurgitation. No evidence of  mitral valve stenosis.   Tricuspid Valve: The tricuspid valve is normal in structure. Tricuspid  valve regurgitation is moderate to severe. No evidence of tricuspid   stenosis.   Aortic Valve: The aortic valve is tricuspid. Aortic valve regurgitation is  mild. Aortic regurgitation PHT measures 819 msec. Aortic valve  sclerosis/calcification is present, without any evidence of aortic  stenosis. Aortic valve mean gradient measures 2.0   mmHg. Aortic valve peak gradient measures 3.5 mmHg. Aortic valve area, by  VTI measures 2.24 cm.   Pulmonic Valve: The pulmonic valve was normal in structure. Pulmonic valve  regurgitation is trivial. No evidence of pulmonic stenosis.   Aorta: The aortic root is normal in size and structure.   Venous: The inferior vena cava is normal in size with greater than 50%  respiratory variability, suggesting right atrial pressure of 3 mmHg.   IAS/Shunts: No atrial level shunt detected by color flow Doppler.   Additional Comments: Prolapse of posterior MV leaflet with severe,  eccentric MR.     LEFT VENTRICLE  PLAX 2D  LVIDd:         4.00 cm  LVIDs:         2.90 cm  LV PW:         1.10 cm  LV IVS:        1.10 cm  LVOT diam:     2.00 cm  LV SV:         28  LV SV Index:   20  LVOT Area:     3.14 cm    LV Volumes (MOD)  LV vol d, MOD A2C: 57.6 ml  LV vol d, MOD A4C: 44.8 ml  LV vol s, MOD A2C: 25.5 ml  LV vol s, MOD A4C: 21.2 ml  LV SV MOD A2C:     32.1 ml  LV SV MOD A4C:     44.8 ml  LV SV MOD BP:      31.1 ml   RIGHT VENTRICLE  RV Basal diam:  4.40 cm  RV Mid diam:    3.80 cm  RV S prime:     7.18 cm/s  TAPSE (M-mode): 1.6 cm   LEFT ATRIUM              Index        RIGHT ATRIUM           Index  LA diam:        2.80 cm  1.94 cm/m   RA Area:     20.70 cm  LA Vol (A2C):   101.0 ml 69.83 ml/m  RA Volume:   56.60 ml  39.13 ml/m  LA Vol (A4C):   83.6 ml  57.80 ml/m  LA Biplane Vol: 96.5 ml  66.72 ml/m   AORTIC VALVE                    PULMONIC VALVE  AV Area (Vmax):    1.72 cm     PV Vmax:       0.56 m/s  AV Area (Vmean):   1.79 cm     PV Peak grad:  1.2 mmHg  AV Area (VTI):     2.24 cm  AV Vmax:  93.30 cm/s  AV Vmean:          57.200 cm/s  AV VTI:            0.126 m  AV Peak Grad:      3.5 mmHg  AV Mean Grad:      2.0 mmHg  LVOT Vmax:         51.00 cm/s  LVOT Vmean:        32.600 cm/s  LVOT VTI:          0.090 m  LVOT/AV VTI ratio: 0.71  AI PHT:            819 msec    AORTA  Ao Root diam: 3.30 cm  Ao Asc diam:  3.30 cm   MITRAL VALVE                  TRICUSPID VALVE  MV Area (PHT): 4.36 cm       TR Peak grad:   35.3 mmHg  MV Decel Time: 174 msec       TR Vmax:        297.00 cm/s  MR Peak grad:    75.9 mmHg  MR Mean grad:    57.0 mmHg    SHUNTS  MR Vmax:         435.50 cm/s  Systemic VTI:  0.09 m  MR Vmean:        355.0 cm/s   Systemic Diam: 2.00 cm  MR PISA:         6.28 cm  MR PISA Eff ROA: 33 mm  MR PISA Radius:  1.00 cm  MV E velocity: 93.80 cm/s  MV A velocity: 46.30 cm/s  MV E/A ratio:  2.03   Antimicrobials:  Anti-infectives (From admission, onward)    None       Subjective: Seen and examined at bedside and she is wanting to take a nap.  She denies any chest pain or shortness of breath.  Thinks she is getting better.  States that she wants to sleep.  No other concerns or complaints at this time.  Objective: Vitals:   01/13/22 0904 01/13/22 2013 01/14/22 0500 01/14/22 1217  BP: 119/73 116/69  117/70  Pulse: 84 78  82  Resp: 20 19  17   Temp:  98 F (36.7 C)  97.8 F (36.6 C)  TempSrc:  Oral  Oral  SpO2: 96% 98%  98%  Weight:   45.4 kg   Height:        Intake/Output Summary (Last 24 hours) at 01/14/2022 1808 Last data filed at 01/14/2022 1152 Gross per 24 hour  Intake 934.95 ml  Output 1370 ml  Net -435.05 ml   Filed Weights   01/12/22 0451 01/13/22 0500 01/14/22 0500  Weight: 45.3 kg 43.2 kg 45.4 kg   Examination: Physical Exam:  Constitutional: Thin chronically ill-appearing Caucasian female currently no acute distress laying in the bed but wants to sleep and is curled up Respiratory: Diminished to auscultation bilaterally  with some coarse breath sounds, no wheezing, rales, rhonchi or crackles. Normal respiratory effort and patient is not tachypenic. No accessory muscle use.  Unlabored breathing and not wearing supplemental oxygen via nasal cannula Cardiovascular: RRR, has a murmur heard.  Mild lower extremity edema Abdomen: Soft, non-tender, non-distended. Bowel sounds positive.  GU: Deferred. Musculoskeletal: No clubbing / cyanosis of digits/nails. No joint deformity upper and lower extremities.  Skin: Has a petechial rash on her right leg and lower  extremity Neurologic: CN 2-12 grossly intact with no focal deficits.  Romberg sign and cerebellar reflexes not assessed.  Psychiatric: Normal judgment and insight. Alert and oriented x 3. Normal mood and appropriate affect.   Data Reviewed: I have personally reviewed following labs and imaging studies  CBC: Recent Labs  Lab 01/10/22 1941 01/11/22 0401 01/13/22 0909 01/14/22 0149  WBC 12.0* 10.4 10.9* 11.6*  NEUTROABS 9.5* 7.8* 8.1* 8.5*  HGB 14.3 13.5 13.2 12.8  HCT 43.2 40.5 40.0 38.3  MCV 100.5* 99.3 99.8 98.2  PLT 198 184 194 [id number]   Basic Metabolic Panel: Recent Labs  Lab 01/10/22 1941 01/11/22 0401 01/12/22 0541 01/13/22 0909 01/14/22 0149  NA 131*  --  136 135 130*  K 4.2  --  4.3 3.6 3.5  CL 95*  --  98 92* 92*  CO2 23  --  25 33* 29  GLUCOSE 158*  --  109* 105* 115*  BUN 40*  --  37* 36* 41*  CREATININE 1.28*  --  1.17* 1.14* 1.10*  CALCIUM 8.9  --  8.6* 8.9 8.9  MG  --  2.0  --  2.3 2.1  PHOS  --   --   --  2.6 2.5   GFR: Estimated Creatinine Clearance: 24.8 mL/min (A) (by C-G formula based on SCr of 1.1 mg/dL (H)). Liver Function Tests: Recent Labs  Lab 01/10/22 1941 01/13/22 0909 01/14/22 0149  AST 114* 64* 49*  ALT 147* 119* 95*  ALKPHOS 230* 194* 174*  BILITOT 1.3* 1.2 1.2  PROT 6.2* 6.2* 6.2*  ALBUMIN 3.7 3.6 3.4*   No results for input(s): "LIPASE", "AMYLASE" in the last 168 hours. Recent Labs  Lab 01/11/22 0656   AMMONIA 17   Coagulation Profile: No results for input(s): "INR", "PROTIME" in the last 168 hours. Cardiac Enzymes: No results for input(s): "CKTOTAL", "CKMB", "CKMBINDEX", "TROPONINI" in the last 168 hours. BNP (last 3 results) No results for input(s): "PROBNP" in the last 8760 hours. HbA1C: No results for input(s): "HGBA1C" in the last 72 hours. CBG: Recent Labs  Lab 01/13/22 2010 01/13/22 2330 01/14/22 0737 01/14/22 1141 01/14/22 1552  GLUCAP 138* 149* 91 160* 98   Lipid Profile: No results for input(s): "CHOL", "HDL", "LDLCALC", "TRIG", "CHOLHDL", "LDLDIRECT" in the last 72 hours. Thyroid Function Tests: No results for input(s): "TSH", "T4TOTAL", "FREET4", "T3FREE", "THYROIDAB" in the last 72 hours. Anemia Panel: No results for input(s): "VITAMINB12", "FOLATE", "FERRITIN", "TIBC", "IRON", "RETICCTPCT" in the last 72 hours. Sepsis Labs: Recent Labs  Lab 01/11/22 1330 01/11/22 1745 01/12/22 0541 01/13/22 0432  PROCALCITON <0.10  --  0.10 <0.10  LATICACIDVEN 2.3* 2.0*  --   --     Recent Results (from the past 240 hour(s))  Resp Panel by RT-PCR (Flu A&B, Covid) Anterior Nasal Swab     Status: None   Collection Time: 01/10/22  7:41 PM   Specimen: Anterior Nasal Swab  Result Value Ref Range Status   SARS Coronavirus 2 by RT PCR NEGATIVE NEGATIVE Final    Comment: (NOTE) SARS-CoV-2 target nucleic acids are NOT DETECTED.  The SARS-CoV-2 RNA is generally detectable in upper respiratory specimens during the acute phase of infection. The lowest concentration of SARS-CoV-2 viral copies this assay can detect is 138 copies/mL. A negative result does not preclude SARS-Cov-2 infection and should not be used as the sole basis for treatment or other patient management decisions. A negative result may occur with  improper specimen collection/handling, submission of specimen other than nasopharyngeal  swab, presence of viral mutation(s) within the areas targeted by this assay,  and inadequate number of viral copies(<138 copies/mL). A negative result must be combined with clinical observations, patient history, and epidemiological information. The expected result is Negative.  Fact Sheet for Patients:  BloggerCourse.com  Fact Sheet for Healthcare Providers:  SeriousBroker.it  This test is no t yet approved or cleared by the [place] FDA and  has been authorized for detection and/or diagnosis of SARS-CoV-2 by FDA under an Emergency Use Authorization (EUA). This EUA will remain  in effect (meaning this test can be used) for the duration of the COVID-19 declaration under Section 564(b)(1) of the Act, 21 U.S.C.section 360bbb-3(b)(1), unless the authorization is terminated  or revoked sooner.       Influenza A by PCR NEGATIVE NEGATIVE Final   Influenza B by PCR NEGATIVE NEGATIVE Final    Comment: (NOTE) The Xpert Xpress SARS-CoV-2/FLU/RSV plus assay is intended as an aid in the diagnosis of influenza from Nasopharyngeal swab specimens and should not be used as a sole basis for treatment. Nasal washings and aspirates are unacceptable for Xpert Xpress SARS-CoV-2/FLU/RSV testing.  Fact Sheet for Patients: BloggerCourse.com  Fact Sheet for Healthcare Providers: SeriousBroker.it  This test is not yet approved or cleared by the [place] FDA and has been authorized for detection and/or diagnosis of SARS-CoV-2 by FDA under an Emergency Use Authorization (EUA). This EUA will remain in effect (meaning this test can be used) for the duration of the COVID-19 declaration under Section 564(b)(1) of the Act, 21 U.S.C. section 360bbb-3(b)(1), unless the authorization is terminated or revoked.  Performed at [facility], [address] [place], [place] [address]     Radiology Studies: No results found.  Scheduled Meds:  diltiazem  180  mg Oral Daily   famotidine  20 mg Oral BID   feeding supplement  237 mL Oral TID BM   furosemide  80 mg Intravenous Q12H   insulin aspart  0-6 Units Subcutaneous Q4H   leflunomide  10 mg Oral q morning   metoprolol succinate  150 mg Oral q morning   montelukast  10 mg Oral Daily   predniSONE  2.5 mg Oral Q breakfast   sodium chloride flush  3 mL Intravenous Q12H   spironolactone  12.5 mg Oral Daily   Continuous Infusions:  sodium chloride     heparin 850 Units/hr (01/14/22 1156)    LOS: 4 days   Marguerita Merles, DO [facility] Available via Epic secure chat 7am-7pm After these hours, please refer to coverage provider listed on [email or web address] 01/14/2022, 6:08 PM

## 2022-01-14 NOTE — Progress Notes (Signed)
Mobility Specialist - Progress Note   01/14/22 1548  Mobility  Activity Ambulated with assistance in hallway  Level of Assistance Minimal assist, patient does 75% or more  Assistive Device Other (Comment) (rollator)  Distance Ambulated (ft) 250 ft  Range of Motion/Exercises Active  Activity Response Tolerated well  Mobility Referral Yes  $Mobility charge 1 Mobility   Pt was found in bed and agreeable to ambulate. Was min-A for getting out of bed and contact guard for ambulation. Pt had no complaints and returned to recliner chair with necessities in reach and chair alarm on.   Billey Chang Mobility Specialist

## 2022-01-15 ENCOUNTER — Encounter (HOSPITAL_COMMUNITY)
Admission: EM | Disposition: A | Discharge status: Home Health | Payer: Self-pay | Source: Home / Self Care | Attending: Internal Medicine

## 2022-01-15 DIAGNOSIS — I4821 Permanent atrial fibrillation: Secondary | ICD-10-CM

## 2022-01-15 DIAGNOSIS — I340 Nonrheumatic mitral (valve) insufficiency: Secondary | ICD-10-CM | POA: Diagnosis not present

## 2022-01-15 DIAGNOSIS — M069 Rheumatoid arthritis, unspecified: Secondary | ICD-10-CM

## 2022-01-15 DIAGNOSIS — J9621 Acute and chronic respiratory failure with hypoxia: Secondary | ICD-10-CM | POA: Diagnosis not present

## 2022-01-15 DIAGNOSIS — I5031 Acute diastolic (congestive) heart failure: Secondary | ICD-10-CM | POA: Diagnosis not present

## 2022-01-15 DIAGNOSIS — I10 Essential (primary) hypertension: Secondary | ICD-10-CM | POA: Diagnosis not present

## 2022-01-15 DIAGNOSIS — R21 Rash and other nonspecific skin eruption: Secondary | ICD-10-CM

## 2022-01-15 HISTORY — PX: RIGHT/LEFT HEART CATH AND CORONARY ANGIOGRAPHY: CATH118266

## 2022-01-15 LAB — COMPREHENSIVE METABOLIC PANEL
ALT: 80 U/L — ABNORMAL HIGH (ref 0–44)
AST: 38 U/L (ref 15–41)
Albumin: 3.3 g/dL — ABNORMAL LOW (ref 3.5–5.0)
Alkaline Phosphatase: 160 U/L — ABNORMAL HIGH (ref 38–126)
Anion gap: 11 (ref 5–15)
BUN: 38 mg/dL — ABNORMAL HIGH (ref 8–23)
CO2: 27 mmol/L (ref 22–32)
Calcium: 9.1 mg/dL (ref 8.9–10.3)
Chloride: 93 mmol/L — ABNORMAL LOW (ref 98–111)
Creatinine, Ser: 1 mg/dL (ref 0.44–1.00)
GFR, Estimated: 54 mL/min — ABNORMAL LOW (ref >60)
Glucose, Bld: 109 mg/dL — ABNORMAL HIGH (ref 70–99)
Potassium: 3.2 mmol/L — ABNORMAL LOW (ref 3.5–5.1)
Sodium: 131 mmol/L — ABNORMAL LOW (ref 135–145)
Total Bilirubin: 1 mg/dL (ref 0.3–1.2)
Total Protein: 6.4 g/dL — ABNORMAL LOW (ref 6.5–8.1)

## 2022-01-15 LAB — CBC WITH DIFFERENTIAL/PLATELET
Abs Immature Granulocytes: 0.04 10*3/uL (ref 0.00–0.07)
Basophils Absolute: 0.1 10*3/uL (ref 0.0–0.1)
Basophils Relative: 1 %
Eosinophils Absolute: 0.4 10*3/uL (ref 0.0–0.5)
Eosinophils Relative: 4 %
HCT: 43.3 % (ref 36.0–46.0)
Hemoglobin: 14.5 g/dL (ref 12.0–15.0)
Immature Granulocytes: 0 %
Lymphocytes Relative: 19 %
Lymphs Abs: 2.1 10*3/uL (ref 0.7–4.0)
MCH: 32.9 pg (ref 26.0–34.0)
MCHC: 33.5 g/dL (ref 30.0–36.0)
MCV: 98.2 fL (ref 80.0–100.0)
Monocytes Absolute: 1 10*3/uL (ref 0.1–1.0)
Monocytes Relative: 9 %
Neutro Abs: 7.4 10*3/uL (ref 1.7–7.7)
Neutrophils Relative %: 67 %
Platelets: 231 10*3/uL (ref 150–400)
RBC: 4.41 MIL/uL (ref 3.87–5.11)
RDW: 15.6 % — ABNORMAL HIGH (ref 11.5–15.5)
WBC: 11.1 10*3/uL — ABNORMAL HIGH (ref 4.0–10.5)
nRBC: 0 % (ref 0.0–0.2)

## 2022-01-15 LAB — POCT I-STAT 7, (LYTES, BLD GAS, ICA,H+H)
Acid-Base Excess: 2 mmol/L (ref 0.0–2.0)
Bicarbonate: 23.3 mmol/L (ref 20.0–28.0)
Calcium, Ion: 0.91 mmol/L — ABNORMAL LOW (ref 1.15–1.40)
HCT: 45 % (ref 36.0–46.0)
Hemoglobin: 15.3 g/dL — ABNORMAL HIGH (ref 12.0–15.0)
O2 Saturation: 98 %
Potassium: 4.2 mmol/L (ref 3.5–5.1)
Sodium: 131 mmol/L — ABNORMAL LOW (ref 135–145)
TCO2: 24 mmol/L (ref 22–32)
pCO2 arterial: 28.7 mmHg — ABNORMAL LOW (ref 32–48)
pH, Arterial: 7.517 — ABNORMAL HIGH (ref 7.35–7.45)
pO2, Arterial: 96 mmHg (ref 83–108)

## 2022-01-15 LAB — POCT I-STAT EG7
Acid-Base Excess: 4 mmol/L — ABNORMAL HIGH (ref 0.0–2.0)
Acid-base deficit: 5 mmol/L — ABNORMAL HIGH (ref 0.0–2.0)
Bicarbonate: 22.3 mmol/L (ref 20.0–28.0)
Bicarbonate: 29.9 mmol/L — ABNORMAL HIGH (ref 20.0–28.0)
Calcium, Ion: 0.74 mmol/L — CL (ref 1.15–1.40)
Calcium, Ion: 0.98 mmol/L — ABNORMAL LOW (ref 1.15–1.40)
HCT: 41 % (ref 36.0–46.0)
HCT: 47 % — ABNORMAL HIGH (ref 36.0–46.0)
Hemoglobin: 13.9 g/dL (ref 12.0–15.0)
Hemoglobin: 16 g/dL — ABNORMAL HIGH (ref 12.0–15.0)
O2 Saturation: 60 %
O2 Saturation: 62 %
Potassium: 3.3 mmol/L — ABNORMAL LOW (ref 3.5–5.1)
Potassium: 4.3 mmol/L (ref 3.5–5.1)
Sodium: 117 mmol/L — CL (ref 135–145)
Sodium: 131 mmol/L — ABNORMAL LOW (ref 135–145)
TCO2: 24 mmol/L (ref 22–32)
TCO2: 31 mmol/L (ref 22–32)
pCO2, Ven: 47.7 mmHg (ref 44–60)
pCO2, Ven: 49.1 mmHg (ref 44–60)
pH, Ven: 7.277 (ref 7.25–7.43)
pH, Ven: 7.393 (ref 7.25–7.43)
pO2, Ven: 32 mmHg (ref 32–45)
pO2, Ven: 37 mmHg (ref 32–45)

## 2022-01-15 LAB — GLUCOSE, CAPILLARY
Glucose-Capillary: 107 mg/dL — ABNORMAL HIGH (ref 70–99)
Glucose-Capillary: 115 mg/dL — ABNORMAL HIGH (ref 70–99)
Glucose-Capillary: 115 mg/dL — ABNORMAL HIGH (ref 70–99)
Glucose-Capillary: 166 mg/dL — ABNORMAL HIGH (ref 70–99)
Glucose-Capillary: 260 mg/dL — ABNORMAL HIGH (ref 70–99)
Glucose-Capillary: 96 mg/dL (ref 70–99)

## 2022-01-15 LAB — HEPARIN LEVEL (UNFRACTIONATED): Heparin Unfractionated: 0.75 IU/mL — ABNORMAL HIGH (ref 0.30–0.70)

## 2022-01-15 LAB — APTT: aPTT: 86 seconds — ABNORMAL HIGH (ref 24–36)

## 2022-01-15 LAB — PHOSPHORUS: Phosphorus: 2.6 mg/dL (ref 2.5–4.6)

## 2022-01-15 LAB — MAGNESIUM: Magnesium: 2 mg/dL (ref 1.7–2.4)

## 2022-01-15 SURGERY — RIGHT/LEFT HEART CATH AND CORONARY ANGIOGRAPHY
Anesthesia: LOCAL

## 2022-01-15 MED ORDER — ENSURE ENLIVE PO LIQD: 237.0000 mL | Freq: Three times a day (TID) | ORAL | 12 refills | Status: DC

## 2022-01-15 MED ORDER — MIDAZOLAM HCL 2 MG/2ML IJ SOLN
INTRAMUSCULAR | Status: AC
  Filled 2022-01-15: qty 2

## 2022-01-15 MED ORDER — FENTANYL CITRATE (PF) 100 MCG/2ML IJ SOLN
INTRAMUSCULAR | Status: AC
  Filled 2022-01-15: qty 2

## 2022-01-15 MED ORDER — LABETALOL HCL 5 MG/ML IV SOLN: 10.0000 mg | INTRAVENOUS | Status: DC | PRN

## 2022-01-15 MED ORDER — LIDOCAINE HCL (PF) 1 % IJ SOLN
INTRAMUSCULAR | Status: AC
  Filled 2022-01-15: qty 30

## 2022-01-15 MED ORDER — HEPARIN (PORCINE) IN NACL 1000-0.9 UT/500ML-% IV SOLN
INTRAVENOUS | Status: DC | PRN
  Administered 2022-01-15 (×2): 500 mL

## 2022-01-15 MED ORDER — SODIUM CHLORIDE 0.9% FLUSH
3.0000 mL | INTRAVENOUS | Status: DC | PRN
  Administered 2022-01-16: 3 mL via INTRAVENOUS

## 2022-01-15 MED ORDER — HYDRALAZINE HCL 20 MG/ML IJ SOLN: 10.0000 mg | INTRAMUSCULAR | Status: DC | PRN

## 2022-01-15 MED ORDER — ONDANSETRON HCL 4 MG/2ML IJ SOLN: 4.0000 mg | Freq: Four times a day (QID) | INTRAMUSCULAR | Status: DC | PRN

## 2022-01-15 MED ORDER — SODIUM CHLORIDE 0.9 % IV SOLN: 250.0000 mL | INTRAVENOUS | Status: DC | PRN

## 2022-01-15 MED ORDER — MIDAZOLAM HCL 2 MG/2ML IJ SOLN
INTRAMUSCULAR | Status: DC | PRN
  Administered 2022-01-15: 1 mg via INTRAVENOUS

## 2022-01-15 MED ORDER — FENTANYL CITRATE (PF) 100 MCG/2ML IJ SOLN
INTRAMUSCULAR | Status: DC | PRN
  Administered 2022-01-15: 25 ug via INTRAVENOUS

## 2022-01-15 MED ORDER — POTASSIUM CHLORIDE CRYS ER 20 MEQ PO TBCR
40.0000 meq | EXTENDED_RELEASE_TABLET | Freq: Once | ORAL | Status: AC
  Administered 2022-01-15: 40 meq via ORAL
  Filled 2022-01-15: qty 2

## 2022-01-15 MED ORDER — FUROSEMIDE 40 MG PO TABS: 40.0000 mg | ORAL_TABLET | Freq: Two times a day (BID) | ORAL | 1 refills | Status: DC

## 2022-01-15 MED ORDER — FAMOTIDINE 20 MG PO TABS
20.0000 mg | ORAL_TABLET | Freq: Every day | ORAL | Status: DC
  Administered 2022-01-16: 20 mg via ORAL
  Filled 2022-01-15: qty 1

## 2022-01-15 MED ORDER — LIDOCAINE HCL (PF) 1 % IJ SOLN
INTRAMUSCULAR | Status: DC | PRN
  Administered 2022-01-15 (×2): 2 mL

## 2022-01-15 MED ORDER — HEPARIN SODIUM (PORCINE) 1000 UNIT/ML IJ SOLN
INTRAMUSCULAR | Status: AC
  Filled 2022-01-15: qty 10

## 2022-01-15 MED ORDER — SODIUM CHLORIDE 0.9 % IV SOLN: INTRAVENOUS | Status: DC

## 2022-01-15 MED ORDER — HEPARIN SODIUM (PORCINE) 1000 UNIT/ML IJ SOLN
INTRAMUSCULAR | Status: DC | PRN
  Administered 2022-01-15: 5000 [IU] via INTRA_ARTERIAL

## 2022-01-15 MED ORDER — ACETAMINOPHEN 325 MG PO TABS: 650.0000 mg | ORAL_TABLET | ORAL | Status: DC | PRN

## 2022-01-15 MED ORDER — SODIUM CHLORIDE 0.9% FLUSH
3.0000 mL | Freq: Two times a day (BID) | INTRAVENOUS | Status: DC
  Administered 2022-01-15 – 2022-01-16 (×2): 3 mL via INTRAVENOUS

## 2022-01-15 MED ORDER — HEPARIN (PORCINE) IN NACL 1000-0.9 UT/500ML-% IV SOLN
INTRAVENOUS | Status: AC
  Filled 2022-01-15: qty 1000

## 2022-01-15 MED ORDER — VERAPAMIL HCL 2.5 MG/ML IV SOLN
INTRAVENOUS | Status: DC | PRN
  Administered 2022-01-15: 10 mL via INTRA_ARTERIAL

## 2022-01-15 MED ORDER — SODIUM CHLORIDE 0.9% FLUSH: 3.0000 mL | INTRAVENOUS | Status: DC | PRN

## 2022-01-15 MED ORDER — SPIRONOLACTONE 25 MG PO TABS: 12.5000 mg | ORAL_TABLET | Freq: Every day | ORAL | 0 refills | Status: DC

## 2022-01-15 MED ORDER — IOHEXOL 350 MG/ML SOLN
INTRAVENOUS | Status: DC | PRN
  Administered 2022-01-15: 50 mL

## 2022-01-15 MED ORDER — VERAPAMIL HCL 2.5 MG/ML IV SOLN
INTRAVENOUS | Status: AC
  Filled 2022-01-15: qty 2

## 2022-01-15 SURGICAL SUPPLY — 17 items
CATH BALLN WEDGE 5F 110CM (CATHETERS) IMPLANT
CATH INFINITI JR4 5F (CATHETERS) IMPLANT
CATH NAVICROSS ST .035X135CM (MICROCATHETER) IMPLANT
CATH OPTITORQUE TIG 4.0 6F (CATHETERS) IMPLANT
DEVICE RAD COMP TR BAND LRG (VASCULAR PRODUCTS) IMPLANT
GLIDESHEATH SLEND SS 6F .021 (SHEATH) IMPLANT
GUIDEWIRE .025 260CM (WIRE) IMPLANT
KIT HEART LEFT (KITS) ×1 IMPLANT
PACK CARDIAC CATHETERIZATION (CUSTOM PROCEDURE TRAY) ×1 IMPLANT
SHEATH GLIDE SLENDER 4/5FR (SHEATH) IMPLANT
SHEATH PROBE COVER 6X72 (BAG) IMPLANT
TRANSDUCER W/STOPCOCK (MISCELLANEOUS) ×1 IMPLANT
TUBING CIL FLEX 10 FLL-RA (TUBING) ×1 IMPLANT
WIRE AMPLATZ SS-J .035X180CM (WIRE) IMPLANT
WIRE EMERALD 3MM-J .035X260CM (WIRE) IMPLANT
WIRE HITORQ VERSACORE ST 145CM (WIRE) IMPLANT
WIRE TORQFLEX AUST .018X40CM (WIRE) IMPLANT

## 2022-01-15 NOTE — Plan of Care (Signed)
  Problem: Education: Goal: Ability to describe self-care measures that may prevent or decrease complications (Diabetes Survival Skills Education) will improve Outcome: Progressing   Problem: Coping: Goal: Ability to adjust to condition or change in health will improve Outcome: Progressing   Problem: Safety: Goal: Ability to remain free from injury will improve Outcome: Progressing   Problem: Coping: Goal: Ability to adjust to condition or change in health will improve Outcome: Progressing

## 2022-01-15 NOTE — Progress Notes (Signed)
  Daily Progress Note   Patient Name: Emily Robertson       Date: 01/15/2022 DOB: 02-13-1930  Age: 88 y.o. MRN#: [id number] Attending Physician: Merlene Laughter, DO Primary Care Physician: Rogers Blocker, FNP Admit Date: 01/10/2022 Length of Stay: 5 days  Patient seen by PMT provider Dr. Linna Darner on 12/4. At that time, patient and son endorsed continuation for full code and full scope of care.  Patient seen by Dr. Lynnette Caffey, cardiologist, today regarding mitral valve repair. Patient and son agreeing to further workup with surgical intervention if needed. Cardiology planning for further workup at this time. PMT will continue to follow along with patient's journey at this time.   Thank you for allowing the palliative care team to participate in the care Thresea C Zillmer.  Alvester Morin, DO Palliative Care Provider PMT # [phone]

## 2022-01-15 NOTE — Discharge Summary (Signed)
Physician Discharge Summary   Patient: Emily Robertson MRN: [id number] DOB: Dec 15, 1930  Admit date:     01/10/2022  Discharge date: 01/15/22  Discharge Physician: Raiford Noble, DO   PCP: [name], FNP   Recommendations at discharge:    Follow up with PCP within 1 to 2 weeks and repeat CBC, CMP, mag, Phos within 1 week Follow-up with the structural heart team Dr. Ali Lowe within 1 week and appointment is scheduled for later this week on Friday, December 19, 2021 Follow-up with cardiothoracic surgery next week as cardiology is arranging this follow-up  Discharge Diagnoses: Principal Problem:   Acute on chronic respiratory failure with hypoxia ([place]) Active Problems:   HTN (hypertension)   Hyperthyroidism   Rheumatoid arthritis ([place])   Pure hypercholesterolemia   Paroxysmal atrial fibrillation ([facility])   Acute exacerbation of CHF (congestive heart failure) ([place])   Acute heart failure with preserved ejection fraction ([place])   Protein-calorie malnutrition, severe   Mitral valve insufficiency   Mitral valve prolapse   Acute CHF (congestive heart failure) ([place])   Permanent atrial fibrillation ([place])   Rash   Palliative care by specialist   Goals of care, counseling/discussion  Resolved Problems:   * No resolved hospital problems. [facility] Hospital Course: The patient is an 88 year old thin Caucasian female with a past medical history significant for but not limited to essential hypertension as well as dyslipidemia and other comorbidities who recently moved to independent living and was found to be confused and hypoxemic so she is brought to the ED for further evaluation. In the ED she is noted be 80% on room air needing at least 3 L of supplemental oxygen BNP was elevated. Cardiac enzymes were negative skeletal COVID is negative. He was admitted due to her hypoxemia and altered mental status and cardiology was consulted. Cardiology felt that her respiratory failure secondary to severe  mitral regurgitation probably causing valvular heart disease and fluid overload. She is initiated on diuresis with IV Lasix and continues to improve. Cardiology is now talking to her about a MitraClip and she is going to think about it but her son is agreeable so the structural team will review her case tomorrow.    She currently remains on heparin while Eliquis is on hold and cardiology evaluated today and recommends continuing IV Lasix and diuretics..  Family wishes to consider MitraClip and they will proceed and so Dr. Ali Lowe is to further evaluate and see.  Palliative care spoke with the patient and they are willing to proceed with a transcatheter mitral valve repair.  ID evaluated and recommended a coronary angiography and a right heart catheterization study as well as a cardiothoracic surgical opinion given that this represented degenerative mitral regurgitation.  She was taken for cardiac cath and cardiology felt that she was stable for discharge afterwards and transition to oral diuretics twice daily.  Cath showed a normal right dominant coronary circulation with relatively low filling pressures with low cardiac output with a mean RA pressure being around 1 mm per mercury and the right ventricular pressure of 28/27 with a right ventricular end-diastolic pressure of -1 mm per mercury.  The PA pressure was 24/9 with a mean gradient of 14 mm meters per mercury and a wedge pressure of 7 with V waves to 10 mm per mercury.  The Fick cardiac output was 2.6 L/min with a Fick cardiac index of 1.87 L/min/m consistent with low output syndrome.  Cardiology recommended further discussion with the family about possible  mitral transcatheter edge-to-edge repair and this will be done in outpatient setting.  Cardiology felt that she was stable for discharge from their perspective given her cardiac cath and she was transitioned to oral diuretics.  She will need to follow-up with her PCP and cardiology within 1 week and  has an appointment scheduled with Dr. Ali Lowe on Friday and cardiology will arrange outpatient follow-up with cardiothoracic surgery early next week.  Assessment and Plan:  Acute hypoxemic respiratory failure, Severe mitral regurgitation probably causing valvular heart disease and fluid overload with concomitant acute on chronic diastolic CHF/HFpEF -Currently on IV Lasix, intake output monitoring.   -Some clinical improvement today. -Echocardiogram shows severe mitral regurgitation and tricuspid regurgitation.  Ejection fraction was normal. -Chest x-ray with persistent cardiomegaly and interstitial volume.   -Patient on Cardizem, metoprolol.   -Mitral valve regurgitation causing fluid overload.  She is currently followed at cardiology office for consideration of MitraClip procedure.   -Followed by cardiology in the hospital.   -Currently on IV diuresis with some clinical stabilization.  Cardiology recommends continuing IV diuresis again today with 80 twice daily and transition to oral Lasix 40 mg twice daily -Patient is -1.874 L since admission -Patient is a flail P2 segment of the MV and severe MR and is seeing Dr. Arty Baumgartner only for consideration for MitraClip -Cardiology is clinically concerned that her presentation of CHF will continue to happen unless intervention to the valve was made and patient's son is amenable for her case to be presented for MitraClip now and she underwent a cath in anticipation for discussion about this on Friday. -Structural heart team to evaluate at some point  -OT recommending home health and patient ambulated 250 feet today with the mobility specialist -Underwent right heart cath and cardiology feels that she can be discharged after the cath and she is stable to be discharged to the facility with home health therapy after the cath; cardiology will arrange outpatient cardiothoracic surgery evaluation next week   Paroxysmal Atrial Fibrillation -Currently in A-fib.    -Rate controlled on metoprolol and Cardizem.   -Therapeutic on Eliquis per cardiology's and transition to heparin drip in case they have done invasively for her valve.  Dr. Ali Lowe to see and appreciate his consult and see above -Continue to monitor on telemetry and appreciate cardiology evaluation   Acute Metabolic Encephalopathy, improved and appears baseline now -Likely secondary to #1. -CT head was normal.   -CT angiogram without evidence of pneumonia or pulmonary embolism. -Urinalysis is clear, 123456 folic acid levels are normal. -Ammonia is normal. -Mental status is clearing up. -TSH 10 0.51 but she is now on methimazole -Repeat Thyroid Studies within 4-6 weeks   Steroid-induced hyperglycemia -Patient had a hypoglycemic episode after admission due to poor intake.  Continue monitoring.  Encourage oral intake.  We will keep on very sensitive sliding scale. -CBGs ranging from 91-160   Essential hypertension -Stable on Cardizem and metoprolol. -Continue monitor blood pressures per protocol -Last blood pressure was 124/81  Hypokalemia -In the setting of diuresis and potassium was 2.2 Plan will replete with p.o. KCl 40 mEQ BID x2 -Continue to Monitor an Replete as Necessary -Repeat CMP within 1-2 weeks   Hyponatremia -The patient is sodium went from 135 is now 130 yesterday and today is 131 -Continue to monitor trend and repeat CMP in the a.m.   Hypothyroidism -TSH is 10 0.519, just started On methimazole. -Free T4 was 1.28 and a little bit higher than normal and T3 was just  slightly low at 68 -Will need patient follow-up with endocrinology   CKD stage IIIa -Patient's BUN/creatinine has gone from 37/1.17 -> 36/1.14 -> 41/1.10 and is now 38/1.00 -Avoid further nephrotoxic medications, contrast dyes, hypotension and dehydration ensure adequate renal perfusion and will need to renally adjust medications -Continue to monitor and trend and repeat CMP in a.m.   Abnormal  LFTs -Improving as AST from 114 on admission is now 38 and ALT went from 147 is now 80 -Continue monitor and trend and repeat CMP within 1 week. and if necessary will obtain a right upper quadrant ultrasound as well as an acute hepatitis panel   Rheumatoid Arthritis -On Arava.  Patient on maintenance prednisone 2.5 mg daily.   Leg abrasions/rash -Noted on admission and is unclear etiology but appears petechial rash -Will get wound care to evaluate and can follow-up with PCP in the outpatient setting   GERD/GI Prophylaxis -On Pepcid.  Consultants: Cardiology, Palliative Care Medicine  Procedures performed:  ECHOCARDIOGRAM IMPRESSIONS     1. Prolapse of posterior MV leaflet with severe, eccentric MR.   2. Left ventricular ejection fraction, by estimation, is 60 to 65%. The  left ventricle has normal function. The left ventricle has no regional  wall motion abnormalities. Left ventricular diastolic parameters are  indeterminate.   3. Right ventricular systolic function is normal. The right ventricular  size is normal. There is mildly elevated pulmonary artery systolic  pressure.   4. Left atrial size was severely dilated.   5. Right atrial size was moderately dilated.   6. The mitral valve is abnormal. Severe mitral valve regurgitation. No  evidence of mitral stenosis. There is moderate late systolic prolapse of  post. leaflet of the mitral valve.   7. Tricuspid valve regurgitation is moderate to severe.   8. The aortic valve is tricuspid. Aortic valve regurgitation is mild.  Aortic valve sclerosis/calcification is present, without any evidence of  aortic stenosis.   9. The inferior vena cava is normal in size with greater than 50%  respiratory variability, suggesting right atrial pressure of 3 mmHg.   FINDINGS   Left Ventricle: Left ventricular ejection fraction, by estimation, is 60  to 65%. The left ventricle has normal function. The left ventricle has no  regional wall  motion abnormalities. The left ventricular internal cavity  size was normal in size. There is   no left ventricular hypertrophy. Left ventricular diastolic parameters  are indeterminate.   Right Ventricle: The right ventricular size is normal. Right ventricular  systolic function is normal. There is mildly elevated pulmonary artery  systolic pressure. The tricuspid regurgitant velocity is 2.97 m/s, and  with an assumed right atrial pressure  of 3 mmHg, the estimated right ventricular systolic pressure is 38.3 mmHg.   Left Atrium: Left atrial size was severely dilated.   Right Atrium: Right atrial size was moderately dilated.   Pericardium: There is no evidence of pericardial effusion.   Mitral Valve: The mitral valve is abnormal. There is moderate late  systolic prolapse of post. leaflet of the mitral valve. Mild mitral  annular calcification. Severe mitral valve regurgitation. No evidence of  mitral valve stenosis.   Tricuspid Valve: The tricuspid valve is normal in structure. Tricuspid  valve regurgitation is moderate to severe. No evidence of tricuspid  stenosis.   Aortic Valve: The aortic valve is tricuspid. Aortic valve regurgitation is  mild. Aortic regurgitation PHT measures 819 msec. Aortic valve  sclerosis/calcification is present, without any evidence of aortic  stenosis. Aortic valve mean gradient measures 2.0   mmHg. Aortic valve peak gradient measures 3.5 mmHg. Aortic valve area, by  VTI measures 2.24 cm.   Pulmonic Valve: The pulmonic valve was normal in structure. Pulmonic valve  regurgitation is trivial. No evidence of pulmonic stenosis.   Aorta: The aortic root is normal in size and structure.   Venous: The inferior vena cava is normal in size with greater than 50%  respiratory variability, suggesting right atrial pressure of 3 mmHg.   IAS/Shunts: No atrial level shunt detected by color flow Doppler.   Additional Comments: Prolapse of posterior MV leaflet  with severe,  eccentric MR.     LEFT VENTRICLE  PLAX 2D  LVIDd:         4.00 cm  LVIDs:         2.90 cm  LV PW:         1.10 cm  LV IVS:        1.10 cm  LVOT diam:     2.00 cm  LV SV:         28  LV SV Index:   20  LVOT Area:     3.14 cm    LV Volumes (MOD)  LV vol d, MOD A2C: 57.6 ml  LV vol d, MOD A4C: 44.8 ml  LV vol s, MOD A2C: 25.5 ml  LV vol s, MOD A4C: 21.2 ml  LV SV MOD A2C:     32.1 ml  LV SV MOD A4C:     44.8 ml  LV SV MOD BP:      31.1 ml   RIGHT VENTRICLE  RV Basal diam:  4.40 cm  RV Mid diam:    3.80 cm  RV S prime:     7.18 cm/s  TAPSE (M-mode): 1.6 cm   LEFT ATRIUM              Index        RIGHT ATRIUM           Index  LA diam:        2.80 cm  1.94 cm/m   RA Area:     20.70 cm  LA Vol (A2C):   101.0 ml 69.83 ml/m  RA Volume:   56.60 ml  39.13 ml/m  LA Vol (A4C):   83.6 ml  57.80 ml/m  LA Biplane Vol: 96.5 ml  66.72 ml/m   AORTIC VALVE                    PULMONIC VALVE  AV Area (Vmax):    1.72 cm     PV Vmax:       0.56 m/s  AV Area (Vmean):   1.79 cm     PV Peak grad:  1.2 mmHg  AV Area (VTI):     2.24 cm  AV Vmax:           93.30 cm/s  AV Vmean:          57.200 cm/s  AV VTI:            0.126 m  AV Peak Grad:      3.5 mmHg  AV Mean Grad:      2.0 mmHg  LVOT Vmax:         51.00 cm/s  LVOT Vmean:        32.600 cm/s  LVOT VTI:          0.090 m  LVOT/AV VTI ratio: 0.71  AI PHT:            819 msec    AORTA  Ao Root diam: 3.30 cm  Ao Asc diam:  3.30 cm   MITRAL VALVE                  TRICUSPID VALVE  MV Area (PHT): 4.36 cm       TR Peak grad:   35.3 mmHg  MV Decel Time: 174 msec       TR Vmax:        297.00 cm/s  MR Peak grad:    75.9 mmHg  MR Mean grad:    57.0 mmHg    SHUNTS  MR Vmax:         435.50 cm/s  Systemic VTI:  0.09 m  MR Vmean:        355.0 cm/s   Systemic Diam: 2.00 cm  MR PISA:         6.28 cm  MR PISA Eff ROA: 33 mm  MR PISA Radius:  1.00 cm  MV E velocity: 93.80 cm/s  MV A velocity: 46.30 cm/s  MV E/A ratio:   2.03   CARDIAC CATH  1.  Normal right dominant coronary circulation. 2.  Relatively low filling pressures with low cardiac output.  The mean RA pressure was around 1 mmHg, RV pressure 20/27 with an RV end-diastolic pressure of -1 mmHg, PA pressure 24/9 with a mean of 14 mmHg, wedge pressure of 7 with V waves to 10 mmHg.  The Fick cardiac output was 2.6 L/min with a Fick cardiac index of 1.87 L/min/m consistent with low output syndrome.   Recommendation: Further discussion with family regarding possible mitral transcatheter edge-to-edge repair.   Disposition:  ALF with PT/OT Diet recommendation:  Discharge Diet Orders (From admission, onward)     Start     Ordered   01/15/22 0000  Diet - low sodium heart healthy        01/15/22 1209           Cardiac diet DISCHARGE MEDICATION: Allergies as of 01/15/2022       Reactions   Cozaar [losartan] Swelling   Tongue swelling   Erythromycin Itching   Azopt [brinzolamide] Rash   Rash on eyelid        Medication List     TAKE these medications    alendronate 70 MG tablet Commonly known as: FOSAMAX Take 70 mg by mouth every Wednesday.   apixaban 2.5 MG Tabs tablet Commonly known as: ELIQUIS Take 1 tablet (2.5 mg total) by mouth 2 (two) times daily.   atorvastatin 20 MG tablet Commonly known as: LIPITOR Take 20 mg by mouth daily.   CALCIUM PO Take 1 tablet by mouth daily with supper.   cetirizine 10 MG tablet Commonly known as: ZYRTEC Take 1 tablet (10 mg total) by mouth 2 (two) times daily.   diltiazem 180 MG 24 hr capsule Commonly known as: CARDIZEM CD Take 1 capsule (180 mg total) by mouth daily.   EPINEPHrine 0.3 mg/0.3 mL Soaj injection Commonly known as: EpiPen Inject 0.3 mg into the muscle once as needed for anaphylaxis.   famotidine 20 MG tablet Commonly known as: PEPCID Take 1 tablet (20 mg total) by mouth 2 (two) times daily.   feeding supplement Liqd Take 237 mLs by mouth 3 (three) times daily  between meals. What changed: when to take this   Fish Oil 1000 MG Caps  Take 1,000 mg by mouth every morning.   furosemide 40 MG tablet Commonly known as: LASIX Take 1 tablet (40 mg total) by mouth 2 (two) times daily. What changed: when to take this   latanoprost 0.005 % ophthalmic solution Commonly known as: XALATAN Place 1 drop into the left eye at bedtime.   leflunomide 10 MG tablet Commonly known as: ARAVA Take 10 mg by mouth every morning.   methimazole 10 MG tablet Commonly known as: TAPAZOLE Take 1 tablet (10 mg total) by mouth 2 (two) times daily. What changed:  how much to take when to take this   metoprolol succinate 100 MG 24 hr tablet Commonly known as: TOPROL-XL Take 1.5 tablets (150 mg total) by mouth every morning.   montelukast 10 MG tablet Commonly known as: SINGULAIR Take 1 tablet (10 mg total) by mouth daily. What changed: when to take this   multivitamin with minerals Tabs tablet Take 1 tablet by mouth every morning.   predniSONE 2.5 MG tablet Commonly known as: DELTASONE Take 1 tablet (2.5 mg total) by mouth daily with breakfast.   PROBIOTIC DAILY PO Take 1 capsule by mouth daily with supper.   spironolactone 25 MG tablet Commonly known as: ALDACTONE Take 0.5 tablets (12.5 mg total) by mouth daily. Start taking on: January 16, 2022   timolol 0.5 % ophthalmic solution Commonly known as: TIMOPTIC Place 1 drop into both eyes 2 (two) times daily.   VITAMIN C PO Take 1 tablet by mouth every morning.   VITAMIN D3 PO Take 1,000 Units by mouth daily with supper.       Discharge Exam: Filed Weights   01/13/22 0500 01/14/22 0500 01/15/22 0431  Weight: 43.2 kg 45.4 kg 43.5 kg   Vitals:   01/15/22 0954 01/15/22 1200  BP: 117/80 123/77  Pulse: 81 (!) 104  Resp:  20  Temp:    SpO2:  91%   Examination: Physical Exam:  Constitutional: Thin frail cachectic chronically ill-appearing Caucasian elderly female in no acute  distress Respiratory: Diminished to auscultation bilaterally, no wheezing, rales, rhonchi or crackles. Normal respiratory effort and patient is not tachypenic. No accessory muscle use.  Unlabored breathing and not wearing supplemental oxygen via nasal cannula Cardiovascular: RRR, no murmurs / rubs / gallops. S1 and S2 auscultated. No extremity edema.  Abdomen: Soft, non-tender, non-distended. Bowel sounds positive.  GU: Deferred. Musculoskeletal: No clubbing / cyanosis of digits/nails. No joint deformity upper and lower extremities.  Skin: Has a mild petechial rash on the right leg that is stable from yesterday Neurologic: CN 2-12 grossly intact with no focal deficits. Romberg sign and cerebellar reflexes not assessed.  Psychiatric: Normal judgment and insight. Alert and oriented x 3. Normal mood and appropriate affect.   Condition at discharge: stable  The results of significant diagnostics from this hospitalization (including imaging, microbiology, ancillary and laboratory) are listed below for reference.   Imaging Studies: DG CHEST PORT 1 VIEW  Result Date: 01/11/2022 CLINICAL DATA:  Hypoxemia EXAM: PORTABLE CHEST 1 VIEW COMPARISON:  Chest x-ray 01/10/2022.  CT of the chest 11 20. FINDINGS: Small pleural effusions persist. The heart is enlarged. There central pulmonary vascular congestion. There is no lung consolidation or pneumothorax. No acute fractures are seen. IMPRESSION: Cardiomegaly with central pulmonary vascular congestion and small bilateral pleural effusions. Electronically Signed   By: Ronney Asters M.D.   On: 01/11/2022 18:51   ECHOCARDIOGRAM COMPLETE  Result Date: 01/11/2022    ECHOCARDIOGRAM REPORT   Patient Name:  Margaretha Glassing Date of Exam: 01/11/2022 Medical Rec #:  [id number]         Height:       63.0 in Accession #:    [id number]        Weight:       101.0 lb Date of Birth:  11-03-1930         BSA:          1.446 m Patient Age:    28 years          BP:           107/95  mmHg Patient Gender: F                 HR:           88 bpm. Exam Location:  Inpatient Procedure: 2D Echo Indications:    CHF  History:        Patient has prior history of Echocardiogram examinations, most                 recent 07/26/2021. CHF, Mitral Valve Prolapse, Arrythmias:Atrial                 Fibrillation; Risk Factors:Hypertension.  Sonographer:    Harvie Junior Referring Phys: [id number] [facility]  Sonographer Comments: Restraints. IMPRESSIONS  1. Prolapse of posterior MV leaflet with severe, eccentric MR.  2. Left ventricular ejection fraction, by estimation, is 60 to 65%. The left ventricle has normal function. The left ventricle has no regional wall motion abnormalities. Left ventricular diastolic parameters are indeterminate.  3. Right ventricular systolic function is normal. The right ventricular size is normal. There is mildly elevated pulmonary artery systolic pressure.  4. Left atrial size was severely dilated.  5. Right atrial size was moderately dilated.  6. The mitral valve is abnormal. Severe mitral valve regurgitation. No evidence of mitral stenosis. There is moderate late systolic prolapse of post. leaflet of the mitral valve.  7. Tricuspid valve regurgitation is moderate to severe.  8. The aortic valve is tricuspid. Aortic valve regurgitation is mild. Aortic valve sclerosis/calcification is present, without any evidence of aortic stenosis.  9. The inferior vena cava is normal in size with greater than 50% respiratory variability, suggesting right atrial pressure of 3 mmHg. FINDINGS  Left Ventricle: Left ventricular ejection fraction, by estimation, is 60 to 65%. The left ventricle has normal function. The left ventricle has no regional wall motion abnormalities. The left ventricular internal cavity size was normal in size. There is  no left ventricular hypertrophy. Left ventricular diastolic parameters are indeterminate. Right Ventricle: The right ventricular size is normal. Right  ventricular systolic function is normal. There is mildly elevated pulmonary artery systolic pressure. The tricuspid regurgitant velocity is 2.97 m/s, and with an assumed right atrial pressure of 3 mmHg, the estimated right ventricular systolic pressure is 0000000 mmHg. Left Atrium: Left atrial size was severely dilated. Right Atrium: Right atrial size was moderately dilated. Pericardium: There is no evidence of pericardial effusion. Mitral Valve: The mitral valve is abnormal. There is moderate late systolic prolapse of post. leaflet of the mitral valve. Mild mitral annular calcification. Severe mitral valve regurgitation. No evidence of mitral valve stenosis. Tricuspid Valve: The tricuspid valve is normal in structure. Tricuspid valve regurgitation is moderate to severe. No evidence of tricuspid stenosis. Aortic Valve: The aortic valve is tricuspid. Aortic valve regurgitation is mild. Aortic regurgitation PHT measures 819 msec. Aortic valve sclerosis/calcification is present, without any evidence of aortic  stenosis. Aortic valve mean gradient measures 2.0  mmHg. Aortic valve peak gradient measures 3.5 mmHg. Aortic valve area, by VTI measures 2.24 cm. Pulmonic Valve: The pulmonic valve was normal in structure. Pulmonic valve regurgitation is trivial. No evidence of pulmonic stenosis. Aorta: The aortic root is normal in size and structure. Venous: The inferior vena cava is normal in size with greater than 50% respiratory variability, suggesting right atrial pressure of 3 mmHg. IAS/Shunts: No atrial level shunt detected by color flow Doppler. Additional Comments: Prolapse of posterior MV leaflet with severe, eccentric MR.  LEFT VENTRICLE PLAX 2D LVIDd:         4.00 cm LVIDs:         2.90 cm LV PW:         1.10 cm LV IVS:        1.10 cm LVOT diam:     2.00 cm LV SV:         28 LV SV Index:   20 LVOT Area:     3.14 cm  LV Volumes (MOD) LV vol d, MOD A2C: 57.6 ml LV vol d, MOD A4C: 44.8 ml LV vol s, MOD A2C: 25.5 ml LV vol  s, MOD A4C: 21.2 ml LV SV MOD A2C:     32.1 ml LV SV MOD A4C:     44.8 ml LV SV MOD BP:      31.1 ml RIGHT VENTRICLE RV Basal diam:  4.40 cm RV Mid diam:    3.80 cm RV S prime:     7.18 cm/s TAPSE (M-mode): 1.6 cm LEFT ATRIUM              Index        RIGHT ATRIUM           Index LA diam:        2.80 cm  1.94 cm/m   RA Area:     20.70 cm LA Vol (A2C):   101.0 ml 69.83 ml/m  RA Volume:   56.60 ml  39.13 ml/m LA Vol (A4C):   83.6 ml  57.80 ml/m LA Biplane Vol: 96.5 ml  66.72 ml/m  AORTIC VALVE                    PULMONIC VALVE AV Area (Vmax):    1.72 cm     PV Vmax:       0.56 m/s AV Area (Vmean):   1.79 cm     PV Peak grad:  1.2 mmHg AV Area (VTI):     2.24 cm AV Vmax:           93.30 cm/s AV Vmean:          57.200 cm/s AV VTI:            0.126 m AV Peak Grad:      3.5 mmHg AV Mean Grad:      2.0 mmHg LVOT Vmax:         51.00 cm/s LVOT Vmean:        32.600 cm/s LVOT VTI:          0.090 m LVOT/AV VTI ratio: 0.71 AI PHT:            819 msec  AORTA Ao Root diam: 3.30 cm Ao Asc diam:  3.30 cm MITRAL VALVE                  TRICUSPID VALVE MV Area (PHT): 4.36 cm  TR Peak grad:   35.3 mmHg MV Decel Time: 174 msec       TR Vmax:        297.00 cm/s MR Peak grad:    75.9 mmHg MR Mean grad:    57.0 mmHg    SHUNTS MR Vmax:         435.50 cm/s  Systemic VTI:  0.09 m MR Vmean:        355.0 cm/s   Systemic Diam: 2.00 cm MR PISA:         6.28 cm MR PISA Eff ROA: 33 mm MR PISA Radius:  1.00 cm MV E velocity: 93.80 cm/s MV A velocity: 46.30 cm/s MV E/A ratio:  2.03 Kirk Ruths MD Electronically signed by Kirk Ruths MD Signature Date/Time: 01/11/2022/4:23:51 PM    Final    CT Chest W Contrast  Result Date: 01/10/2022 CLINICAL DATA:  Altered level of consciousness, fatigue, short of breath, hypoxia EXAM: CT CHEST WITH CONTRAST TECHNIQUE: Multidetector CT imaging of the chest was performed during intravenous contrast administration. RADIATION DOSE REDUCTION: This exam was performed according to the  departmental dose-optimization program which includes automated exposure control, adjustment of the mA and/or kV according to patient size and/or use of iterative reconstruction technique. CONTRAST:  70mL OMNIPAQUE IOHEXOL 300 MG/ML  SOLN COMPARISON:  01/10/2022, 09/03/2021, 01/12/2021 FINDINGS: Cardiovascular: The heart is enlarged with prominent biatrial dilatation. No pericardial effusion. No pulmonary emboli. No evidence of thoracic aortic aneurysm or dissection. Atherosclerosis of the aorta. Mediastinum/Nodes: No enlarged mediastinal, hilar, or axillary lymph nodes. Thyroid gland, trachea, and esophagus demonstrate no significant findings. Lungs/Pleura: There are persistent bilateral pleural effusions less than 1 L each. Ground-glass nodularity at the right apex measuring up to 1.5 cm is unchanged since prior exam, reference image 17/6. No new airspace disease. No pneumothorax. Stable background emphysema. Central airways are patent. Upper Abdomen: Reflux of contrast into the hepatic veins suggesting cardiac dysfunction. No acute upper abdominal findings. Musculoskeletal: No acute or destructive bony lesions. Reconstructed images demonstrate no additional findings. IMPRESSION: 1. Stable bilateral pleural effusions. 2. Stable ground-glass nodularity at the right apex, unchanged since 01/12/2021. Follow-up CT is recommended every 2 years until 5 years of stability has been established. This recommendation follows the consensus statement: Guidelines for Management of Incidental Pulmonary Nodules Detected on CT Images: From the Fleischner Society 2017; Radiology 2017; 284:228-243. 3. No evidence of pulmonary embolus. 4. Cardiomegaly with prominent biatrial dilatation. 5.  Aortic Atherosclerosis (ICD10-I70.0). Electronically Signed   By: Randa Ngo M.D.   On: 01/10/2022 22:24   CT HEAD WO CONTRAST (5MM)  Result Date: 01/10/2022 CLINICAL DATA:  Mental status change, unknown cause EXAM: CT HEAD WITHOUT  CONTRAST TECHNIQUE: Contiguous axial images were obtained from the base of the skull through the vertex without intravenous contrast. RADIATION DOSE REDUCTION: This exam was performed according to the departmental dose-optimization program which includes automated exposure control, adjustment of the mA and/or kV according to patient size and/or use of iterative reconstruction technique. COMPARISON:  07/25/2021 FINDINGS: Brain: There is periventricular white matter decreased attenuation consistent with small vessel ischemic changes. Ventricles, sulci and cisterns are prominent consistent with age related involutional changes. No acute intracranial hemorrhage, mass effect or shift. No hydrocephalus. Encephalomalacia identified consistent with chronic bilateral basal ganglia lacunar CVAs. Vascular: No hyperdense vessel or unexpected calcification. Skull: Normal. Negative for fracture or focal lesion. Sinuses/Orbits: No acute finding. IMPRESSION: Atrophy and chronic small vessel ischemic changes. Chronic lacunar CVAs bilateral basal ganglia. No acute  intracranial process identified. Electronically Signed   By: Sammie Bench M.D.   On: 01/10/2022 21:42   DG Chest Port 1 View  Result Date: 01/10/2022 CLINICAL DATA:  Short of breath EXAM: PORTABLE CHEST 1 VIEW COMPARISON:  09/03/2021 FINDINGS: Single frontal view of the chest demonstrates persistent enlargement of the cardiac silhouette. No airspace disease, effusion, or pneumothorax. Chronic central vascular prominence. Stable nodular density at the right apex, please see previous CT discussion. No acute bony abnormalities. IMPRESSION: 1. Chronic central vascular congestion.  No acute airspace disease. 2. Stable nodularity at the right apex, please see previous CT chest discussion. Electronically Signed   By: Randa Ngo M.D.   On: 01/10/2022 20:20    Microbiology: Results for orders placed or performed during the hospital encounter of 01/10/22  Resp Panel  by RT-PCR (Flu A&B, Covid) Anterior Nasal Swab     Status: None   Collection Time: 01/10/22  7:41 PM   Specimen: Anterior Nasal Swab  Result Value Ref Range Status   SARS Coronavirus 2 by RT PCR NEGATIVE NEGATIVE Final    Comment: (NOTE) SARS-CoV-2 target nucleic acids are NOT DETECTED.  The SARS-CoV-2 RNA is generally detectable in upper respiratory specimens during the acute phase of infection. The lowest concentration of SARS-CoV-2 viral copies this assay can detect is 138 copies/mL. A negative result does not preclude SARS-Cov-2 infection and should not be used as the sole basis for treatment or other patient management decisions. A negative result may occur with  improper specimen collection/handling, submission of specimen other than nasopharyngeal swab, presence of viral mutation(s) within the areas targeted by this assay, and inadequate number of viral copies(<138 copies/mL). A negative result must be combined with clinical observations, patient history, and epidemiological information. The expected result is Negative.  Fact Sheet for Patients:  EntrepreneurPulse.com.au  Fact Sheet for Healthcare Providers:  IncredibleEmployment.be  This test is no t yet approved or cleared by the [place] FDA and  has been authorized for detection and/or diagnosis of SARS-CoV-2 by FDA under an Emergency Use Authorization (EUA). This EUA will remain  in effect (meaning this test can be used) for the duration of the COVID-19 declaration under Section 564(b)(1) of the Act, 21 U.S.C.section 360bbb-3(b)(1), unless the authorization is terminated  or revoked sooner.       Influenza A by PCR NEGATIVE NEGATIVE Final   Influenza B by PCR NEGATIVE NEGATIVE Final    Comment: (NOTE) The Xpert Xpress SARS-CoV-2/FLU/RSV plus assay is intended as an aid in the diagnosis of influenza from Nasopharyngeal swab specimens and should not be used as a sole basis  for treatment. Nasal washings and aspirates are unacceptable for Xpert Xpress SARS-CoV-2/FLU/RSV testing.  Fact Sheet for Patients: EntrepreneurPulse.com.au  Fact Sheet for Healthcare Providers: IncredibleEmployment.be  This test is not yet approved or cleared by the [place] FDA and has been authorized for detection and/or diagnosis of SARS-CoV-2 by FDA under an Emergency Use Authorization (EUA). This EUA will remain in effect (meaning this test can be used) for the duration of the COVID-19 declaration under Section 564(b)(1) of the Act, 21 U.S.C. section 360bbb-3(b)(1), unless the authorization is terminated or revoked.  Performed at [facility], [place] [address] [place], [place] [address]     Labs: CBC: Recent Labs  Lab 01/10/22 1941 01/11/22 0401 01/13/22 0909 01/14/22 0149 01/15/22 0442  WBC 12.0* 10.4 10.9* 11.6* 11.1*  NEUTROABS 9.5* 7.8* 8.1* 8.5* 7.4  HGB 14.3 13.5 13.2 12.8 14.5  HCT 43.2 40.5 40.0 38.3 43.3  MCV 100.5* 99.3 99.8 98.2 98.2  PLT 198 184 194 185 [id number]   Basic Metabolic Panel: Recent Labs  Lab 01/10/22 1941 01/11/22 0401 01/12/22 0541 01/13/22 0909 01/14/22 0149 01/15/22 0442  NA 131*  --  136 135 130* 131*  K 4.2  --  4.3 3.6 3.5 3.2*  CL 95*  --  98 92* 92* 93*  CO2 23  --  25 33* 29 27  GLUCOSE 158*  --  109* 105* 115* 109*  BUN 40*  --  37* 36* 41* 38*  CREATININE 1.28*  --  1.17* 1.14* 1.10* 1.00  CALCIUM 8.9  --  8.6* 8.9 8.9 9.1  MG  --  2.0  --  2.3 2.1 2.0  PHOS  --   --   --  2.6 2.5 2.6   Liver Function Tests: Recent Labs  Lab 01/10/22 1941 01/13/22 0909 01/14/22 0149 01/15/22 0442  AST 114* 64* 49* 38  ALT 147* 119* 95* 80*  ALKPHOS 230* 194* 174* 160*  BILITOT 1.3* 1.2 1.2 1.0  PROT 6.2* 6.2* 6.2* 6.4*  ALBUMIN 3.7 3.6 3.4* 3.3*   CBG: Recent Labs  Lab 01/14/22 2028 01/15/22 0048 01/15/22 0430 01/15/22 0717 01/15/22 1146  GLUCAP 174* 96 115*  107* 115*   Discharge time spent: greater than 30 minutes.  Signed: Raiford Noble, DO [facility] 01/15/2022

## 2022-01-15 NOTE — TOC Progression Note (Signed)
Transition of Care [facility]) - Progression Note    Patient Details  Name: Emily Robertson MRN: [id number] Date of Birth: November 20, 1930  [facility]) CM/SW Contact  Anna-Marie Coller, Olegario Messier, RN Phone Number: 01/15/2022, 3:28 PM  Clinical Narrative:  Finally spoke to Lajoyce Corners CSW @ [facility] accepted for ALF rm#[id number],report tel#[phone]. PTAR to be called once back from cath lab-Nsg aware.     Expected Discharge Plan: Assisted Living Barriers to Discharge: Continued Medical Work up  Expected Discharge Plan and Services Expected Discharge Plan: Assisted Living   Discharge Planning Services: CM Consult Post Acute Care Choice: Home Health Living arrangements for the past 2 months: Independent Living Facility Expected Discharge Date: 01/15/22                           [facility] Agency: [facility] Date HH Agency Contacted: 01/15/22 Time HH Agency Contacted: 1049 Representative spoke with at [facility] Agency: Misty Stanley covering for [employer] of Health (SDOH) Interventions    Readmission Risk Interventions    01/15/2022   11:01 AM  Readmission Risk Prevention Plan  Transportation Screening Complete  PCP or Specialist Appt within 3-5 Days Complete  HRI or Home Care Consult Complete  Social Work Consult for Recovery Care Planning/Counseling Complete  Palliative Care Screening Complete  Medication Review Oceanographer) Complete

## 2022-01-15 NOTE — [place] FL2 (Signed)
[place] MEDICAID FL2 LEVEL OF CARE FORM     IDENTIFICATION  Patient Name: Emily Robertson Birthdate: [birth year] Sex: female Admission Date (Current Location): 01/10/2022  [facility] and [place] Number:  Producer, television/film/video and Address:  [facility],  [address] [place], [place] [address]      Provider Number: [id number]  Attending Physician Name and Address:  Merlene Laughter, DO  Relative Name and Phone Number:   [name] Peets(Son)tel#[phone])    Current Level of Care: Hospital Recommended Level of Care: Skilled Nursing Facility Prior Approval Number:    Date Approved/Denied:   PASRR Number:  [id number])  Discharge Plan: Other (Comment) (ALF)    Current Diagnoses: Patient Active Problem List   Diagnosis Date Noted   Permanent atrial fibrillation (HCC) 01/14/2022   Rash 01/14/2022   Palliative care by specialist 01/14/2022   Goals of care, counseling/discussion 01/14/2022   Acute on chronic respiratory failure with hypoxia (HCC) 01/10/2022   Mitral valve prolapse 01/10/2022   Acute CHF (congestive heart failure) (HCC) 01/10/2022   Protein-calorie malnutrition, severe 09/05/2021   Acute on chronic diastolic CHF (congestive heart failure) (HCC) 09/05/2021   Mitral valve insufficiency    Acute exacerbation of CHF (congestive heart failure) (HCC) 09/03/2021   Dyspnea 09/03/2021   Hyponatremia 09/03/2021   Elevated LFTs 09/03/2021   Leukocytosis 09/03/2021   Glaucoma 09/03/2021   Lung nodule 09/03/2021   Pleural effusion 09/03/2021   Atrial fibrillation with RVR (HCC)    Acute heart failure with preserved ejection fraction (HCC)    Osteoporosis 07/25/2021   Rheumatoid arthritis (HCC) 07/25/2021   Pure hypercholesterolemia 07/25/2021   Paroxysmal atrial fibrillation (HCC) 07/25/2021   Fall at home, initial encounter 07/25/2021   Closed pelvic fracture (HCC) 07/25/2021   Hyperthyroidism 08/03/2013   Angioedema 11/29/2012   HTN  (hypertension) 11/29/2012    Orientation RESPIRATION BLADDER Height & Weight     Self, Time, Situation, Place  Normal Continent Weight: 43.5 kg Height:  5\' 3"  (160 cm)  BEHAVIORAL SYMPTOMS/MOOD NEUROLOGICAL BOWEL NUTRITION STATUS      Continent Diet (Regular)  AMBULATORY STATUS COMMUNICATION OF NEEDS Skin   Limited Assist Verbally Normal                       Personal Care Assistance Level of Assistance  Bathing, Feeding, Dressing Bathing Assistance: Limited assistance Feeding assistance: Independent Dressing Assistance: Limited assistance     Functional Limitations Info  Sight, Hearing, Speech Sight Info: Impaired (eyeglasses) Hearing Info: Adequate Speech Info: Adequate    SPECIAL CARE FACTORS FREQUENCY  PT (By licensed PT), OT (By licensed OT)     PT Frequency:  (2x week) OT Frequency:  (2x week)            Contractures Contractures Info: Not present    Additional Factors Info  Code Status, Allergies Code Status Info:  (Full) Allergies Info:  (Cozaar (Losartan), Erythromycin, Azopt (Brinzolamide))           Current Medications (01/15/2022):  This is the current hospital active medication list Current Facility-Administered Medications  Medication Dose Route Frequency Provider Last Rate Last Admin   0.9 %  sodium chloride infusion  250 mL Intravenous PRN Crosley, Debby, MD       0.9 %  sodium chloride infusion   Intravenous Continuous End, 14/06/2021, MD       acetaminophen (TYLENOL) tablet 650 mg  650 mg Oral Q4H PRN Cristal Deer, MD  diltiazem (CARDIZEM CD) 24 hr capsule 180 mg  180 mg Oral Daily Crosley, Debby, MD   180 mg at 01/15/22 0954   famotidine (PEPCID) tablet 20 mg  20 mg Oral BID Crosley, Debby, MD   20 mg at 01/15/22 0953   feeding supplement (ENSURE ENLIVE / ENSURE PLUS) liquid 237 mL  237 mL Oral TID BM Crosley, Debby, MD   237 mL at 01/14/22 1349   furosemide (LASIX) injection 80 mg  80 mg Intravenous Q12H Azalee Course, PA   80 mg  at 01/14/22 2055   heparin ADULT infusion 100 units/mL (25000 units/236mL)  850 Units/hr Intravenous Continuous Cindi Carbon, RPH 8.5 mL/hr at 01/15/22 0223 850 Units/hr at 01/15/22 0223   insulin aspart (novoLOG) injection 0-6 Units  0-6 Units Subcutaneous Q4H Dorcas Carrow, MD   1 Units at 01/14/22 2053   leflunomide (ARAVA) tablet 10 mg  10 mg Oral q morning Crosley, Debby, MD   10 mg at 01/15/22 0955   metoprolol succinate (TOPROL-XL) 24 hr tablet 150 mg  150 mg Oral q morning Crosley, Debby, MD   150 mg at 01/15/22 0954   montelukast (SINGULAIR) tablet 10 mg  10 mg Oral Daily Crosley, Debby, MD   10 mg at 01/15/22 0953   ondansetron (ZOFRAN) injection 4 mg  4 mg Intravenous Q6H PRN Gery Pray, MD       Oral care mouth rinse  15 mL Mouth Rinse PRN Dorcas Carrow, MD       predniSONE (DELTASONE) tablet 2.5 mg  2.5 mg Oral Q breakfast Crosley, Debby, MD   2.5 mg at 01/15/22 0953   sodium chloride flush (NS) 0.9 % injection 3 mL  3 mL Intravenous Q12H Crosley, Debby, MD   3 mL at 01/14/22 2101   sodium chloride flush (NS) 0.9 % injection 3 mL  3 mL Intravenous PRN Crosley, Debby, MD       spironolactone (ALDACTONE) tablet 12.5 mg  12.5 mg Oral Daily O'Neal, Ronnald Ramp, MD   12.5 mg at 01/14/22 5573     Discharge Medications: Please see discharge summary for a list of discharge medications.  Relevant Imaging Results:  Relevant Lab Results:   Additional Information  [phone])  Arsalan Brisbin, Olegario Messier, RN

## 2022-01-15 NOTE — Progress Notes (Signed)
Pharmacy Brief Note - Renal Dose Adjustment  Pt taking famotidine 20 mg PO BID PTA over the counter. Due to CrCl <30 mL/min, will reduce dose to 20 mg PO once daily.  Cindi Carbon, PharmD 01/15/22 11:20 AM

## 2022-01-15 NOTE — Consult Note (Signed)
Cardiology Consultation   Patient ID: KINGSLEY GROHOWSKI MRN: [id number]; DOB: 03/09/1930  Admit date: 01/10/2022 Date of Consult: 01/15/2022  PCP:  [name], [place] Prov[facility] Cardiologist:  Kirk Ruths, MD        Patient Profile:   Emily Robertson is a 88 y.o. female with a hx of paroxysmal atrial fibrillation, rheumatoid arthritis on immunomodulatory therapy, and severe degenerative mitral regurgitation who is being seen 01/15/2022 for the evaluation of acute on chronic diastolic heart failure in the context of severe mitral regurgitation at the request of Drs. Turner and Sheikh.  History of Present Illness:   Emily Robertson is an 88 year old female with history of paroxysmal atrial fibrillation, frailty, rheumatoid arthritis on immune O modulatory therapy, and severe degenerative mitral regurgitation with P2 flail was admitted with acute on chronic diastolic heart failure.  She was treated with IV Lasix, Cardizem, and metoprolol.  She was diuresed approximately 2 L.  I had seen her previously in regards to her mitral regurgitation.  At that point in time the patient was not interested in mitral transcatheter edge-to-edge repair.  After her hospitalization, she and her son an opportunity to discuss further and they are more interested in pursuing therapy.    FOCUSED CARDIOVASCULAR PROBLEM LIST:   1.  Paroxysmal atrial fibrillation on apixaban; CV 2 score of 5 2.  Rheumatoid arthritis on Leflunomide 3.  Hypertension 4.  Osteoporosis 5.  Severe degenerative mitral regurgitation with ruptured cord involving P2; mean gradient of 2 mmHg with valve area of 6 cm 6.  Frailty    Past Surgical History:  Procedure Laterality Date   PUBOVAGINAL SLING N/A 08/31/2012   Procedure: BOSTON SCIENTIFIC UPHOLD LIGHT [place];  Surgeon: Ailene Rud, MD;  Location: [facility];  Service: Urology;  Laterality: N/A;   RETINAL DETACHMENT  SURGERY Right    TEE WITHOUT CARDIOVERSION N/A 09/05/2021   Procedure: TRANSESOPHAGEAL ECHOCARDIOGRAM (TEE);  Surgeon: Sanda Klein, MD;  Location: [facility];  Service: Cardiovascular;  Laterality: N/A;       Inpatient Medications: Scheduled Meds:  diltiazem  180 mg Oral Daily   famotidine  20 mg Oral BID   feeding supplement  237 mL Oral TID BM   furosemide  80 mg Intravenous Q12H   insulin aspart  0-6 Units Subcutaneous Q4H   leflunomide  10 mg Oral q morning   metoprolol succinate  150 mg Oral q morning   montelukast  10 mg Oral Daily   predniSONE  2.5 mg Oral Q breakfast   sodium chloride flush  3 mL Intravenous Q12H   spironolactone  12.5 mg Oral Daily   Continuous Infusions:  sodium chloride     heparin 850 Units/hr (01/15/22 0223)   PRN Meds: sodium chloride, acetaminophen, ondansetron (ZOFRAN) IV, mouth rinse, sodium chloride flush  Allergies:    Allergies  Allergen Reactions   Cozaar [Losartan] Swelling    Tongue swelling   Erythromycin Itching   Azopt [Brinzolamide] Rash    Rash on eyelid    Social History:   Social History   Socioeconomic History   Marital status: Widowed    Spouse name: Not on file   Number of children: Not on file   Years of education: Not on file   Highest education level: Not on file  Occupational History   Not on file  Tobacco Use   Smoking status: Never   Smokeless tobacco: Never  Vaping Use  Vaping Use: Never used  Substance and Sexual Activity   Alcohol use: Not Currently    Comment: rare   Drug use: No   Sexual activity: Not on file  Other Topics Concern   Not on file  Social History Narrative   Not on file   Social Determinants of Health   Financial Resource Strain: Not on file  Food Insecurity: No Food Insecurity (01/11/2022)   Hunger Vital Sign    Worried About Running Out of Food in the Last Year: Never true    Ran Out of Food in the Last Year: Never true  Transportation Needs: No Transportation Needs  (01/11/2022)   PRAPARE - Hydrologist (Medical): No    Lack of Transportation (Non-Medical): No  Physical Activity: Not on file  Stress: Not on file  Social Connections: Not on file  Intimate Partner Violence: Not At Risk (01/11/2022)   Humiliation, Afraid, Rape, and Kick questionnaire    Fear of Current or Ex-Partner: No    Emotionally Abused: No    Physically Abused: No    Sexually Abused: No    Family History:    Family History  Problem Relation Age of Onset   Hypertension Mother    Thyroid disease Neg Hx    Diabetes Neg Hx      ROS:  Please see the history of present illness.   All other ROS reviewed and negative.     Physical Exam/Data:   Vitals:   01/14/22 1217 01/14/22 2017 01/15/22 0431 01/15/22 0434  BP: 117/70 (!) 102/52  124/66  Pulse: 82 72  75  Resp: 17 18  18   Temp: 97.8 F (36.6 C) 98.2 F (36.8 C)  97.8 F (36.6 C)  TempSrc: Oral Oral  Oral  SpO2: 98%   100%  Weight:   43.5 kg   Height:        Intake/Output Summary (Last 24 hours) at 01/15/2022 [id number] Last data filed at 01/15/2022 [id number] Gross per 24 hour  Intake 772.77 ml  Output 350 ml  Net 422.77 ml      01/15/2022    4:31 AM 01/14/2022    5:00 AM 01/13/2022    5:00 AM  Last 3 Weights  Weight (lbs) 95 lb 14.4 oz 100 lb 1.4 oz 95 lb 3.8 oz  Weight (kg) 43.5 kg 45.4 kg 43.2 kg     Body mass index is 16.99 kg/m.  General:  Well nourished, well developed, in no acute distress HEENT: normal Neck: no JVD Vascular: No carotid bruits; Distal pulses 2+ bilaterally Cardiac: Irregular rate and rhythm with 3 out of 6 holosystolic murmur best heard in the precordium Lungs:  clear to auscultation bilaterally, no wheezing, rhonchi or rales  Abd: soft, nontender, no hepatomegaly  Ext: no edema Musculoskeletal:  No deformities, BUE and BLE strength normal and equal Skin: warm and dry  Neuro:  CNs 2-12 intact, no focal abnormalities noted Psych:  Normal affect   EKG:  The  EKG was personally reviewed and demonstrates:  November 2023 atrial fibrillation with a controlled ventricular rate  Telemetry:  Telemetry was personally reviewed and demonstrates: Rate controlled atrial fibrillation  Relevant CV Studies: EKG: November 2023 atrial fibrillation with a controlled ventricular rate   2D ECHO: November 2023  1. Prolapse of posterior MV leaflet with severe, eccentric MR.   2. Left ventricular ejection fraction, by estimation, is 60 to 65%. The  left ventricle has normal function. The left ventricle  has no regional  wall motion abnormalities. Left ventricular diastolic parameters are  indeterminate.   3. Right ventricular systolic function is normal. The right ventricular  size is normal. There is mildly elevated pulmonary artery systolic  pressure.   4. Left atrial size was severely dilated.   5. Right atrial size was moderately dilated.   6. The mitral valve is abnormal. Severe mitral valve regurgitation. No  evidence of mitral stenosis. There is moderate late systolic prolapse of  post. leaflet of the mitral valve.   7. Tricuspid valve regurgitation is moderate to severe.   8. The aortic valve is tricuspid. Aortic valve regurgitation is mild.  Aortic valve sclerosis/calcification is present, without any evidence of  aortic stenosis.   9. The inferior vena cava is normal in size with greater than 50%  respiratory variability, suggesting right atrial pressure of 3 mmHg.    TEE: Ejection fraction is 65 to 70% with ruptured cord in the middle of P2 with fill gap of 5 mm and flail with of 9 mm with posterior leaflet length of 1.5 cm with a valve area of 6 cm grade and mean gradient 2 mmHg  Laboratory Data:  High Sensitivity Troponin:   Recent Labs  Lab 01/10/22 1941 01/10/22 2156  TROPONINIHS 12 11     Chemistry Recent Labs  Lab 01/13/22 0909 01/14/22 0149 01/15/22 0442  NA 135 130* 131*  K 3.6 3.5 3.2*  CL 92* 92* 93*  CO2 33* 29 27  GLUCOSE  105* 115* 109*  BUN 36* 41* 38*  CREATININE 1.14* 1.10* 1.00  CALCIUM 8.9 8.9 9.1  MG 2.3 2.1 2.0  GFRNONAA 46* 48* 54*  ANIONGAP 10 9 11     Recent Labs  Lab 01/13/22 0909 01/14/22 0149 01/15/22 0442  PROT 6.2* 6.2* 6.4*  ALBUMIN 3.6 3.4* 3.3*  AST 64* 49* 38  ALT 119* 95* 80*  ALKPHOS 194* 174* 160*  BILITOT 1.2 1.2 1.0   Lipids No results for input(s): "CHOL", "TRIG", "HDL", "LABVLDL", "LDLCALC", "CHOLHDL" in the last 168 hours.  Hematology Recent Labs  Lab 01/13/22 0909 01/14/22 0149 01/15/22 0442  WBC 10.9* 11.6* 11.1*  RBC 4.01 3.90 4.41  HGB 13.2 12.8 14.5  HCT 40.0 38.3 43.3  MCV 99.8 98.2 98.2  MCH 32.9 32.8 32.9  MCHC 33.0 33.4 33.5  RDW 15.9* 15.6* 15.6*  PLT 194 185 231   Thyroid  Recent Labs  Lab 01/10/22 1941 01/11/22 0111  TSH 10.519*  --   FREET4  --  1.28*    BNP Recent Labs  Lab 01/10/22 1941  BNP 1,851.3*    DDimer No results for input(s): "DDIMER" in the last 168 hours.   Radiology/Studies:  DG CHEST PORT 1 VIEW  Result Date: 01/11/2022 CLINICAL DATA:  Hypoxemia EXAM: PORTABLE CHEST 1 VIEW COMPARISON:  Chest x-ray 01/10/2022.  CT of the chest 11 20. FINDINGS: Small pleural effusions persist. The heart is enlarged. There central pulmonary vascular congestion. There is no lung consolidation or pneumothorax. No acute fractures are seen. IMPRESSION: Cardiomegaly with central pulmonary vascular congestion and small bilateral pleural effusions. Electronically Signed   By: 01/12/2022 M.D.   On: 01/11/2022 18:51   ECHOCARDIOGRAM COMPLETE  Result Date: 01/11/2022    ECHOCARDIOGRAM REPORT   Patient Name:   NIKOL LEMAR Date of Exam: 01/11/2022 Medical Rec #:  14/02/2021         Height:       63.0 in Accession #:  [id number]        Weight:       101.0 lb Date of Birth:  1930-09-06         BSA:          1.446 m Patient Age:    60 years          BP:           107/95 mmHg Patient Gender: F                 HR:           88 bpm. Exam Location:   Inpatient Procedure: 2D Echo Indications:    CHF  History:        Patient has prior history of Echocardiogram examinations, most                 recent 07/26/2021. CHF, Mitral Valve Prolapse, Arrythmias:Atrial                 Fibrillation; Risk Factors:Hypertension.  Sonographer:    Harvie Junior Referring Phys: [id number] [facility]  Sonographer Comments: Restraints. IMPRESSIONS  1. Prolapse of posterior MV leaflet with severe, eccentric MR.  2. Left ventricular ejection fraction, by estimation, is 60 to 65%. The left ventricle has normal function. The left ventricle has no regional wall motion abnormalities. Left ventricular diastolic parameters are indeterminate.  3. Right ventricular systolic function is normal. The right ventricular size is normal. There is mildly elevated pulmonary artery systolic pressure.  4. Left atrial size was severely dilated.  5. Right atrial size was moderately dilated.  6. The mitral valve is abnormal. Severe mitral valve regurgitation. No evidence of mitral stenosis. There is moderate late systolic prolapse of post. leaflet of the mitral valve.  7. Tricuspid valve regurgitation is moderate to severe.  8. The aortic valve is tricuspid. Aortic valve regurgitation is mild. Aortic valve sclerosis/calcification is present, without any evidence of aortic stenosis.  9. The inferior vena cava is normal in size with greater than 50% respiratory variability, suggesting right atrial pressure of 3 mmHg. FINDINGS  Left Ventricle: Left ventricular ejection fraction, by estimation, is 60 to 65%. The left ventricle has normal function. The left ventricle has no regional wall motion abnormalities. The left ventricular internal cavity size was normal in size. There is  no left ventricular hypertrophy. Left ventricular diastolic parameters are indeterminate. Right Ventricle: The right ventricular size is normal. Right ventricular systolic function is normal. There is mildly elevated pulmonary artery  systolic pressure. The tricuspid regurgitant velocity is 2.97 m/s, and with an assumed right atrial pressure of 3 mmHg, the estimated right ventricular systolic pressure is 0000000 mmHg. Left Atrium: Left atrial size was severely dilated. Right Atrium: Right atrial size was moderately dilated. Pericardium: There is no evidence of pericardial effusion. Mitral Valve: The mitral valve is abnormal. There is moderate late systolic prolapse of post. leaflet of the mitral valve. Mild mitral annular calcification. Severe mitral valve regurgitation. No evidence of mitral valve stenosis. Tricuspid Valve: The tricuspid valve is normal in structure. Tricuspid valve regurgitation is moderate to severe. No evidence of tricuspid stenosis. Aortic Valve: The aortic valve is tricuspid. Aortic valve regurgitation is mild. Aortic regurgitation PHT measures 819 msec. Aortic valve sclerosis/calcification is present, without any evidence of aortic stenosis. Aortic valve mean gradient measures 2.0  mmHg. Aortic valve peak gradient measures 3.5 mmHg. Aortic valve area, by VTI measures 2.24 cm. Pulmonic Valve: The pulmonic valve was normal in structure. Pulmonic  valve regurgitation is trivial. No evidence of pulmonic stenosis. Aorta: The aortic root is normal in size and structure. Venous: The inferior vena cava is normal in size with greater than 50% respiratory variability, suggesting right atrial pressure of 3 mmHg. IAS/Shunts: No atrial level shunt detected by color flow Doppler. Additional Comments: Prolapse of posterior MV leaflet with severe, eccentric MR.  LEFT VENTRICLE PLAX 2D LVIDd:         4.00 cm LVIDs:         2.90 cm LV PW:         1.10 cm LV IVS:        1.10 cm LVOT diam:     2.00 cm LV SV:         28 LV SV Index:   20 LVOT Area:     3.14 cm  LV Volumes (MOD) LV vol d, MOD A2C: 57.6 ml LV vol d, MOD A4C: 44.8 ml LV vol s, MOD A2C: 25.5 ml LV vol s, MOD A4C: 21.2 ml LV SV MOD A2C:     32.1 ml LV SV MOD A4C:     44.8 ml LV SV  MOD BP:      31.1 ml RIGHT VENTRICLE RV Basal diam:  4.40 cm RV Mid diam:    3.80 cm RV S prime:     7.18 cm/s TAPSE (M-mode): 1.6 cm LEFT ATRIUM              Index        RIGHT ATRIUM           Index LA diam:        2.80 cm  1.94 cm/m   RA Area:     20.70 cm LA Vol (A2C):   101.0 ml 69.83 ml/m  RA Volume:   56.60 ml  39.13 ml/m LA Vol (A4C):   83.6 ml  57.80 ml/m LA Biplane Vol: 96.5 ml  66.72 ml/m  AORTIC VALVE                    PULMONIC VALVE AV Area (Vmax):    1.72 cm     PV Vmax:       0.56 m/s AV Area (Vmean):   1.79 cm     PV Peak grad:  1.2 mmHg AV Area (VTI):     2.24 cm AV Vmax:           93.30 cm/s AV Vmean:          57.200 cm/s AV VTI:            0.126 m AV Peak Grad:      3.5 mmHg AV Mean Grad:      2.0 mmHg LVOT Vmax:         51.00 cm/s LVOT Vmean:        32.600 cm/s LVOT VTI:          0.090 m LVOT/AV VTI ratio: 0.71 AI PHT:            819 msec  AORTA Ao Root diam: 3.30 cm Ao Asc diam:  3.30 cm MITRAL VALVE                  TRICUSPID VALVE MV Area (PHT): 4.36 cm       TR Peak grad:   35.3 mmHg MV Decel Time: 174 msec       TR Vmax:        297.00 cm/s MR Peak  grad:    75.9 mmHg MR Mean grad:    57.0 mmHg    SHUNTS MR Vmax:         435.50 cm/s  Systemic VTI:  0.09 m MR Vmean:        355.0 cm/s   Systemic Diam: 2.00 cm MR PISA:         6.28 cm MR PISA Eff ROA: 33 mm MR PISA Radius:  1.00 cm MV E velocity: 93.80 cm/s MV A velocity: 46.30 cm/s MV E/A ratio:  2.03 Kirk Ruths MD Electronically signed by Kirk Ruths MD Signature Date/Time: 01/11/2022/4:23:51 PM    Final      Assessment and Plan:   Acute on chronic diastolic heart failure: This is likely from the patient's severe degenerative mitral regurgitation.  She has been stabilized medically with aggressive IV diuretics and rate control for atrial fibrillation Severe degenerative mitral regurgitation with P2 prolapse: The patient previously declined mitral transcatheter edge-to-edge repair.  I reviewed all of her clinical data.   She seems to have anatomy that is amenable to therapy.  She will need coronary angiography and right heart catheterization study as well as a cardiothoracic surgical opinion as this represents degenerative mitral regurgitation.  I will refer her for diagnostic coronary angiography and right heart catheterization today.  Following this procedure we will plan on discharge and follow-up with me on Friday.  I have discussed this plan with the patient and her son [name] who are in agreement.  We will have the patient see cardiothoracic surgery next week. Rheumatoid arthritis on immunomodulatory's: Given the fact she is on chronic immunomodulatory therapy this may suggest more friable tissue; her mitral valve leaflets however do not look thin or excessively friable.  This will inform our choice of clip prosthesis.   I have reviewed the risks, indications, and alternatives to cardiac catheterization, possible angioplasty, and stenting with the patient. Risks include but are not limited to bleeding, infection, vascular injury, stroke, myocardial infection, arrhythmia, kidney injury, radiation-related injury in the case of prolonged fluoroscopy use, emergency cardiac surgery, and death. The patient understands the risks of serious complication is 1-2 in [id number] with diagnostic cardiac cath and 1-2% or less with angioplasty/stenting.    Risk Assessment/Risk Scores:        New York Heart Association (NYHA) Functional Class NYHA Class IV  CHA2DS2-VASc Score = 6   This indicates a 9.7% annual risk of stroke. The patient's score is based upon: CHF History: 1 HTN History: 1 Diabetes History: 0 Stroke History: 0 Vascular Disease History: 1 Age Score: 2 Gender Score: 1         For questions or updates, please contact [place] Please consult [email or web address] for contact info under    Signed, Early Osmond, MD  01/15/2022 7:02 AM

## 2022-01-15 NOTE — TOC Initial Note (Addendum)
Transition of Care [facility]) - Initial/Assessment Note    Patient Details  Name: Emily Robertson MRN: [id number] Date of Birth: 12-22-1930  [facility]) CM/SW Contact:    Lanier Clam, RN Phone Number: 01/15/2022, 10:50 AM  Clinical Narrative: From [facility]-spoke to son Allen-wants higher level of care-he has already been in contact with [facility] rep Aggie Cosier. I have left vm w/Katie Osmond @ [facility]-await call back. Faxed fl2 to [facility]. HHTP/OT-already active w/ [facility] HHC rep Misty Stanley following.  -12:51p-Numerous messages left w/Katie Emilia Beck rep for [facility] level,also Dir Adm Lawson Fiscal, Minerva Areola Tywan,Amber;spoke to Doy Mince of [facility] [address] but she was unable to asst since she is @ a sister facility.  Supv notified.  Also attempted to contact [phone] per MD request to asst w/the discharge no answer.              Expected Discharge Plan: Assisted Living Barriers to Discharge: Continued Medical Work up   Patient Goals and CMS Choice Patient states their goals for this hospitalization and ongoing recovery are::  (ALF) CMS Medicare.gov Compare Post Acute Care list provided to:: Patient Represenative (must comment) (Allen(Son)) Choice offered to / list presented to : Adult Children  Expected Discharge Plan and Services Expected Discharge Plan: Assisted Living   Discharge Planning Services: CM Consult Post Acute Care Choice: Home Health Living arrangements for the past 2 months: Independent Living Facility                             [facility] Agency: [facility] Date [facility] Agency Contacted: 01/15/22 Time HH Agency Contacted: 1049 Representative spoke with at [facility] Agency: Misty Stanley covering for [employer]  Prior Living Arrangements/Services Living arrangements for the past 2 months: Independent Living Facility Lives with:: Facility Resident Patient language and need for interpreter reviewed:: Yes         Need for Family Participation in Patient Care: Yes (Comment) Care giver support system in place?: Yes (comment) Current home services: Home PT, Home OT [facility] HHPT/OT) Criminal Activity/Legal Involvement Pertinent to Current Situation/Hospitalization: No - Comment as needed  Activities of Daily Living Home Assistive Devices/Equipment: Environmental consultant (specify type), Shower chair without back ADL Screening (condition at time of admission) Patient's cognitive ability adequate to safely complete daily activities?: No Is the patient deaf or have difficulty hearing?: No Does the patient have difficulty seeing, even when wearing glasses/contacts?: Yes Does the patient have difficulty concentrating, remembering, or making decisions?: Yes Patient able to express need for assistance with ADLs?: No Does the patient have difficulty dressing or bathing?: Yes Independently performs ADLs?: No Communication: Needs assistance Is this a change from baseline?: Pre-admission baseline Dressing (OT): Needs assistance Is this a change from baseline?: Pre-admission baseline Grooming: Needs assistance Is this a change from baseline?: Pre-admission baseline Feeding: Needs assistance Is this a change from baseline?: Pre-admission baseline Bathing: Needs assistance Is this a change from baseline?: Pre-admission baseline Toileting: Needs assistance Is this a change from baseline?: Pre-admission baseline In/Out Bed: Needs assistance Is this a change from baseline?: Pre-admission baseline Walks in Home: Independent with device (comment) Does the patient have difficulty walking or climbing stairs?: Yes Weakness of Legs: Both Weakness of Arms/Hands: None  Permission Sought/Granted Permission sought to share information with : Case Manager Permission granted to share information with : Yes, Verbal Permission Granted  Share Information with NAME:  (Case manager)  Emotional Assessment Appearance::  Appears stated age Attitude/Demeanor/Rapport: Gracious Affect (typically observed): Accepting Orientation: : Oriented to Self, Oriented to Place, Oriented to  Time, Oriented to Situation Alcohol / Substance Use: Not Applicable Psych Involvement: No (comment)  Admission diagnosis:  Hypoxia [R09.02] Acute exacerbation of CHF (congestive heart failure) (HCC) [I50.9] Acute CHF (congestive heart failure) (HCC) [I50.9] Mitral valve insufficiency, unspecified etiology [I34.0] Congestive heart failure, unspecified HF chronicity, unspecified heart failure type (HCC) [I50.9] Patient Active Problem List   Diagnosis Date Noted   Permanent atrial fibrillation (HCC) 01/14/2022   Rash 01/14/2022   Palliative care by specialist 01/14/2022   Goals of care, counseling/discussion 01/14/2022   Acute on chronic respiratory failure with hypoxia (HCC) 01/10/2022   Mitral valve prolapse 01/10/2022   Acute CHF (congestive heart failure) (HCC) 01/10/2022   Protein-calorie malnutrition, severe 09/05/2021   Acute on chronic diastolic CHF (congestive heart failure) (HCC) 09/05/2021   Mitral valve insufficiency    Acute exacerbation of CHF (congestive heart failure) (HCC) 09/03/2021   Dyspnea 09/03/2021   Hyponatremia 09/03/2021   Elevated LFTs 09/03/2021   Leukocytosis 09/03/2021   Glaucoma 09/03/2021   Lung nodule 09/03/2021   Pleural effusion 09/03/2021   Atrial fibrillation with RVR (HCC)    Acute heart failure with preserved ejection fraction (HCC)    Osteoporosis 07/25/2021   Rheumatoid arthritis (HCC) 07/25/2021   Pure hypercholesterolemia 07/25/2021   Paroxysmal atrial fibrillation (HCC) 07/25/2021   Fall at home, initial encounter 07/25/2021   Closed pelvic fracture (HCC) 07/25/2021   Hyperthyroidism 08/03/2013   Angioedema 11/29/2012   HTN (hypertension) 11/29/2012   PCP:  Rogers Blocker, FNP Pharmacy:   [facility] [address] [place] - [address] [place] [address] Phone: [phone] Fax: [phone]     Social Determinants of Health (SDOH) Interventions    Readmission Risk Interventions     No data to display

## 2022-01-15 NOTE — Progress Notes (Signed)
Physical Therapy Treatment Patient Details Name: Emily Robertson MRN: [id number] DOB: 1930/09/24 Today's Date: 01/15/2022   History of Present Illness 88 year old female with history of essential hypertension and dyslipidemia who recently moved to independent living who was found confused and hypoxemic so brought to ER.  In the emergency room noted to be 80% on room air needing 3 L of oxygen.  BNP 1800.  Cardiac enzymes negative.  Skeletal survey negative.  Admitted due to hypoxemia, altered mental status. Dx of severe mitral valve regurgitation, volume overload.     PT Comments    Pt is progressing well with mobility, she ambulated 200' with RW, no loss of balance, no dyspnea.  Noted plan for cardiac catheterization later today.   Recommendations for follow up therapy are one component of a multi-disciplinary discharge planning process, led by the attending physician.  Recommendations may be updated based on patient status, additional functional criteria and insurance authorization.  Follow Up Recommendations  Home health PT     Assistance Recommended at Discharge Intermittent Supervision/Assistance  Patient can return home with the following A little help with walking and/or transfers;A little help with bathing/dressing/bathroom   Equipment Recommendations  None recommended by PT    Recommendations for Other Services       Precautions / Restrictions Precautions Precautions: Fall Restrictions Weight Bearing Restrictions: No     Mobility  Bed Mobility Overal bed mobility: Modified Independent Bed Mobility: Supine to Sit     Supine to sit: HOB elevated, Modified independent (Device/Increase time)     General bed mobility comments: used bedrail    Transfers Overall transfer level: Needs assistance Equipment used: Rolling walker (2 wheels) Transfers: Sit to/from Stand Sit to Stand: Supervision           General transfer comment: VCs hand placement     Ambulation/Gait Ambulation/Gait assistance: Supervision Gait Distance (Feet): 200 Feet Assistive device: Rolling walker (2 wheels) Gait Pattern/deviations: Step-through pattern, Decreased stride length Gait velocity: WFL     General Gait Details: steady with RW, no dyspnea, no loss of balance   Stairs             Wheelchair Mobility    Modified Rankin (Stroke Patients Only)       Balance Overall balance assessment: Modified Independent   Sitting balance-Leahy Scale: Normal     Standing balance support: Bilateral upper extremity supported, Reliant on assistive device for balance, During functional activity Standing balance-Leahy Scale: Fair                              Cognition Arousal/Alertness: Awake/alert Behavior During Therapy: WFL for tasks assessed/performed Overall Cognitive Status: Within Functional Limits for tasks assessed                                          Exercises      General Comments        Pertinent Vitals/Pain Pain Assessment Pain Assessment: No/denies pain    Home Living                          Prior Function            PT Goals (current goals can now be found in the care plan section) Acute Rehab PT Goals Patient Stated Goal: I hope  to be able to get back to my apartment or I may have to go to assitive living. PT Goal Formulation: With patient Time For Goal Achievement: 01/26/22 Potential to Achieve Goals: Good Progress towards PT goals: Progressing toward goals    Frequency    Min 3X/week      PT Plan Current plan remains appropriate    Co-evaluation              AM-PAC PT "6 Clicks" Mobility   Outcome Measure  Help needed turning from your back to your side while in a flat bed without using bedrails?: None Help needed moving from lying on your back to sitting on the side of a flat bed without using bedrails?: None Help needed moving to and from a bed to a  chair (including a wheelchair)?: A Little Help needed standing up from a chair using your arms (e.g., wheelchair or bedside chair)?: A Little Help needed to walk in hospital room?: A Little Help needed climbing 3-5 steps with a railing? : A Little 6 Click Score: 20    End of Session Equipment Utilized During Treatment: Gait belt Activity Tolerance: Patient tolerated treatment well Patient left: in chair;with chair alarm set;with call bell/phone within reach Nurse Communication: Mobility status;Other (comment) (pt able to walk to bathroom, doesn't need purewick) PT Visit Diagnosis: Muscle weakness (generalized) (M62.81)     Time: 0388-8280 PT Time Calculation (min) (ACUTE ONLY): 11 min  Charges:  $Gait Training: 8-22 mins                     Ralene Bathe Kistler PT 01/15/2022  Acute Rehabilitation Services  Office [phone]

## 2022-01-15 NOTE — Progress Notes (Signed)
TR BAND REMOVAL  LOCATION:    right radial  DEFLATED PER PROTOCOL:    Yes.    TIME BAND OFF / DRESSING APPLIED: 1827  SITE UPON ARRIVAL:    Level 0  SITE AFTER BAND REMOVAL:    Level 0  CIRCULATION SENSATION AND MOVEMENT:    Within Normal Limits   Yes.    COMMENTS:   Right radial pulse was bounding, 3+, capillary refill < 3 sec and no hematoma present.

## 2022-01-15 NOTE — Interval H&P Note (Signed)
History and Physical Interval Note:  01/15/2022 1:32 PM  Emily Robertson  has presented today for surgery, with the diagnosis of mtral valve evaluation.  The various methods of treatment have been discussed with the patient and family. After consideration of risks, benefits and other options for treatment, the patient has consented to  Procedure(s): RIGHT/LEFT HEART CATH AND CORONARY ANGIOGRAPHY (N/A) as a surgical intervention.  The patient's history has been reviewed, patient examined, no change in status, stable for surgery.  I have reviewed the patient's chart and labs.  Questions were answered to the patient's satisfaction.     Orbie Pyo

## 2022-01-15 NOTE — Progress Notes (Signed)
ANTICOAGULATION CONSULT NOTE - Follow Up  Pharmacy Consult for Heparin while Eliquis on hold Indication: atrial fibrillation  Allergies  Allergen Reactions   Cozaar [Losartan] Swelling    Tongue swelling   Erythromycin Itching   Azopt [Brinzolamide] Rash    Rash on eyelid    Patient Measurements: Height: 5\' 3"  (160 cm) Weight: 43.5 kg (95 lb 14.4 oz) IBW/kg (Calculated) : 52.4 Heparin Dosing Weight: n/a. Use total body weight  Vital Signs: Temp: 97.8 F (36.6 C) (12/05 0434) Temp Source: Oral (12/05 0434) BP: 124/66 (12/05 0434) Pulse Rate: 75 (12/05 0434)  Labs: Recent Labs    01/13/22 0909 01/13/22 0909 01/14/22 0149 01/14/22 1023 01/14/22 2104 01/15/22 0442  HGB 13.2  --  12.8  --   --  14.5  HCT 40.0  --  38.3  --   --  43.3  PLT 194  --  185  --   --  231  APTT  --    < > 50* 52* 74* 86*  HEPARINUNFRC  --   --  >1.10*  --   --  0.75*  CREATININE 1.14*  --  1.10*  --   --  1.00   < > = values in this interval not displayed.     Estimated Creatinine Clearance: 26.2 mL/min (by C-G formula based on SCr of 1 mg/dL).   Medical History: Past Medical History:  Diagnosis Date   Arthritis    Glaucoma of both eyes    Hyperlipidemia    Hypertension    Pelvic prolapse    anterior floor   Urgency of urination    Urinary hesitancy    Urticaria     Medications:  Scheduled:   diltiazem  180 mg Oral Daily   famotidine  20 mg Oral BID   feeding supplement  237 mL Oral TID BM   furosemide  80 mg Intravenous Q12H   insulin aspart  0-6 Units Subcutaneous Q4H   leflunomide  10 mg Oral q morning   metoprolol succinate  150 mg Oral q morning   montelukast  10 mg Oral Daily   predniSONE  2.5 mg Oral Q breakfast   sodium chloride flush  3 mL Intravenous Q12H   spironolactone  12.5 mg Oral Daily   Infusions:   sodium chloride     heparin 850 Units/hr (01/15/22 0223)   PRN: sodium chloride, acetaminophen, ondansetron (ZOFRAN) IV, mouth rinse, sodium chloride  flush  Assessment: 88 yo female on chronic Eliquis for afib, admitted with acute hypoxic respiratory failure secondary to volume overload. Cardiology consulted, transitioning to IV heparin in case invasive procedures needed. Pt being worked up for 92.   -Last dose of apixaban: 12/3 @ 0907  Today, 01/15/22 Confirmatory aPTT = 86 seconds remains therapeutic on heparin infusion of 850 units/hr. HL (0.75) remains falsely elevated. CBC: WNL, stable Confirmed with RN that heparin infusing at correct rate. No line issues/interruptions. No signs of bleeding.  SCr stable  Monitoring using aPTT at this time since HL falsely elevated due to recent DOAC.  Goal of Therapy:  aPTT 66-102 seconds Heparin level 0.3-0.7 units/ml Monitor platelets by anticoagulation protocol: Yes   Plan:  Continue heparin infusion at 850 units/hr HL/aPTT, CBC daily while on heparin infusion. Once HL and aPTT correlate, can monitor using HL only. Monitor for signs of bleeding  If heparin drip needs to be held prior to any invasive procedures, please provide guidance on when to hold heparin.  14/05/23  Shelda Jakes, PharmD 01/15/22 6:38 AM

## 2022-01-16 ENCOUNTER — Encounter (HOSPITAL_COMMUNITY): Payer: Self-pay | Admitting: Internal Medicine

## 2022-01-16 LAB — GLUCOSE, CAPILLARY
Glucose-Capillary: 109 mg/dL — ABNORMAL HIGH (ref 70–99)
Glucose-Capillary: 143 mg/dL — ABNORMAL HIGH (ref 70–99)
Glucose-Capillary: 102 mg/dL — ABNORMAL HIGH (ref 70–99)
Glucose-Capillary: 104 mg/dL — ABNORMAL HIGH (ref 70–99)

## 2022-01-16 MED ORDER — APIXABAN 2.5 MG PO TABS
2.5000 mg | ORAL_TABLET | Freq: Two times a day (BID) | ORAL | Status: DC
  Administered 2022-01-16: 2.5 mg via ORAL
  Filled 2022-01-16: qty 1

## 2022-01-16 NOTE — Progress Notes (Signed)
  Daily Progress Note   Patient Name: Emily Robertson       Date: 01/16/2022 DOB: [birth year]  Age: 88 y.o. MRN#: [id number] Attending Physician: Lewie Chamber, MD Primary Care Physician: Rogers Blocker, FNP Admit Date: 01/10/2022 Length of Stay: 6 days  Patient last seen by palliative provider Dr. Linna Darner on 01/14/22. Patient has decided to undergo further workup for mitral valve repair. These goals for care have remained the same during patient's hospitalization. As goals for medical care are currently determined and patient undergoing further surgical workup, palliative care team will sign off. Please reach out if our team can be of further assistance in the future especially if surgery does not proceed. Thank you for involving our team in patient's care.    Alvester Morin, DO Palliative Care Provider PMT # [phone]

## 2022-01-16 NOTE — Progress Notes (Signed)
Mobility Specialist - Progress Note   01/16/22 0935  Mobility  Activity Ambulated with assistance in hallway  Level of Assistance Contact guard assist, steadying assist  Assistive Device Other (Comment) (rollator)  Distance Ambulated (ft) 480 ft  Range of Motion/Exercises Active  Activity Response Tolerated well  Mobility Referral Yes  $Mobility charge 1 Mobility   Pt was found in bed and agreeable to ambulate. Tends to wonder when ambulating and needs cues to avoid hitting objects in hallways. At EOS returned to recliner chair with necessities in reach and chair alarm on.  Billey Chang Mobility Specialist

## 2022-01-16 NOTE — Progress Notes (Signed)
Occupational Therapy Treatment Patient Details Name: Emily Robertson MRN: [id number] DOB: [birth year] Today's Date: 01/16/2022   History of present illness 88 year old female with history of essential hypertension and dyslipidemia who recently moved to independent living who was found confused and hypoxemic so brought to ER.    Admitted due to hypoxemia, altered mental status. Dx of severe mitral valve regurgitation, volume overload.   OT comments  Pt required SBA for donning her socks seated EOB, and min guard assist for toileting at bathroom level as well as for hand washing in standing at sink level. She reported feelings of slight fatigue with progressive activity. She was educated on implementing simple energy conservation strategies as needed. Overall, she is making gradual functional progress towards her OT goals. Continue OT plan of care.    Recommendations for follow up therapy are one component of a multi-disciplinary discharge planning process, led by the attending physician.  Recommendations may be updated based on patient status, additional functional criteria and insurance authorization.    Follow Up Recommendations  Home health OT     Assistance Recommended at Discharge Intermittent Supervision/Assistance  Patient can return home with the following  A little help with bathing/dressing/bathroom;Assistance with cooking/housework;Direct supervision/assist for financial management;Direct supervision/assist for medications management;Help with stairs or ramp for entrance   Equipment Recommendations  None recommended by OT       Precautions / Restrictions Restrictions Weight Bearing Restrictions: No       Mobility Bed Mobility Overal bed mobility: Modified Independent Bed Mobility: Supine to Sit, Sit to Supine           General bed mobility comments: used bedrail    Transfers Overall transfer level: Needs assistance   Transfers: Sit to/from Stand Sit to Stand:  Min guard           General transfer comment: VCs hand placement         ADL either performed or assessed with clinical judgement   ADL Overall ADL's : Needs assistance/impaired Eating/Feeding: Independent Eating/Feeding Details (indicate cue type and reason): based on clinical judgement Grooming: Min guard;Standing Grooming Details (indicate cue type and reason): She performed hand washing in standing at the sink. She was instructed to step closer to the sink in order to perform.         Upper Body Dressing : Set up;Sitting Upper Body Dressing Details (indicate cue type and reason): simulated Lower Body Dressing: Supervision/safety Lower Body Dressing Details (indicate cue type and reason): She donned her socks seated EOB. Toilet Transfer: Min guard;Rolling walker (2 wheels);Ambulation Toilet Transfer Details (indicate cue type and reason): 1 verbal cue provided for use of grab bar for added support as needed Toileting- Clothing Manipulation and Hygiene: Min guard;Sit to/from stand Toileting - Clothing Manipulation Details (indicate cue type and reason): She performed seated hygiene with SBA, however required min guard assist in standing while she performed clothing management.              Cognition Arousal/Alertness: Awake/alert Behavior During Therapy: WFL for tasks assessed/performed Overall Cognitive Status: Within Functional Limits for tasks assessed                      Pertinent Vitals/ Pain       Pain Assessment Pain Location: She reported having slight discomfort of her L knuckles.         Frequency  Min 2X/week        Progress Toward Goals  OT Goals(current goals can  now be found in the care plan section)  Progress towards OT goals: Progressing toward goals  Acute Rehab OT Goals Patient Stated Goal: to get stronger OT Goal Formulation: With patient Time For Goal Achievement: 01/27/22 Potential to Achieve Goals: Good  Plan Discharge  plan remains appropriate       AM-PAC OT "6 Clicks" Daily Activity     Outcome Measure   Help from another person eating meals?: None Help from another person taking care of personal grooming?: None Help from another person toileting, which includes using toliet, bedpan, or urinal?: A Little Help from another person bathing (including washing, rinsing, drying)?: A Little Help from another person to put on and taking off regular upper body clothing?: None Help from another person to put on and taking off regular lower body clothing?: A Little 6 Click Score: 21    End of Session Equipment Utilized During Treatment: Rolling walker (2 wheels)  OT Visit Diagnosis: Unsteadiness on feet (R26.81)   Activity Tolerance Patient tolerated treatment well   Patient Left in bed;with call bell/phone within reach;with bed alarm set   Nurse Communication Mobility status        Time: 0350-0938 OT Time Calculation (min): 21 min  Charges: OT General Charges $OT Visit: 1 Visit OT Treatments $Self Care/Home Management : 8-22 mins     Reuben Likes, OTR/L 01/16/2022, 3:50 PM

## 2022-01-16 NOTE — TOC Transition Note (Signed)
Transition of Care [facility]) - CM/SW Discharge Note   Patient Details  Name: Emily Robertson MRN: [id number] Date of Birth: 29-Feb-1930  Transition of Care [facility]) CM/SW Contact:  Golda Acre, RN Phone Number: 01/16/2022, 1:48 PM   Clinical Narrative:    Patient discharge to return back to assisted living with [facility].   Final next level of care: Home w Home Health Services Barriers to Discharge: Barriers Resolved   Patient Goals and CMS Choice Patient states their goals for this hospitalization and ongoing recovery are::  (ALF) CMS Medicare.gov Compare Post Acute Care list provided to:: Patient Represenative (must comment) (Allen(Son)) Choice offered to / list presented to : Adult Children  Discharge Placement                       Discharge Plan and Services   Discharge Planning Services: CM Consult Post Acute Care Choice: Home Health                      [facility] Agency: [facility] Date [facility] Agency Contacted: 01/15/22 Time HH Agency Contacted: 1049 Representative spoke with at [facility] Agency: Misty Stanley covering for [employer] [facility] (SDOH) Interventions     Readmission Risk Interventions   Row Labels 01/15/2022   11:01 AM  Readmission Risk Prevention Plan   Section Header. No data exists in this row.   Transportation Screening   Complete  PCP or Specialist Appt within 3-5 Days   Complete  HRI or Home Care Consult   Complete  Social Work Consult for Recovery Care Planning/Counseling   Complete  Palliative Care Screening   Complete  Medication Review Oceanographer)   Complete

## 2022-01-16 NOTE — TOC Transition Note (Signed)
Transition of Care [facility]) - CM/SW Discharge Note   Patient Details  Name: Emily Robertson MRN: [id number] Date of Birth: [birth year]  Transition of Care [facility]) CM/SW Contact:  Golda Acre, RN Phone Number: 01/16/2022, 2:07 PM   Clinical Narrative:    [facility] called at 1400 for transport.  Packet with information to the nurses station.   Final next level of care: Home w Home Health Services Barriers to Discharge: Barriers Resolved   Patient Goals and CMS Choice Patient states their goals for this hospitalization and ongoing recovery are::  (ALF) CMS Medicare.gov Compare Post Acute Care list provided to:: Patient Represenative (must comment) (Allen(Son)) Choice offered to / list presented to : Adult Children  Discharge Placement                       Discharge Plan and Services   Discharge Planning Services: CM Consult Post Acute Care Choice: Home Health                      [facility] Agency: [facility] Date [facility] Agency Contacted: 01/15/22 Time HH Agency Contacted: 1049 Representative spoke with at [facility] Agency: Misty Stanley covering for [employer] [facility] (SDOH) Interventions     Readmission Risk Interventions   Row Labels 01/15/2022   11:01 AM  Readmission Risk Prevention Plan   Section Header. No data exists in this row.   Transportation Screening   Complete  PCP or Specialist Appt within 3-5 Days   Complete  HRI or Home Care Consult   Complete  Social Work Consult for Recovery Care Planning/Counseling   Complete  Palliative Care Screening   Complete  Medication Review Oceanographer)   Complete

## 2022-01-16 NOTE — TOC Progression Note (Signed)
Transition of Care [facility]) - Progression Note    Patient Details  Name: Emily Robertson MRN: [id number] Date of Birth: [birth year]  Transition of Care [facility]) CM/SW Contact  Golda Acre, RN Phone Number: 01/16/2022, 1:52 PM  Clinical Narrative:    1352 tct katie isman/pt is able to come back.  Transport via ambulance to room 908.  Number to call report is in previous notes.   Expected Discharge Plan: Assisted Living Barriers to Discharge: Barriers Resolved  Expected Discharge Plan and Services Expected Discharge Plan: Assisted Living   Discharge Planning Services: CM Consult Post Acute Care Choice: Home Health Living arrangements for the past 2 months: Independent Living Facility Expected Discharge Date: 01/15/22                           [facility] Agency: [facility] Date [facility] Agency Contacted: 01/15/22 Time HH Agency Contacted: 1049 Representative spoke with at [facility] Agency: Misty Stanley covering for [employer] of Health (SDOH) Interventions    Readmission Risk Interventions   Row Labels 01/15/2022   11:01 AM  Readmission Risk Prevention Plan   Section Header. No data exists in this row.   Transportation Screening   Complete  PCP or Specialist Appt within 3-5 Days   Complete  HRI or Home Care Consult   Complete  Social Work Consult for Recovery Care Planning/Counseling   Complete  Palliative Care Screening   Complete  Medication Review Oceanographer)   Complete

## 2022-01-16 NOTE — Plan of Care (Signed)
Patient seen and rounded on.  Discharge placed 01/15/22. No new issues and no changes from d/c summary.  Continue to plan on d/c to ALF and outpatient follow up with cardiology and endocrinology.   Lewie Chamber, MD [facility] 01/16/2022, 2:06 PM

## 2022-01-17 ENCOUNTER — Telehealth: Payer: Self-pay

## 2022-01-17 NOTE — Progress Notes (Signed)
Patient ID: Emily Robertson MRN: [id number] DOB/AGE: 86/15/1932 86 y.o.  Primary Care Physician:Miller, Rolm Bookbinder, FNP Primary Cardiologist: Olga Millers, MD  PATIENT RESCHEDULED APPOINTMENT   FOCUSED CARDIOVASCULAR PROBLEM LIST:   1.  Paroxysmal atrial fibrillation on apixaban; CV 2 score of 5 2.  Rheumatoid arthritis on Leflunomide 3.  Hypertension 4.  Osteoporosis 5.  Severe degenerative mitral regurgitation with ruptured cord involving P2; mean gradient of 2 mmHg with valve area of 6 cm 6.  Frailty   HISTORY OF PRESENT ILLNESS: August 2023 consultation: The patient is a 88 y.o. female with the indicated medical history here for recommendations regarding her mitral regurgitation.  The patient presented to [facility] in June 2023.  At that point in time she had been admitted to [facility] long hospital after a fall and fracture.  She was noted to be in atrial fibrillation which was new for her.  She was started on a Cardizem drip.  Because of frequent falls she was not thought to be a good candidate for anticoagulation however after family discussion low-dose apixaban was started.  She was discharged and then presented again in late July with acute on chronic diastolic heart failure.  She underwent a TEE which demonstrated severe degenerative mitral regurgitation.  The patient is here with her son.  She is doing fairly well at home however she tells me that she has been tiring out more quickly than before.  Last year she used to be able to do gardening work and would walk around her neighborhood.  Now she has more problems with easy fatigability.  She denies any significant shortness of breath however.  She denies any peripheral edema, paroxysmal nocturnal dyspnea, or orthopnea.  She has had no recurrent falls and has been tolerating anticoagulation well.  Overall she feels like she is getting somewhat better but again endorses easy fatigability which has been worse than a year  ago.  She sees a Education officer, community on a regular basis and has good dental health.  She is walking with a walker now after her fall.  She did not use a walker before.  Plan: Patient was reluctant to undergo invasive therapy; follow-up in 3 months to discuss further.  Today: The patient was hospitalized with acute on chronic diastolic heart failure.  She was diuresed around 1.7 L medically stabilized.  I did see her in the hospital after medical stabilization.  I spoke with her and her son [name] and they have changed their mind about potentially pursuing mitral transcatheter edge-to-edge repair.  They are here to discuss this further today.  Past Medical History:  Diagnosis Date   Arthritis    Glaucoma of both eyes    Hyperlipidemia    Hypertension    Pelvic prolapse    anterior floor   Urgency of urination    Urinary hesitancy    Urticaria     Past Surgical History:  Procedure Laterality Date   PUBOVAGINAL SLING N/A 08/31/2012   Procedure: BOSTON SCIENTIFIC UPHOLD LIGHT SACROSPINUS REPAIR;  Surgeon: Kathi Ludwig, MD;  Location: [facility];  Service: Urology;  Laterality: N/A;   RETINAL DETACHMENT SURGERY Right    RIGHT/LEFT HEART CATH AND CORONARY ANGIOGRAPHY N/A 01/15/2022   Procedure: RIGHT/LEFT HEART CATH AND CORONARY ANGIOGRAPHY;  Surgeon: Orbie Pyo, MD;  Location: [facility] INVASIVE CV LAB;  Service: Cardiovascular;  Laterality: N/A;   TEE WITHOUT CARDIOVERSION N/A 09/05/2021   Procedure: TRANSESOPHAGEAL ECHOCARDIOGRAM (TEE);  Surgeon: Thurmon Fair,  MD;  Location: [facility] ENDOSCOPY;  Service: Cardiovascular;  Laterality: N/A;    Family History  Problem Relation Age of Onset   Hypertension Mother    Thyroid disease Neg Hx    Diabetes Neg Hx     Social History   Socioeconomic History   Marital status: Widowed    Spouse name: Not on file   Number of children: Not on file   Years of education: Not on file   Highest education level: Not on file  Occupational History    Not on file  Tobacco Use   Smoking status: Never   Smokeless tobacco: Never  Vaping Use   Vaping Use: Never used  Substance and Sexual Activity   Alcohol use: Not Currently    Comment: rare   Drug use: No   Sexual activity: Not on file  Other Topics Concern   Not on file  Social History Narrative   Not on file   Social Determinants of Health   Financial Resource Strain: Not on file  Food Insecurity: No Food Insecurity (01/11/2022)   Hunger Vital Sign    Worried About Running Out of Food in the Last Year: Never true    Ran Out of Food in the Last Year: Never true  Transportation Needs: No Transportation Needs (01/11/2022)   PRAPARE - Hydrologist (Medical): No    Lack of Transportation (Non-Medical): No  Physical Activity: Not on file  Stress: Not on file  Social Connections: Not on file  Intimate Partner Violence: Not At Risk (01/11/2022)   Humiliation, Afraid, Rape, and Kick questionnaire    Fear of Current or Ex-Partner: No    Emotionally Abused: No    Physically Abused: No    Sexually Abused: No     Prior to Admission medications   Medication Sig Start Date End Date Taking? Authorizing Provider  acetaminophen (TYLENOL) 325 MG tablet Take 1 tablet (325 mg total) by mouth daily with supper. 09/06/21   Hongalgi, Lenis Dickinson, MD  alendronate (FOSAMAX) 70 MG tablet Take 70 mg by mouth every Wednesday. 08/10/14   [provider]  apixaban (ELIQUIS) 2.5 MG TABS tablet Take 1 tablet (2.5 mg total) by mouth 2 (two) times daily. 07/31/21   Georgette Shell, MD  Ascorbic Acid (VITAMIN C PO) Take 1 tablet by mouth every morning.    [provider]  CALCIUM PO Take 1 tablet by mouth daily with supper.    [provider]  cetirizine (ZYRTEC) 10 MG tablet Take 1 tablet (10 mg total) by mouth 2 (two) times daily. 10/28/19   Kennith Gain, MD  Cholecalciferol (VITAMIN D3 PO) Take 1 capsule by mouth daily with supper.     [provider]  diltiazem (CARDIZEM CD) 180 MG 24 hr capsule Take 1 capsule (180 mg total) by mouth daily. 08/01/21   Georgette Shell, MD  EPINEPHrine (EPIPEN) 0.3 mg/0.3 mL IJ SOAJ injection Inject 0.3 mg into the muscle once as needed for anaphylaxis. 09/06/21   Hongalgi, Lenis Dickinson, MD  famotidine (PEPCID) 20 MG tablet Take 1 tablet (20 mg total) by mouth 2 (two) times daily. 10/28/19   Kennith Gain, MD  furosemide (LASIX) 40 MG tablet Take 1 tablet (40 mg total) by mouth daily. 09/07/21   Hongalgi, Lenis Dickinson, MD  latanoprost (XALATAN) 0.005 % ophthalmic solution Place 1 drop into the left eye at bedtime. 09/14/19   [provider]  leflunomide (ARAVA) 10 MG  tablet Take 10 mg by mouth every morning. 07/24/21   [provider]  methimazole (TAPAZOLE) 10 MG tablet Take 1 tablet (10 mg total) by mouth 2 (two) times daily. 07/31/21   Georgette Shell, MD  metoprolol succinate (TOPROL-XL) 100 MG 24 hr tablet Take 1.5 tablets (150 mg total) by mouth every morning. 09/07/21   Hongalgi, Lenis Dickinson, MD  Misc Natural Products (OSTEO BI-FLEX JOINT SHIELD PO) Take 1 capsule by mouth daily with supper.    [provider]  montelukast (SINGULAIR) 10 MG tablet Take 1 tablet (10 mg total) by mouth daily. Patient taking differently: Take 10 mg by mouth every evening. 12/06/19   Kennith Gain, MD  Multiple Vitamin (MULTIVITAMIN WITH MINERALS) TABS tablet Take 1 tablet by mouth every morning.    [provider]  Omega-3 Fatty Acids (FISH OIL) 1000 MG CAPS Take 1,000 mg by mouth every morning.    [provider]  predniSONE (DELTASONE) 2.5 MG tablet Take 1 tablet (2.5 mg total) by mouth daily with breakfast. 08/01/21   Georgette Shell, MD  Probiotic Product (PROBIOTIC DAILY PO) Take 1 capsule by mouth daily with supper.    [provider]  timolol (TIMOPTIC) 0.5 % ophthalmic solution Place 1 drop into both eyes 2 (two) times daily.     [provider]    Allergies  Allergen Reactions   Cozaar [Losartan] Swelling    Tongue swelling   Erythromycin Itching   Azopt [Brinzolamide] Rash    Rash on eyelid    REVIEW OF SYSTEMS:  General: no fevers/chills/night sweats Eyes: no blurry vision, diplopia, or amaurosis ENT: no sore throat or hearing loss Resp: no cough, wheezing, or hemoptysis CV: no edema or palpitations GI: no abdominal pain, nausea, vomiting, diarrhea, or constipation GU: no dysuria, frequency, or hematuria Skin: no rash Neuro: no headache, numbness, tingling, or weakness of extremities Musculoskeletal: no joint pain or swelling Heme: no bleeding, DVT, or easy bruising Endo: no polydipsia or polyuria  There were no vitals taken for this visit.  PHYSICAL EXAM: GEN:  AO x 3 in no acute distress, frail HEENT: normal Dentition: Good Neck: JVP normal. +2 carotid upstrokes without bruits. No thyromegaly. Lungs: equal expansion, clear bilaterally CV: Apex is discrete and nondisplaced, regular rate and rhythm with 3 out of 6 holosystolic murmur Abd: soft, non-tender, non-distended; no bruit; positive bowel sounds Ext: no edema, ecchymoses, or cyanosis Vascular: 2+ femoral pulses, 2+ radial pulses       Skin: warm and dry without rash Neuro: CN II-XII grossly intact; motor and sensory grossly intact    DATA AND STUDIES:  EKG: July 2023 atrial fibrillation with a controlled ventricular rate  2D ECHO: December 2023  1. Prolapse of posterior MV leaflet with severe, eccentric MR.   2. Left ventricular ejection fraction, by estimation, is 60 to 65%. The  left ventricle has normal function. The left ventricle has no regional  wall motion abnormalities. Left ventricular diastolic parameters are  indeterminate.   3. Right ventricular systolic function is normal. The right ventricular  size is normal. There is mildly elevated pulmonary artery systolic  pressure.   4. Left atrial size was  severely dilated.   5. Right atrial size was moderately dilated.   6. The mitral valve is abnormal. Severe mitral valve regurgitation. No  evidence of mitral stenosis. There is moderate late systolic prolapse of  post. leaflet of the mitral valve.   7. Tricuspid valve regurgitation is moderate  to severe.   8. The aortic valve is tricuspid. Aortic valve regurgitation is mild.  Aortic valve sclerosis/calcification is present, without any evidence of  aortic stenosis.   9. The inferior vena cava is normal in size with greater than 50%  respiratory variability, suggesting right atrial pressure of 3 mmHg.   TEE: Ejection fraction is 65 to 70% with ruptured cord in the middle of P2 with flail gap of 5 mm and flail with of 9 mm with posterior leaflet length of 1.5 cm with a valve area of 6 cm grade and mean gradient 2 mmHg  CARDIAC CATH: December 2023 1.  Normal right dominant coronary circulation. 2.  Relatively low filling pressures with low cardiac output.  The mean RA pressure was around 1 mmHg, RV pressure 20/27 with an RV end-diastolic pressure of -1 mmHg, PA pressure 24/9 with a mean of 14 mmHg, wedge pressure of 7 with V waves to 10 mmHg.  The Fick cardiac output was 2.6 L/min with a Fick cardiac index of 1.87 L/min/m consistent with low output syndrome.  STS RISK CALCULATOR: pending  NHYA CLASS: 2/3   ASSESSMENT AND PLAN:   Nonrheumatic mitral valve regurgitation  Permanent atrial fibrillation (HCC)  Rheumatoid arthritis, involving unspecified site, unspecified whether rheumatoid factor present [facility])  Frailty   Early Osmond, MD  01/17/2022 8:47 AM    [address], [place], [place]  [id number] Phone: [phone]; Fax: [phone]

## 2022-01-17 NOTE — Telephone Encounter (Signed)
Called to confirm appointment tomorrow with Dr. Lynnette Caffey. The patient's son [name]) requested to postpone the visit at least a week. He said she is still very weak and he does not think he can get her out of her home and to the office safely. He said she is confused and thinks Dr. Lynnette Caffey will do eye surgery.  Scheduled the patient for follow-up with Dr. Lynnette Caffey 12/15 to discuss MitraClip again.  [name] understands to speak with his mom over the next week about goals of care so the appointment with Dr. Lynnette Caffey can be as meaningful as possible.   Informed him about consult with Dr. Leafy Ro scheduled 12/18. He understands that appointment is tentative pending Dr. Trula Ore evaluation.  He was grateful for call and agreed with plan.

## 2022-01-18 ENCOUNTER — Ambulatory Visit (INDEPENDENT_AMBULATORY_CARE_PROVIDER_SITE_OTHER): Payer: Medicare PPO | Admitting: Internal Medicine

## 2022-01-18 DIAGNOSIS — I340 Nonrheumatic mitral (valve) insufficiency: Secondary | ICD-10-CM

## 2022-01-18 DIAGNOSIS — R54 Age-related physical debility: Secondary | ICD-10-CM

## 2022-01-18 DIAGNOSIS — I4821 Permanent atrial fibrillation: Secondary | ICD-10-CM

## 2022-01-18 DIAGNOSIS — M069 Rheumatoid arthritis, unspecified: Secondary | ICD-10-CM

## 2022-01-18 LAB — LIPOPROTEIN A (LPA): Lipoprotein (a): 73.8 nmol/L — ABNORMAL HIGH (ref <75.0)

## 2022-01-21 DIAGNOSIS — D72829 Elevated white blood cell count, unspecified: Secondary | ICD-10-CM | POA: Diagnosis not present

## 2022-01-21 DIAGNOSIS — I5033 Acute on chronic diastolic (congestive) heart failure: Secondary | ICD-10-CM | POA: Diagnosis not present

## 2022-01-21 DIAGNOSIS — E871 Hypo-osmolality and hyponatremia: Secondary | ICD-10-CM | POA: Diagnosis not present

## 2022-01-21 DIAGNOSIS — I340 Nonrheumatic mitral (valve) insufficiency: Secondary | ICD-10-CM | POA: Diagnosis not present

## 2022-01-21 DIAGNOSIS — Z681 Body mass index (BMI) 19 or less, adult: Secondary | ICD-10-CM | POA: Diagnosis not present

## 2022-01-23 ENCOUNTER — Encounter: Payer: Self-pay | Admitting: Adult Health

## 2022-01-23 NOTE — Progress Notes (Signed)
This encounter was created in error - please disregard.

## 2022-01-24 DIAGNOSIS — I4821 Permanent atrial fibrillation: Secondary | ICD-10-CM | POA: Diagnosis not present

## 2022-01-24 DIAGNOSIS — I10 Essential (primary) hypertension: Secondary | ICD-10-CM | POA: Diagnosis not present

## 2022-01-24 NOTE — Progress Notes (Signed)
Patient ID: Emily Robertson MRN: [id number] DOB/AGE: [birth year] 86 y.o.  Primary Care Physician:Miller, [name], FNP Primary Cardiologist: Kirk Ruths, MD  FOCUSED CARDIOVASCULAR PROBLEM LIST:   1.  Paroxysmal atrial fibrillation on apixaban; CV 2 score of 5 2.  Rheumatoid arthritis on Leflunomide 3.  Hypertension 4.  Osteoporosis 5.  Severe degenerative mitral regurgitation with ruptured cord involving P2; mean gradient of 2 mmHg with valve area of 6 cm 6.  Frailty   HISTORY OF PRESENT ILLNESS:  August 2023 consultation: The patient is a 88 y.o. female with the indicated medical history here for recommendations regarding her mitral regurgitation.  The patient presented to [facility] in June 2023.  At that point in time she had been admitted to [facility] long hospital after a fall and fracture.  She was noted to be in atrial fibrillation which was new for her.  She was started on a Cardizem drip.  Because of frequent falls she was not thought to be a good candidate for anticoagulation however after family discussion low-dose apixaban was started.  She was discharged and then presented again in late July with acute on chronic diastolic heart failure.  She underwent a TEE which demonstrated severe degenerative mitral regurgitation.   The patient is here with her son.  She is doing fairly well at home however she tells me that she has been tiring out more quickly than before.  Last year she used to be able to do gardening work and would walk around her neighborhood.  Now she has more problems with easy fatigability.  She denies any significant shortness of breath however.  She denies any peripheral edema, paroxysmal nocturnal dyspnea, or orthopnea.  She has had no recurrent falls and has been tolerating anticoagulation well.  Overall she feels like she is getting somewhat better but again endorses easy fatigability which has been worse than a year ago.   She sees a Pharmacist, community on a  regular basis and has good dental health.  She is walking with a walker now after her fall.  She did not use a walker before.  Plan: Patient was reluctant to undergo invasive therapy; follow-up in 3 months to discuss further.   Today: The patient was hospitalized with acute on chronic diastolic heart failure.  She was diuresed around 1.7 L medically stabilized.  I did see her in the hospital after medical stabilization.  I spoke with her and her son Emily Robertson and they have changed their mind about potentially pursuing mitral transcatheter edge-to-edge repair.  They are here to discuss this further today.  She is at an assisted care facility.  She does not do much in a day.  She fortunately has not been short of breath.  She denies any paroxysmal nocturnal dyspnea, orthopnea, or peripheral edema.  She denies any signs or symptoms of stroke or severe bleeding.  She is able to do very low levels of activity without any issues.  She does ambulate with a walker.  She is here with her son today.  Past Medical History:  Diagnosis Date   Arthritis    Glaucoma of both eyes    Hyperlipidemia    Hypertension    Pelvic prolapse    anterior floor   Urgency of urination    Urinary hesitancy    Urticaria     Past Surgical History:  Procedure Laterality Date   PUBOVAGINAL SLING N/A 08/31/2012   Procedure: BOSTON SCIENTIFIC UPHOLD LIGHT [place];  Surgeon:  Ailene Rud, MD;  [facility]  Service: Urology;  Laterality: N/A;   RETINAL DETACHMENT SURGERY Right    RIGHT/LEFT HEART CATH AND CORONARY ANGIOGRAPHY N/A 01/15/2022   Procedure: RIGHT/LEFT HEART CATH AND CORONARY ANGIOGRAPHY;  Surgeon: Early Osmond, MD;  Location: [facility];  Service: Cardiovascular;  Laterality: N/A;   TEE WITHOUT CARDIOVERSION N/A 09/05/2021   Procedure: TRANSESOPHAGEAL ECHOCARDIOGRAM (TEE);  Surgeon: Sanda Klein, MD;  Location: [facility] ENDOSCOPY;  Service: Cardiovascular;  Laterality:  N/A;    Family History  Problem Relation Age of Onset   Hypertension Mother    Thyroid disease Neg Hx    Diabetes Neg Hx     Social History   Socioeconomic History   Marital status: Widowed    Spouse name: Not on file   Number of children: Not on file   Years of education: Not on file   Highest education level: Not on file  Occupational History   Not on file  Tobacco Use   Smoking status: Never   Smokeless tobacco: Never  Vaping Use   Vaping Use: Never used  Substance and Sexual Activity   Alcohol use: Not Currently    Comment: rare   Drug use: No   Sexual activity: Not on file  Other Topics Concern   Not on file  Social History Narrative   Not on file   Social Determinants of Health   Financial Resource Strain: Not on file  Food Insecurity: No Food Insecurity (01/11/2022)   Hunger Vital Sign    Worried About Running Out of Food in the Last Year: Never true    Ran Out of Food in the Last Year: Never true  Transportation Needs: No Transportation Needs (01/11/2022)   PRAPARE - Hydrologist (Medical): No    Lack of Transportation (Non-Medical): No  Physical Activity: Not on file  Stress: Not on file  Social Connections: Not on file  Intimate Partner Violence: Not At Risk (01/11/2022)   Humiliation, Afraid, Rape, and Kick questionnaire    Fear of Current or Ex-Partner: No    Emotionally Abused: No    Physically Abused: No    Sexually Abused: No     Prior to Admission medications   Medication Sig Start Date End Date Taking? Authorizing Provider  acetaminophen (TYLENOL) 325 MG tablet Take 1 tablet (325 mg total) by mouth daily with supper. 09/06/21   Hongalgi, Lenis Dickinson, MD  alendronate (FOSAMAX) 70 MG tablet Take 70 mg by mouth every Wednesday. 08/10/14   [provider]  apixaban (ELIQUIS) 2.5 MG TABS tablet Take 1 tablet (2.5 mg total) by mouth 2 (two) times daily. 07/31/21   Georgette Shell, MD  Ascorbic Acid (VITAMIN C PO)  Take 1 tablet by mouth every morning.    [provider]  CALCIUM PO Take 1 tablet by mouth daily with supper.    [provider]  cetirizine (ZYRTEC) 10 MG tablet Take 1 tablet (10 mg total) by mouth 2 (two) times daily. 10/28/19   Kennith Gain, MD  Cholecalciferol (VITAMIN D3 PO) Take 1 capsule by mouth daily with supper.    [provider]  diltiazem (CARDIZEM CD) 180 MG 24 hr capsule Take 1 capsule (180 mg total) by mouth daily. 08/01/21   Georgette Shell, MD  EPINEPHrine (EPIPEN) 0.3 mg/0.3 mL IJ SOAJ injection Inject 0.3 mg into the muscle once as needed for anaphylaxis. 09/06/21   Hongalgi,  Maximino [place], MD  famotidine (PEPCID) 20 MG tablet Take 1 tablet (20 mg total) by mouth 2 (two) times daily. 10/28/19   Marcelyn Bruins, MD  furosemide (LASIX) 40 MG tablet Take 1 tablet (40 mg total) by mouth daily. 09/07/21   Hongalgi, Maximino [place], MD  latanoprost (XALATAN) 0.005 % ophthalmic solution Place 1 drop into the left eye at bedtime. 09/14/19   [provider]  leflunomide (ARAVA) 10 MG tablet Take 10 mg by mouth every morning. 07/24/21   [provider]  methimazole (TAPAZOLE) 10 MG tablet Take 1 tablet (10 mg total) by mouth 2 (two) times daily. 07/31/21   Alwyn Ren, MD  metoprolol succinate (TOPROL-XL) 100 MG 24 hr tablet Take 1.5 tablets (150 mg total) by mouth every morning. 09/07/21   Hongalgi, Maximino [place], MD  Misc Natural Products (OSTEO BI-FLEX JOINT SHIELD PO) Take 1 capsule by mouth daily with supper.    [provider]  montelukast (SINGULAIR) 10 MG tablet Take 1 tablet (10 mg total) by mouth daily. Patient taking differently: Take 10 mg by mouth every evening. 12/06/19   Marcelyn Bruins, MD  Multiple Vitamin (MULTIVITAMIN WITH MINERALS) TABS tablet Take 1 tablet by mouth every morning.    [provider]  Omega-3 Fatty Acids (FISH OIL) 1000 MG CAPS Take 1,000 mg by mouth every morning.     [provider]  predniSONE (DELTASONE) 2.5 MG tablet Take 1 tablet (2.5 mg total) by mouth daily with breakfast. 08/01/21   Alwyn Ren, MD  Probiotic Product (PROBIOTIC DAILY PO) Take 1 capsule by mouth daily with supper.    [provider]  timolol (TIMOPTIC) 0.5 % ophthalmic solution Place 1 drop into both eyes 2 (two) times daily.    [provider]    Allergies  Allergen Reactions   Cozaar [Losartan] Swelling    Tongue swelling   Erythromycin Itching   Azopt [Brinzolamide] Rash    Rash on eyelid    REVIEW OF SYSTEMS:  General: no fevers/chills/night sweats Eyes: no blurry vision, diplopia, or amaurosis ENT: no sore throat or hearing loss Resp: no cough, wheezing, or hemoptysis CV: no edema or palpitations GI: no abdominal pain, nausea, vomiting, diarrhea, or constipation GU: no dysuria, frequency, or hematuria Skin: no rash Neuro: no headache, numbness, tingling, or weakness of extremities Musculoskeletal: no joint pain or swelling Heme: no bleeding, DVT, or easy bruising Endo: no polydipsia or polyuria  BP 100/68   Ht 5\' 3"  (1.6 m)   Wt 97 lb (44 kg)   SpO2 92%   BMI 17.18 kg/m   PHYSICAL EXAM: GEN:  AO x 3 in no acute distress, frail HEENT: normal Dentition: Good Neck: JVP normal. +2 carotid upstrokes without bruits. No thyromegaly. Lungs: equal expansion, clear bilaterally CV: Apex is discrete and nondisplaced, regular rate and rhythm with 3 out of 6 holosystolic murmur Abd: soft, non-tender, non-distended; no bruit; positive bowel sounds Ext: no edema, ecchymoses, or cyanosis Vascular: 2+ femoral pulses, 2+ radial pulses       Skin: warm and dry without rash Neuro: CN II-XII grossly intact; motor and sensory grossly intact    DATA AND STUDIES:  EKG: July 2023 atrial fibrillation with a controlled ventricular rate   2D ECHO: December 2023  1. Prolapse of posterior MV leaflet with severe, eccentric MR.   2. Left  ventricular ejection fraction, by estimation, is 60 to 65%. The  left ventricle has normal function. The left ventricle has  no regional  wall motion abnormalities. Left ventricular diastolic parameters are  indeterminate.   3. Right ventricular systolic function is normal. The right ventricular  size is normal. There is mildly elevated pulmonary artery systolic  pressure.   4. Left atrial size was severely dilated.   5. Right atrial size was moderately dilated.   6. The mitral valve is abnormal. Severe mitral valve regurgitation. No  evidence of mitral stenosis. There is moderate late systolic prolapse of  post. leaflet of the mitral valve.   7. Tricuspid valve regurgitation is moderate to severe.   8. The aortic valve is tricuspid. Aortic valve regurgitation is mild.  Aortic valve sclerosis/calcification is present, without any evidence of  aortic stenosis.   9. The inferior vena cava is normal in size with greater than 50%  respiratory variability, suggesting right atrial pressure of 3 mmHg.    TEE: Ejection fraction is 65 to 70% with ruptured cord in the middle of P2 with flail gap of 5 mm and flail with of 9 mm with posterior leaflet length of 1.5 cm with a valve area of 6 cm grade and mean gradient 2 mmHg   CARDIAC CATH: December 2023 1.  Normal right dominant coronary circulation. 2.  Relatively low filling pressures with low cardiac output.  The mean RA pressure was around 1 mmHg, RV pressure 20/27 with an RV end-diastolic pressure of -1 mmHg, PA pressure 24/9 with a mean of 14 mmHg, wedge pressure of 7 with V waves to 10 mmHg.  The Fick cardiac output was 2.6 L/min with a Fick cardiac index of 1.87 L/min/m consistent with low output syndrome.   STS RISK CALCULATOR: pending   NHYA CLASS: 2/3    ASSESSMENT AND PLAN:   Nonrheumatic mitral valve regurgitation  Permanent atrial fibrillation (HCC)  Rheumatoid arthritis, involving unspecified site, unspecified whether  rheumatoid factor present [facility])  Had a conversation with the patient and her son.  I think given her advanced age, severe frailty, and other comorbidities I do not think that she is a candidate for mitral transcatheter edge-to-edge repair.  I do not feel like we would extend her life or actually make her feel all that much better.  I think we should treat her medically.  I did discuss advance planning with the patient and her son and she has already filed a DNR DNI.  I think this is appropriate.  They are appreciative of the visit and our discussion.  I have personally reviewed the patients imaging data as summarized above.  I have reviewed the natural history of mitral regurgitation with the patient and family members who are present today. We have discussed the limitations of medical therapy and the poor prognosis associated with symptomatic mitral regurgitation. We have also reviewed potential treatment options, including palliative medical therapy, conventional mitral surgery, and transcatheter mitral edge-to-edge repair. We discussed treatment options in the context of this patient's specific comorbid medical conditions.   All of the patient's questions were answered today. Will make further recommendations based on the results of studies outlined above.   Total time spent with patient today  60 minutes. This includes reviewing records, evaluating the patient and coordinating care.   Early Osmond, MD  01/25/2022 10:41 AM    [address], [place], [place]  [address] Phone: [phone]; Fax: [phone]

## 2022-01-25 ENCOUNTER — Encounter: Payer: Self-pay | Admitting: Internal Medicine

## 2022-01-25 ENCOUNTER — Ambulatory Visit: Payer: Medicare PPO | Attending: Internal Medicine | Admitting: Internal Medicine

## 2022-01-25 VITALS — BP 100/68 | Ht 63.0 in | Wt 97.0 lb

## 2022-01-25 DIAGNOSIS — M069 Rheumatoid arthritis, unspecified: Secondary | ICD-10-CM

## 2022-01-25 DIAGNOSIS — I340 Nonrheumatic mitral (valve) insufficiency: Secondary | ICD-10-CM

## 2022-01-25 DIAGNOSIS — I4821 Permanent atrial fibrillation: Secondary | ICD-10-CM

## 2022-01-25 NOTE — Patient Instructions (Signed)
Medication Instructions:  Your physician recommends that you continue on your current medications as directed. Please refer to the Current Medication list given to you today.  *If you need a refill on your cardiac medications before your next appointment, please call your pharmacy*   Lab Work: None. If you have labs (blood work) drawn today and your tests are completely normal, you will receive your results only by: MyChart Message (if you have MyChart) OR A paper copy in the mail If you have any lab test that is abnormal or we need to change your treatment, we will call you to review the results.   Testing/Procedures: None.   Follow-Up: Your next appointment:   As needed with Dr. Lynnette Caffey.   Important Information About Sugar

## 2022-01-28 ENCOUNTER — Encounter: Payer: Medicare PPO | Admitting: Thoracic Surgery (Cardiothoracic Vascular Surgery)

## 2022-02-07 DIAGNOSIS — M6281 Muscle weakness (generalized): Secondary | ICD-10-CM | POA: Diagnosis not present

## 2022-02-07 DIAGNOSIS — M25572 Pain in left ankle and joints of left foot: Secondary | ICD-10-CM | POA: Diagnosis not present

## 2022-02-07 DIAGNOSIS — R2689 Other abnormalities of gait and mobility: Secondary | ICD-10-CM | POA: Diagnosis not present

## 2022-02-11 DIAGNOSIS — R1312 Dysphagia, oropharyngeal phase: Secondary | ICD-10-CM | POA: Diagnosis not present

## 2022-02-11 DIAGNOSIS — M25572 Pain in left ankle and joints of left foot: Secondary | ICD-10-CM | POA: Diagnosis not present

## 2022-02-11 DIAGNOSIS — R41841 Cognitive communication deficit: Secondary | ICD-10-CM | POA: Diagnosis not present

## 2022-02-11 DIAGNOSIS — M6281 Muscle weakness (generalized): Secondary | ICD-10-CM | POA: Diagnosis not present

## 2022-02-11 DIAGNOSIS — R278 Other lack of coordination: Secondary | ICD-10-CM | POA: Diagnosis not present

## 2022-02-11 DIAGNOSIS — R2689 Other abnormalities of gait and mobility: Secondary | ICD-10-CM | POA: Diagnosis not present

## 2022-02-13 DIAGNOSIS — R1312 Dysphagia, oropharyngeal phase: Secondary | ICD-10-CM | POA: Diagnosis not present

## 2022-02-13 DIAGNOSIS — R278 Other lack of coordination: Secondary | ICD-10-CM | POA: Diagnosis not present

## 2022-02-13 DIAGNOSIS — M25572 Pain in left ankle and joints of left foot: Secondary | ICD-10-CM | POA: Diagnosis not present

## 2022-02-13 DIAGNOSIS — M6281 Muscle weakness (generalized): Secondary | ICD-10-CM | POA: Diagnosis not present

## 2022-02-13 DIAGNOSIS — R2689 Other abnormalities of gait and mobility: Secondary | ICD-10-CM | POA: Diagnosis not present

## 2022-02-13 DIAGNOSIS — R41841 Cognitive communication deficit: Secondary | ICD-10-CM | POA: Diagnosis not present

## 2022-02-15 DIAGNOSIS — M25572 Pain in left ankle and joints of left foot: Secondary | ICD-10-CM | POA: Diagnosis not present

## 2022-02-15 DIAGNOSIS — R41841 Cognitive communication deficit: Secondary | ICD-10-CM | POA: Diagnosis not present

## 2022-02-15 DIAGNOSIS — L89520 Pressure ulcer of left ankle, unstageable: Secondary | ICD-10-CM | POA: Diagnosis not present

## 2022-02-15 DIAGNOSIS — L89522 Pressure ulcer of left ankle, stage 2: Secondary | ICD-10-CM | POA: Diagnosis not present

## 2022-02-15 DIAGNOSIS — R2689 Other abnormalities of gait and mobility: Secondary | ICD-10-CM | POA: Diagnosis not present

## 2022-02-15 DIAGNOSIS — R278 Other lack of coordination: Secondary | ICD-10-CM | POA: Diagnosis not present

## 2022-02-15 DIAGNOSIS — Z0289 Encounter for other administrative examinations: Secondary | ICD-10-CM | POA: Diagnosis not present

## 2022-02-15 DIAGNOSIS — M6281 Muscle weakness (generalized): Secondary | ICD-10-CM | POA: Diagnosis not present

## 2022-02-15 DIAGNOSIS — R1312 Dysphagia, oropharyngeal phase: Secondary | ICD-10-CM | POA: Diagnosis not present

## 2022-02-18 DIAGNOSIS — R2689 Other abnormalities of gait and mobility: Secondary | ICD-10-CM | POA: Diagnosis not present

## 2022-02-18 DIAGNOSIS — M6281 Muscle weakness (generalized): Secondary | ICD-10-CM | POA: Diagnosis not present

## 2022-02-18 DIAGNOSIS — R41841 Cognitive communication deficit: Secondary | ICD-10-CM | POA: Diagnosis not present

## 2022-02-18 DIAGNOSIS — R1312 Dysphagia, oropharyngeal phase: Secondary | ICD-10-CM | POA: Diagnosis not present

## 2022-02-18 DIAGNOSIS — M25572 Pain in left ankle and joints of left foot: Secondary | ICD-10-CM | POA: Diagnosis not present

## 2022-02-18 DIAGNOSIS — R278 Other lack of coordination: Secondary | ICD-10-CM | POA: Diagnosis not present

## 2022-02-19 DIAGNOSIS — M25572 Pain in left ankle and joints of left foot: Secondary | ICD-10-CM | POA: Diagnosis not present

## 2022-02-19 DIAGNOSIS — R1312 Dysphagia, oropharyngeal phase: Secondary | ICD-10-CM | POA: Diagnosis not present

## 2022-02-19 DIAGNOSIS — R41841 Cognitive communication deficit: Secondary | ICD-10-CM | POA: Diagnosis not present

## 2022-02-19 DIAGNOSIS — R278 Other lack of coordination: Secondary | ICD-10-CM | POA: Diagnosis not present

## 2022-02-19 DIAGNOSIS — R2689 Other abnormalities of gait and mobility: Secondary | ICD-10-CM | POA: Diagnosis not present

## 2022-02-19 DIAGNOSIS — M6281 Muscle weakness (generalized): Secondary | ICD-10-CM | POA: Diagnosis not present

## 2022-02-20 DIAGNOSIS — M25572 Pain in left ankle and joints of left foot: Secondary | ICD-10-CM | POA: Diagnosis not present

## 2022-02-20 DIAGNOSIS — R2689 Other abnormalities of gait and mobility: Secondary | ICD-10-CM | POA: Diagnosis not present

## 2022-02-20 DIAGNOSIS — R278 Other lack of coordination: Secondary | ICD-10-CM | POA: Diagnosis not present

## 2022-02-20 DIAGNOSIS — R1312 Dysphagia, oropharyngeal phase: Secondary | ICD-10-CM | POA: Diagnosis not present

## 2022-02-20 DIAGNOSIS — R41841 Cognitive communication deficit: Secondary | ICD-10-CM | POA: Diagnosis not present

## 2022-02-20 DIAGNOSIS — M6281 Muscle weakness (generalized): Secondary | ICD-10-CM | POA: Diagnosis not present

## 2022-02-21 DIAGNOSIS — R1312 Dysphagia, oropharyngeal phase: Secondary | ICD-10-CM | POA: Diagnosis not present

## 2022-02-21 DIAGNOSIS — R278 Other lack of coordination: Secondary | ICD-10-CM | POA: Diagnosis not present

## 2022-02-21 DIAGNOSIS — R41841 Cognitive communication deficit: Secondary | ICD-10-CM | POA: Diagnosis not present

## 2022-02-21 DIAGNOSIS — R2689 Other abnormalities of gait and mobility: Secondary | ICD-10-CM | POA: Diagnosis not present

## 2022-02-21 DIAGNOSIS — M25572 Pain in left ankle and joints of left foot: Secondary | ICD-10-CM | POA: Diagnosis not present

## 2022-02-21 DIAGNOSIS — M6281 Muscle weakness (generalized): Secondary | ICD-10-CM | POA: Diagnosis not present

## 2022-02-25 DIAGNOSIS — M25572 Pain in left ankle and joints of left foot: Secondary | ICD-10-CM | POA: Diagnosis not present

## 2022-02-25 DIAGNOSIS — R41841 Cognitive communication deficit: Secondary | ICD-10-CM | POA: Diagnosis not present

## 2022-02-25 DIAGNOSIS — R1312 Dysphagia, oropharyngeal phase: Secondary | ICD-10-CM | POA: Diagnosis not present

## 2022-02-25 DIAGNOSIS — R278 Other lack of coordination: Secondary | ICD-10-CM | POA: Diagnosis not present

## 2022-02-25 DIAGNOSIS — M6281 Muscle weakness (generalized): Secondary | ICD-10-CM | POA: Diagnosis not present

## 2022-02-25 DIAGNOSIS — R2689 Other abnormalities of gait and mobility: Secondary | ICD-10-CM | POA: Diagnosis not present

## 2022-02-27 DIAGNOSIS — M25572 Pain in left ankle and joints of left foot: Secondary | ICD-10-CM | POA: Diagnosis not present

## 2022-02-27 DIAGNOSIS — R41841 Cognitive communication deficit: Secondary | ICD-10-CM | POA: Diagnosis not present

## 2022-02-27 DIAGNOSIS — R278 Other lack of coordination: Secondary | ICD-10-CM | POA: Diagnosis not present

## 2022-02-27 DIAGNOSIS — R2689 Other abnormalities of gait and mobility: Secondary | ICD-10-CM | POA: Diagnosis not present

## 2022-02-27 DIAGNOSIS — M6281 Muscle weakness (generalized): Secondary | ICD-10-CM | POA: Diagnosis not present

## 2022-02-27 DIAGNOSIS — R1312 Dysphagia, oropharyngeal phase: Secondary | ICD-10-CM | POA: Diagnosis not present

## 2022-02-28 DIAGNOSIS — M6281 Muscle weakness (generalized): Secondary | ICD-10-CM | POA: Diagnosis not present

## 2022-02-28 DIAGNOSIS — R41841 Cognitive communication deficit: Secondary | ICD-10-CM | POA: Diagnosis not present

## 2022-02-28 DIAGNOSIS — M25572 Pain in left ankle and joints of left foot: Secondary | ICD-10-CM | POA: Diagnosis not present

## 2022-02-28 DIAGNOSIS — R278 Other lack of coordination: Secondary | ICD-10-CM | POA: Diagnosis not present

## 2022-02-28 DIAGNOSIS — R2689 Other abnormalities of gait and mobility: Secondary | ICD-10-CM | POA: Diagnosis not present

## 2022-02-28 DIAGNOSIS — R1312 Dysphagia, oropharyngeal phase: Secondary | ICD-10-CM | POA: Diagnosis not present

## 2022-03-04 DIAGNOSIS — M6281 Muscle weakness (generalized): Secondary | ICD-10-CM | POA: Diagnosis not present

## 2022-03-04 DIAGNOSIS — R278 Other lack of coordination: Secondary | ICD-10-CM | POA: Diagnosis not present

## 2022-03-04 DIAGNOSIS — M25572 Pain in left ankle and joints of left foot: Secondary | ICD-10-CM | POA: Diagnosis not present

## 2022-03-04 DIAGNOSIS — R2689 Other abnormalities of gait and mobility: Secondary | ICD-10-CM | POA: Diagnosis not present

## 2022-03-04 DIAGNOSIS — R41841 Cognitive communication deficit: Secondary | ICD-10-CM | POA: Diagnosis not present

## 2022-03-04 DIAGNOSIS — R1312 Dysphagia, oropharyngeal phase: Secondary | ICD-10-CM | POA: Diagnosis not present

## 2022-03-05 DIAGNOSIS — M6281 Muscle weakness (generalized): Secondary | ICD-10-CM | POA: Diagnosis not present

## 2022-03-05 DIAGNOSIS — R278 Other lack of coordination: Secondary | ICD-10-CM | POA: Diagnosis not present

## 2022-03-05 DIAGNOSIS — M25572 Pain in left ankle and joints of left foot: Secondary | ICD-10-CM | POA: Diagnosis not present

## 2022-03-05 DIAGNOSIS — R2689 Other abnormalities of gait and mobility: Secondary | ICD-10-CM | POA: Diagnosis not present

## 2022-03-05 DIAGNOSIS — R1312 Dysphagia, oropharyngeal phase: Secondary | ICD-10-CM | POA: Diagnosis not present

## 2022-03-05 DIAGNOSIS — R41841 Cognitive communication deficit: Secondary | ICD-10-CM | POA: Diagnosis not present

## 2022-03-06 DIAGNOSIS — R2689 Other abnormalities of gait and mobility: Secondary | ICD-10-CM | POA: Diagnosis not present

## 2022-03-06 DIAGNOSIS — M6281 Muscle weakness (generalized): Secondary | ICD-10-CM | POA: Diagnosis not present

## 2022-03-06 DIAGNOSIS — M25572 Pain in left ankle and joints of left foot: Secondary | ICD-10-CM | POA: Diagnosis not present

## 2022-03-06 DIAGNOSIS — R41841 Cognitive communication deficit: Secondary | ICD-10-CM | POA: Diagnosis not present

## 2022-03-06 DIAGNOSIS — R1312 Dysphagia, oropharyngeal phase: Secondary | ICD-10-CM | POA: Diagnosis not present

## 2022-03-06 DIAGNOSIS — R278 Other lack of coordination: Secondary | ICD-10-CM | POA: Diagnosis not present

## 2022-03-07 DIAGNOSIS — R2689 Other abnormalities of gait and mobility: Secondary | ICD-10-CM | POA: Diagnosis not present

## 2022-03-07 DIAGNOSIS — R1312 Dysphagia, oropharyngeal phase: Secondary | ICD-10-CM | POA: Diagnosis not present

## 2022-03-07 DIAGNOSIS — R278 Other lack of coordination: Secondary | ICD-10-CM | POA: Diagnosis not present

## 2022-03-07 DIAGNOSIS — M6281 Muscle weakness (generalized): Secondary | ICD-10-CM | POA: Diagnosis not present

## 2022-03-07 DIAGNOSIS — H401113 Primary open-angle glaucoma, right eye, severe stage: Secondary | ICD-10-CM | POA: Diagnosis not present

## 2022-03-07 DIAGNOSIS — H401122 Primary open-angle glaucoma, left eye, moderate stage: Secondary | ICD-10-CM | POA: Diagnosis not present

## 2022-03-07 DIAGNOSIS — R41841 Cognitive communication deficit: Secondary | ICD-10-CM | POA: Diagnosis not present

## 2022-03-07 DIAGNOSIS — M25572 Pain in left ankle and joints of left foot: Secondary | ICD-10-CM | POA: Diagnosis not present

## 2022-03-11 DIAGNOSIS — M6281 Muscle weakness (generalized): Secondary | ICD-10-CM | POA: Diagnosis not present

## 2022-03-11 DIAGNOSIS — R278 Other lack of coordination: Secondary | ICD-10-CM | POA: Diagnosis not present

## 2022-03-11 DIAGNOSIS — R41841 Cognitive communication deficit: Secondary | ICD-10-CM | POA: Diagnosis not present

## 2022-03-11 DIAGNOSIS — M25572 Pain in left ankle and joints of left foot: Secondary | ICD-10-CM | POA: Diagnosis not present

## 2022-03-11 DIAGNOSIS — R2689 Other abnormalities of gait and mobility: Secondary | ICD-10-CM | POA: Diagnosis not present

## 2022-03-11 DIAGNOSIS — R1312 Dysphagia, oropharyngeal phase: Secondary | ICD-10-CM | POA: Diagnosis not present

## 2022-03-12 DIAGNOSIS — R2689 Other abnormalities of gait and mobility: Secondary | ICD-10-CM | POA: Diagnosis not present

## 2022-03-12 DIAGNOSIS — M6281 Muscle weakness (generalized): Secondary | ICD-10-CM | POA: Diagnosis not present

## 2022-03-12 DIAGNOSIS — R278 Other lack of coordination: Secondary | ICD-10-CM | POA: Diagnosis not present

## 2022-03-12 DIAGNOSIS — M25572 Pain in left ankle and joints of left foot: Secondary | ICD-10-CM | POA: Diagnosis not present

## 2022-03-12 DIAGNOSIS — R1312 Dysphagia, oropharyngeal phase: Secondary | ICD-10-CM | POA: Diagnosis not present

## 2022-03-12 DIAGNOSIS — R41841 Cognitive communication deficit: Secondary | ICD-10-CM | POA: Diagnosis not present

## 2022-03-13 DIAGNOSIS — R1312 Dysphagia, oropharyngeal phase: Secondary | ICD-10-CM | POA: Diagnosis not present

## 2022-03-13 DIAGNOSIS — R2689 Other abnormalities of gait and mobility: Secondary | ICD-10-CM | POA: Diagnosis not present

## 2022-03-13 DIAGNOSIS — R41841 Cognitive communication deficit: Secondary | ICD-10-CM | POA: Diagnosis not present

## 2022-03-13 DIAGNOSIS — M25572 Pain in left ankle and joints of left foot: Secondary | ICD-10-CM | POA: Diagnosis not present

## 2022-03-13 DIAGNOSIS — M6281 Muscle weakness (generalized): Secondary | ICD-10-CM | POA: Diagnosis not present

## 2022-03-13 DIAGNOSIS — R278 Other lack of coordination: Secondary | ICD-10-CM | POA: Diagnosis not present

## 2022-03-14 DIAGNOSIS — M6281 Muscle weakness (generalized): Secondary | ICD-10-CM | POA: Diagnosis not present

## 2022-03-14 DIAGNOSIS — M25572 Pain in left ankle and joints of left foot: Secondary | ICD-10-CM | POA: Diagnosis not present

## 2022-03-14 DIAGNOSIS — R2689 Other abnormalities of gait and mobility: Secondary | ICD-10-CM | POA: Diagnosis not present

## 2022-03-14 DIAGNOSIS — R278 Other lack of coordination: Secondary | ICD-10-CM | POA: Diagnosis not present

## 2022-03-14 DIAGNOSIS — R41841 Cognitive communication deficit: Secondary | ICD-10-CM | POA: Diagnosis not present

## 2022-03-14 DIAGNOSIS — R1312 Dysphagia, oropharyngeal phase: Secondary | ICD-10-CM | POA: Diagnosis not present

## 2022-03-15 DIAGNOSIS — R41841 Cognitive communication deficit: Secondary | ICD-10-CM | POA: Diagnosis not present

## 2022-03-15 DIAGNOSIS — M6281 Muscle weakness (generalized): Secondary | ICD-10-CM | POA: Diagnosis not present

## 2022-03-15 DIAGNOSIS — R278 Other lack of coordination: Secondary | ICD-10-CM | POA: Diagnosis not present

## 2022-03-15 DIAGNOSIS — R1312 Dysphagia, oropharyngeal phase: Secondary | ICD-10-CM | POA: Diagnosis not present

## 2022-03-15 DIAGNOSIS — M25572 Pain in left ankle and joints of left foot: Secondary | ICD-10-CM | POA: Diagnosis not present

## 2022-03-15 DIAGNOSIS — R2689 Other abnormalities of gait and mobility: Secondary | ICD-10-CM | POA: Diagnosis not present

## 2022-03-18 DIAGNOSIS — R41841 Cognitive communication deficit: Secondary | ICD-10-CM | POA: Diagnosis not present

## 2022-03-18 DIAGNOSIS — R1312 Dysphagia, oropharyngeal phase: Secondary | ICD-10-CM | POA: Diagnosis not present

## 2022-03-18 DIAGNOSIS — M25572 Pain in left ankle and joints of left foot: Secondary | ICD-10-CM | POA: Diagnosis not present

## 2022-03-18 DIAGNOSIS — M6281 Muscle weakness (generalized): Secondary | ICD-10-CM | POA: Diagnosis not present

## 2022-03-18 DIAGNOSIS — R2689 Other abnormalities of gait and mobility: Secondary | ICD-10-CM | POA: Diagnosis not present

## 2022-03-18 DIAGNOSIS — R278 Other lack of coordination: Secondary | ICD-10-CM | POA: Diagnosis not present

## 2022-03-19 ENCOUNTER — Encounter: Payer: Self-pay | Admitting: Nurse Practitioner

## 2022-03-19 ENCOUNTER — Non-Acute Institutional Stay: Payer: Medicare PPO | Admitting: Nurse Practitioner

## 2022-03-19 DIAGNOSIS — M810 Age-related osteoporosis without current pathological fracture: Secondary | ICD-10-CM

## 2022-03-19 DIAGNOSIS — M25572 Pain in left ankle and joints of left foot: Secondary | ICD-10-CM | POA: Diagnosis not present

## 2022-03-19 DIAGNOSIS — E785 Hyperlipidemia, unspecified: Secondary | ICD-10-CM | POA: Insufficient documentation

## 2022-03-19 DIAGNOSIS — K219 Gastro-esophageal reflux disease without esophagitis: Secondary | ICD-10-CM | POA: Diagnosis not present

## 2022-03-19 DIAGNOSIS — R278 Other lack of coordination: Secondary | ICD-10-CM | POA: Diagnosis not present

## 2022-03-19 DIAGNOSIS — E0590 Thyrotoxicosis, unspecified without thyrotoxic crisis or storm: Secondary | ICD-10-CM

## 2022-03-19 DIAGNOSIS — I480 Paroxysmal atrial fibrillation: Secondary | ICD-10-CM | POA: Diagnosis not present

## 2022-03-19 DIAGNOSIS — L89522 Pressure ulcer of left ankle, stage 2: Secondary | ICD-10-CM | POA: Insufficient documentation

## 2022-03-19 DIAGNOSIS — E871 Hypo-osmolality and hyponatremia: Secondary | ICD-10-CM

## 2022-03-19 DIAGNOSIS — N183 Chronic kidney disease, stage 3 unspecified: Secondary | ICD-10-CM | POA: Insufficient documentation

## 2022-03-19 DIAGNOSIS — M069 Rheumatoid arthritis, unspecified: Secondary | ICD-10-CM

## 2022-03-19 DIAGNOSIS — I10 Essential (primary) hypertension: Secondary | ICD-10-CM

## 2022-03-19 DIAGNOSIS — R2689 Other abnormalities of gait and mobility: Secondary | ICD-10-CM | POA: Diagnosis not present

## 2022-03-19 DIAGNOSIS — J309 Allergic rhinitis, unspecified: Secondary | ICD-10-CM | POA: Diagnosis not present

## 2022-03-19 DIAGNOSIS — I5033 Acute on chronic diastolic (congestive) heart failure: Secondary | ICD-10-CM

## 2022-03-19 DIAGNOSIS — M6281 Muscle weakness (generalized): Secondary | ICD-10-CM | POA: Diagnosis not present

## 2022-03-19 DIAGNOSIS — R41841 Cognitive communication deficit: Secondary | ICD-10-CM | POA: Diagnosis not present

## 2022-03-19 DIAGNOSIS — N1831 Chronic kidney disease, stage 3a: Secondary | ICD-10-CM | POA: Diagnosis not present

## 2022-03-19 DIAGNOSIS — R1312 Dysphagia, oropharyngeal phase: Secondary | ICD-10-CM | POA: Diagnosis not present

## 2022-03-19 NOTE — Assessment & Plan Note (Signed)
Heart rate is in control, on Eliquis, Metoprolol 

## 2022-03-19 NOTE — Assessment & Plan Note (Signed)
Na 131 01/15/22, update CMP/eGFR

## 2022-03-19 NOTE — Assessment & Plan Note (Signed)
Blood pressure is controlled,

## 2022-03-19 NOTE — Assessment & Plan Note (Signed)
Lateral left malleolus open area a quarter sized, slightly erythema at wound margin, no odorous drainage, will apply Hydrocolloid dressing q 3 days and prn, obtain ABI R+L

## 2022-03-19 NOTE — Assessment & Plan Note (Signed)
on Alendronate, Ca, Vit D, update DEXA, Vit D level

## 2022-03-19 NOTE — Assessment & Plan Note (Signed)
on Pepcid, Hgb 15.0 01/15/22

## 2022-03-19 NOTE — Assessment & Plan Note (Addendum)
Will reduce Methimazole 38m q8hrs/144mbid,  TSH 0.020 07/26/21, repeated TSH 03/26/22 24.02, repeat TSH 6 weeks.

## 2022-03-19 NOTE — Assessment & Plan Note (Signed)
takes Zyrtec, Singulair

## 2022-03-19 NOTE — Progress Notes (Addendum)
Location:   SNF [facility] Room Number: [id number] Place of Service:  ALF (13) Provider: Lennie Odor Aubrynn Katona NP  [name], FNP  Patient Care Team: [name], FNP as PCP - General (Family Medicine) Stanford Breed Denice Bors, MD as PCP - Cardiology (Cardiology) Elayne Snare, MD as Consulting Physician (Endocrinology)  Extended Emergency Contact Information Primary Emergency Contact: [facility] Address: [address]          [place], [place] [address] Johnnette Litter of [place] Phone: [phone] Relation: Son  Code Status:  DNR Goals of care: Advanced Directive information    01/23/2022   11:20 AM  Advanced Directives  Does Patient Have a Medical Advance Directive? No  Does patient want to make changes to medical advance directive? No - Patient declined     Chief Complaint  Patient presents with   Medical Management of Chronic Issues    HPI:  Pt is a 89 y.o. female seen today for medical management of chronic diseases.      CHF, MVP, 01/11/22 Echo EF 60-65%,  on Furosemide, Spironolactone  HTN, controlled,   Afib, on Eliquis, Metoprolol  RA, takes Prednisone  HLD, on Atorvastain  OP on Alendronate, Ca, Vit D  Allergic rhinitis, takes Zyrtec, Singulair  GERD, on Pepcid, Hgb 15.0 01/15/22  Hyperthyroidism, on Methimazole, TSH 0.020 07/26/21  Glaucoma, eye drops   Hyponatremia, Na 131 01/15/22  CKD, Bun/creat 38/1.0 01/15/22 Past Medical History:  Diagnosis Date   Arthritis    Glaucoma of both eyes    Hyperlipidemia    Hypertension    Pelvic prolapse    anterior floor   Urgency of urination    Urinary hesitancy    Urticaria    Past Surgical History:  Procedure Laterality Date   PUBOVAGINAL SLING N/A 08/31/2012   Procedure: [place];  Surgeon: Ailene Rud, MD;  Location: [facility];  Service: Urology;  Laterality: N/A;   RETINAL DETACHMENT SURGERY Right    RIGHT/LEFT HEART CATH AND  CORONARY ANGIOGRAPHY N/A 01/15/2022   Procedure: RIGHT/LEFT HEART CATH AND CORONARY ANGIOGRAPHY;  Surgeon: Early Osmond, MD;  Location: [facility];  Service: Cardiovascular;  Laterality: N/A;   TEE WITHOUT CARDIOVERSION N/A 09/05/2021   Procedure: TRANSESOPHAGEAL ECHOCARDIOGRAM (TEE);  Surgeon: Sanda Klein, MD;  Location: [facility] ENDOSCOPY;  Service: Cardiovascular;  Laterality: N/A;    Allergies  Allergen Reactions   Cozaar [Losartan] Swelling    Tongue swelling   Erythromycin Itching   Azopt [Brinzolamide] Rash    Rash on eyelid    Allergies as of 03/19/2022       Reactions   Cozaar [losartan] Swelling   Tongue swelling   Erythromycin Itching   Azopt [brinzolamide] Rash   Rash on eyelid        Medication List        Accurate as of March 19, 2022 11:59 PM. If you have any questions, ask your nurse or doctor.          alendronate 70 MG tablet Commonly known as: FOSAMAX Take 70 mg by mouth every Wednesday.   apixaban 2.5 MG Tabs tablet Commonly known as: ELIQUIS Take 1 tablet (2.5 mg total) by mouth 2 (two) times daily.   atorvastatin 20 MG tablet Commonly known as: LIPITOR Take 20 mg by mouth daily.   CALCIUM PO Take 1 tablet by mouth daily with supper.   cetirizine 10 MG tablet Commonly known as: ZYRTEC Take 1 tablet (  10 mg total) by mouth 2 (two) times daily.   diltiazem 180 MG 24 hr capsule Commonly known as: CARDIZEM CD Take 1 capsule (180 mg total) by mouth daily.   EPINEPHrine 0.3 mg/0.3 mL Soaj injection Commonly known as: EpiPen Inject 0.3 mg into the muscle once as needed for anaphylaxis.   famotidine 20 MG tablet Commonly known as: PEPCID Take 1 tablet (20 mg total) by mouth 2 (two) times daily.   Fish Oil 1000 MG Caps Take 1,000 mg by mouth every morning.   furosemide 40 MG tablet Commonly known as: LASIX Take 1 tablet (40 mg total) by mouth 2 (two) times daily.   lactose free nutrition Liqd Take 237 mLs by mouth 3  (three) times daily between meals.   latanoprost 0.005 % ophthalmic solution Commonly known as: XALATAN Place 1 drop into the left eye at bedtime.   leflunomide 10 MG tablet Commonly known as: ARAVA Take 10 mg by mouth every morning.   methimazole 10 MG tablet Commonly known as: TAPAZOLE Take 1 tablet (10 mg total) by mouth 2 (two) times daily.   metoprolol succinate 100 MG 24 hr tablet Commonly known as: TOPROL-XL Take 1.5 tablets (150 mg total) by mouth every morning.   montelukast 10 MG tablet Commonly known as: SINGULAIR Take 1 tablet (10 mg total) by mouth daily.   multivitamin with minerals Tabs tablet Take 1 tablet by mouth every morning.   predniSONE 2.5 MG tablet Commonly known as: DELTASONE Take 1 tablet (2.5 mg total) by mouth daily with breakfast.   PROBIOTIC DAILY PO Take 1 capsule by mouth daily with supper.   spironolactone 25 MG tablet Commonly known as: ALDACTONE Take 0.5 tablets (12.5 mg total) by mouth daily.   timolol 0.5 % ophthalmic solution Commonly known as: TIMOPTIC Place 1 drop into both eyes 2 (two) times daily.   VITAMIN C PO Take 1 tablet by mouth every morning.   VITAMIN D3 PO Take 1,000 Units by mouth daily with supper.        Review of Systems  Constitutional:  Negative for appetite change, fatigue and fever.  HENT:  Positive for hearing loss. Negative for congestion and trouble swallowing.   Eyes:  Negative for visual disturbance.  Respiratory:  Positive for shortness of breath. Negative for cough and wheezing.        DOE  Cardiovascular:  Negative for chest pain, palpitations and leg swelling.  Gastrointestinal:  Negative for abdominal pain and constipation.  Genitourinary:  Negative for dysuria and urgency.  Musculoskeletal:  Positive for arthralgias and gait problem.  Skin:  Negative for color change.  Neurological:  Negative for weakness and headaches.  Psychiatric/Behavioral:  Negative for behavioral problems and  sleep disturbance. The patient is not nervous/anxious.     Immunization History  Administered Date(s) Administered   PFIZER(Purple Top)SARS-COV-2 Vaccination 04/15/2019, 05/12/2019, 11/27/2019   Pertinent  Health Maintenance Due  Topic Date Due   INFLUENZA VACCINE  Never done   DEXA SCAN  Completed      01/14/2022    7:48 AM 01/14/2022    9:05 PM 01/15/2022    7:45 AM 01/15/2022    9:01 PM 01/16/2022   10:50 AM  Fall Risk  (RETIRED) Patient Fall Risk Level High fall risk High fall risk High fall risk High fall risk High fall risk   Functional Status Survey:    Vitals:   03/19/22 1238  BP: 130/70  Pulse: 86  Resp: 17  Temp: 97.8 F (  36.6 C)  SpO2: 96%  Weight: 99 lb 12.8 oz (45.3 kg)   Body mass index is 17.68 kg/m. Physical Exam Vitals and nursing note reviewed.  Constitutional:      Appearance: Normal appearance.  HENT:     Head: Normocephalic and atraumatic.     Nose: Nose normal.     Mouth/Throat:     Mouth: Mucous membranes are moist.  Eyes:     Extraocular Movements: Extraocular movements intact.     Conjunctiva/sclera: Conjunctivae normal.     Pupils: Pupils are equal, round, and reactive to light.  Cardiovascular:     Rate and Rhythm: Normal rate. Rhythm irregular.     Heart sounds: Murmur heard.     Comments: DP pulses not felt R+L Pulmonary:     Effort: Pulmonary effort is normal.     Breath sounds: No wheezing, rhonchi or rales.  Abdominal:     General: Bowel sounds are normal.     Palpations: Abdomen is soft.     Tenderness: There is no abdominal tenderness.  Musculoskeletal:     Cervical back: Normal range of motion and neck supple.     Right lower leg: No edema.     Left lower leg: Edema present.     Comments: Trace edema BLE  Skin:    General: Skin is warm and dry.     Findings: Erythema present.     Comments: Lateral left malleolus open area a quarter sized, slightly erythema at wound margin, no odorous drainage  Neurological:      General: No focal deficit present.     Mental Status: She is alert and oriented to person, place, and time. Mental status is at baseline.     Gait: Gait abnormal.  Psychiatric:        Mood and Affect: Mood normal.        Behavior: Behavior normal.        Thought Content: Thought content normal.     Labs reviewed: Recent Labs    01/13/22 0909 01/14/22 0149 01/15/22 0442 01/15/22 1527 01/15/22 1533 01/15/22 1538  NA 135 130* 131* 131* 117* 131*  K 3.6 3.5 3.2* 4.2 3.3* 4.3  CL 92* 92* 93*  --   --   --   CO2 33* 29 27  --   --   --   GLUCOSE 105* 115* 109*  --   --   --   BUN 36* 41* 38*  --   --   --   CREATININE 1.14* 1.10* 1.00  --   --   --   CALCIUM 8.9 8.9 9.1  --   --   --   MG 2.3 2.1 2.0  --   --   --   PHOS 2.6 2.5 2.6  --   --   --    Recent Labs    01/13/22 0909 01/14/22 0149 01/15/22 0442  AST 64* 49* 38  ALT 119* 95* 80*  ALKPHOS 194* 174* 160*  BILITOT 1.2 1.2 1.0  PROT 6.2* 6.2* 6.4*  ALBUMIN 3.6 3.4* 3.3*   Recent Labs    01/13/22 0909 01/14/22 0149 01/15/22 0442 01/15/22 1527 01/15/22 1533 01/15/22 1538  WBC 10.9* 11.6* 11.1*  --   --   --   NEUTROABS 8.1* 8.5* 7.4  --   --   --   HGB 13.2 12.8 14.5 15.3* 13.9 16.0*  HCT 40.0 38.3 43.3 45.0 41.0 47.0*  MCV 99.8 98.2 98.2  --   --   --  PLT 194 185 231  --   --   --    Lab Results  Component Value Date   TSH 10.519 (H) 01/10/2022   Lab Results  Component Value Date   HGBA1C 5.7 (H) 09/03/2021   Lab Results  Component Value Date   CHOL 144 07/26/2021   HDL 52 07/26/2021   LDLCALC 82 07/26/2021   TRIG 52 07/26/2021   CHOLHDL 2.8 07/26/2021    Significant Diagnostic Results in last 30 days:  No results found.  Assessment/Plan  Hyponatremia Na 131 01/15/22, update CMP/eGFR  CKD (chronic kidney disease) stage 3, GFR 30-59 ml/min (HCC) Bun/creat 38/1.0 01/15/22  Hyperthyroidism Will reduce Methimazole 64m q8hrs/117mbid,  TSH 0.020 07/26/21, repeated TSH 03/26/22 24.02,  repeat TSH 6 weeks.   GERD (gastroesophageal reflux disease) on Pepcid, Hgb 15.0 01/15/22  Allergic rhinitis takes Zyrtec, Singulair  Osteoporosis on Alendronate, Ca, Vit D, update DEXA, Vit D level  Hyperlipidemia  on Atorvastatin, update lipid panel.   Rheumatoid arthritis (HCC)  takes Prednisone, update CBC/diff  Paroxysmal atrial fibrillation (HCC) Heart rate is in control, on Eliquis, Metoprolol  HTN (hypertension) Blood pressure is controlled,   Acute on chronic diastolic CHF (congestive heart failure) (HCLittle AmericaCompensated clinically, MVP, 01/11/22 Echo EF 60-65%,  on Furosemide, Spironolactone  Pressure ulcer of left ankle, stage 2 (HCC) Lateral left malleolus open area a quarter sized, slightly erythema at wound margin, no odorous drainage, will apply Hydrocolloid dressing q 3 days and prn, obtain ABI R+L    Family/ staff Communication: plan of care reviewed with the patient and charge nurse.   Labs/tests ordered: CBC/diff, CMP/eGFR, TSH, lipid panel, Vit D, ABI R+L  Time spend 40 minutes.

## 2022-03-19 NOTE — Assessment & Plan Note (Signed)
on Atorvastatin, update lipid panel.

## 2022-03-19 NOTE — Assessment & Plan Note (Signed)
Bun/creat 38/1.0 01/15/22

## 2022-03-19 NOTE — Assessment & Plan Note (Addendum)
takes Prednisone, update CBC/diff

## 2022-03-19 NOTE — Assessment & Plan Note (Signed)
Compensated clinically, MVP, 01/11/22 Echo EF 60-65%,  on Furosemide, Spironolactone

## 2022-03-20 DIAGNOSIS — R1312 Dysphagia, oropharyngeal phase: Secondary | ICD-10-CM | POA: Diagnosis not present

## 2022-03-20 DIAGNOSIS — R278 Other lack of coordination: Secondary | ICD-10-CM | POA: Diagnosis not present

## 2022-03-20 DIAGNOSIS — M6281 Muscle weakness (generalized): Secondary | ICD-10-CM | POA: Diagnosis not present

## 2022-03-20 DIAGNOSIS — M25572 Pain in left ankle and joints of left foot: Secondary | ICD-10-CM | POA: Diagnosis not present

## 2022-03-20 DIAGNOSIS — R2689 Other abnormalities of gait and mobility: Secondary | ICD-10-CM | POA: Diagnosis not present

## 2022-03-20 DIAGNOSIS — R41841 Cognitive communication deficit: Secondary | ICD-10-CM | POA: Diagnosis not present

## 2022-03-21 DIAGNOSIS — M25572 Pain in left ankle and joints of left foot: Secondary | ICD-10-CM | POA: Diagnosis not present

## 2022-03-21 DIAGNOSIS — R41841 Cognitive communication deficit: Secondary | ICD-10-CM | POA: Diagnosis not present

## 2022-03-21 DIAGNOSIS — R1312 Dysphagia, oropharyngeal phase: Secondary | ICD-10-CM | POA: Diagnosis not present

## 2022-03-21 DIAGNOSIS — M6281 Muscle weakness (generalized): Secondary | ICD-10-CM | POA: Diagnosis not present

## 2022-03-21 DIAGNOSIS — R278 Other lack of coordination: Secondary | ICD-10-CM | POA: Diagnosis not present

## 2022-03-21 DIAGNOSIS — R2689 Other abnormalities of gait and mobility: Secondary | ICD-10-CM | POA: Diagnosis not present

## 2022-03-22 DIAGNOSIS — R278 Other lack of coordination: Secondary | ICD-10-CM | POA: Diagnosis not present

## 2022-03-22 DIAGNOSIS — R1312 Dysphagia, oropharyngeal phase: Secondary | ICD-10-CM | POA: Diagnosis not present

## 2022-03-22 DIAGNOSIS — R2689 Other abnormalities of gait and mobility: Secondary | ICD-10-CM | POA: Diagnosis not present

## 2022-03-22 DIAGNOSIS — I70203 Unspecified atherosclerosis of native arteries of extremities, bilateral legs: Secondary | ICD-10-CM | POA: Diagnosis not present

## 2022-03-22 DIAGNOSIS — M25572 Pain in left ankle and joints of left foot: Secondary | ICD-10-CM | POA: Diagnosis not present

## 2022-03-22 DIAGNOSIS — R41841 Cognitive communication deficit: Secondary | ICD-10-CM | POA: Diagnosis not present

## 2022-03-22 DIAGNOSIS — M6281 Muscle weakness (generalized): Secondary | ICD-10-CM | POA: Diagnosis not present

## 2022-03-25 DIAGNOSIS — R278 Other lack of coordination: Secondary | ICD-10-CM | POA: Diagnosis not present

## 2022-03-25 DIAGNOSIS — M6281 Muscle weakness (generalized): Secondary | ICD-10-CM | POA: Diagnosis not present

## 2022-03-25 DIAGNOSIS — R41841 Cognitive communication deficit: Secondary | ICD-10-CM | POA: Diagnosis not present

## 2022-03-25 DIAGNOSIS — R1312 Dysphagia, oropharyngeal phase: Secondary | ICD-10-CM | POA: Diagnosis not present

## 2022-03-25 DIAGNOSIS — R2689 Other abnormalities of gait and mobility: Secondary | ICD-10-CM | POA: Diagnosis not present

## 2022-03-25 DIAGNOSIS — M25572 Pain in left ankle and joints of left foot: Secondary | ICD-10-CM | POA: Diagnosis not present

## 2022-03-26 ENCOUNTER — Encounter: Payer: Self-pay | Admitting: Nurse Practitioner

## 2022-03-26 ENCOUNTER — Non-Acute Institutional Stay: Payer: Medicare PPO | Admitting: Family Medicine

## 2022-03-26 DIAGNOSIS — I4891 Unspecified atrial fibrillation: Secondary | ICD-10-CM

## 2022-03-26 DIAGNOSIS — E0590 Thyrotoxicosis, unspecified without thyrotoxic crisis or storm: Secondary | ICD-10-CM

## 2022-03-26 DIAGNOSIS — N1831 Chronic kidney disease, stage 3a: Secondary | ICD-10-CM

## 2022-03-26 DIAGNOSIS — M6281 Muscle weakness (generalized): Secondary | ICD-10-CM | POA: Diagnosis not present

## 2022-03-26 DIAGNOSIS — E785 Hyperlipidemia, unspecified: Secondary | ICD-10-CM

## 2022-03-26 DIAGNOSIS — I10 Essential (primary) hypertension: Secondary | ICD-10-CM | POA: Diagnosis not present

## 2022-03-26 DIAGNOSIS — I5031 Acute diastolic (congestive) heart failure: Secondary | ICD-10-CM | POA: Diagnosis not present

## 2022-03-26 DIAGNOSIS — R2689 Other abnormalities of gait and mobility: Secondary | ICD-10-CM | POA: Diagnosis not present

## 2022-03-26 DIAGNOSIS — M25572 Pain in left ankle and joints of left foot: Secondary | ICD-10-CM | POA: Diagnosis not present

## 2022-03-26 DIAGNOSIS — I739 Peripheral vascular disease, unspecified: Secondary | ICD-10-CM | POA: Insufficient documentation

## 2022-03-26 DIAGNOSIS — I4821 Permanent atrial fibrillation: Secondary | ICD-10-CM | POA: Diagnosis not present

## 2022-03-26 DIAGNOSIS — R278 Other lack of coordination: Secondary | ICD-10-CM | POA: Diagnosis not present

## 2022-03-26 DIAGNOSIS — M810 Age-related osteoporosis without current pathological fracture: Secondary | ICD-10-CM

## 2022-03-26 DIAGNOSIS — R41841 Cognitive communication deficit: Secondary | ICD-10-CM | POA: Diagnosis not present

## 2022-03-26 DIAGNOSIS — R1312 Dysphagia, oropharyngeal phase: Secondary | ICD-10-CM | POA: Diagnosis not present

## 2022-03-26 NOTE — Progress Notes (Signed)
Provider:  Alain Honey, MD Location:      Place of Service:     PCP: [name], FNP Patient Care Team: [name], FNP as PCP - General (Family Medicine) Stanford Breed Denice Bors, MD as PCP - Cardiology (Cardiology) Elayne Snare, MD as Consulting Physician (Endocrinology)  Extended Emergency Contact Information Primary Emergency Contact: [facility] Address: [address]          [place], [place] [address] Johnnette Litter of [place] Phone: [phone] Relation: Son  Code Status:  Goals of Care: Advanced Directive information    01/23/2022   11:20 AM  Advanced Directives  Does Patient Have a Medical Advance Directive? No  Does patient want to make changes to medical advance directive? No - Patient declined      No chief complaint on file.   HPI: Patient is a 89 y.o. female seen today for medical management of chronic problems including arthritis, hyperlipidemia, hypertension, atrial fibrillation, osteoporosis. I have visited her in her room where she was just finishing breakfast.  Forde Dandy is appropriate.  She seemed to get some fax confused as she was complaining about some pain in her left ankle which she thought was related to a strain but in reviewing her medical history there is a wound that is healing on her ankle.  She denies any chest pain or shortness of breath today.  She was hospitalized back in December with acute on chronic congestive heart failure thought to be secondary to mitral valve disease.  At that time they were considering some sort of valvular procedure but I am not sure that that ever happened.  She does have a history of A-fib.  For congestive failure she is on twice a day doses of furosemide as well as spironolactone . She denies problems with sleep, appetite,.  She uses her walker for ambulation and denies any recent falls. Past Medical History:  Diagnosis Date   Arthritis    Glaucoma of both eyes    Hyperlipidemia     Hypertension    Pelvic prolapse    anterior floor   Urgency of urination    Urinary hesitancy    Urticaria    Past Surgical History:  Procedure Laterality Date   PUBOVAGINAL SLING N/A 08/31/2012   Procedure: BOSTON SCIENTIFIC UPHOLD LIGHT Louise;  Surgeon: Ailene Rud, MD;  Location: [facility];  Service: Urology;  Laterality: N/A;   RETINAL DETACHMENT SURGERY Right    RIGHT/LEFT HEART CATH AND CORONARY ANGIOGRAPHY N/A 01/15/2022   Procedure: RIGHT/LEFT HEART CATH AND CORONARY ANGIOGRAPHY;  Surgeon: Early Osmond, MD;  Location: [facility];  Service: Cardiovascular;  Laterality: N/A;   TEE WITHOUT CARDIOVERSION N/A 09/05/2021   Procedure: TRANSESOPHAGEAL ECHOCARDIOGRAM (TEE);  Surgeon: Sanda Klein, MD;  Location: [facility] ENDOSCOPY;  Service: Cardiovascular;  Laterality: N/A;    reports that she has never smoked. She has never used smokeless tobacco. She reports that she does not currently use alcohol. She reports that she does not use drugs. Social History   Socioeconomic History   Marital status: Widowed    Spouse name: Not on file   Number of children: Not on file   Years of education: Not on file   Highest education level: Not on file  Occupational History   Not on file  Tobacco Use   Smoking status: Never   Smokeless tobacco: Never  Vaping Use   Vaping Use: Never used  Substance and Sexual Activity   Alcohol  use: Not Currently    Comment: rare   Drug use: No   Sexual activity: Not on file  Other Topics Concern   Not on file  Social History Narrative   Not on file   Social Determinants of Health   Financial Resource Strain: Not on file  Food Insecurity: No Food Insecurity (01/11/2022)   Hunger Vital Sign    Worried About Running Out of Food in the Last Year: Never true    Ran Out of Food in the Last Year: Never true  Transportation Needs: No Transportation Needs (01/11/2022)   PRAPARE - Radiographer, therapeutic (Medical): No    Lack of Transportation (Non-Medical): No  Physical Activity: Not on file  Stress: Not on file  Social Connections: Not on file  Intimate Partner Violence: Not At Risk (01/11/2022)   Humiliation, Afraid, Rape, and Kick questionnaire    Fear of Current or Ex-Partner: No    Emotionally Abused: No    Physically Abused: No    Sexually Abused: No    Functional Status Survey:    Family History  Problem Relation Age of Onset   Hypertension Mother    Thyroid disease Neg Hx    Diabetes Neg Hx     Health Maintenance  Topic Date Due   DTaP/Tdap/Td (1 - Tdap) Never done   Zoster Vaccines- Shingrix (1 of 2) Never done   Pneumonia Vaccine 16+ Years old (1 of 1 - PCV) Never done   DEXA SCAN  Never done   INFLUENZA VACCINE  Never done   COVID-19 Vaccine (4 - 2023-24 season) 10/12/2021   HPV VACCINES  Aged Out    Allergies  Allergen Reactions   Cozaar [Losartan] Swelling    Tongue swelling   Erythromycin Itching   Azopt [Brinzolamide] Rash    Rash on eyelid    Outpatient Encounter Medications as of 03/26/2022  Medication Sig   alendronate (FOSAMAX) 70 MG tablet Take 70 mg by mouth every Wednesday.   apixaban (ELIQUIS) 2.5 MG TABS tablet Take 1 tablet (2.5 mg total) by mouth 2 (two) times daily.   Ascorbic Acid (VITAMIN C PO) Take 1 tablet by mouth every morning.   atorvastatin (LIPITOR) 20 MG tablet Take 20 mg by mouth daily.   CALCIUM PO Take 1 tablet by mouth daily with supper.   cetirizine (ZYRTEC) 10 MG tablet Take 1 tablet (10 mg total) by mouth 2 (two) times daily.   Cholecalciferol (VITAMIN D3 PO) Take 1,000 Units by mouth daily with supper.   diltiazem (CARDIZEM CD) 180 MG 24 hr capsule Take 1 capsule (180 mg total) by mouth daily.   EPINEPHrine (EPIPEN) 0.3 mg/0.3 mL IJ SOAJ injection Inject 0.3 mg into the muscle once as needed for anaphylaxis.   famotidine (PEPCID) 20 MG tablet Take 1 tablet (20 mg total) by mouth 2 (two) times daily.    furosemide (LASIX) 40 MG tablet Take 1 tablet (40 mg total) by mouth 2 (two) times daily.   lactose free nutrition (BOOST) LIQD Take 237 mLs by mouth 3 (three) times daily between meals.   latanoprost (XALATAN) 0.005 % ophthalmic solution Place 1 drop into the left eye at bedtime.   leflunomide (ARAVA) 10 MG tablet Take 10 mg by mouth every morning.   methimazole (TAPAZOLE) 10 MG tablet Take 1 tablet (10 mg total) by mouth 2 (two) times daily.   metoprolol succinate (TOPROL-XL) 100 MG 24 hr tablet Take 1.5 tablets (150 mg total)  by mouth every morning.   montelukast (SINGULAIR) 10 MG tablet Take 1 tablet (10 mg total) by mouth daily.   Multiple Vitamin (MULTIVITAMIN WITH MINERALS) TABS tablet Take 1 tablet by mouth every morning.   Omega-3 Fatty Acids (FISH OIL) 1000 MG CAPS Take 1,000 mg by mouth every morning.   predniSONE (DELTASONE) 2.5 MG tablet Take 1 tablet (2.5 mg total) by mouth daily with breakfast.   Probiotic Product (PROBIOTIC DAILY PO) Take 1 capsule by mouth daily with supper.   spironolactone (ALDACTONE) 25 MG tablet Take 0.5 tablets (12.5 mg total) by mouth daily.   timolol (TIMOPTIC) 0.5 % ophthalmic solution Place 1 drop into both eyes 2 (two) times daily.   No facility-administered encounter medications on file as of 03/26/2022.    Review of Systems  Constitutional: Negative.   HENT: Negative.    Respiratory: Negative.    Cardiovascular: Negative.   Gastrointestinal: Negative.   Genitourinary: Negative.   Musculoskeletal:  Positive for arthralgias and gait problem.  All other systems reviewed and are negative.   There were no vitals filed for this visit. There is no height or weight on file to calculate BMI. Physical Exam Vitals and nursing note reviewed.  Constitutional:      Appearance: Normal appearance.  Cardiovascular:     Rate and Rhythm: Normal rate and regular rhythm.     Heart sounds: Murmur heard.  Pulmonary:     Effort: Pulmonary effort is normal.      Breath sounds: Normal breath sounds.  Abdominal:     General: Bowel sounds are normal.  Musculoskeletal:     Comments: Uses walker for ambulation.  There is tenderness around the lateral malleolus  Neurological:     General: No focal deficit present.     Mental Status: She is alert and oriented to person, place, and time.  Psychiatric:        Behavior: Behavior normal.     Labs reviewed: Basic Metabolic Panel: Recent Labs    01/13/22 0909 01/14/22 0149 01/15/22 0442 01/15/22 1527 01/15/22 1533 01/15/22 1538  NA 135 130* 131* 131* 117* 131*  K 3.6 3.5 3.2* 4.2 3.3* 4.3  CL 92* 92* 93*  --   --   --   CO2 33* 29 27  --   --   --   GLUCOSE 105* 115* 109*  --   --   --   BUN 36* 41* 38*  --   --   --   CREATININE 1.14* 1.10* 1.00  --   --   --   CALCIUM 8.9 8.9 9.1  --   --   --   MG 2.3 2.1 2.0  --   --   --   PHOS 2.6 2.5 2.6  --   --   --    Liver Function Tests: Recent Labs    01/13/22 0909 01/14/22 0149 01/15/22 0442  AST 64* 49* 38  ALT 119* 95* 80*  ALKPHOS 194* 174* 160*  BILITOT 1.2 1.2 1.0  PROT 6.2* 6.2* 6.4*  ALBUMIN 3.6 3.4* 3.3*   No results for input(s): "LIPASE", "AMYLASE" in the last 8760 hours. Recent Labs    01/11/22 0656  AMMONIA 17   CBC: Recent Labs    01/13/22 0909 01/14/22 0149 01/15/22 0442 01/15/22 1527 01/15/22 1533 01/15/22 1538  WBC 10.9* 11.6* 11.1*  --   --   --   NEUTROABS 8.1* 8.5* 7.4  --   --   --  HGB 13.2 12.8 14.5 15.3* 13.9 16.0*  HCT 40.0 38.3 43.3 45.0 41.0 47.0*  MCV 99.8 98.2 98.2  --   --   --   PLT 194 185 231  --   --   --    Cardiac Enzymes: No results for input(s): "CKTOTAL", "CKMB", "CKMBINDEX", "TROPONINI" in the last 8760 hours. BNP: Invalid input(s): "POCBNP" Lab Results  Component Value Date   HGBA1C 5.7 (H) 09/03/2021   Lab Results  Component Value Date   TSH 10.519 (H) 01/10/2022   Lab Results  Component Value Date   VITAMINB12 2,870 (H) 01/11/2022   Lab Results  Component  Value Date   FOLATE 33.5 01/11/2022   No results found for: "IRON", "TIBC", "FERRITIN"  Imaging and Procedures obtained prior to SNF admission: DG CHEST PORT 1 VIEW  Result Date: 01/11/2022 CLINICAL DATA:  Hypoxemia EXAM: PORTABLE CHEST 1 VIEW COMPARISON:  Chest x-ray 01/10/2022.  CT of the chest 11 20. FINDINGS: Small pleural effusions persist. The heart is enlarged. There central pulmonary vascular congestion. There is no lung consolidation or pneumothorax. No acute fractures are seen. IMPRESSION: Cardiomegaly with central pulmonary vascular congestion and small bilateral pleural effusions. Electronically Signed   By: Ronney Asters M.D.   On: 01/11/2022 18:51   ECHOCARDIOGRAM COMPLETE  Result Date: 01/11/2022    ECHOCARDIOGRAM REPORT   Patient Name:   ANNALEE SEMO Date of Exam: 01/11/2022 Medical Rec #:  [id number]         Height:       63.0 in Accession #:    [id number]        Weight:       101.0 lb Date of Birth:  1930/03/30         BSA:          1.446 m Patient Age:    53 years          BP:           107/95 mmHg Patient Gender: F                 HR:           88 bpm. Exam Location:  Inpatient Procedure: 2D Echo Indications:    CHF  History:        Patient has prior history of Echocardiogram examinations, most                 recent 07/26/2021. CHF, Mitral Valve Prolapse, Arrythmias:Atrial                 Fibrillation; Risk Factors:Hypertension.  Sonographer:    Harvie Junior Referring Phys: [id number] [facility]  Sonographer Comments: Restraints. IMPRESSIONS  1. Prolapse of posterior MV leaflet with severe, eccentric MR.  2. Left ventricular ejection fraction, by estimation, is 60 to 65%. The left ventricle has normal function. The left ventricle has no regional wall motion abnormalities. Left ventricular diastolic parameters are indeterminate.  3. Right ventricular systolic function is normal. The right ventricular size is normal. There is mildly elevated pulmonary artery systolic pressure.  4.  Left atrial size was severely dilated.  5. Right atrial size was moderately dilated.  6. The mitral valve is abnormal. Severe mitral valve regurgitation. No evidence of mitral stenosis. There is moderate late systolic prolapse of post. leaflet of the mitral valve.  7. Tricuspid valve regurgitation is moderate to severe.  8. The aortic valve is tricuspid. Aortic valve regurgitation is mild. Aortic valve sclerosis/calcification is present,  without any evidence of aortic stenosis.  9. The inferior vena cava is normal in size with greater than 50% respiratory variability, suggesting right atrial pressure of 3 mmHg. FINDINGS  Left Ventricle: Left ventricular ejection fraction, by estimation, is 60 to 65%. The left ventricle has normal function. The left ventricle has no regional wall motion abnormalities. The left ventricular internal cavity size was normal in size. There is  no left ventricular hypertrophy. Left ventricular diastolic parameters are indeterminate. Right Ventricle: The right ventricular size is normal. Right ventricular systolic function is normal. There is mildly elevated pulmonary artery systolic pressure. The tricuspid regurgitant velocity is 2.97 m/s, and with an assumed right atrial pressure of 3 mmHg, the estimated right ventricular systolic pressure is 0000000 mmHg. Left Atrium: Left atrial size was severely dilated. Right Atrium: Right atrial size was moderately dilated. Pericardium: There is no evidence of pericardial effusion. Mitral Valve: The mitral valve is abnormal. There is moderate late systolic prolapse of post. leaflet of the mitral valve. Mild mitral annular calcification. Severe mitral valve regurgitation. No evidence of mitral valve stenosis. Tricuspid Valve: The tricuspid valve is normal in structure. Tricuspid valve regurgitation is moderate to severe. No evidence of tricuspid stenosis. Aortic Valve: The aortic valve is tricuspid. Aortic valve regurgitation is mild. Aortic  regurgitation PHT measures 819 msec. Aortic valve sclerosis/calcification is present, without any evidence of aortic stenosis. Aortic valve mean gradient measures 2.0  mmHg. Aortic valve peak gradient measures 3.5 mmHg. Aortic valve area, by VTI measures 2.24 cm. Pulmonic Valve: The pulmonic valve was normal in structure. Pulmonic valve regurgitation is trivial. No evidence of pulmonic stenosis. Aorta: The aortic root is normal in size and structure. Venous: The inferior vena cava is normal in size with greater than 50% respiratory variability, suggesting right atrial pressure of 3 mmHg. IAS/Shunts: No atrial level shunt detected by color flow Doppler. Additional Comments: Prolapse of posterior MV leaflet with severe, eccentric MR.  LEFT VENTRICLE PLAX 2D LVIDd:         4.00 cm LVIDs:         2.90 cm LV PW:         1.10 cm LV IVS:        1.10 cm LVOT diam:     2.00 cm LV SV:         28 LV SV Index:   20 LVOT Area:     3.14 cm  LV Volumes (MOD) LV vol d, MOD A2C: 57.6 ml LV vol d, MOD A4C: 44.8 ml LV vol s, MOD A2C: 25.5 ml LV vol s, MOD A4C: 21.2 ml LV SV MOD A2C:     32.1 ml LV SV MOD A4C:     44.8 ml LV SV MOD BP:      31.1 ml RIGHT VENTRICLE RV Basal diam:  4.40 cm RV Mid diam:    3.80 cm RV S prime:     7.18 cm/s TAPSE (M-mode): 1.6 cm LEFT ATRIUM              Index        RIGHT ATRIUM           Index LA diam:        2.80 cm  1.94 cm/m   RA Area:     20.70 cm LA Vol (A2C):   101.0 ml 69.83 ml/m  RA Volume:   56.60 ml  39.13 ml/m LA Vol (A4C):   83.6 ml  57.80 ml/m LA Biplane Vol:  96.5 ml  66.72 ml/m  AORTIC VALVE                    PULMONIC VALVE AV Area (Vmax):    1.72 cm     PV Vmax:       0.56 m/s AV Area (Vmean):   1.79 cm     PV Peak grad:  1.2 mmHg AV Area (VTI):     2.24 cm AV Vmax:           93.30 cm/s AV Vmean:          57.200 cm/s AV VTI:            0.126 m AV Peak Grad:      3.5 mmHg AV Mean Grad:      2.0 mmHg LVOT Vmax:         51.00 cm/s LVOT Vmean:        32.600 cm/s LVOT VTI:           0.090 m LVOT/AV VTI ratio: 0.71 AI PHT:            819 msec  AORTA Ao Root diam: 3.30 cm Ao Asc diam:  3.30 cm MITRAL VALVE                  TRICUSPID VALVE MV Area (PHT): 4.36 cm       TR Peak grad:   35.3 mmHg MV Decel Time: 174 msec       TR Vmax:        297.00 cm/s MR Peak grad:    75.9 mmHg MR Mean grad:    57.0 mmHg    SHUNTS MR Vmax:         435.50 cm/s  Systemic VTI:  0.09 m MR Vmean:        355.0 cm/s   Systemic Diam: 2.00 cm MR PISA:         6.28 cm MR PISA Eff ROA: 33 mm MR PISA Radius:  1.00 cm MV E velocity: 93.80 cm/s MV A velocity: 46.30 cm/s MV E/A ratio:  2.03 Kirk Ruths MD Electronically signed by Kirk Ruths MD Signature Date/Time: 01/11/2022/4:23:51 PM    Final    CT Chest W Contrast  Result Date: 01/10/2022 CLINICAL DATA:  Altered level of consciousness, fatigue, short of breath, hypoxia EXAM: CT CHEST WITH CONTRAST TECHNIQUE: Multidetector CT imaging of the chest was performed during intravenous contrast administration. RADIATION DOSE REDUCTION: This exam was performed according to the departmental dose-optimization program which includes automated exposure control, adjustment of the mA and/or kV according to patient size and/or use of iterative reconstruction technique. CONTRAST:  39m OMNIPAQUE IOHEXOL 300 MG/ML  SOLN COMPARISON:  01/10/2022, 09/03/2021, 01/12/2021 FINDINGS: Cardiovascular: The heart is enlarged with prominent biatrial dilatation. No pericardial effusion. No pulmonary emboli. No evidence of thoracic aortic aneurysm or dissection. Atherosclerosis of the aorta. Mediastinum/Nodes: No enlarged mediastinal, hilar, or axillary lymph nodes. Thyroid gland, trachea, and esophagus demonstrate no significant findings. Lungs/Pleura: There are persistent bilateral pleural effusions less than 1 L each. Ground-glass nodularity at the right apex measuring up to 1.5 cm is unchanged since prior exam, reference image 17/6. No new airspace disease. No pneumothorax. Stable  background emphysema. Central airways are patent. Upper Abdomen: Reflux of contrast into the hepatic veins suggesting cardiac dysfunction. No acute upper abdominal findings. Musculoskeletal: No acute or destructive bony lesions. Reconstructed images demonstrate no additional findings. IMPRESSION: 1. Stable bilateral pleural effusions. 2. Stable ground-glass nodularity  at the right apex, unchanged since 01/12/2021. Follow-up CT is recommended every 2 years until 5 years of stability has been established. This recommendation follows the consensus statement: Guidelines for Management of Incidental Pulmonary Nodules Detected on CT Images: From the Fleischner Society 2017; Radiology 2017; 284:228-243. 3. No evidence of pulmonary embolus. 4. Cardiomegaly with prominent biatrial dilatation. 5.  Aortic Atherosclerosis (ICD10-I70.0). Electronically Signed   By: Randa Ngo M.D.   On: 01/10/2022 22:24   CT HEAD WO CONTRAST (5MM)  Result Date: 01/10/2022 CLINICAL DATA:  Mental status change, unknown cause EXAM: CT HEAD WITHOUT CONTRAST TECHNIQUE: Contiguous axial images were obtained from the base of the skull through the vertex without intravenous contrast. RADIATION DOSE REDUCTION: This exam was performed according to the departmental dose-optimization program which includes automated exposure control, adjustment of the mA and/or kV according to patient size and/or use of iterative reconstruction technique. COMPARISON:  07/25/2021 FINDINGS: Brain: There is periventricular white matter decreased attenuation consistent with small vessel ischemic changes. Ventricles, sulci and cisterns are prominent consistent with age related involutional changes. No acute intracranial hemorrhage, mass effect or shift. No hydrocephalus. Encephalomalacia identified consistent with chronic bilateral basal ganglia lacunar CVAs. Vascular: No hyperdense vessel or unexpected calcification. Skull: Normal. Negative for fracture or focal  lesion. Sinuses/Orbits: No acute finding. IMPRESSION: Atrophy and chronic small vessel ischemic changes. Chronic lacunar CVAs bilateral basal ganglia. No acute intracranial process identified. Electronically Signed   By: Sammie Bench M.D.   On: 01/10/2022 21:42   DG Chest Port 1 View  Result Date: 01/10/2022 CLINICAL DATA:  Short of breath EXAM: PORTABLE CHEST 1 VIEW COMPARISON:  09/03/2021 FINDINGS: Single frontal view of the chest demonstrates persistent enlargement of the cardiac silhouette. No airspace disease, effusion, or pneumothorax. Chronic central vascular prominence. Stable nodular density at the right apex, please see previous CT discussion. No acute bony abnormalities. IMPRESSION: 1. Chronic central vascular congestion.  No acute airspace disease. 2. Stable nodularity at the right apex, please see previous CT chest discussion. Electronically Signed   By: Randa Ngo M.D.   On: 01/10/2022 20:20    Assessment/Plan 1. Acute diastolic congestive heart failure ([place]) Is well compensated at this time there is no edema breathing is easy  2. Atrial fibrillation with RVR (HCC) Seems to be in sinus rhythm today rate is appropriately controlled  3. Stage 3a chronic kidney disease (HCC) BUN and creatinine as of 2 months ago was 38 and 1.0 respectively  4. Hyperlipidemia, unspecified hyperlipidemia type She continues on atorvastatin  5. Hyperthyroidism Takes Tapazole; thyroid functions are within normal limits  6. Osteoporosis without current pathological fracture, unspecified osteoporosis type Continues on Fosamax weekly along with calcium and vitamin D.  She tells me she had bone density yesterday.  Results are not available    Family/ staff Communication:   Labs/tests ordered: none  Lillette Boxer. Sabra Heck, [place] [address] [place], [place] Office [phone]

## 2022-03-27 DIAGNOSIS — R41841 Cognitive communication deficit: Secondary | ICD-10-CM | POA: Diagnosis not present

## 2022-03-27 DIAGNOSIS — R278 Other lack of coordination: Secondary | ICD-10-CM | POA: Diagnosis not present

## 2022-03-27 DIAGNOSIS — R1312 Dysphagia, oropharyngeal phase: Secondary | ICD-10-CM | POA: Diagnosis not present

## 2022-03-27 DIAGNOSIS — M6281 Muscle weakness (generalized): Secondary | ICD-10-CM | POA: Diagnosis not present

## 2022-03-27 DIAGNOSIS — R2689 Other abnormalities of gait and mobility: Secondary | ICD-10-CM | POA: Diagnosis not present

## 2022-03-27 DIAGNOSIS — M25572 Pain in left ankle and joints of left foot: Secondary | ICD-10-CM | POA: Diagnosis not present

## 2022-03-28 DIAGNOSIS — R1312 Dysphagia, oropharyngeal phase: Secondary | ICD-10-CM | POA: Diagnosis not present

## 2022-03-28 DIAGNOSIS — M6281 Muscle weakness (generalized): Secondary | ICD-10-CM | POA: Diagnosis not present

## 2022-03-28 DIAGNOSIS — R41841 Cognitive communication deficit: Secondary | ICD-10-CM | POA: Diagnosis not present

## 2022-03-28 DIAGNOSIS — R278 Other lack of coordination: Secondary | ICD-10-CM | POA: Diagnosis not present

## 2022-03-28 DIAGNOSIS — M25572 Pain in left ankle and joints of left foot: Secondary | ICD-10-CM | POA: Diagnosis not present

## 2022-03-28 DIAGNOSIS — R2689 Other abnormalities of gait and mobility: Secondary | ICD-10-CM | POA: Diagnosis not present

## 2022-04-01 DIAGNOSIS — M25572 Pain in left ankle and joints of left foot: Secondary | ICD-10-CM | POA: Diagnosis not present

## 2022-04-01 DIAGNOSIS — R278 Other lack of coordination: Secondary | ICD-10-CM | POA: Diagnosis not present

## 2022-04-01 DIAGNOSIS — R2689 Other abnormalities of gait and mobility: Secondary | ICD-10-CM | POA: Diagnosis not present

## 2022-04-01 DIAGNOSIS — R41841 Cognitive communication deficit: Secondary | ICD-10-CM | POA: Diagnosis not present

## 2022-04-01 DIAGNOSIS — M6281 Muscle weakness (generalized): Secondary | ICD-10-CM | POA: Diagnosis not present

## 2022-04-01 DIAGNOSIS — R1312 Dysphagia, oropharyngeal phase: Secondary | ICD-10-CM | POA: Diagnosis not present

## 2022-04-02 DIAGNOSIS — R41841 Cognitive communication deficit: Secondary | ICD-10-CM | POA: Diagnosis not present

## 2022-04-02 DIAGNOSIS — M25572 Pain in left ankle and joints of left foot: Secondary | ICD-10-CM | POA: Diagnosis not present

## 2022-04-02 DIAGNOSIS — M6281 Muscle weakness (generalized): Secondary | ICD-10-CM | POA: Diagnosis not present

## 2022-04-02 DIAGNOSIS — R2689 Other abnormalities of gait and mobility: Secondary | ICD-10-CM | POA: Diagnosis not present

## 2022-04-02 DIAGNOSIS — R1312 Dysphagia, oropharyngeal phase: Secondary | ICD-10-CM | POA: Diagnosis not present

## 2022-04-02 DIAGNOSIS — R278 Other lack of coordination: Secondary | ICD-10-CM | POA: Diagnosis not present

## 2022-04-03 DIAGNOSIS — R41841 Cognitive communication deficit: Secondary | ICD-10-CM | POA: Diagnosis not present

## 2022-04-03 DIAGNOSIS — R2689 Other abnormalities of gait and mobility: Secondary | ICD-10-CM | POA: Diagnosis not present

## 2022-04-03 DIAGNOSIS — M6281 Muscle weakness (generalized): Secondary | ICD-10-CM | POA: Diagnosis not present

## 2022-04-03 DIAGNOSIS — R1312 Dysphagia, oropharyngeal phase: Secondary | ICD-10-CM | POA: Diagnosis not present

## 2022-04-03 DIAGNOSIS — R278 Other lack of coordination: Secondary | ICD-10-CM | POA: Diagnosis not present

## 2022-04-03 DIAGNOSIS — M25572 Pain in left ankle and joints of left foot: Secondary | ICD-10-CM | POA: Diagnosis not present

## 2022-04-04 DIAGNOSIS — R2689 Other abnormalities of gait and mobility: Secondary | ICD-10-CM | POA: Diagnosis not present

## 2022-04-04 DIAGNOSIS — R41841 Cognitive communication deficit: Secondary | ICD-10-CM | POA: Diagnosis not present

## 2022-04-04 DIAGNOSIS — R1312 Dysphagia, oropharyngeal phase: Secondary | ICD-10-CM | POA: Diagnosis not present

## 2022-04-04 DIAGNOSIS — M25572 Pain in left ankle and joints of left foot: Secondary | ICD-10-CM | POA: Diagnosis not present

## 2022-04-04 DIAGNOSIS — M6281 Muscle weakness (generalized): Secondary | ICD-10-CM | POA: Diagnosis not present

## 2022-04-04 DIAGNOSIS — R278 Other lack of coordination: Secondary | ICD-10-CM | POA: Diagnosis not present

## 2022-04-05 DIAGNOSIS — R2689 Other abnormalities of gait and mobility: Secondary | ICD-10-CM | POA: Diagnosis not present

## 2022-04-05 DIAGNOSIS — R41841 Cognitive communication deficit: Secondary | ICD-10-CM | POA: Diagnosis not present

## 2022-04-05 DIAGNOSIS — R278 Other lack of coordination: Secondary | ICD-10-CM | POA: Diagnosis not present

## 2022-04-05 DIAGNOSIS — R1312 Dysphagia, oropharyngeal phase: Secondary | ICD-10-CM | POA: Diagnosis not present

## 2022-04-05 DIAGNOSIS — M25572 Pain in left ankle and joints of left foot: Secondary | ICD-10-CM | POA: Diagnosis not present

## 2022-04-05 DIAGNOSIS — M6281 Muscle weakness (generalized): Secondary | ICD-10-CM | POA: Diagnosis not present

## 2022-04-08 DIAGNOSIS — M25572 Pain in left ankle and joints of left foot: Secondary | ICD-10-CM | POA: Diagnosis not present

## 2022-04-08 DIAGNOSIS — R2689 Other abnormalities of gait and mobility: Secondary | ICD-10-CM | POA: Diagnosis not present

## 2022-04-08 DIAGNOSIS — M6281 Muscle weakness (generalized): Secondary | ICD-10-CM | POA: Diagnosis not present

## 2022-04-08 DIAGNOSIS — R1312 Dysphagia, oropharyngeal phase: Secondary | ICD-10-CM | POA: Diagnosis not present

## 2022-04-08 DIAGNOSIS — R278 Other lack of coordination: Secondary | ICD-10-CM | POA: Diagnosis not present

## 2022-04-08 DIAGNOSIS — R41841 Cognitive communication deficit: Secondary | ICD-10-CM | POA: Diagnosis not present

## 2022-04-09 DIAGNOSIS — R2689 Other abnormalities of gait and mobility: Secondary | ICD-10-CM | POA: Diagnosis not present

## 2022-04-09 DIAGNOSIS — M25572 Pain in left ankle and joints of left foot: Secondary | ICD-10-CM | POA: Diagnosis not present

## 2022-04-09 DIAGNOSIS — R278 Other lack of coordination: Secondary | ICD-10-CM | POA: Diagnosis not present

## 2022-04-09 DIAGNOSIS — R1312 Dysphagia, oropharyngeal phase: Secondary | ICD-10-CM | POA: Diagnosis not present

## 2022-04-09 DIAGNOSIS — M6281 Muscle weakness (generalized): Secondary | ICD-10-CM | POA: Diagnosis not present

## 2022-04-09 DIAGNOSIS — R41841 Cognitive communication deficit: Secondary | ICD-10-CM | POA: Diagnosis not present

## 2022-04-12 DIAGNOSIS — R2689 Other abnormalities of gait and mobility: Secondary | ICD-10-CM | POA: Diagnosis not present

## 2022-04-12 DIAGNOSIS — R278 Other lack of coordination: Secondary | ICD-10-CM | POA: Diagnosis not present

## 2022-04-12 DIAGNOSIS — M25572 Pain in left ankle and joints of left foot: Secondary | ICD-10-CM | POA: Diagnosis not present

## 2022-04-12 DIAGNOSIS — R1312 Dysphagia, oropharyngeal phase: Secondary | ICD-10-CM | POA: Diagnosis not present

## 2022-04-12 DIAGNOSIS — M6281 Muscle weakness (generalized): Secondary | ICD-10-CM | POA: Diagnosis not present

## 2022-04-12 DIAGNOSIS — R41841 Cognitive communication deficit: Secondary | ICD-10-CM | POA: Diagnosis not present

## 2022-04-15 ENCOUNTER — Non-Acute Institutional Stay: Payer: Medicare PPO | Admitting: Nurse Practitioner

## 2022-04-15 DIAGNOSIS — J309 Allergic rhinitis, unspecified: Secondary | ICD-10-CM | POA: Diagnosis not present

## 2022-04-15 DIAGNOSIS — R278 Other lack of coordination: Secondary | ICD-10-CM | POA: Diagnosis not present

## 2022-04-15 DIAGNOSIS — I480 Paroxysmal atrial fibrillation: Secondary | ICD-10-CM

## 2022-04-15 DIAGNOSIS — I10 Essential (primary) hypertension: Secondary | ICD-10-CM

## 2022-04-15 DIAGNOSIS — K219 Gastro-esophageal reflux disease without esophagitis: Secondary | ICD-10-CM

## 2022-04-15 DIAGNOSIS — M069 Rheumatoid arthritis, unspecified: Secondary | ICD-10-CM

## 2022-04-15 DIAGNOSIS — M810 Age-related osteoporosis without current pathological fracture: Secondary | ICD-10-CM | POA: Diagnosis not present

## 2022-04-15 DIAGNOSIS — L89522 Pressure ulcer of left ankle, stage 2: Secondary | ICD-10-CM

## 2022-04-15 DIAGNOSIS — E785 Hyperlipidemia, unspecified: Secondary | ICD-10-CM | POA: Diagnosis not present

## 2022-04-15 DIAGNOSIS — M6281 Muscle weakness (generalized): Secondary | ICD-10-CM | POA: Diagnosis not present

## 2022-04-15 DIAGNOSIS — N1831 Chronic kidney disease, stage 3a: Secondary | ICD-10-CM

## 2022-04-15 DIAGNOSIS — I5030 Unspecified diastolic (congestive) heart failure: Secondary | ICD-10-CM | POA: Diagnosis not present

## 2022-04-15 DIAGNOSIS — R41841 Cognitive communication deficit: Secondary | ICD-10-CM | POA: Diagnosis not present

## 2022-04-15 DIAGNOSIS — E0590 Thyrotoxicosis, unspecified without thyrotoxic crisis or storm: Secondary | ICD-10-CM

## 2022-04-15 DIAGNOSIS — R2689 Other abnormalities of gait and mobility: Secondary | ICD-10-CM | POA: Diagnosis not present

## 2022-04-15 DIAGNOSIS — M25572 Pain in left ankle and joints of left foot: Secondary | ICD-10-CM | POA: Diagnosis not present

## 2022-04-15 DIAGNOSIS — E871 Hypo-osmolality and hyponatremia: Secondary | ICD-10-CM

## 2022-04-15 DIAGNOSIS — R1312 Dysphagia, oropharyngeal phase: Secondary | ICD-10-CM | POA: Diagnosis not present

## 2022-04-15 NOTE — Assessment & Plan Note (Signed)
Bun/creat 30/1.27 03/26/22

## 2022-04-15 NOTE — Assessment & Plan Note (Signed)
Na 135 03/26/22

## 2022-04-15 NOTE — Assessment & Plan Note (Addendum)
on Atorvastatin

## 2022-04-15 NOTE — Assessment & Plan Note (Signed)
Blood pressure is controlled

## 2022-04-15 NOTE — Assessment & Plan Note (Addendum)
Stable, continue Zyrtec, Singulair

## 2022-04-15 NOTE — Assessment & Plan Note (Addendum)
Stable, on Pepcid, Hgb 13.8 03/26/22

## 2022-04-15 NOTE — Assessment & Plan Note (Signed)
Compensated clinically, MVP, 01/11/22 Echo EF 60-65%,  on Furosemide, Spironolacton

## 2022-04-15 NOTE — Assessment & Plan Note (Addendum)
the left lateral malleolus pressure ulcer, pain with ROM,  apply santyl oint to the open area, cover with Mepilex daily, wound care center to eval and tx.  Will try Tylenol 650mg  tid with meals for now.

## 2022-04-15 NOTE — Assessment & Plan Note (Signed)
Heart rate is in control, on Eliquis, Metoprolol

## 2022-04-15 NOTE — Assessment & Plan Note (Signed)
Stable,  takes Prednisone

## 2022-04-15 NOTE — Assessment & Plan Note (Addendum)
on Alendronate, Ca, Vit D, 03/21/22 DEXA t score 0.995

## 2022-04-15 NOTE — Assessment & Plan Note (Signed)
Reduced Methimazole '5mg'$  q8hrs from '10mg'$  bid 03/26/22, TSH 0.020 07/26/21<<24 03/26/22, repeat TSH 6 weeks from 03/26/22

## 2022-04-15 NOTE — Progress Notes (Addendum)
Location:   [facility] Room Number: 094 Place of Service:  ALF (13) Provider: Lennie Odor Maxx Calaway NP  [name], FNP  Patient Care Team: [name], FNP as PCP - General (Family Medicine) Stanford Breed Denice Bors, MD as PCP - Cardiology (Cardiology) Elayne Snare, MD as Consulting Physician (Endocrinology)  Extended Emergency Contact Information Primary Emergency Contact: [facility] Address: [address]          [place], [place] [address] Johnnette Litter of [place] Phone: [phone] Relation: Son  Code Status: DNR Goals of care: Advanced Directive information    01/23/2022   11:20 AM  Advanced Directives  Does Patient Have a Medical Advance Directive? No  Does patient want to make changes to medical advance directive? No - Patient declined     Chief Complaint  Patient presents with   Acute Visit    Lateral left ankle pressure ulcer    HPI:  Pt is a 89 y.o. female seen today for an acute visit for the left lateral malleolus pressure ulcer.      CHF, MVP, 01/11/22 Echo EF 60-65%,  on Furosemide, Spironolactone             HTN, controlled             Afib, on Eliquis, Metoprolol             RA, takes Prednisone             HLD, on Atorvastatin             OP on Alendronate, Ca, Vit D, 03/21/22 DEXA t score 0.995             Allergic rhinitis, takes Zyrtec, Singulair             GERD, on Pepcid, Hgb 13.8 03/26/22             Hyperthyroidism, 03/26/22 reduced Methimazole, TSH 0.020 07/26/21<<24 03/26/22             Glaucoma, eye drops               Hyponatremia, Na 135 03/26/22             CKD, Bun/creat 30/1.27 03/26/22 Past Medical History:  Diagnosis Date   Arthritis    Glaucoma of both eyes    Hyperlipidemia    Hypertension    Pelvic prolapse    anterior floor   Urgency of urination    Urinary hesitancy    Urticaria    Past Surgical History:  Procedure Laterality Date   PUBOVAGINAL SLING N/A 08/31/2012   Procedure: BOSTON SCIENTIFIC UPHOLD  LIGHT [place];  Surgeon: Ailene Rud, MD;  Location: [facility];  Service: Urology;  Laterality: N/A;   RETINAL DETACHMENT SURGERY Right    RIGHT/LEFT HEART CATH AND CORONARY ANGIOGRAPHY N/A 01/15/2022   Procedure: RIGHT/LEFT HEART CATH AND CORONARY ANGIOGRAPHY;  Surgeon: Early Osmond, MD;  Location: [facility];  Service: Cardiovascular;  Laterality: N/A;   TEE WITHOUT CARDIOVERSION N/A 09/05/2021   Procedure: TRANSESOPHAGEAL ECHOCARDIOGRAM (TEE);  Surgeon: Sanda Klein, MD;  Location: [facility] ENDOSCOPY;  Service: Cardiovascular;  Laterality: N/A;    Allergies  Allergen Reactions   Cozaar [Losartan] Swelling    Tongue swelling   Erythromycin Itching   Azopt [Brinzolamide] Rash    Rash on eyelid    Allergies as of 04/15/2022       Reactions   Cozaar [losartan] Swelling   Tongue  swelling   Erythromycin Itching   Azopt [brinzolamide] Rash   Rash on eyelid        Medication List        Accurate as of April 15, 2022 11:59 PM. If you have any questions, ask your nurse or doctor.          alendronate 70 MG tablet Commonly known as: FOSAMAX Take 70 mg by mouth every Wednesday.   apixaban 2.5 MG Tabs tablet Commonly known as: ELIQUIS Take 1 tablet (2.5 mg total) by mouth 2 (two) times daily.   atorvastatin 20 MG tablet Commonly known as: LIPITOR Take 20 mg by mouth daily.   CALCIUM PO Take 1 tablet by mouth daily with supper.   cetirizine 10 MG tablet Commonly known as: ZYRTEC Take 1 tablet (10 mg total) by mouth 2 (two) times daily.   diltiazem 180 MG 24 hr capsule Commonly known as: CARDIZEM CD Take 1 capsule (180 mg total) by mouth daily.   EPINEPHrine 0.3 mg/0.3 mL Soaj injection Commonly known as: EpiPen Inject 0.3 mg into the muscle once as needed for anaphylaxis.   famotidine 20 MG tablet Commonly known as: PEPCID Take 1 tablet (20 mg total) by mouth 2 (two) times daily.   Fish Oil 1000 MG Caps Take 1,000 mg  by mouth every morning.   furosemide 40 MG tablet Commonly known as: LASIX Take 1 tablet (40 mg total) by mouth 2 (two) times daily.   lactose free nutrition Liqd Take 237 mLs by mouth 3 (three) times daily between meals.   latanoprost 0.005 % ophthalmic solution Commonly known as: XALATAN Place 1 drop into the left eye at bedtime.   leflunomide 10 MG tablet Commonly known as: ARAVA Take 10 mg by mouth every morning.   methimazole 10 MG tablet Commonly known as: TAPAZOLE Take 1 tablet (10 mg total) by mouth 2 (two) times daily.   metoprolol succinate 100 MG 24 hr tablet Commonly known as: TOPROL-XL Take 1.5 tablets (150 mg total) by mouth every morning.   montelukast 10 MG tablet Commonly known as: SINGULAIR Take 1 tablet (10 mg total) by mouth daily.   multivitamin with minerals Tabs tablet Take 1 tablet by mouth every morning.   predniSONE 2.5 MG tablet Commonly known as: DELTASONE Take 1 tablet (2.5 mg total) by mouth daily with breakfast.   PROBIOTIC DAILY PO Take 1 capsule by mouth daily with supper.   spironolactone 25 MG tablet Commonly known as: ALDACTONE Take 0.5 tablets (12.5 mg total) by mouth daily.   timolol 0.5 % ophthalmic solution Commonly known as: TIMOPTIC Place 1 drop into both eyes 2 (two) times daily.   VITAMIN C PO Take 1 tablet by mouth every morning.   VITAMIN D3 PO Take 1,000 Units by mouth daily with supper.        Review of Systems  Constitutional:  Negative for appetite change, fatigue and fever.  HENT:  Positive for hearing loss. Negative for congestion and trouble swallowing.   Eyes:  Negative for visual disturbance.  Respiratory:  Positive for shortness of breath. Negative for cough and wheezing.        DOE  Cardiovascular:  Negative for chest pain, palpitations and leg swelling.  Gastrointestinal:  Negative for abdominal pain and constipation.  Genitourinary:  Negative for dysuria and urgency.  Musculoskeletal:   Positive for arthralgias and gait problem.  Skin:  Positive for wound.  Neurological:  Negative for weakness and headaches.  Psychiatric/Behavioral:  Negative for  behavioral problems and sleep disturbance. The patient is not nervous/anxious.     Immunization History  Administered Date(s) Administered   PFIZER(Purple Top)SARS-COV-2 Vaccination 04/15/2019, 05/12/2019, 11/27/2019   Pertinent  Health Maintenance Due  Topic Date Due   INFLUENZA VACCINE  Never done   DEXA SCAN  Completed      01/14/2022    7:48 AM 01/14/2022    9:05 PM 01/15/2022    7:45 AM 01/15/2022    9:01 PM 01/16/2022   10:50 AM  Fall Risk  (RETIRED) Patient Fall Risk Level High fall risk High fall risk High fall risk High fall risk High fall risk   Functional Status Survey:    Vitals:   04/15/22 1706  BP: 110/68  Pulse: 72  Resp: 16  Temp: (!) 97.3 F (36.3 C)  SpO2: 94%  Weight: 99 lb 12.8 oz (45.3 kg)   Body mass index is 17.68 kg/m. Physical Exam Vitals and nursing note reviewed.  Constitutional:      Appearance: Normal appearance.  HENT:     Head: Normocephalic and atraumatic.     Nose: Nose normal.     Mouth/Throat:     Mouth: Mucous membranes are moist.  Eyes:     Extraocular Movements: Extraocular movements intact.     Conjunctiva/sclera: Conjunctivae normal.     Pupils: Pupils are equal, round, and reactive to light.  Cardiovascular:     Rate and Rhythm: Normal rate. Rhythm irregular.     Heart sounds: Murmur heard.     Comments: DP pulses not felt R+L Pulmonary:     Effort: Pulmonary effort is normal.     Breath sounds: No wheezing, rhonchi or rales.  Abdominal:     General: Bowel sounds are normal.     Palpations: Abdomen is soft.     Tenderness: There is no abdominal tenderness.  Musculoskeletal:     Cervical back: Normal range of motion and neck supple.     Right lower leg: No edema.     Left lower leg: Edema present.     Comments: Trace edema BLE  Skin:    General: Skin is  warm and dry.     Findings: Erythema present.     Comments: Lateral left malleolus open area a quarter sized, slightly erythema at wound margin, no odorous drainage, yellow slough in wound bed.   Neurological:     General: No focal deficit present.     Mental Status: She is alert and oriented to person, place, and time. Mental status is at baseline.     Gait: Gait abnormal.  Psychiatric:        Mood and Affect: Mood normal.        Behavior: Behavior normal.        Thought Content: Thought content normal.     Labs reviewed: Recent Labs    01/13/22 0909 01/14/22 0149 01/15/22 0442 01/15/22 1527 01/15/22 1533 01/15/22 1538  NA 135 130* 131* 131* 117* 131*  K 3.6 3.5 3.2* 4.2 3.3* 4.3  CL 92* 92* 93*  --   --   --   CO2 33* 29 27  --   --   --   GLUCOSE 105* 115* 109*  --   --   --   BUN 36* 41* 38*  --   --   --   CREATININE 1.14* 1.10* 1.00  --   --   --   CALCIUM 8.9 8.9 9.1  --   --   --  MG 2.3 2.1 2.0  --   --   --   PHOS 2.6 2.5 2.6  --   --   --    Recent Labs    01/13/22 0909 01/14/22 0149 01/15/22 0442  AST 64* 49* 38  ALT 119* 95* 80*  ALKPHOS 194* 174* 160*  BILITOT 1.2 1.2 1.0  PROT 6.2* 6.2* 6.4*  ALBUMIN 3.6 3.4* 3.3*   Recent Labs    01/13/22 0909 01/14/22 0149 01/15/22 0442 01/15/22 1527 01/15/22 1533 01/15/22 1538  WBC 10.9* 11.6* 11.1*  --   --   --   NEUTROABS 8.1* 8.5* 7.4  --   --   --   HGB 13.2 12.8 14.5 15.3* 13.9 16.0*  HCT 40.0 38.3 43.3 45.0 41.0 47.0*  MCV 99.8 98.2 98.2  --   --   --   PLT 194 185 231  --   --   --    Lab Results  Component Value Date   TSH 10.519 (H) 01/10/2022   Lab Results  Component Value Date   HGBA1C 5.7 (H) 09/03/2021   Lab Results  Component Value Date   CHOL 144 07/26/2021   HDL 52 07/26/2021   LDLCALC 82 07/26/2021   TRIG 52 07/26/2021   CHOLHDL 2.8 07/26/2021    Significant Diagnostic Results in last 30 days:  No results found.  Assessment/Plan: Pressure ulcer of left ankle, stage  2 (HCC)  the left lateral malleolus pressure ulcer, pain with ROM,  apply santyl oint to the open area, cover with Mepilex daily, wound care center to eval and tx.  Will try Tylenol 650mg  tid with meals for now.   CHF (congestive heart failure) (HCC) Compensated clinically, MVP, 01/11/22 Echo EF 60-65%,  on Furosemide, Spironolacton  HTN (hypertension) Blood pressure is controlled  Paroxysmal atrial fibrillation (HCC) Heart rate is in control, on Eliquis, Metoprolol  Rheumatoid arthritis (HCC) Stable,  takes Prednisone  Hyperlipidemia on Atorvastatin  Osteoporosis  on Alendronate, Ca, Vit D, 03/21/22 DEXA t score 0.995  Allergic rhinitis Stable, continue Zyrtec, Singulair  GERD (gastroesophageal reflux disease) Stable, on Pepcid, Hgb 13.8 03/26/22  Hyperthyroidism Reduced Methimazole 5mg  q8hrs from 10mg  bid 03/26/22, TSH 0.020 07/26/21<<24 03/26/22, repeat TSH 6 weeks from 03/26/22  CKD (chronic kidney disease) stage 3, GFR 30-59 ml/min ([place]) Bun/creat 30/1.27 03/26/22  Hyponatremia Na 135 03/26/22    Family/ staff Communication: plan of care reviewed with the patient and charge nurse   Labs/tests ordered:  none  Time spend 40 minutes

## 2022-04-16 ENCOUNTER — Encounter: Payer: Self-pay | Admitting: Nurse Practitioner

## 2022-04-16 DIAGNOSIS — M25572 Pain in left ankle and joints of left foot: Secondary | ICD-10-CM | POA: Diagnosis not present

## 2022-04-16 DIAGNOSIS — R2689 Other abnormalities of gait and mobility: Secondary | ICD-10-CM | POA: Diagnosis not present

## 2022-04-16 DIAGNOSIS — R1312 Dysphagia, oropharyngeal phase: Secondary | ICD-10-CM | POA: Diagnosis not present

## 2022-04-16 DIAGNOSIS — M6281 Muscle weakness (generalized): Secondary | ICD-10-CM | POA: Diagnosis not present

## 2022-04-16 DIAGNOSIS — R278 Other lack of coordination: Secondary | ICD-10-CM | POA: Diagnosis not present

## 2022-04-16 DIAGNOSIS — R41841 Cognitive communication deficit: Secondary | ICD-10-CM | POA: Diagnosis not present

## 2022-04-17 DIAGNOSIS — M25572 Pain in left ankle and joints of left foot: Secondary | ICD-10-CM | POA: Diagnosis not present

## 2022-04-17 DIAGNOSIS — M6281 Muscle weakness (generalized): Secondary | ICD-10-CM | POA: Diagnosis not present

## 2022-04-17 DIAGNOSIS — R41841 Cognitive communication deficit: Secondary | ICD-10-CM | POA: Diagnosis not present

## 2022-04-17 DIAGNOSIS — R2689 Other abnormalities of gait and mobility: Secondary | ICD-10-CM | POA: Diagnosis not present

## 2022-04-17 DIAGNOSIS — R278 Other lack of coordination: Secondary | ICD-10-CM | POA: Diagnosis not present

## 2022-04-17 DIAGNOSIS — R1312 Dysphagia, oropharyngeal phase: Secondary | ICD-10-CM | POA: Diagnosis not present

## 2022-04-18 DIAGNOSIS — R2689 Other abnormalities of gait and mobility: Secondary | ICD-10-CM | POA: Diagnosis not present

## 2022-04-18 DIAGNOSIS — R278 Other lack of coordination: Secondary | ICD-10-CM | POA: Diagnosis not present

## 2022-04-18 DIAGNOSIS — R41841 Cognitive communication deficit: Secondary | ICD-10-CM | POA: Diagnosis not present

## 2022-04-18 DIAGNOSIS — R1312 Dysphagia, oropharyngeal phase: Secondary | ICD-10-CM | POA: Diagnosis not present

## 2022-04-18 DIAGNOSIS — M25572 Pain in left ankle and joints of left foot: Secondary | ICD-10-CM | POA: Diagnosis not present

## 2022-04-18 DIAGNOSIS — M6281 Muscle weakness (generalized): Secondary | ICD-10-CM | POA: Diagnosis not present

## 2022-04-19 DIAGNOSIS — R1312 Dysphagia, oropharyngeal phase: Secondary | ICD-10-CM | POA: Diagnosis not present

## 2022-04-19 DIAGNOSIS — M25572 Pain in left ankle and joints of left foot: Secondary | ICD-10-CM | POA: Diagnosis not present

## 2022-04-19 DIAGNOSIS — R41841 Cognitive communication deficit: Secondary | ICD-10-CM | POA: Diagnosis not present

## 2022-04-19 DIAGNOSIS — R2689 Other abnormalities of gait and mobility: Secondary | ICD-10-CM | POA: Diagnosis not present

## 2022-04-19 DIAGNOSIS — M6281 Muscle weakness (generalized): Secondary | ICD-10-CM | POA: Diagnosis not present

## 2022-04-19 DIAGNOSIS — R278 Other lack of coordination: Secondary | ICD-10-CM | POA: Diagnosis not present

## 2022-04-22 DIAGNOSIS — R278 Other lack of coordination: Secondary | ICD-10-CM | POA: Diagnosis not present

## 2022-04-22 DIAGNOSIS — M6281 Muscle weakness (generalized): Secondary | ICD-10-CM | POA: Diagnosis not present

## 2022-04-22 DIAGNOSIS — M25572 Pain in left ankle and joints of left foot: Secondary | ICD-10-CM | POA: Diagnosis not present

## 2022-04-22 DIAGNOSIS — R2689 Other abnormalities of gait and mobility: Secondary | ICD-10-CM | POA: Diagnosis not present

## 2022-04-22 DIAGNOSIS — R41841 Cognitive communication deficit: Secondary | ICD-10-CM | POA: Diagnosis not present

## 2022-04-22 DIAGNOSIS — R1312 Dysphagia, oropharyngeal phase: Secondary | ICD-10-CM | POA: Diagnosis not present

## 2022-04-23 DIAGNOSIS — R41841 Cognitive communication deficit: Secondary | ICD-10-CM | POA: Diagnosis not present

## 2022-04-23 DIAGNOSIS — R1312 Dysphagia, oropharyngeal phase: Secondary | ICD-10-CM | POA: Diagnosis not present

## 2022-04-23 DIAGNOSIS — M25572 Pain in left ankle and joints of left foot: Secondary | ICD-10-CM | POA: Diagnosis not present

## 2022-04-23 DIAGNOSIS — R2689 Other abnormalities of gait and mobility: Secondary | ICD-10-CM | POA: Diagnosis not present

## 2022-04-23 DIAGNOSIS — M6281 Muscle weakness (generalized): Secondary | ICD-10-CM | POA: Diagnosis not present

## 2022-04-23 DIAGNOSIS — R278 Other lack of coordination: Secondary | ICD-10-CM | POA: Diagnosis not present

## 2022-04-25 DIAGNOSIS — R1312 Dysphagia, oropharyngeal phase: Secondary | ICD-10-CM | POA: Diagnosis not present

## 2022-04-25 DIAGNOSIS — M25572 Pain in left ankle and joints of left foot: Secondary | ICD-10-CM | POA: Diagnosis not present

## 2022-04-25 DIAGNOSIS — R2689 Other abnormalities of gait and mobility: Secondary | ICD-10-CM | POA: Diagnosis not present

## 2022-04-25 DIAGNOSIS — R278 Other lack of coordination: Secondary | ICD-10-CM | POA: Diagnosis not present

## 2022-04-25 DIAGNOSIS — R41841 Cognitive communication deficit: Secondary | ICD-10-CM | POA: Diagnosis not present

## 2022-04-25 DIAGNOSIS — M6281 Muscle weakness (generalized): Secondary | ICD-10-CM | POA: Diagnosis not present

## 2022-04-29 DIAGNOSIS — R1312 Dysphagia, oropharyngeal phase: Secondary | ICD-10-CM | POA: Diagnosis not present

## 2022-04-29 DIAGNOSIS — M25572 Pain in left ankle and joints of left foot: Secondary | ICD-10-CM | POA: Diagnosis not present

## 2022-04-29 DIAGNOSIS — R2689 Other abnormalities of gait and mobility: Secondary | ICD-10-CM | POA: Diagnosis not present

## 2022-04-29 DIAGNOSIS — R41841 Cognitive communication deficit: Secondary | ICD-10-CM | POA: Diagnosis not present

## 2022-04-29 DIAGNOSIS — M6281 Muscle weakness (generalized): Secondary | ICD-10-CM | POA: Diagnosis not present

## 2022-04-29 DIAGNOSIS — R278 Other lack of coordination: Secondary | ICD-10-CM | POA: Diagnosis not present

## 2022-04-30 DIAGNOSIS — R1312 Dysphagia, oropharyngeal phase: Secondary | ICD-10-CM | POA: Diagnosis not present

## 2022-04-30 DIAGNOSIS — R41841 Cognitive communication deficit: Secondary | ICD-10-CM | POA: Diagnosis not present

## 2022-04-30 DIAGNOSIS — R2689 Other abnormalities of gait and mobility: Secondary | ICD-10-CM | POA: Diagnosis not present

## 2022-04-30 DIAGNOSIS — M25572 Pain in left ankle and joints of left foot: Secondary | ICD-10-CM | POA: Diagnosis not present

## 2022-04-30 DIAGNOSIS — M6281 Muscle weakness (generalized): Secondary | ICD-10-CM | POA: Diagnosis not present

## 2022-04-30 DIAGNOSIS — R278 Other lack of coordination: Secondary | ICD-10-CM | POA: Diagnosis not present

## 2022-05-01 DIAGNOSIS — M25572 Pain in left ankle and joints of left foot: Secondary | ICD-10-CM | POA: Diagnosis not present

## 2022-05-01 DIAGNOSIS — R278 Other lack of coordination: Secondary | ICD-10-CM | POA: Diagnosis not present

## 2022-05-01 DIAGNOSIS — R2689 Other abnormalities of gait and mobility: Secondary | ICD-10-CM | POA: Diagnosis not present

## 2022-05-01 DIAGNOSIS — R41841 Cognitive communication deficit: Secondary | ICD-10-CM | POA: Diagnosis not present

## 2022-05-01 DIAGNOSIS — M6281 Muscle weakness (generalized): Secondary | ICD-10-CM | POA: Diagnosis not present

## 2022-05-01 DIAGNOSIS — R1312 Dysphagia, oropharyngeal phase: Secondary | ICD-10-CM | POA: Diagnosis not present

## 2022-05-02 DIAGNOSIS — R1312 Dysphagia, oropharyngeal phase: Secondary | ICD-10-CM | POA: Diagnosis not present

## 2022-05-02 DIAGNOSIS — M6281 Muscle weakness (generalized): Secondary | ICD-10-CM | POA: Diagnosis not present

## 2022-05-02 DIAGNOSIS — R41841 Cognitive communication deficit: Secondary | ICD-10-CM | POA: Diagnosis not present

## 2022-05-02 DIAGNOSIS — M25572 Pain in left ankle and joints of left foot: Secondary | ICD-10-CM | POA: Diagnosis not present

## 2022-05-02 DIAGNOSIS — R2689 Other abnormalities of gait and mobility: Secondary | ICD-10-CM | POA: Diagnosis not present

## 2022-05-02 DIAGNOSIS — R278 Other lack of coordination: Secondary | ICD-10-CM | POA: Diagnosis not present

## 2022-05-03 DIAGNOSIS — M6281 Muscle weakness (generalized): Secondary | ICD-10-CM | POA: Diagnosis not present

## 2022-05-03 DIAGNOSIS — R1312 Dysphagia, oropharyngeal phase: Secondary | ICD-10-CM | POA: Diagnosis not present

## 2022-05-03 DIAGNOSIS — R278 Other lack of coordination: Secondary | ICD-10-CM | POA: Diagnosis not present

## 2022-05-03 DIAGNOSIS — R2689 Other abnormalities of gait and mobility: Secondary | ICD-10-CM | POA: Diagnosis not present

## 2022-05-03 DIAGNOSIS — R41841 Cognitive communication deficit: Secondary | ICD-10-CM | POA: Diagnosis not present

## 2022-05-03 DIAGNOSIS — M25572 Pain in left ankle and joints of left foot: Secondary | ICD-10-CM | POA: Diagnosis not present

## 2022-05-06 DIAGNOSIS — R278 Other lack of coordination: Secondary | ICD-10-CM | POA: Diagnosis not present

## 2022-05-06 DIAGNOSIS — R1312 Dysphagia, oropharyngeal phase: Secondary | ICD-10-CM | POA: Diagnosis not present

## 2022-05-06 DIAGNOSIS — R2689 Other abnormalities of gait and mobility: Secondary | ICD-10-CM | POA: Diagnosis not present

## 2022-05-06 DIAGNOSIS — M25572 Pain in left ankle and joints of left foot: Secondary | ICD-10-CM | POA: Diagnosis not present

## 2022-05-06 DIAGNOSIS — R41841 Cognitive communication deficit: Secondary | ICD-10-CM | POA: Diagnosis not present

## 2022-05-06 DIAGNOSIS — M6281 Muscle weakness (generalized): Secondary | ICD-10-CM | POA: Diagnosis not present

## 2022-05-07 DIAGNOSIS — M6281 Muscle weakness (generalized): Secondary | ICD-10-CM | POA: Diagnosis not present

## 2022-05-07 DIAGNOSIS — R2689 Other abnormalities of gait and mobility: Secondary | ICD-10-CM | POA: Diagnosis not present

## 2022-05-07 DIAGNOSIS — R41841 Cognitive communication deficit: Secondary | ICD-10-CM | POA: Diagnosis not present

## 2022-05-07 DIAGNOSIS — E0591 Thyrotoxicosis, unspecified with thyrotoxic crisis or storm: Secondary | ICD-10-CM | POA: Diagnosis not present

## 2022-05-07 DIAGNOSIS — R278 Other lack of coordination: Secondary | ICD-10-CM | POA: Diagnosis not present

## 2022-05-07 DIAGNOSIS — R1312 Dysphagia, oropharyngeal phase: Secondary | ICD-10-CM | POA: Diagnosis not present

## 2022-05-07 DIAGNOSIS — M25572 Pain in left ankle and joints of left foot: Secondary | ICD-10-CM | POA: Diagnosis not present

## 2022-05-07 LAB — TSH: TSH: 33.23 — AB (ref 0.41–5.90)

## 2022-05-09 ENCOUNTER — Encounter (HOSPITAL_BASED_OUTPATIENT_CLINIC_OR_DEPARTMENT_OTHER): Payer: Medicare PPO | Attending: Internal Medicine | Admitting: Internal Medicine

## 2022-05-09 DIAGNOSIS — L89520 Pressure ulcer of left ankle, unstageable: Secondary | ICD-10-CM | POA: Diagnosis not present

## 2022-05-09 DIAGNOSIS — L97329 Non-pressure chronic ulcer of left ankle with unspecified severity: Secondary | ICD-10-CM | POA: Diagnosis not present

## 2022-05-09 DIAGNOSIS — I5032 Chronic diastolic (congestive) heart failure: Secondary | ICD-10-CM | POA: Diagnosis not present

## 2022-05-09 DIAGNOSIS — I110 Hypertensive heart disease with heart failure: Secondary | ICD-10-CM | POA: Insufficient documentation

## 2022-05-09 DIAGNOSIS — I482 Chronic atrial fibrillation, unspecified: Secondary | ICD-10-CM | POA: Insufficient documentation

## 2022-05-09 DIAGNOSIS — Z7901 Long term (current) use of anticoagulants: Secondary | ICD-10-CM | POA: Diagnosis not present

## 2022-05-13 ENCOUNTER — Non-Acute Institutional Stay: Payer: Medicare PPO | Admitting: Nurse Practitioner

## 2022-05-13 ENCOUNTER — Inpatient Hospital Stay (HOSPITAL_COMMUNITY)
Admission: EM | Admit: 2022-05-13 | Discharge: 2022-06-12 | DRG: 871 | Disposition: E | Payer: Medicare PPO | Source: Skilled Nursing Facility | Attending: Family Medicine | Admitting: Family Medicine

## 2022-05-13 ENCOUNTER — Encounter: Payer: Self-pay | Admitting: Nurse Practitioner

## 2022-05-13 ENCOUNTER — Emergency Department (HOSPITAL_COMMUNITY): Payer: Medicare PPO

## 2022-05-13 DIAGNOSIS — E0591 Thyrotoxicosis, unspecified with thyrotoxic crisis or storm: Secondary | ICD-10-CM | POA: Diagnosis not present

## 2022-05-13 DIAGNOSIS — R0602 Shortness of breath: Secondary | ICD-10-CM | POA: Diagnosis not present

## 2022-05-13 DIAGNOSIS — M069 Rheumatoid arthritis, unspecified: Secondary | ICD-10-CM | POA: Diagnosis not present

## 2022-05-13 DIAGNOSIS — K219 Gastro-esophageal reflux disease without esophagitis: Secondary | ICD-10-CM

## 2022-05-13 DIAGNOSIS — A419 Sepsis, unspecified organism: Secondary | ICD-10-CM | POA: Diagnosis not present

## 2022-05-13 DIAGNOSIS — J9601 Acute respiratory failure with hypoxia: Secondary | ICD-10-CM

## 2022-05-13 DIAGNOSIS — N179 Acute kidney failure, unspecified: Secondary | ICD-10-CM | POA: Diagnosis present

## 2022-05-13 DIAGNOSIS — R4182 Altered mental status, unspecified: Secondary | ICD-10-CM | POA: Diagnosis not present

## 2022-05-13 DIAGNOSIS — E872 Acidosis, unspecified: Secondary | ICD-10-CM | POA: Diagnosis present

## 2022-05-13 DIAGNOSIS — H409 Unspecified glaucoma: Secondary | ICD-10-CM | POA: Diagnosis present

## 2022-05-13 DIAGNOSIS — E7800 Pure hypercholesterolemia, unspecified: Secondary | ICD-10-CM | POA: Diagnosis present

## 2022-05-13 DIAGNOSIS — G9341 Metabolic encephalopathy: Secondary | ICD-10-CM | POA: Diagnosis not present

## 2022-05-13 DIAGNOSIS — Z1152 Encounter for screening for COVID-19: Secondary | ICD-10-CM

## 2022-05-13 DIAGNOSIS — I129 Hypertensive chronic kidney disease with stage 1 through stage 4 chronic kidney disease, or unspecified chronic kidney disease: Secondary | ICD-10-CM | POA: Diagnosis present

## 2022-05-13 DIAGNOSIS — R001 Bradycardia, unspecified: Secondary | ICD-10-CM | POA: Diagnosis present

## 2022-05-13 DIAGNOSIS — R7989 Other specified abnormal findings of blood chemistry: Secondary | ICD-10-CM | POA: Diagnosis present

## 2022-05-13 DIAGNOSIS — I130 Hypertensive heart and chronic kidney disease with heart failure and stage 1 through stage 4 chronic kidney disease, or unspecified chronic kidney disease: Secondary | ICD-10-CM | POA: Diagnosis not present

## 2022-05-13 DIAGNOSIS — I340 Nonrheumatic mitral (valve) insufficiency: Secondary | ICD-10-CM | POA: Diagnosis present

## 2022-05-13 DIAGNOSIS — I10 Essential (primary) hypertension: Secondary | ICD-10-CM

## 2022-05-13 DIAGNOSIS — R230 Cyanosis: Secondary | ICD-10-CM | POA: Diagnosis present

## 2022-05-13 DIAGNOSIS — I509 Heart failure, unspecified: Secondary | ICD-10-CM

## 2022-05-13 DIAGNOSIS — R680 Hypothermia, not associated with low environmental temperature: Secondary | ICD-10-CM | POA: Diagnosis present

## 2022-05-13 DIAGNOSIS — Z7952 Long term (current) use of systemic steroids: Secondary | ICD-10-CM

## 2022-05-13 DIAGNOSIS — E43 Unspecified severe protein-calorie malnutrition: Secondary | ICD-10-CM | POA: Diagnosis not present

## 2022-05-13 DIAGNOSIS — Z8249 Family history of ischemic heart disease and other diseases of the circulatory system: Secondary | ICD-10-CM

## 2022-05-13 DIAGNOSIS — Z66 Do not resuscitate: Secondary | ICD-10-CM | POA: Diagnosis not present

## 2022-05-13 DIAGNOSIS — E875 Hyperkalemia: Secondary | ICD-10-CM | POA: Diagnosis present

## 2022-05-13 DIAGNOSIS — J189 Pneumonia, unspecified organism: Secondary | ICD-10-CM | POA: Diagnosis present

## 2022-05-13 DIAGNOSIS — I4821 Permanent atrial fibrillation: Secondary | ICD-10-CM | POA: Diagnosis not present

## 2022-05-13 DIAGNOSIS — Z888 Allergy status to other drugs, medicaments and biological substances status: Secondary | ICD-10-CM

## 2022-05-13 DIAGNOSIS — N1831 Chronic kidney disease, stage 3a: Secondary | ICD-10-CM | POA: Diagnosis not present

## 2022-05-13 DIAGNOSIS — I5033 Acute on chronic diastolic (congestive) heart failure: Secondary | ICD-10-CM | POA: Diagnosis not present

## 2022-05-13 DIAGNOSIS — I4891 Unspecified atrial fibrillation: Secondary | ICD-10-CM | POA: Diagnosis present

## 2022-05-13 DIAGNOSIS — M1990 Unspecified osteoarthritis, unspecified site: Secondary | ICD-10-CM | POA: Diagnosis present

## 2022-05-13 DIAGNOSIS — E785 Hyperlipidemia, unspecified: Secondary | ICD-10-CM | POA: Diagnosis present

## 2022-05-13 DIAGNOSIS — I739 Peripheral vascular disease, unspecified: Secondary | ICD-10-CM | POA: Diagnosis present

## 2022-05-13 DIAGNOSIS — Z881 Allergy status to other antibiotic agents status: Secondary | ICD-10-CM

## 2022-05-13 DIAGNOSIS — N183 Chronic kidney disease, stage 3 unspecified: Secondary | ICD-10-CM | POA: Diagnosis present

## 2022-05-13 DIAGNOSIS — I517 Cardiomegaly: Secondary | ICD-10-CM | POA: Diagnosis not present

## 2022-05-13 DIAGNOSIS — R6889 Other general symptoms and signs: Secondary | ICD-10-CM | POA: Diagnosis not present

## 2022-05-13 DIAGNOSIS — R6520 Severe sepsis without septic shock: Secondary | ICD-10-CM | POA: Diagnosis not present

## 2022-05-13 DIAGNOSIS — Z7901 Long term (current) use of anticoagulants: Secondary | ICD-10-CM | POA: Diagnosis not present

## 2022-05-13 DIAGNOSIS — E0590 Thyrotoxicosis, unspecified without thyrotoxic crisis or storm: Secondary | ICD-10-CM

## 2022-05-13 DIAGNOSIS — I5030 Unspecified diastolic (congestive) heart failure: Secondary | ICD-10-CM

## 2022-05-13 DIAGNOSIS — I499 Cardiac arrhythmia, unspecified: Secondary | ICD-10-CM | POA: Diagnosis not present

## 2022-05-13 DIAGNOSIS — Z7983 Long term (current) use of bisphosphonates: Secondary | ICD-10-CM

## 2022-05-13 DIAGNOSIS — Z79899 Other long term (current) drug therapy: Secondary | ICD-10-CM

## 2022-05-13 DIAGNOSIS — E871 Hypo-osmolality and hyponatremia: Secondary | ICD-10-CM | POA: Diagnosis present

## 2022-05-13 DIAGNOSIS — J9811 Atelectasis: Secondary | ICD-10-CM | POA: Diagnosis not present

## 2022-05-13 LAB — COMPREHENSIVE METABOLIC PANEL
ALT: 176 U/L — ABNORMAL HIGH (ref 0–44)
AST: 151 U/L — ABNORMAL HIGH (ref 15–41)
Albumin: 4.1 g/dL (ref 3.5–5.0)
Alkaline Phosphatase: 205 U/L — ABNORMAL HIGH (ref 38–126)
Anion gap: 28 — ABNORMAL HIGH (ref 5–15)
BUN: 36 mg/dL — ABNORMAL HIGH (ref 8–23)
CO2: 7 mmol/L — ABNORMAL LOW (ref 22–32)
Calcium: 9.3 mg/dL (ref 8.9–10.3)
Chloride: 90 mmol/L — ABNORMAL LOW (ref 98–111)
Creatinine, Ser: 1.68 mg/dL — ABNORMAL HIGH (ref 0.44–1.00)
GFR, Estimated: 29 mL/min — ABNORMAL LOW (ref >60)
Glucose, Bld: 166 mg/dL — ABNORMAL HIGH (ref 70–99)
Potassium: 5.7 mmol/L — ABNORMAL HIGH (ref 3.5–5.1)
Sodium: 125 mmol/L — ABNORMAL LOW (ref 135–145)
Total Bilirubin: 2.7 mg/dL — ABNORMAL HIGH (ref 0.3–1.2)
Total Protein: 7.1 g/dL (ref 6.5–8.1)

## 2022-05-13 LAB — CBC WITH DIFFERENTIAL/PLATELET
Abs Immature Granulocytes: 0.62 10*3/uL — ABNORMAL HIGH (ref 0.00–0.07)
Basophils Absolute: 0.1 10*3/uL (ref 0.0–0.1)
Basophils Relative: 0 %
Eosinophils Absolute: 0.1 10*3/uL (ref 0.0–0.5)
Eosinophils Relative: 1 %
HCT: 40.4 % (ref 36.0–46.0)
Hemoglobin: 12.6 g/dL (ref 12.0–15.0)
Immature Granulocytes: 4 %
Lymphocytes Relative: 20 %
Lymphs Abs: 3.2 10*3/uL (ref 0.7–4.0)
MCH: 33.2 pg (ref 26.0–34.0)
MCHC: 31.2 g/dL (ref 30.0–36.0)
MCV: 106.6 fL — ABNORMAL HIGH (ref 80.0–100.0)
Monocytes Absolute: 0.9 10*3/uL (ref 0.1–1.0)
Monocytes Relative: 6 %
Neutro Abs: 11.3 10*3/uL — ABNORMAL HIGH (ref 1.7–7.7)
Neutrophils Relative %: 69 %
Platelets: 167 10*3/uL (ref 150–400)
RBC: 3.79 MIL/uL — ABNORMAL LOW (ref 3.87–5.11)
RDW: 16.8 % — ABNORMAL HIGH (ref 11.5–15.5)
WBC: 16.2 10*3/uL — ABNORMAL HIGH (ref 4.0–10.5)
nRBC: 0.5 % — ABNORMAL HIGH (ref 0.0–0.2)

## 2022-05-13 LAB — RESP PANEL BY RT-PCR (RSV, FLU A&B, COVID)  RVPGX2
Influenza A by PCR: NEGATIVE
Influenza B by PCR: NEGATIVE
Resp Syncytial Virus by PCR: NEGATIVE
SARS Coronavirus 2 by RT PCR: NEGATIVE

## 2022-05-13 LAB — TROPONIN I (HIGH SENSITIVITY)
Troponin I (High Sensitivity): 17 ng/L (ref <18)
Troponin I (High Sensitivity): 16 ng/L (ref <18)

## 2022-05-13 LAB — CBG MONITORING, ED: Glucose-Capillary: 86 mg/dL (ref 70–99)

## 2022-05-13 LAB — LACTIC ACID, PLASMA
Lactic Acid, Venous: >9 mmol/L (ref 0.5–1.9)
Lactic Acid, Venous: >9 mmol/L (ref 0.5–1.9)

## 2022-05-13 LAB — BRAIN NATRIURETIC PEPTIDE: B Natriuretic Peptide: 1473.6 pg/mL — ABNORMAL HIGH (ref 0.0–100.0)

## 2022-05-13 MED ORDER — SODIUM CHLORIDE 0.9 % IV SOLN
2.0000 g | Freq: Once | INTRAVENOUS | Status: AC
  Administered 2022-05-13: 2 g via INTRAVENOUS
  Filled 2022-05-13: qty 12.5

## 2022-05-13 MED ORDER — VANCOMYCIN HCL IN DEXTROSE 1-5 GM/200ML-% IV SOLN
1000.0000 mg | Freq: Once | INTRAVENOUS | Status: AC
  Administered 2022-05-13: 1000 mg via INTRAVENOUS
  Filled 2022-05-13: qty 200

## 2022-05-13 MED ORDER — ATROPINE SULFATE 1 MG/10ML IJ SOSY
PREFILLED_SYRINGE | INTRAMUSCULAR | Status: DC | PRN
  Administered 2022-05-13: 1 mg via INTRAVENOUS

## 2022-05-13 MED ORDER — METRONIDAZOLE 500 MG/100ML IV SOLN
500.0000 mg | Freq: Once | INTRAVENOUS | Status: AC
  Administered 2022-05-13: 500 mg via INTRAVENOUS
  Filled 2022-05-13: qty 100

## 2022-05-13 MED ORDER — LACTATED RINGERS IV SOLN: INTRAVENOUS | Status: DC

## 2022-05-13 MED ORDER — LACTATED RINGERS IV BOLUS (SEPSIS)
1000.0000 mL | Freq: Once | INTRAVENOUS | Status: AC
  Administered 2022-05-13: 1000 mL via INTRAVENOUS

## 2022-05-13 MED ORDER — LACTATED RINGERS IV BOLUS (SEPSIS): 250.0000 mL | Freq: Once | INTRAVENOUS | Status: DC

## 2022-05-13 MED ORDER — CALCIUM GLUCONATE-NACL 1-0.675 GM/50ML-% IV SOLN
1.0000 g | Freq: Once | INTRAVENOUS | Status: AC
  Administered 2022-05-13: 1000 mg via INTRAVENOUS
  Filled 2022-05-13: qty 50

## 2022-05-13 MED ORDER — LACTATED RINGERS IV BOLUS (SEPSIS): 500.0000 mL | Freq: Once | INTRAVENOUS | Status: DC

## 2022-05-15 LAB — BLOOD CULTURE ID PANEL (REFLEXED) - BCID2

## 2022-05-15 NOTE — Progress Notes (Signed)
Emily Robertson, Emily Robertson ([id number]) [id number] Page 1 of 8 Visit Report for 05/09/2022 Allergy List Details Patient Name: Date of Service: PA Emily Robertson, Emily Robertson 05/09/2022 8:00 A M Medical Record Number: [id number] Patient Account Number: [id number] Date of Birth/Sex: Treating RN: [birth year] (89 y.o. [name], [name] Primary Care Keondre Markson: Juline Patch Other Clinician: Referring Cathey Fredenburg: Treating Deardra Hinkley/Extender: Chelsea Aus Weeks in Treatment: 0 Allergies Active Allergies Cozaar erythromycin base Azopt Allergy Notes Electronic Signature(s) Signed: 05/15/2022 4:24:30 PM By: Rhae Hammock RN Entered By: Rhae Hammock on 05/08/2022 08:53:48 -------------------------------------------------------------------------------- Arrival Information Details Patient Name: Date of Service: PA Emily Robertson. 05/09/2022 8:00 A M Medical Record Number: [id number] Patient Account Number: [id number] Date of Birth/Sex: Treating RN: [birth year] (89 y.o. [name], [name] Primary Care Ottie Neglia: Juline Patch Other Clinician: Referring Chuck Caban: Treating Estrella Alcaraz/Extender: Sunnie Nielsen in Treatment: 0 Visit Information Patient Arrived: Emily Robertson Time: 08:25 Accompanied By: self Transfer Assistance: None Patient Identification Verified: Yes Secondary Verification Process Completed: Yes Patient Requires Transmission-Based Precautions: No Patient Has Alerts: No Electronic Signature(s) Signed: 05/15/2022 4:24:30 PM By: Rhae Hammock RN Entered By: Rhae Hammock on 05/09/2022 08:25:56 -------------------------------------------------------------------------------- Clinic Level of Care Assessment Details Patient Name: Date of Service: PA Emily Robertson, Emily Robertson 05/09/2022 8:00 A M Medical Record Number: [id number] Patient Account Number: [id number] Date of Birth/Sex: Treating RN: 1930/08/25 (89 y.o. [name], [name] Primary Care Lerline Valdivia: Juline Patch Other Clinician: Referring Azoria Abbett: Treating Diamonte Stavely/Extender: Sunnie Nielsen in Treatment: 0 Clinic Level of Care Assessment Items TOOL 4 Quantity Score X- 1 0 Use when only an EandM is performed on FOLLOW-UP visit VIANEY, AURICH ([id number]) [phone] Page 2 of 8 ASSESSMENTS - Nursing Assessment / Reassessment X- 1 10 Reassessment of Co-morbidities (includes updates in patient status) X- 1 5 Reassessment of Adherence to Treatment Plan ASSESSMENTS - Wound and Skin A ssessment / Reassessment X - Simple Wound Assessment / Reassessment - one wound 1 5 []  - 0 Complex Wound Assessment / Reassessment - multiple wounds []  - 0 Dermatologic / Skin Assessment (not related to wound area) ASSESSMENTS - Focused Assessment X- 1 5 Circumferential Edema Measurements - multi extremities []  - 0 Nutritional Assessment / Counseling / Intervention []  - 0 Lower Extremity Assessment (monofilament, tuning fork, pulses) []  - 0 Peripheral Arterial Disease Assessment (using hand held doppler) ASSESSMENTS - Ostomy and/or Continence Assessment and Care []  - 0 Incontinence Assessment and Management []  - 0 Ostomy Care Assessment and Management (repouching, etc.) PROCESS - Coordination of Care []  - 0 Simple Patient / Family Education for ongoing care X- 1 20 Complex (extensive) Patient / Family Education for ongoing care X- 1 10 Staff obtains Programmer, systems, Robertson, T Results / Process Orders est X- 1 10 Staff telephones HHA, Nursing Homes / Clarify orders / etc []  - 0 Routine Transfer to another Facility (non-emergent condition) []  - 0 Routine Hospital Admission (non-emergent condition) X- 1 15 New Admissions / Biomedical engineer / Ordering NPWT Apligraf, etc. , []  - 0 Emergency Hospital Admission (emergent condition) []  - 0 Simple Discharge Coordination X- 1 15 Complex  (extensive) Discharge Coordination PROCESS - Special Needs []  - 0 Pediatric / Minor Patient Management []  - 0 Isolation Patient Management []  - 0 Hearing / Language / Visual special needs []  - 0 Assessment of Community assistance (transportation, D/Robertson planning, etc.) []  - 0 Additional assistance / Altered mentation []  - 0 Support Surface(s) Assessment (bed, cushion, seat, etc.)  INTERVENTIONS - Wound Cleansing / Measurement X - Simple Wound Cleansing - one wound 1 5 []  - 0 Complex Wound Cleansing - multiple wounds X- 1 5 Wound Imaging (photographs - any number of wounds) []  - 0 Wound Tracing (instead of photographs) X- 1 5 Simple Wound Measurement - one wound []  - 0 Complex Wound Measurement - multiple wounds INTERVENTIONS - Wound Dressings X - Small Wound Dressing one or multiple wounds 1 10 []  - 0 Medium Wound Dressing one or multiple wounds []  - 0 Large Wound Dressing one or multiple wounds X- 1 5 Application of Medications - topical DESHANTA, FREUND ([id number] Page 3 of 8 []  - 0 Application of Medications - injection INTERVENTIONS - Miscellaneous []  - 0 External ear exam []  - 0 Specimen Collection (cultures, biopsies, blood, body fluids, etc.) []  - 0 Specimen(s) / Culture(s) sent or taken to Lab for analysis []  - 0 Patient Transfer (multiple staff / Harrel Lemon Lift / Similar devices) []  - 0 Simple Staple / Suture removal (25 or less) []  - 0 Complex Staple / Suture removal (26 or more) []  - 0 Hypo / Hyperglycemic Management (close monitor of Blood Glucose) X- 1 15 Ankle / Brachial Index (ABI) - do not check if billed separately X- 1 5 Vital Signs Has the patient been seen at the hospital within the last three years: Yes Total Score: 145 Level Of Care: New/Established - Level 4 Electronic Signature(s) Signed: 05/15/2022 4:24:30 PM By: Rhae Hammock RN Entered By: Rhae Hammock on 05/09/2022  09:36:16 -------------------------------------------------------------------------------- Encounter Discharge Information Details Patient Name: Date of Service: PA Emily Robertson. 05/09/2022 8:00 A M Medical Record Number: [id number] Patient Account Number: [id number] Date of Birth/Sex: Treating RN: 01/29/1931 (89 y.o. [name], [name] Primary Care Sanayah Munro: Juline Patch Other Clinician: Referring Paiton Boultinghouse: Treating Konya Fauble/Extender: Sunnie Nielsen in Treatment: 0 Encounter Discharge Information Items Discharge Condition: Stable Ambulatory Status: Walker Discharge Destination: Home Transportation: Private Auto Accompanied By: self Schedule Follow-up Appointment: Yes Clinical Summary of Care: Patient Declined Electronic Signature(s) Signed: 05/15/2022 4:24:30 PM By: Rhae Hammock RN Entered By: Rhae Hammock on 05/09/2022 09:51:29 -------------------------------------------------------------------------------- Lower Extremity Assessment Details Patient Name: Date of Service: PA Emily Robertson. 05/09/2022 8:00 A M Medical Record Number: [id number] Patient Account Number: [id number] Date of Birth/Sex: Treating RN: 20-Jan-1931 (89 y.o. [name], [name] Primary Care Doil Kamara: Juline Patch Other Clinician: Referring Aubriauna Riner: Treating Parminder Trapani/Extender: Chelsea Aus Weeks in Treatment: 0 Edema Assessment Assessed: Shirlyn Goltz: Yes] [Right: No] Edema: [Left: Robertson] [Right: o] Calf Left: Right: Point of Measurement: From Medial Instep 24 cm Ankle Emily Robertson, Emily Robertson ([id number] Page 4 of 8 Left: Right: Point of Measurement: From Medial Instep 18 cm Vascular Assessment Pulses: Dorsalis Pedis Palpable: [Left:Yes] Posterior Tibial Palpable: [Left:Yes] Blood Pressure: Brachial: [Left:148] Ankle: [Left:Dorsalis Pedis: 50 0.34] Electronic Signature(s) Signed: 05/09/2022 10:46:29 AM By:  Kalman Shan DO Signed: 05/15/2022 4:24:30 PM By: Rhae Hammock RN Entered By: Kalman Shan on 05/09/2022 09:04:55 -------------------------------------------------------------------------------- Multi Wound Chart Details Patient Name: Date of Service: PA Emily Robertson. 05/09/2022 8:00 A M Medical Record Number: [id number] Patient Account Number: [id number] Date of Birth/Sex: Treating RN: 07-13-1930 (89 y.o. F) Primary Care Jaleeyah Munce: Juline Patch Other Clinician: Referring Lizmary Nader: Treating Datha Kissinger/Extender: Sunnie Nielsen in Treatment: 0 Vital Signs Height(in): Pulse(bpm): 74 Weight(lbs): Blood Pressure(mmHg): 148/73 Body Mass Index(BMI): Temperature(F): 98.7 Respiratory Rate(breaths/min): 17 [1:Photos:] [Robertson/A:Robertson/A] Left, Lateral Ankle Robertson/A Robertson/A Wound Location:  Pressure Injury Robertson/A Robertson/A Wounding Event: Pressure Ulcer Robertson/A Robertson/A Primary Etiology: Glaucoma, Hypertension, Robertson/A Robertson/A Comorbid History: Osteoarthritis 02/10/2022 Robertson/A Robertson/A Date Acquired: 0 Robertson/A Robertson/A Weeks of Treatment: Open Robertson/A Robertson/A Wound Status: No Robertson/A Robertson/A Wound Recurrence: 1.2x1.4x0.1 Robertson/A Robertson/A Measurements L x W x D (cm) 1.319 Robertson/A Robertson/A A (cm) : rea 0.132 Robertson/A Robertson/A Volume (cm) : Unstageable/Unclassified Robertson/A Robertson/A Classification: Medium Robertson/A Robertson/A Exudate A mount: Serosanguineous Robertson/A Robertson/A Exudate Type: red, brown Robertson/A Robertson/A Exudate Color: Distinct, outline attached Robertson/A Robertson/A Wound Margin: None Present (0%) Robertson/A Robertson/A Granulation A mount: Large (67-100%) Robertson/A Robertson/A Necrotic A mount: Eschar, Adherent Slough Robertson/A Robertson/A Necrotic Tissue: Fascia: No Robertson/A Robertson/A Exposed Structures: Fat Layer (Subcutaneous Tissue): No Tendon: No Muscle: No Joint: No Bone: No VAL, TIBERI ([id number] Page 5 of 8 None Robertson/A Robertson/A Epithelialization: Excoriation: No Robertson/A Robertson/A Periwound Skin Texture: Induration: No Callus: No Crepitus: No Rash: No Scarring:  No Maceration: No Robertson/A Robertson/A Periwound Skin Moisture: Dry/Scaly: No Erythema: Yes Robertson/A Robertson/A Periwound Skin Color: Atrophie Blanche: No Cyanosis: No Ecchymosis: No Hemosiderin Staining: No Mottled: No Pallor: No Rubor: No Circumferential Robertson/A Robertson/A Erythema Location: No Abnormality Robertson/A Robertson/A Temperature: Yes Robertson/A Robertson/A Tenderness on Palpation: Treatment Notes Electronic Signature(s) Signed: 05/09/2022 10:46:29 AM By: Kalman Shan DO Entered By: Kalman Shan on 05/09/2022 09:33:17 -------------------------------------------------------------------------------- Multi-Disciplinary Care Plan Details Patient Name: Date of Service: PA Emily Robertson. 05/09/2022 8:00 A M Medical Record Number: [id number] Patient Account Number: [id number] Date of Birth/Sex: Treating RN: 1930-11-13 (89 y.o. [name], [name] Primary Care Virdia Ziesmer: Juline Patch Other Clinician: Referring Ozzie Knobel: Treating Sankalp Ferrell/Extender: Chelsea Aus Weeks in Treatment: 0 Active Inactive Electronic Signature(s) Signed: 05/15/2022 4:24:30 PM By: Rhae Hammock RN Entered By: Rhae Hammock on 05/14/2022 09:12:37 -------------------------------------------------------------------------------- Pain Assessment Details Patient Name: Date of Service: PA Emily Robertson. 05/09/2022 8:00 A M Medical Record Number: [id number] Patient Account Number: [id number] Date of Birth/Sex: Treating RN: April 20, 1930 (89 y.o. [name], [name] Primary Care Javier Mamone: Juline Patch Other Clinician: Referring Kylan Liberati: Treating Siddiq Kaluzny/Extender: Sunnie Nielsen in Treatment: 0 Active Problems Location of Pain Severity and Description of Pain Patient Has Paino Yes Site Locations Pain LocationSHERRAY, Emily Robertson ([id number]) [id number] Page 6 of 8 Pain Location: Pain in Ulcers With Dressing Change: Yes Duration of the Pain. Constant / Intermittento  Intermittent Rate the pain. Current Pain Level: 6 Worst Pain Level: 10 Least Pain Level: 0 Tolerable Pain Level: 6 Character of Pain Describe the Pain: Aching Pain Management and Medication Current Pain Management: Medication: No Cold Application: No Rest: No Massage: No Activity: No T.E.Robertson.S.: No Heat Application: No Leg drop or elevation: No Is the Current Pain Management Adequate: Adequate How does your wound impact your activities of daily livingo Sleep: No Bathing: No Appetite: No Relationship With Others: No Bladder Continence: No Emotions: No Bowel Continence: No Work: No Toileting: No Drive: No Dressing: No Hobbies: No Electronic Signature(s) Signed: 05/15/2022 4:24:30 PM By: Rhae Hammock RN Entered By: Rhae Hammock on 05/09/2022 08:29:05 -------------------------------------------------------------------------------- Patient/Caregiver Education Details Patient Name: Date of Service: PA Emily Robertson, Emily Robertson. 3/28/2024andnbsp8:00 A M Medical Record Number: [id number] Patient Account Number: [id number] Date of Birth/Gender: Treating RN: 26-Mar-1930 (89 y.o. [name], [name] Primary Care Physician: Juline Patch Other Clinician: Referring Physician: Treating Physician/Extender: Sunnie Nielsen in Treatment: 0 Education Assessment Education Provided To: Patient Education Topics Provided Wound/Skin Impairment: Methods: Explain/Verbal Responses: Reinforcements needed,  State content correctly Electronic Signature(s) Signed: 05/15/2022 4:24:30 PM By: Rhae Hammock RN Entered By: Rhae Hammock on 05/09/2022 09:12:52 Skalski, Waunita Schooner ([id number] Page 7 of 8 -------------------------------------------------------------------------------- Wound Assessment Details Patient Name: Date of Service: PA Emily Robertson, Emily Robertson 05/09/2022 8:00 A M Medical Record Number: [id number] Patient Account  Number: [id number] Date of Birth/Sex: Treating RN: 1931-01-12 (89 y.o. [name], [name] Primary Care Ayomide Purdy: Juline Patch Other Clinician: Referring Verline Kong: Treating Krystalyn Kubota/Extender: Sunnie Nielsen in Treatment: 0 Wound Status Wound Number: 1 Primary Etiology: Pressure Ulcer Wound Location: Left, Lateral Ankle Wound Status: Open Wounding Event: Pressure Injury Comorbid History: Glaucoma, Hypertension, Osteoarthritis Date Acquired: 02/10/2022 Weeks Of Treatment: 0 Clustered Wound: No Photos Wound Measurements Length: (cm) 1.2 Width: (cm) 1.4 Depth: (cm) 0.1 Area: (cm) 1.319 Volume: (cm) 0.132 % Reduction in Area: % Reduction in Volume: Epithelialization: None Tunneling: No Undermining: No Wound Description Classification: Unstageable/Unclassified Wound Margin: Distinct, outline attached Exudate Amount: Medium Exudate Type: Serosanguineous Exudate Color: red, brown Foul Odor After Cleansing: No Slough/Fibrino Yes Wound Bed Granulation Amount: None Present (0%) Exposed Structure Necrotic Amount: Large (67-100%) Fascia Exposed: No Necrotic Quality: Eschar, Adherent Slough Fat Layer (Subcutaneous Tissue) Exposed: No Tendon Exposed: No Muscle Exposed: No Joint Exposed: No Bone Exposed: No Periwound Skin Texture Texture Color No Abnormalities Noted: No No Abnormalities Noted: No Callus: No Atrophie Blanche: No Crepitus: No Cyanosis: No Excoriation: No Ecchymosis: No Induration: No Erythema: Yes Rash: No Erythema Location: Circumferential Scarring: No Hemosiderin Staining: No Mottled: No Moisture Pallor: No No Abnormalities Noted: No Rubor: No Dry / Scaly: No Maceration: No Temperature / Pain Temperature: No Abnormality Tenderness on Palpation: Yes Electronic Signature(s) Signed: 05/15/2022 4:24:30 PM By: Rhae Hammock RN Sharlett Iles, Emily Robertson ([id number] Page 8 of 8 Entered By:  Rhae Hammock on 05/09/2022 08:43:54 -------------------------------------------------------------------------------- Vitals Details Patient Name: Date of Service: PA Emily Robertson, Emily Robertson 05/09/2022 8:00 A M Medical Record Number: [id number] Patient Account Number: [id number] Date of Birth/Sex: Treating RN: 03/24/1930 (89 y.o. [name], [name] Primary Care Kairi Tufo: Juline Patch Other Clinician: Referring Zilphia Kozinski: Treating Fidencio Duddy/Extender: Sunnie Nielsen in Treatment: 0 Vital Signs Time Taken: 08:25 Temperature (F): 98.7 Pulse (bpm): 74 Respiratory Rate (breaths/min): 17 Blood Pressure (mmHg): 148/73 Reference Range: 80 - 120 mg / dl Electronic Signature(s) Signed: 05/15/2022 4:24:30 PM By: Rhae Hammock RN Entered By: Rhae Hammock on 05/09/2022 08:27:21

## 2022-05-15 NOTE — Progress Notes (Signed)
Emily Robertson, STROUTH ([id number]) [id number] Page 1 of 8 Visit Report for 05/09/2022 Chief Complaint Document Details Patient Name: Date of Service: [name], [name] 05/09/2022 8:00 A M Medical Record Number: [id number] Patient Account Number: [id number] Date of Birth/Sex: Treating RN: 1930/07/02 (89 y.o. F) Primary Care Provider: Juline Patch Other Clinician: Referring Provider: Treating Provider/Extender: Sunnie Nielsen in Treatment: 0 Information Obtained from: Patient Chief Complaint 05/09/2022; left lateral ankle wound Electronic Signature(s) Signed: 05/09/2022 10:46:29 AM By: Kalman Shan DO Entered By: Kalman Shan on 05/09/2022 09:33:58 -------------------------------------------------------------------------------- HPI Details Patient Name: Date of Service: PA Emily Haff C. 05/09/2022 8:00 A M Medical Record Number: [id number] Patient Account Number: [id number] Date of Birth/Sex: Treating RN: [birth year] (89 y.o. F) Primary Care Provider: Juline Patch Other Clinician: Referring Provider: Treating Provider/Extender: Sunnie Nielsen in Treatment: 0 History of Present Illness HPI Description: 05/09/2022 Ms. Emily Robertson is a 89 year old female with a past medical history of atrial fibrillation on Eliquis and chronic diastolic heart failure that presents to the clinic for a 38-month history of nonhealing ulcer to the left lateral ankle. She is not sure how it started but thinks it was caused by friction when laying in bed. She is currently using bunny boots. She resides at friend's home. On intake wet-to-dry dressing was taken off. According the patient the facility is using an ointment. She currently denies signs of infection. ABI in office was 0.34 on the left. Electronic Signature(s) Signed: 05/09/2022 10:46:29 AM By: Kalman Shan DO Entered By: Kalman Shan on 05/09/2022  09:36:22 -------------------------------------------------------------------------------- Physical Exam Details Patient Name: Date of Service: PA Emily Haff C. 05/09/2022 8:00 A M Medical Record Number: [id number] Patient Account Number: [id number] Date of Birth/Sex: Treating RN: 1930/10/16 (89 y.o. F) Primary Care Provider: Juline Patch Other Clinician: Referring Provider: Treating Provider/Extender: Sunnie Nielsen in Treatment: 0 Constitutional respirations regular, non-labored and within target range for patient.Marland Kitchen Psychiatric pleasant and cooperative. Notes Left lateral ankle with an open wound with nonviable surface. Difficult to palpate pedal pulses. No obvious signs of active infection including increased warmth, erythema or purulent drainage. Electronic Signature(s) [name], [name] ([id number]) [id number] Page 2 of 8 Signed: 05/09/2022 10:46:29 AM By: Kalman Shan DO Entered By: Kalman Shan on 05/09/2022 09:37:20 -------------------------------------------------------------------------------- Physician Orders Details Patient Name: Date of Service: PA Emily Haff C. 05/09/2022 8:00 A M Medical Record Number: [id number] Patient Account Number: [id number] Date of Birth/Sex: Treating RN: 21-May-1930 (89 y.o. [name], [name] Primary Care Provider: Juline Patch Other Clinician: Referring Provider: Treating Provider/Extender: Sunnie Nielsen in Treatment: 0 Verbal / Phone Orders: No Diagnosis Coding Follow-up Appointments ppointment in 2 weeks. - w/ Dr. Heber O'Kean and Allayne Butcher Rm # 9 on Thursday 05/23/22 @ 10:15 Return A Anesthetic (In clinic) Topical Lidocaine 5% applied to wound bed Bathing/ Shower/ Hygiene May shower with protection but do not get wound dressing(s) wet. Protect dressing(s) with water repellant cover (for example, large plastic bag) or a cast cover and may then  take shower. Edema Control - Lymphedema / SCD / Other Elevate legs to the level of the heart or above for 30 minutes daily and/or when sitting for 3-4 times a day throughout the day. Avoid standing for long periods of time. Off-Loading Prevalon Boot - put bunny boots on feet while in the bed Wound Treatment Wound #1 - Ankle Wound Laterality: Left, Lateral Cleanser: Wound Cleanser 1 x  Per Day/30 Days Discharge Instructions: Cleanse the wound with wound cleanser prior to applying a clean dressing using gauze sponges, not tissue or cotton balls. Peri-Wound Care: Skin Prep 1 x Per Day/30 Days Discharge Instructions: Use skin prep as directed Prim Dressing: Hydrofera Blue Ready Transfer Foam, 2.5x2.5 (in/in) 1 x Per Day/30 Days ary Discharge Instructions: Apply directly to wound bed as directed Prim Dressing: MediHoney Gel, tube 1.5 (oz) 1 x Per Day/30 Days ary Discharge Instructions: Apply to wound bed as instructed Secondary Dressing: Zetuvit Plus Silicone Border Dressing 4x4 (in/in) 1 x Per Day/30 Days Discharge Instructions: Apply silicone border over primary dressing as directed. Consults Vascular - Abnormal ABI's in clinic and non-healing pressure ulcer on left ankle Radiology X-ray, ankle - left ankle; looking for infection/osteomyelitis Patient Medications llergies: Cozaar, erythromycin base, Azopt A Notifications Medication Indication Start End lidocaine DOSE topical 5 % gel - gel topical once daily Electronic Signature(s) Signed: 05/09/2022 10:46:29 AM By: Kalman Shan DO Entered By: Kalman Shan on 05/09/2022 09:40:24 [name], [name] ([id number]) [id number] Page 3 of 8 Prescription 05/09/2022 -------------------------------------------------------------------------------- [name], [name] Kalman Shan DO Patient Name: Provider: [birth year] [id number] Date of Birth: NPI#: [id number] Sex: DEA #: [phone] Phone #:  License #: [place] Patient Address: [address] [place], [place] [address] [place], [place] [address] [phone] Allergies Cozaar; erythromycin base; Azopt Provider's Orders X-ray, ankle - left ankle; looking for infection/osteomyelitis Hand Signature: Date(s): Prescription 05/09/2022 [name], [name] Kalman Shan DO Patient Name: Provider: Jul 08, 1930 [id number] Date of Birth: NPI#: [id number] Sex: DEA #: [phone] Phone #: License #: [place] Patient Address: [address], [place] [address] [place],  [address] [phone] Allergies Cozaar; erythromycin base; Azopt Provider's Orders Vascular - Abnormal ABI's in clinic and non-healing pressure ulcer on left ankle Hand Signature: Date(s): Electronic Signature(s) Signed: 05/09/2022 10:46:29 AM By: Kalman Shan DO Entered By: Kalman Shan on 05/09/2022 09:40:25 -------------------------------------------------------------------------------- Problem List Details Patient Name: Date of Service: PA Lyn Records. 05/09/2022 8:00 A M Medical Record Number: [id number] Patient Account Number: [id number] Date of Birth/Sex: Treating RN: August 21, 1930 (89 y.o. F) Primary Care Provider: Juline Patch Other Clinician: Referring Provider: Treating Provider/Extender: Sunnie Nielsen in Treatment: 0 Active Problems ICD-10 Encounter Code Description Active Date MDM Diagnosis L89.520 Pressure ulcer of left ankle, unstageable 05/09/2022 No Yes I48.20 Chronic atrial fibrillation, unspecified 05/09/2022 No Yes QUENTASIA, STMARIE ([id number]) [phone].pdf Page 4 of 8 [id number] Chronic diastolic (congestive) heart failure 05/09/2022 No Yes Inactive Problems Resolved Problems Electronic Signature(s) Signed:  05/09/2022 10:46:29 AM By: Kalman Shan DO Entered By: Kalman Shan on 05/09/2022 09:33:12 -------------------------------------------------------------------------------- Progress Note Details Patient Name: Date of Service: PA Lyn Records. 05/09/2022 8:00 A M Medical Record Number: [id number] Patient Account Number: [id number] Date of Birth/Sex: Treating RN: Oct 28, 1930 (89 y.o. F) Primary Care Provider: Juline Patch Other Clinician: Referring Provider: Treating Provider/Extender: Sunnie Nielsen in Treatment: 0 Subjective Chief Complaint Information obtained from Patient 05/09/2022; left lateral ankle wound History of Present Illness (HPI) 05/09/2022 Ms. Emily Robertson is a 89 year old female with a past medical history of atrial fibrillation on Eliquis and chronic diastolic heart failure that presents to the clinic for a 29-month history of nonhealing ulcer to the left lateral ankle. She is not sure how it started but thinks it  was caused by friction when laying in bed. She is currently using bunny boots. She resides at friend's home. On intake wet-to-dry dressing was taken off. According the patient the facility is using an ointment. She currently denies signs of infection. ABI in office was 0.34 on the left. Patient History Information obtained from Patient, Chart. Allergies Cozaar, erythromycin base, Azopt Family History Unknown History. Social History Never smoker, Marital Status - Widowed, Alcohol Use - Never, Drug Use - No History, Caffeine Use - Rarely. Medical History Eyes Patient has history of Glaucoma Cardiovascular Patient has history of Hypertension Musculoskeletal Patient has history of Osteoarthritis Hospitalization/Surgery History - right/left hearth cath and coronary angiography. - TEE without cardioversion. - pubovaginal sling. - retinal detachment surgery. Medical A Surgical History  Notes nd Cardiovascular hyperlipidemia Review of Systems (ROS) Constitutional Symptoms (General Health) Denies complaints or symptoms of Fatigue, Fever, Chills, Marked Weight Change. Ear/Nose/Mouth/Throat Denies complaints or symptoms of Chronic sinus problems or rhinitis. Respiratory Denies complaints or symptoms of Chronic or frequent coughs, Shortness of Breath. Gastrointestinal pelvic prolapse Endocrine Denies complaints or symptoms of Heat/cold intolerance. Genitourinary urgency of urination, urinary hesitancy, urticaria Integumentary (Skin) FLETCHER, SACCOMANNO ([id number]) [id number].pdf Page 5 of 8 Complains or has symptoms of Wounds. Musculoskeletal Denies complaints or symptoms of Muscle Pain, Muscle Weakness. Neurologic Denies complaints or symptoms of Numbness/parasthesias. Psychiatric Denies complaints or symptoms of Claustrophobia. Objective Constitutional respirations regular, non-labored and within target range for patient.. Vitals Time Taken: 8:25 AM, Temperature: 98.7 F, Pulse: 74 bpm, Respiratory Rate: 17 breaths/min, Blood Pressure: 148/73 mmHg. Psychiatric pleasant and cooperative. General Notes: Left lateral ankle with an open wound with nonviable surface. Difficult to palpate pedal pulses. No obvious signs of active infection including increased warmth, erythema or purulent drainage. Integumentary (Hair, Skin) Wound #1 status is Open. Original cause of wound was Pressure Injury. The date acquired was: 02/10/2022. The wound is located on the Left,Lateral Ankle. The wound measures 1.2cm length x 1.4cm width x 0.1cm depth; 1.319cm^2 area and 0.132cm^3 volume. There is no tunneling or undermining noted. There is a medium amount of serosanguineous drainage noted. The wound margin is distinct with the outline attached to the wound base. There is no granulation within the wound bed. There is a large (67-100%) amount of necrotic tissue  within the wound bed including Eschar and Adherent Slough. The periwound skin appearance exhibited: Erythema. The periwound skin appearance did not exhibit: Callus, Crepitus, Excoriation, Induration, Rash, Scarring, Dry/Scaly, Maceration, Atrophie Blanche, Cyanosis, Ecchymosis, Hemosiderin Staining, Mottled, Pallor, Rubor. The surrounding wound skin color is noted with erythema which is circumferential. Periwound temperature was noted as No Abnormality. The periwound has tenderness on palpation. Assessment Active Problems ICD-10 Pressure ulcer of left ankle, unstageable Chronic atrial fibrillation, unspecified Chronic diastolic (congestive) heart failure Patient presents with a 49-month history of nonhealing ulcer to the left lateral ankle secondary to pressure. I recommended an x-ray as she has no imaging on file. Due to her ABIs in office also recommended a consult to vein and vascular for further blood flow assessment. For now I recommended Medihoney and Hydrofera Blue daily to the wound bed. Continue to wear bunny boots while in bed. Follow-up in 2 weeks. Plan Follow-up Appointments: Return Appointment in 2 weeks. - w/ Dr. Heber Lindale and Allayne Butcher Rm # 9 on Thursday 05/23/22 @ 10:15 Anesthetic: (In clinic) Topical Lidocaine 5% applied to wound bed Bathing/ Shower/ Hygiene: May shower with protection but do not get wound dressing(s) wet. Protect dressing(s) with water repellant cover (for  example, large plastic bag) or a cast cover and may then take shower. Edema Control - Lymphedema / SCD / Other: Elevate legs to the level of the heart or above for 30 minutes daily and/or when sitting for 3-4 times a day throughout the day. Avoid standing for long periods of time. Off-Loading: Prevalon Boot - put bunny boots on feet while in the bed Radiology ordered were: X-ray, ankle - left ankle; looking for infection/osteomyelitis Consults ordered were: Vascular - Abnormal ABI's in clinic and  non-healing pressure ulcer on left ankle The following medication(s) was prescribed: lidocaine topical 5 % gel gel topical once daily was prescribed at facility WOUND #1: - Ankle Wound Laterality: Left, Lateral Cleanser: Wound Cleanser 1 x Per Day/30 Days Discharge Instructions: Cleanse the wound with wound cleanser prior to applying a clean dressing using gauze sponges, not tissue or cotton balls. Peri-Wound Care: Skin Prep 1 x Per Day/30 Days Discharge Instructions: Use skin prep as directed Prim Dressing: Hydrofera Blue Ready Transfer Foam, 2.5x2.5 (in/in) 1 x Per Day/30 Days TONNETTE, ZATORSKI ([id number]) [id number] Page 6 of 8 Discharge Instructions: Apply directly to wound bed as directed Prim Dressing: MediHoney Gel, tube 1.5 (oz) 1 x Per Day/30 Days ary Discharge Instructions: Apply to wound bed as instructed Secondary Dressing: Zetuvit Plus Silicone Border Dressing 4x4 (in/in) 1 x Per Day/30 Days Discharge Instructions: Apply silicone border over primary dressing as directed. 1. Medihoney and Hydrofera Blue 2. X-ray of the left foot 3. Vein and vascular referral 4. Aggressive offloadingoobunny boots 5. Follow-up in 2 weeks Electronic Signature(s) Signed: 05/09/2022 10:46:29 AM By: Kalman Shan DO Entered By: Kalman Shan on 05/09/2022 09:42:05 -------------------------------------------------------------------------------- HxROS Details Patient Name: Date of Service: PA Emily Haff C. 05/09/2022 8:00 A M Medical Record Number: [id number] Patient Account Number: [id number] Date of Birth/Sex: Treating RN: [birth year] (89 y.o. [name], [name] Primary Care Provider: Juline Patch Other Clinician: Referring Provider: Treating Provider/Extender: Sunnie Nielsen in Treatment: 0 Information Obtained From Patient Chart Constitutional Symptoms (General Health) Complaints and Symptoms: Negative for: Fatigue;  Fever; Chills; Marked Weight Change Ear/Nose/Mouth/Throat Complaints and Symptoms: Negative for: Chronic sinus problems or rhinitis Respiratory Complaints and Symptoms: Negative for: Chronic or frequent coughs; Shortness of Breath Endocrine Complaints and Symptoms: Negative for: Heat/cold intolerance Integumentary (Skin) Complaints and Symptoms: Positive for: Wounds Musculoskeletal Complaints and Symptoms: Negative for: Muscle Pain; Muscle Weakness Medical History: Positive for: Osteoarthritis Neurologic Complaints and Symptoms: Negative for: Numbness/parasthesias Psychiatric Complaints and Symptoms: Negative for: Claustrophobia Eyes KHLOIE, PHILBROOK ([id number]) [id number] Page 7 of 8 Medical History: Positive for: Glaucoma Hematologic/Lymphatic Cardiovascular Medical History: Positive for: Hypertension Past Medical History Notes: hyperlipidemia Gastrointestinal Complaints and Symptoms: Review of System Notes: pelvic prolapse Genitourinary Complaints and Symptoms: Review of System Notes: urgency of urination, urinary hesitancy, urticaria Immunological Oncologic HBO Extended History Items Eyes: Glaucoma Immunizations Pneumococcal Vaccine: Received Pneumococcal Vaccination: Yes Received Pneumococcal Vaccination On or After 60th Birthday: Yes Implantable Devices None Hospitalization / Surgery History Type of Hospitalization/Surgery right/left hearth cath and coronary angiography TEE without cardioversion pubovaginal sling retinal detachment surgery Family and Social History Unknown History: Yes; Never smoker; Marital Status - Widowed; Alcohol Use: Never; Drug Use: No History; Caffeine Use: Rarely; Financial Concerns: No; Food, Clothing or Shelter Needs: No; Support System Lacking: No; Transportation Concerns: No Electronic Signature(s) Signed: 05/09/2022 10:46:29 AM By: Kalman Shan DO Signed: 05/15/2022 4:24:30 PM By:  Rhae Hammock RN Entered By: Rhae Hammock on 05/08/2022 08:57:21 -------------------------------------------------------------------------------- SuperBill Details  Patient Name: Date of Service: PA CHERELL, MARKE 05/09/2022 Medical Record Number: [id number] Patient Account Number: [id number] Date of Birth/Sex: Treating RN: 06/03/1930 (89 y.o. [name], [name] Primary Care Provider: Juline Patch Other Clinician: Referring Provider: Treating Provider/Extender: Chelsea Aus Weeks in Treatment: 0 Diagnosis Coding ICD-10 Codes Code Description MALAJAH, KADRMAS ([id number]) [id number] Page 8 of 8 L89.520 Pressure ulcer of left ankle, unstageable I48.20 Chronic atrial fibrillation, unspecified [id number] Chronic diastolic (congestive) heart failure Facility Procedures : CPT4 Code: [id number] Description: [id number] - WOUND CARE VISIT-LEV 4 EST PT Modifier: Quantity: 1 Physician Procedures : CPT4 Code Description Modifier [id number] - WC PHYS LEVEL 4 - NEW PT ICD-10 Diagnosis Description L89.520 Pressure ulcer of left ankle, unstageable I48.20 Chronic atrial fibrillation, unspecified [id number] Chronic diastolic (congestive) heart failure Quantity: 1 Electronic Signature(s) Signed: 05/09/2022 10:46:29 AM By: Kalman Shan DO Entered By: Kalman Shan on 05/09/2022 09:42:26

## 2022-05-15 NOTE — Progress Notes (Signed)
Emily Robertson, Emily Robertson ([id number]) [phone].pdf Page 1 of 4 Visit Report for 05/09/2022 Abuse Risk Screen Details Patient Name: Date of Service: PA Emily Robertson, Emily Robertson 05/09/2022 8:00 A M Medical Record Number: [id number] Patient Account Number: [id number] Date of Birth/Sex: Treating RN: 06-28-1930 (89 y.o. [name], [name] Primary Care Nikole Swartzentruber: Juline Patch Other Clinician: Referring Kiondra Caicedo: Treating Sheneka Schrom/Extender: Sunnie Nielsen in Treatment: 0 Abuse Risk Screen Items Answer ABUSE RISK SCREEN: Has anyone close to you tried to hurt or harm you recentlyo No Do you feel uncomfortable with anyone in your familyo No Has anyone forced you do things that you didnt want to doo No Electronic Signature(s) Signed: 05/15/2022 4:24:30 PM By: Rhae Hammock RN Entered By: Rhae Hammock on 05/09/2022 08:27:35 -------------------------------------------------------------------------------- Activities of Daily Living Details Patient Name: Date of Service: PA Emily Robertson, Emily Robertson 05/09/2022 8:00 A M Medical Record Number: [id number] Patient Account Number: [id number] Date of Birth/Sex: Treating RN: [birth year] (89 y.o. [name], [name] Primary Care Jamarcus Laduke: Juline Patch Other Clinician: Referring Kingdom Vanzanten: Treating Aritzel Krusemark/Extender: Sunnie Nielsen in Treatment: 0 Activities of Daily Living Items Answer Activities of Daily Living (Please select one for each item) Drive Automobile Not Able T Medications ake Need Assistance Use T elephone Need Assistance Care for Appearance Need Assistance Use T oilet Need Assistance Bath / Shower Need Assistance Dress Self Need Assistance Feed Self Completely Able Walk Need Assistance Get In / Out Bed Need Assistance Housework Need Assistance Prepare Meals Need Assistance Handle Money Need Assistance Shop for Self Need Assistance Electronic  Signature(s) Signed: 05/15/2022 4:24:30 PM By: Rhae Hammock RN Entered By: Rhae Hammock on 05/09/2022 08:27:55 -------------------------------------------------------------------------------- Education Screening Details Patient Name: Date of Service: PA Emily Records. 05/09/2022 8:00 A M Medical Record Number: [id number] Patient Account Number: [id number] Date of Birth/Sex: Treating RN: 1930-09-06 (89 y.o. [name], [name] Primary Care Dustie Brittle: Juline Patch Other Clinician: Referring Tanvi Gatling: Treating Tyresha Fede/Extender: Sunnie Nielsen in Treatment: [address] Emily Robertson, Emily Robertson ([id number]) [id number] Page 2 of 4 Primary Learner Assessed: Patient Learning Preferences/Education Level/Primary Language Learning Preference: Explanation, Demonstration, Communication Board, Printed Material Highest Education Level: High School Preferred Language: Diplomatic Services operational officer Language Barrier: No Translator Needed: No Memory Deficit: No Emotional Barrier: No Cultural/Religious Beliefs Affecting Medical Care: No Physical Barrier Impaired Vision: Yes Glasses Impaired Hearing: No Decreased Hand dexterity: No Knowledge/Comprehension Knowledge Level: High Comprehension Level: High Ability to understand written instructions: High Ability to understand verbal instructions: High Motivation Anxiety Level: Calm Cooperation: Cooperative Education Importance: Denies Need Interest in Health Problems: Asks Questions Perception: Coherent Willingness to Engage in Self-Management High Activities: Readiness to Engage in Self-Management High Activities: Electronic Signature(s) Signed: 05/15/2022 4:24:30 PM By: Rhae Hammock RN Entered By: Rhae Hammock on 05/09/2022 08:28:13 -------------------------------------------------------------------------------- Fall Risk Assessment Details Patient Name: Date of Service: PA Emily Records. 05/09/2022 8:00 A M Medical Record Number: [id number] Patient Account Number: [id number] Date of Birth/Sex: Treating RN: 29-Jan-1931 (89 y.o. [name], [name] Primary Care Carolie Mcilrath: Juline Patch Other Clinician: Referring Lillyanne Bradburn: Treating Romona Murdy/Extender: Sunnie Nielsen in Treatment: 0 Fall Risk Assessment Items Have you had 2 or more falls in the last 12 monthso 0 No Have you had any fall that resulted in injury in the last 12 monthso 0 No FALLS RISK SCREEN History of falling - immediate or within 3 months 0 No Secondary diagnosis (Do you have 2 or more medical  diagnoseso) 0 No Ambulatory aid None/bed rest/wheelchair/nurse 0 No Crutches/cane/walker 0 No Furniture 0 No Intravenous therapy Access/Saline/Heparin Lock 0 No Gait/Transferring Normal/ bed rest/ wheelchair 0 No Weak (short steps with or without shuffle, stooped but able to lift head while walking, may seek 0 No support from furniture) Impaired (short steps with shuffle, may have difficulty arising from chair, head down, impaired 0 No balance) Mental Status Oriented to own ability 0 No Overestimates or forgets limitations 0 No Risk Level: Low Risk Score: 0 Highbaugh, Jorgina C ([id number]) [id number] Page 3 of 4 Electronic Signature(s) -------------------------------------------------------------------------------- Foot Assessment Details Patient Name: Date of Service: PA Emily Robertson, Emily Robertson 05/09/2022 8:00 A M Medical Record Number: [id number] Patient Account Number: [id number] Date of Birth/Sex: Treating RN: January 10, 1931 (89 y.o. [name], [name] Primary Care Lamberto Dinapoli: Juline Patch Other Clinician: Referring Denarius Sesler: Treating Patton Rabinovich/Extender: Sunnie Nielsen in Treatment: 0 Foot Assessment Items Site Locations + = Sensation present, - = Sensation absent, C = Callus, U = Ulcer R = Redness, W = Warmth, M =  Maceration, PU = Pre-ulcerative lesion F = Fissure, S = Swelling, D = Dryness Assessment Right: Left: Other Deformity: No No Prior Foot Ulcer: No No Prior Amputation: No No Charcot Joint: No No Ambulatory Status: Ambulatory With Help Assistance Device: Walker Gait: Steady Electronic Signature(s) Signed: 05/15/2022 4:24:30 PM By: Rhae Hammock RN Entered By: Rhae Hammock on 05/09/2022 08:35:02 -------------------------------------------------------------------------------- Nutrition Risk Screening Details Patient Name: Date of Service: PA Emily Robertson, Emily Robertson 05/09/2022 8:00 A M Medical Record Number: [id number] Patient Account Number: [id number] Date of Birth/Sex: Treating RN: 1930-07-14 (89 y.o. [name], [name] Primary Care Rawlin Reaume: Juline Patch Other Clinician: Referring Eddrick Dilone: Treating Cyruss Arata/Extender: Sunnie Nielsen in Treatment: 0 Height (in): Weight (lbs): Body Mass Index (BMI): Emily Robertson, Emily Robertson ([id number]) [phone] [id number] Page 4 of 4 Nutrition Risk Screening Items Score Screening NUTRITION RISK SCREEN: I have an illness or condition that made me change the kind and/or amount of food I eat 0 No I eat fewer than two meals per day 0 No I eat few fruits and vegetables, or milk products 0 No I have three or more drinks of beer, liquor or wine almost every day 0 No I have tooth or mouth problems that make it hard for me to eat 0 No I don't always have enough money to buy the food I need 0 No I eat alone most of the time 0 No I take three or more different prescribed or over-the-counter drugs a day 0 No Without wanting to, I have lost or gained 10 pounds in the last six months 0 No I am not always physically able to shop, cook and/or feed myself 0 No Nutrition Protocols Good Risk Protocol 0 No interventions needed Moderate Risk Protocol High Risk Proctocol Risk Level: Good Risk Score: 0 Electronic  Signature(s) Signed: 05/15/2022 4:24:30 PM By: Rhae Hammock RN Entered By: Rhae Hammock on 05/09/2022 [id number]

## 2022-05-16 LAB — CULTURE, BLOOD (ROUTINE X 2): Special Requests: ADEQUATE

## 2022-05-18 LAB — CULTURE, BLOOD (ROUTINE X 2): Culture: NO GROWTH

## 2022-05-23 ENCOUNTER — Encounter (HOSPITAL_BASED_OUTPATIENT_CLINIC_OR_DEPARTMENT_OTHER): Payer: Medicare PPO | Admitting: Internal Medicine

## 2022-06-06 ENCOUNTER — Ambulatory Visit (HOSPITAL_BASED_OUTPATIENT_CLINIC_OR_DEPARTMENT_OTHER): Payer: Medicare PPO

## 2022-06-12 NOTE — Code Documentation (Signed)
Patient time of death occurred at 68.

## 2022-06-12 NOTE — Assessment & Plan Note (Signed)
Diuresis once stable

## 2022-06-12 NOTE — Assessment & Plan Note (Signed)
MVP, 01/11/22 Echo EF 60-65%,  on Furosemide, Spironolactone

## 2022-06-12 NOTE — Sepsis Progress Note (Signed)
Notified bedside nurse of need to draw repeat lactic acid.  Therapist, sports)

## 2022-06-12 NOTE — Assessment & Plan Note (Signed)
SOB, central congestion, negative COVID, denied cough, chest pain, palpitation, or fever. Will obtain CXR ap/lateral, CBC/diff, CMP/eGFR, DuoNeb, may consider empirical ABT if wbc is elevated.

## 2022-06-12 NOTE — Death Summary Note (Signed)
DEATH SUMMARY   Patient Details  Name: Emily Robertson MRN: [id number] DOB: 31-Aug-1930 [id number], [name], FNP Admission/Discharge Information   Admit Date:  05/15/22  Date of Death:    Time of Death:    Length of Stay: 0   Principle Cause of death: Sepsis  Hospital Diagnoses: Principal Problem:   Sepsis Active Problems:   HTN (hypertension)   Hyperthyroidism   Pure hypercholesterolemia   Acute exacerbation of CHF (congestive heart failure)   Elevated LFTs   Atrial fibrillation with RVR   Protein-calorie malnutrition, severe   CKD (chronic kidney disease) stage 3, GFR 30-59 ml/min   Hyperlipidemia   PVD (peripheral vascular disease)   Hospital Course: Patient is a 89 year old female with past medical history significant for severe mitral valve regurg which is inoperable, HTN, HLD, with CHF related to the mitral valve.  The patient had increasing shortness of breath and work of breathing on yesterday at her SNF and was started on azithromycin and had a chest x-ray done. Patient was seen in the ED where she was found to have multisystem organ failure and significant acidosis with a bicarb of 7 and anion gap of 28 and a lactic acid greater than 9. Sepsis protocol was begun and the patient was placed on BiPAP and we were asked to admit. After lengthy discussion with the son we agreed to continue BiPAP aggressive IV fluid hydration and IV antibiotics.  The second lactic acid came back greater than 9 and the patient became increasingly bradycardic.  The EDP gave 1 dose of atropine but the patient ultimately succumbed to overwhelming sepsis.  Assessment and Plan: * Sepsis Suspect pneumonia though chest x-ray is negative.  Given severity of sepsis Long conversation was had with the son around comfort measures versus escalation of care including ICU and pressor support.  He was in agreement this is probably not the best course of action.  He wanted [place] to continue with BiPAP  aggressive fluid resuscitation and IV antibiotics for now. Despite aggressive treatment patient ultimately succumbed to this diagnosis.  CKD (chronic kidney disease) stage 3, GFR 30-59 ml/min Creatinine of 1.68 Aggressive fluid resuscitation Avoid nephrotoxic agents  Atrial fibrillation with RVR Continue meds as needed  Elevated LFTs Indicative of sepsis  Acute exacerbation of CHF (congestive heart failure) Diuresis once stable  Pure hypercholesterolemia Continue statin  Hyperthyroidism Continue thyroid replacement  HTN (hypertension) Not needed due to hypotension    Procedures: None  Consultations: None  The results of significant diagnostics from this hospitalization (including imaging, microbiology, ancillary and laboratory) are listed below for reference.   Significant Diagnostic Studies: DG Chest Port 1 View  Result Date: 05-15-22 CLINICAL DATA:  Shortness of EXAM: PORTABLE CHEST 1 VIEW COMPARISON:  Radiograph 01/11/2022 FINDINGS: Unchanged cardiomegaly. Skin folds overlying the right upper chest. No focal airspace consolidation. No large effusion or evidence of pneumothorax. Bilateral shoulder degenerative changes. Thoracic spondylosis. No acute osseous abnormality. IMPRESSION: Unchanged cardiomegaly.  No focal airspace consolidation. Electronically Signed   By: Maurine Simmering M.D.   On: 2022/05/15 15:37    Microbiology: Recent Results (from the past 240 hour(s))  Resp panel by RT-PCR (RSV, Flu A&B, Covid) Anterior Nasal Swab     Status: None   Collection Time: 15-May-2022  3:18 PM   Specimen: Anterior Nasal Swab  Result Value Ref Range Status   SARS Coronavirus 2 by RT PCR NEGATIVE NEGATIVE Final    Comment: (NOTE) SARS-CoV-2 target nucleic acids are NOT DETECTED.  The SARS-CoV-2 RNA is generally detectable in upper respiratory specimens during the acute phase of infection. The lowest concentration of SARS-CoV-2 viral copies this assay can detect is 138  copies/mL. A negative result does not preclude SARS-Cov-2 infection and should not be used as the sole basis for treatment or other patient management decisions. A negative result may occur with  improper specimen collection/handling, submission of specimen other than nasopharyngeal swab, presence of viral mutation(s) within the areas targeted by this assay, and inadequate number of viral copies(<138 copies/mL). A negative result must be combined with clinical observations, patient history, and epidemiological information. The expected result is Negative.  Fact Sheet for Patients:  EntrepreneurPulse.com.au  Fact Sheet for Healthcare Providers:  IncredibleEmployment.be  This test is no t yet approved or cleared by the [place] FDA and  has been authorized for detection and/or diagnosis of SARS-CoV-2 by FDA under an Emergency Use Authorization (EUA). This EUA will remain  in effect (meaning this test can be used) for the duration of the COVID-19 declaration under Section 564(b)(1) of the Act, 21 U.S.C.section 360bbb-3(b)(1), unless the authorization is terminated  or revoked sooner.       Influenza A by PCR NEGATIVE NEGATIVE Final   Influenza B by PCR NEGATIVE NEGATIVE Final    Comment: (NOTE) The Xpert Xpress SARS-CoV-2/FLU/RSV plus assay is intended as an aid in the diagnosis of influenza from Nasopharyngeal swab specimens and should not be used as a sole basis for treatment. Nasal washings and aspirates are unacceptable for Xpert Xpress SARS-CoV-2/FLU/RSV testing.  Fact Sheet for Patients: EntrepreneurPulse.com.au  Fact Sheet for Healthcare Providers: IncredibleEmployment.be  This test is not yet approved or cleared by the [place] FDA and has been authorized for detection and/or diagnosis of SARS-CoV-2 by FDA under an Emergency Use Authorization (EUA). This EUA will remain in effect (meaning  this test can be used) for the duration of the COVID-19 declaration under Section 564(b)(1) of the Act, 21 U.S.C. section 360bbb-3(b)(1), unless the authorization is terminated or revoked.     Resp Syncytial Virus by PCR NEGATIVE NEGATIVE Final    Comment: (NOTE) Fact Sheet for Patients: EntrepreneurPulse.com.au  Fact Sheet for Healthcare Providers: IncredibleEmployment.be  This test is not yet approved or cleared by the [place] FDA and has been authorized for detection and/or diagnosis of SARS-CoV-2 by FDA under an Emergency Use Authorization (EUA). This EUA will remain in effect (meaning this test can be used) for the duration of the COVID-19 declaration under Section 564(b)(1) of the Act, 21 U.S.C. section 360bbb-3(b)(1), unless the authorization is terminated or revoked.  Performed at [facility], [place] [address], [place], [place] [address]   Culture, blood (Routine X 2) w Reflex to ID Panel     Status: None (Preliminary result)   Collection Time: May 15, 2022  3:23 PM   Specimen: BLOOD LEFT ARM  Result Value Ref Range Status   Specimen Description   Final    BLOOD LEFT ARM Performed at [facility] [address] [place], [place] [address]    Special Requests   Final    BOTTLES DRAWN AEROBIC AND ANAEROBIC Blood Culture adequate volume Performed at [facility] [address] [place], [place] [id number]    Culture PENDING  Incomplete   Report Status PENDING  Incomplete    Time spent: 30 minutes  Signed: Donnamae Jude, MD 15-May-2022

## 2022-06-12 NOTE — Progress Notes (Signed)
A consult was received from an ED physician for cefepime and vancomycin per pharmacy dosing.  The patient's profile has been reviewed for ht/wt/allergies/indication/available labs.    A one time order has been placed for cefepime 2 g IV once + vancomycin 1000 mg IV once.    Further antibiotics/pharmacy consults should be ordered by admitting physician if indicated.                       Thank you, Lenis Noon, PharmD June 01, 2022  4:30 PM

## 2022-06-12 NOTE — Assessment & Plan Note (Addendum)
Not needed due to hypotension

## 2022-06-12 NOTE — ED Notes (Signed)
Spoke to patients son on the phone and he is not coming to ER. He informed me that the funeral home will be [place] [phone]. Patients account at [facility] [id number]. JRPRN

## 2022-06-12 NOTE — Assessment & Plan Note (Signed)
Continue statin. 

## 2022-06-12 NOTE — Assessment & Plan Note (Signed)
Bun/creat 30/1.27 03/26/22 

## 2022-06-12 NOTE — Assessment & Plan Note (Signed)
Indicative of sepsis

## 2022-06-12 NOTE — Code Documentation (Signed)
1mg  atropine given per Verbal order from Dr. [name].

## 2022-06-12 NOTE — Assessment & Plan Note (Signed)
Reduced Methimazole 5mg q8hrs from 10mg bid 03/26/22, TSH 0.020 07/26/21<<24 03/26/22, repeat TSH 6 weeks from 03/26/22 

## 2022-06-12 NOTE — Assessment & Plan Note (Signed)
Blood pressure is controlled

## 2022-06-12 NOTE — Assessment & Plan Note (Signed)
Continue meds as needed.

## 2022-06-12 NOTE — Assessment & Plan Note (Signed)
Continue thyroid replacement. 

## 2022-06-12 NOTE — H&P (Addendum)
History and Physical    Patient: Emily Robertson [id number] DOB: 08/01/1930 DOA: 05-30-2022 DOS: the patient was seen and examined on 30-May-2022 PCP: [name], FNP  Patient coming from: SNF  Chief Complaint:  Chief Complaint  Patient presents with   Shortness of Breath   HPI: Emily Robertson is a 89 y.o. female with medical history significant of  severe mitral valve regurg which is inoperable, HTN, HLD, with CHF related to the mitral valve.  The patient had increasing shortness of breath and work of breathing on yesterday at her SNF and was started on azithromycin and had a chest x-ray done. Patient was seen in the ED where she was found to have multisystem organ failure and significant acidosis with a bicarb of 7 and anion gap of 28 and a lactic acid greater than 9. Sepsis protocol was begun and the patient was placed on BiPAP and we were asked to admit. Review of Systems: unable to review all systems due to the inability of the patient to answer questions. Past Medical History:  Diagnosis Date   Arthritis    Glaucoma of both eyes    Hyperlipidemia    Hypertension    Pelvic prolapse    anterior floor   Urgency of urination    Urinary hesitancy    Urticaria    Past Surgical History:  Procedure Laterality Date   PUBOVAGINAL SLING N/A 08/31/2012   Procedure: BOSTON SCIENTIFIC UPHOLD LIGHT [place];  Surgeon: Ailene Rud, MD;  Location: [facility];  Service: Urology;  Laterality: N/A;   RETINAL DETACHMENT SURGERY Right    RIGHT/LEFT HEART CATH AND CORONARY ANGIOGRAPHY N/A 01/15/2022   Procedure: RIGHT/LEFT HEART CATH AND CORONARY ANGIOGRAPHY;  Surgeon: Early Osmond, MD;  Location: [place] [facility];  Service: Cardiovascular;  Laterality: N/A;   TEE WITHOUT CARDIOVERSION N/A 09/05/2021   Procedure: TRANSESOPHAGEAL ECHOCARDIOGRAM (TEE);  Surgeon: Sanda Klein, MD;  Location: [facility] ENDOSCOPY;  Service: Cardiovascular;  Laterality: N/A;    Social History:  reports that she has never smoked. She has never used smokeless tobacco. She reports that she does not currently use alcohol. She reports that she does not use drugs.  Allergies  Allergen Reactions   Cozaar [Losartan] Swelling    Tongue swelling   Erythromycin Itching   Azopt [Brinzolamide] Rash    Rash on eyelid    Family History  Problem Relation Age of Onset   Hypertension Mother    Thyroid disease Neg Hx    Diabetes Neg Hx     Prior to Admission medications   Medication Sig Start Date End Date Taking? Authorizing Provider  alendronate (FOSAMAX) 70 MG tablet Take 70 mg by mouth every Wednesday. 08/10/14   [provider]  apixaban (ELIQUIS) 2.5 MG TABS tablet Take 1 tablet (2.5 mg total) by mouth 2 (two) times daily. 07/31/21   Georgette Shell, MD  Ascorbic Acid (VITAMIN C PO) Take 1 tablet by mouth every morning.    [provider]  atorvastatin (LIPITOR) 20 MG tablet Take 20 mg by mouth daily. 12/14/21   [provider]  CALCIUM PO Take 1 tablet by mouth daily with supper.    [provider]  cetirizine (ZYRTEC) 10 MG tablet Take 1 tablet (10 mg total) by mouth 2 (two) times daily. 10/28/19   Kennith Gain, MD  Cholecalciferol (VITAMIN D3 PO) Take 1,000 Units by mouth daily with supper.    [provider]  diltiazem (  CARDIZEM CD) 180 MG 24 hr capsule Take 1 capsule (180 mg total) by mouth daily. 08/01/21   Georgette Shell, MD  EPINEPHrine (EPIPEN) 0.3 mg/0.3 mL IJ SOAJ injection Inject 0.3 mg into the muscle once as needed for anaphylaxis. 09/06/21   Hongalgi, Lenis Dickinson, MD  famotidine (PEPCID) 20 MG tablet Take 1 tablet (20 mg total) by mouth 2 (two) times daily. 10/28/19   Kennith Gain, MD  furosemide (LASIX) 40 MG tablet Take 1 tablet (40 mg total) by mouth 2 (two) times daily. 01/15/22   Raiford Noble Latif, DO  lactose free nutrition (BOOST) LIQD Take 237 mLs by mouth 3 (three) times  daily between meals.    [provider]  latanoprost (XALATAN) 0.005 % ophthalmic solution Place 1 drop into the left eye at bedtime. 09/14/19   [provider]  leflunomide (ARAVA) 10 MG tablet Take 10 mg by mouth every morning. 07/24/21   [provider]  methimazole (TAPAZOLE) 10 MG tablet Take 1 tablet (10 mg total) by mouth 2 (two) times daily. 07/31/21   Georgette Shell, MD  metoprolol succinate (TOPROL-XL) 100 MG 24 hr tablet Take 1.5 tablets (150 mg total) by mouth every morning. 09/07/21   Hongalgi, Lenis Dickinson, MD  montelukast (SINGULAIR) 10 MG tablet Take 1 tablet (10 mg total) by mouth daily. 12/06/19   Kennith Gain, MD  Multiple Vitamin (MULTIVITAMIN WITH MINERALS) TABS tablet Take 1 tablet by mouth every morning.    [provider]  Omega-3 Fatty Acids (FISH OIL) 1000 MG CAPS Take 1,000 mg by mouth every morning.    [provider]  predniSONE (DELTASONE) 2.5 MG tablet Take 1 tablet (2.5 mg total) by mouth daily with breakfast. 08/01/21   Georgette Shell, MD  Probiotic Product (PROBIOTIC DAILY PO) Take 1 capsule by mouth daily with supper.    [provider]  spironolactone (ALDACTONE) 25 MG tablet Take 0.5 tablets (12.5 mg total) by mouth daily. 01/16/22   Sheikh, Omair Latif, DO  timolol (TIMOPTIC) 0.5 % ophthalmic solution Place 1 drop into both eyes 2 (two) times daily.    [provider]    Physical Exam: Vitals:   May 19, 2022 1530 05/19/22 1545 05/19/22 1600 May 19, 2022 1615  BP:  128/84 120/68 106/61  Pulse: 62 (!) 29    Resp: (!) 32 (!) 25 (!) 26 (!) 25  SpO2:  (!) 63%     Physical Examination: General appearance - in mild to moderate distress, ill-appearing, and chronically ill appearing Chest - clear to auscultation, no wheezes, rales or rhonchi, symmetric air entry Heart -markedly bradycardic Abdomen - soft, nontender, nondistended, no masses or organomegaly  Data Reviewed: Results for orders  placed or performed during the hospital encounter of 05-19-2022 (from the past 24 hour(s))  CBC with Differential/Platelet     Status: Abnormal   Collection Time: 05/19/22  3:15 PM  Result Value Ref Range   WBC 16.2 (H) 4.0 - 10.5 K/uL   RBC 3.79 (L) 3.87 - 5.11 MIL/uL   Hemoglobin 12.6 12.0 - 15.0 g/dL   HCT 40.4 36.0 - 46.0 %   MCV 106.6 (H) 80.0 - 100.0 fL   MCH 33.2 26.0 - 34.0 pg   MCHC 31.2 30.0 - 36.0 g/dL   RDW 16.8 (H) 11.5 - 15.5 %   Platelets 167 150 - 400 K/uL   nRBC 0.5 (H) 0.0 - 0.2 %   Neutrophils Relative % 69 %   Neutro Abs 11.3 (H)  1.7 - 7.7 K/uL   Lymphocytes Relative 20 %   Lymphs Abs 3.2 0.7 - 4.0 K/uL   Monocytes Relative 6 %   Monocytes Absolute 0.9 0.1 - 1.0 K/uL   Eosinophils Relative 1 %   Eosinophils Absolute 0.1 0.0 - 0.5 K/uL   Basophils Relative 0 %   Basophils Absolute 0.1 0.0 - 0.1 K/uL   Immature Granulocytes 4 %   Abs Immature Granulocytes 0.62 (H) 0.00 - 0.07 K/uL  Brain natriuretic peptide     Status: Abnormal   Collection Time: 05/20/22  3:15 PM  Result Value Ref Range   B Natriuretic Peptide 1,473.6 (H) 0.0 - 100.0 pg/mL  Comprehensive metabolic panel     Status: Abnormal   Collection Time: 05/20/2022  3:15 PM  Result Value Ref Range   Sodium 125 (L) 135 - 145 mmol/L   Potassium 5.7 (H) 3.5 - 5.1 mmol/L   Chloride 90 (L) 98 - 111 mmol/L   CO2 7 (L) 22 - 32 mmol/L   Glucose, Bld 166 (H) 70 - 99 mg/dL   BUN 36 (H) 8 - 23 mg/dL   Creatinine, Ser 1.68 (H) 0.44 - 1.00 mg/dL   Calcium 9.3 8.9 - 10.3 mg/dL   Total Protein 7.1 6.5 - 8.1 g/dL   Albumin 4.1 3.5 - 5.0 g/dL   AST 151 (H) 15 - 41 U/L   ALT 176 (H) 0 - 44 U/L   Alkaline Phosphatase 205 (H) 38 - 126 U/L   Total Bilirubin 2.7 (H) 0.3 - 1.2 mg/dL   GFR, Estimated 29 (L) >60 mL/min   Anion gap 28 (H) 5 - 15  Troponin I (High Sensitivity)     Status: None   Collection Time: 20-May-2022  3:15 PM  Result Value Ref Range   Troponin I (High Sensitivity) 17 <18 ng/L  Lactic acid, plasma      Status: Abnormal   Collection Time: May 20, 2022  3:15 PM  Result Value Ref Range   Lactic Acid, Venous >9.0 (HH) 0.5 - 1.9 mmol/L  Resp panel by RT-PCR (RSV, Flu A&B, Covid) Anterior Nasal Swab     Status: None   Collection Time: 2022/05/20  3:18 PM   Specimen: Anterior Nasal Swab  Result Value Ref Range   SARS Coronavirus 2 by RT PCR NEGATIVE NEGATIVE   Influenza A by PCR NEGATIVE NEGATIVE   Influenza B by PCR NEGATIVE NEGATIVE   Resp Syncytial Virus by PCR NEGATIVE NEGATIVE  Culture, blood (Routine X 2) w Reflex to ID Panel     Status: None (Preliminary result)   Collection Time: 05/20/22  3:23 PM   Specimen: BLOOD LEFT ARM  Result Value Ref Range   Specimen Description      BLOOD LEFT ARM Performed at [facility], [address] [place], [place] [address]    Special Requests      BOTTLES DRAWN AEROBIC AND ANAEROBIC Blood Culture adequate volume Performed at [facility] [address], [place], [place] [address]    Culture PENDING    Report Status PENDING   CBG monitoring, ED     Status: None   Collection Time: 05-20-22  3:38 PM  Result Value Ref Range   Glucose-Capillary 86 70 - 99 mg/dL  Lactic acid, plasma     Status: Abnormal   Collection Time: 2022/05/20  6:10 PM  Result Value Ref Range   Lactic Acid, Venous >9.0 (HH) 0.5 - 1.9 mmol/L  Troponin I (High Sensitivity)  Status: None   Collection Time: 05-18-22  6:11 PM  Result Value Ref Range   Troponin I (High Sensitivity) 16 <18 ng/L   DG Chest Port 1 View  Result Date: 05/18/22 CLINICAL DATA:  Shortness of EXAM: PORTABLE CHEST 1 VIEW COMPARISON:  Radiograph 01/11/2022 FINDINGS: Unchanged cardiomegaly. Skin folds overlying the right upper chest. No focal airspace consolidation. No large effusion or evidence of pneumothorax. Bilateral shoulder degenerative changes. Thoracic spondylosis. No acute osseous abnormality. IMPRESSION: Unchanged cardiomegaly.  No focal airspace consolidation.  Electronically Signed   By: Caprice Renshaw M.D.   On: May 18, 2022 15:37     Assessment and Plan: * Sepsis Severe based on encephalopathy, LA > 9, encephalopathy, RR > 20, WBC > 12, acute respiratory failure. Source suspected pneumonia though chest x-ray is negative.  Given severity of sepsis. long conversation was had with the son around comfort measures versus escalation of care including ICU and pressor support.  He was in agreement this is probably not the best course of action.  He wanted [place] to continue with BiPAP aggressive fluid resuscitation and IV antibiotics for now. Despite aggressive treatment patient ultimately succumbed to this diagnosis.  CKD (chronic kidney disease) stage 3, GFR 30-59 ml/min Creatinine of 1.68 Aggressive fluid resuscitation Avoid nephrotoxic agents  Atrial fibrillation with RVR Continue meds as needed  Elevated LFTs Indicative of sepsis  Acute exacerbation of CHF (congestive heart failure) Diuresis once stable  Pure hypercholesterolemia Continue statin  Hyperthyroidism Continue thyroid replacement  HTN (hypertension) Not needed due to hypotension      Advance Care Planning:   Code Status: Prior DNR  Consults: None  Family Communication: Son by phone  Severity of Illness: The appropriate patient status for this patient is INPATIENT. Inpatient status is judged to be reasonable and necessary in order to provide the required intensity of service to ensure the patient's safety. The patient's presenting symptoms, physical exam findings, and initial radiographic and laboratory data in the context of their chronic comorbidities is felt to place them at high risk for further clinical deterioration. Furthermore, it is not anticipated that the patient will be medically stable for discharge from the hospital within 2 midnights of admission.   * I certify that at the point of admission it is my clinical judgment that the patient will require inpatient  hospital care spanning beyond 2 midnights from the point of admission due to high intensity of service, high risk for further deterioration and high frequency of surveillance required.*  Author: Reva Bores, MD 2022-05-18 6:18 PM  For on call review [email or web address].

## 2022-06-12 NOTE — ED Provider Notes (Signed)
[place] EMERGENCY DEPARTMENT AT [facility] Provider Note   CSN: [id number] Arrival date & time: 06-11-2022  1453     History  Chief Complaint  Patient presents with   Shortness of Breath    Emily Robertson is a 89 y.o. female.  This is a 89 year old female who presents with shortness of breath from the nursing facility.  Patient has a valid DNR from the facility.  Apparently has been short of breath for several days.  Had chest x-ray done and those results were reviewed.  She is on Z-Pak.  Patient was noted to be cyanotic and mottled and was transported here.  No further history obtainable due to her current state.  She is on Eliquis       Home Medications Prior to Admission medications   Medication Sig Start Date End Date Taking? Authorizing Provider  alendronate (FOSAMAX) 70 MG tablet Take 70 mg by mouth every Wednesday. 08/10/14   [provider]  apixaban (ELIQUIS) 2.5 MG TABS tablet Take 1 tablet (2.5 mg total) by mouth 2 (two) times daily. 07/31/21   Georgette Shell, MD  Ascorbic Acid (VITAMIN C PO) Take 1 tablet by mouth every morning.    [provider]  atorvastatin (LIPITOR) 20 MG tablet Take 20 mg by mouth daily. 12/14/21   [provider]  CALCIUM PO Take 1 tablet by mouth daily with supper.    [provider]  cetirizine (ZYRTEC) 10 MG tablet Take 1 tablet (10 mg total) by mouth 2 (two) times daily. 10/28/19   Kennith Gain, MD  Cholecalciferol (VITAMIN D3 PO) Take 1,000 Units by mouth daily with supper.    [provider]  diltiazem (CARDIZEM CD) 180 MG 24 hr capsule Take 1 capsule (180 mg total) by mouth daily. 08/01/21   Georgette Shell, MD  EPINEPHrine (EPIPEN) 0.3 mg/0.3 mL IJ SOAJ injection Inject 0.3 mg into the muscle once as needed for anaphylaxis. 09/06/21   Hongalgi, Lenis Dickinson, MD  famotidine (PEPCID) 20 MG tablet Take 1 tablet (20 mg total) by mouth 2 (two) times daily. 10/28/19    Kennith Gain, MD  furosemide (LASIX) 40 MG tablet Take 1 tablet (40 mg total) by mouth 2 (two) times daily. 01/15/22   Raiford Noble Latif, DO  lactose free nutrition (BOOST) LIQD Take 237 mLs by mouth 3 (three) times daily between meals.    [provider]  latanoprost (XALATAN) 0.005 % ophthalmic solution Place 1 drop into the left eye at bedtime. 09/14/19   [provider]  leflunomide (ARAVA) 10 MG tablet Take 10 mg by mouth every morning. 07/24/21   [provider]  methimazole (TAPAZOLE) 10 MG tablet Take 1 tablet (10 mg total) by mouth 2 (two) times daily. 07/31/21   Georgette Shell, MD  metoprolol succinate (TOPROL-XL) 100 MG 24 hr tablet Take 1.5 tablets (150 mg total) by mouth every morning. 09/07/21   Hongalgi, Lenis Dickinson, MD  montelukast (SINGULAIR) 10 MG tablet Take 1 tablet (10 mg total) by mouth daily. 12/06/19   Kennith Gain, MD  Multiple Vitamin (MULTIVITAMIN WITH MINERALS) TABS tablet Take 1 tablet by mouth every morning.    [provider]  Omega-3 Fatty Acids (FISH OIL) 1000 MG CAPS Take 1,000 mg by mouth every morning.    [provider]  predniSONE (DELTASONE) 2.5 MG tablet Take 1 tablet (2.5 mg total) by mouth daily with breakfast. 08/01/21   Georgette Shell, MD  Probiotic Product (PROBIOTIC DAILY PO) Take 1 capsule by mouth daily with supper.    [provider]  spironolactone (ALDACTONE) 25 MG tablet Take 0.5 tablets (12.5 mg total) by mouth daily. 01/16/22   Sheikh, Omair Latif, DO  timolol (TIMOPTIC) 0.5 % ophthalmic solution Place 1 drop into both eyes 2 (two) times daily.    [provider]      Allergies    Cozaar [losartan], Erythromycin, and Azopt [brinzolamide]    Review of Systems   Review of Systems  Unable to perform ROS: Acuity of condition    Physical Exam Updated Vital Signs BP (!) 80/46   Pulse 82   Resp 19   SpO2 93%  Physical Exam Vitals and nursing note  reviewed.  Constitutional:      General: She is not in acute distress.    Appearance: Normal appearance. She is well-developed. She is not toxic-appearing.  HENT:     Head: Normocephalic and atraumatic.  Eyes:     General: Lids are normal.     Conjunctiva/sclera: Conjunctivae normal.     Pupils: Pupils are equal, round, and reactive to light.  Neck:     Thyroid: No thyroid mass.     Trachea: No tracheal deviation.  Cardiovascular:     Rate and Rhythm: Normal rate and regular rhythm.     Heart sounds: Normal heart sounds. No murmur heard.    No gallop.  Pulmonary:     Effort: Tachypnea, prolonged expiration and respiratory distress present.     Breath sounds: Decreased breath sounds and rhonchi present. No wheezing or rales.  Abdominal:     General: There is no distension.     Palpations: Abdomen is soft.     Tenderness: There is no abdominal tenderness. There is no rebound.  Musculoskeletal:        General: No tenderness. Normal range of motion.     Cervical back: Normal range of motion and neck supple.  Skin:    General: Skin is warm and dry.     Findings: No abrasion or rash.  Neurological:     Mental Status: She is lethargic and disoriented.     GCS: GCS eye subscore is 3. GCS verbal subscore is 3. GCS motor subscore is 4.     Cranial Nerves: No cranial nerve deficit.     Sensory: No sensory deficit.  Psychiatric:        Attention and Perception: Attention normal.        Speech: Speech normal.        Behavior: Behavior normal.     ED Results / Procedures / Treatments   Labs (all labs ordered are listed, but only abnormal results are displayed) Labs Reviewed  CULTURE, BLOOD (ROUTINE X 2)  CULTURE, BLOOD (ROUTINE X 2)  RESP PANEL BY RT-PCR (RSV, FLU A&B, COVID)  [id number]  CBC WITH DIFFERENTIAL/PLATELET  BRAIN NATRIURETIC PEPTIDE  COMPREHENSIVE METABOLIC PANEL  LACTIC ACID, PLASMA  LACTIC ACID, PLASMA  TROPONIN I (HIGH SENSITIVITY)    EKG None  Radiology No  results found.  Procedures Procedures    Medications Ordered in ED Medications - No data to display  ED Course/ Medical Decision Making/ A&P                             Medical Decision Making Amount and/or Complexity of Data Reviewed Labs: ordered. Radiology: ordered. ECG/medicine tests: ordered.  Risk Prescription drug management.  Patient here on nonrebreather via EMS due to increased shortness of breath.  Patient placed on BiPAP and is mentating more appropriate at this time.  Patient found to be hypothermic here and sepsis protocol started.  She is also placed on a Retail banker.  IV antibiotics and fluids started.  Her chest x-ray per my interpretation shows no acute infiltrate.  Patient's COVID and flu are negative here.  Patient's laboratory studies significant for a lactate greater than 9.  Electrolytes show severe metabolic acidosis.  Also has evidence of acute kidney injury as well.  She also has mild hyperkalemia as well as hyponatremia.  Patient is acutely and severely ill at this time.  Discussed with family at bedside who confirms that patient is a DNR/DNI.  Plan will be for admission to stepdown.  Will consult hospitalist team  CRITICAL CARE Performed by: Leota Jacobsen Total critical care time: 60 minutes Critical care time was exclusive of separately billable procedures and treating other patients. Critical care was necessary to treat or prevent imminent or life-threatening deterioration. Critical care was time spent personally by me on the following activities: development of treatment plan with patient and/or surrogate as well as nursing, discussions with consultants, evaluation of patient's response to treatment, examination of patient, obtaining history from patient or surrogate, ordering and performing treatments and interventions, ordering and review of laboratory studies, ordering and review of radiographic studies, pulse oximetry and re-evaluation of patient's  condition.         Final Clinical Impression(s) / ED Diagnoses Final diagnoses:  None    Rx / DC Orders ED Discharge Orders     None         Lacretia Leigh, MD 05/20/2022 1722

## 2022-06-12 NOTE — Hospital Course (Signed)
Patient is a 89 year old female with past medical history significant for severe mitral valve regurg which is inoperable, HTN, HLD, with CHF related to the mitral valve.  The patient had increasing shortness of breath and work of breathing on yesterday at her SNF and was started on azithromycin and had a chest x-ray done. Patient was seen in the ED where she was found to have multisystem organ failure and significant acidosis with a bicarb of 7 and anion gap of 28 and a lactic acid greater than 9. Sepsis protocol was begun and the patient was placed on BiPAP and we were asked to admit.

## 2022-06-12 NOTE — Assessment & Plan Note (Signed)
takes Prednisone

## 2022-06-12 NOTE — Sepsis Progress Note (Signed)
Notified provider of need to order repeat lactic acid, still >9.

## 2022-06-12 NOTE — ED Notes (Signed)
This nurse assigned to this patient due to shift change. Patient expired at 18:39. Dr. [name] notified patients family. [id number]

## 2022-06-12 NOTE — Sepsis Progress Note (Signed)
Elink monitoring for the code sepsis protocol.  

## 2022-06-12 NOTE — Assessment & Plan Note (Signed)
on Pepcid, Hgb 13.8 03/26/22

## 2022-06-12 NOTE — ED Triage Notes (Signed)
Pt BIBA from [facility] for [facility] through the weekend, facility placed on O2. This morning NP evaluated, ordered CXR and gave 1 dose z pack. Noticed cyanosis, mottling. Pt in visible distress on arrival, on NRB, 20gs LF. 1 duoneb en route. Pt altered. Exp wheezes.   130/70 HR 50s hx afib RR 30 etCO2 12 CBG 161

## 2022-06-12 NOTE — Assessment & Plan Note (Addendum)
Severe based on encephalopathy, LA > 9, encephalopathy, RR > 20, WBC > 12, acute respiratory failure. Source suspected pneumonia though chest x-ray is negative.  Given severity of sepsis. long conversation was had with the son around comfort measures versus escalation of care including ICU and pressor support.  He was in agreement this is probably not the best course of action.  He wanted [place] to continue with BiPAP aggressive fluid resuscitation and IV antibiotics for now. Despite aggressive treatment patient ultimately succumbed to this diagnosis.

## 2022-06-12 NOTE — Code Documentation (Signed)
Pt noted to have episode of asystole. MD to bedside. No pulses palpable.

## 2022-06-12 NOTE — Progress Notes (Addendum)
Location:  [facility] Room Number: [phone] Place of Service:  ALF [id number]) Provider:  Sweet Jarvis X Chayce Robbins,NP  [name], FNP  Patient Care Team: [name], FNP as PCP - General (Family Medicine) Stanford Breed Denice Bors, MD as PCP - Cardiology (Cardiology) Elayne Snare, MD as Consulting Physician (Endocrinology)  Extended Emergency Contact Information Primary Emergency Contact: [facility] Address: [address]          [place], [place] [address] Johnnette Litter of [place] Mobile Phone: [phone] Relation: Son  Code Status:  DNR Goals of care: Advanced Directive information    06/07/22   11:47 AM  Advanced Directives  Does Patient Have a Medical Advance Directive? Yes  Type of Advance Directive Out of facility DNR (pink MOST or yellow form)     Chief Complaint  Patient presents with   Acute Visit    Patient is having some SOB   Immunizations    Discussed the need for Shingles vaccine     HPI:  Pt is a 89 y.o. female seen today for an acute visit for c/o SOB, central congestion, negative COVID, denied cough, chest pain, palpitation, or fever.   The left lateral malleolus pressure ulcer. X-ray 05/08/21 no acute osseous abnormality, last seen by Bazine 05/09/22                CHF, MVP, 01/11/22 Echo EF 60-65%,  on Furosemide, Spironolactone             HTN, controlled             Afib, on Eliquis, Metoprolol, Diltiazem             RA, takes Prednisone             HLD, on Atorvastatin             OP on Alendronate, Ca, Vit D, 03/21/22 DEXA t score 0.995             Allergic rhinitis, takes Zyrtec, Singulair             GERD, on Pepcid, Hgb 13.8 03/26/22             Hyperthyroidism, 03/26/22 reduced Methimazole, TSH 0.020 07/26/21<<24 03/26/22             Glaucoma, eye drops               Hyponatremia, Na 135 03/26/22             CKD, Bun/creat 30/1.27 03/26/22   Past Medical History:  Diagnosis Date   Arthritis    Glaucoma of both eyes     Hyperlipidemia    Hypertension    Pelvic prolapse    anterior floor   Urgency of urination    Urinary hesitancy    Urticaria    Past Surgical History:  Procedure Laterality Date   PUBOVAGINAL SLING N/A 08/31/2012   Procedure: BOSTON SCIENTIFIC UPHOLD LIGHT [place];  Surgeon: Ailene Rud, MD;  Location: [facility];  Service: Urology;  Laterality: N/A;   RETINAL DETACHMENT SURGERY Right    RIGHT/LEFT HEART CATH AND CORONARY ANGIOGRAPHY N/A 01/15/2022   Procedure: RIGHT/LEFT HEART CATH AND CORONARY ANGIOGRAPHY;  Surgeon: Early Osmond, MD;  Location: [facility];  Service: Cardiovascular;  Laterality: N/A;   TEE WITHOUT CARDIOVERSION N/A 09/05/2021   Procedure: TRANSESOPHAGEAL ECHOCARDIOGRAM (TEE);  Surgeon: Sanda Klein, MD;  Location: [facility];  Service: Cardiovascular;  Laterality:  N/A;    Allergies  Allergen Reactions   Cozaar [Losartan] Swelling    Tongue swelling   Erythromycin Itching   Azopt [Brinzolamide] Rash    Rash on eyelid    Outpatient Encounter Medications as of 05-25-2022  Medication Sig   alendronate (FOSAMAX) 70 MG tablet Take 70 mg by mouth every Wednesday.   apixaban (ELIQUIS) 2.5 MG TABS tablet Take 1 tablet (2.5 mg total) by mouth 2 (two) times daily.   Ascorbic Acid (VITAMIN C PO) Take 1 tablet by mouth every morning.   atorvastatin (LIPITOR) 20 MG tablet Take 20 mg by mouth daily.   CALCIUM PO Take 1 tablet by mouth daily with supper.   cetirizine (ZYRTEC) 10 MG tablet Take 1 tablet (10 mg total) by mouth 2 (two) times daily.   Cholecalciferol (VITAMIN D3 PO) Take 1,000 Units by mouth daily with supper.   diltiazem (CARDIZEM CD) 180 MG 24 hr capsule Take 1 capsule (180 mg total) by mouth daily.   EPINEPHrine (EPIPEN) 0.3 mg/0.3 mL IJ SOAJ injection Inject 0.3 mg into the muscle once as needed for anaphylaxis.   famotidine (PEPCID) 20 MG tablet Take 1 tablet (20 mg total) by mouth 2 (two) times daily.    furosemide (LASIX) 40 MG tablet Take 1 tablet (40 mg total) by mouth 2 (two) times daily.   lactose free nutrition (BOOST) LIQD Take 237 mLs by mouth 3 (three) times daily between meals.   latanoprost (XALATAN) 0.005 % ophthalmic solution Place 1 drop into the left eye at bedtime.   leflunomide (ARAVA) 10 MG tablet Take 10 mg by mouth every morning.   methimazole (TAPAZOLE) 10 MG tablet Take 1 tablet (10 mg total) by mouth 2 (two) times daily.   metoprolol succinate (TOPROL-XL) 100 MG 24 hr tablet Take 1.5 tablets (150 mg total) by mouth every morning.   montelukast (SINGULAIR) 10 MG tablet Take 1 tablet (10 mg total) by mouth daily.   Multiple Vitamin (MULTIVITAMIN WITH MINERALS) TABS tablet Take 1 tablet by mouth every morning.   Omega-3 Fatty Acids (FISH OIL) 1000 MG CAPS Take 1,000 mg by mouth every morning.   predniSONE (DELTASONE) 2.5 MG tablet Take 1 tablet (2.5 mg total) by mouth daily with breakfast.   Probiotic Product (PROBIOTIC DAILY PO) Take 1 capsule by mouth daily with supper.   spironolactone (ALDACTONE) 25 MG tablet Take 0.5 tablets (12.5 mg total) by mouth daily.   timolol (TIMOPTIC) 0.5 % ophthalmic solution Place 1 drop into both eyes 2 (two) times daily.   No facility-administered encounter medications on file as of 25-May-2022.    Review of Systems  Constitutional:  Positive for fatigue. Negative for appetite change and fever.  HENT:  Positive for hearing loss. Negative for congestion and trouble swallowing.   Eyes:  Negative for visual disturbance.  Respiratory:  Positive for shortness of breath. Negative for cough and wheezing.        DOE  Cardiovascular:  Negative for chest pain, palpitations and leg swelling.  Gastrointestinal:  Negative for abdominal pain and constipation.  Genitourinary:  Negative for dysuria and urgency.  Musculoskeletal:  Positive for arthralgias and gait problem.  Skin:  Positive for wound.  Neurological:  Negative for weakness and headaches.   Psychiatric/Behavioral:  Negative for behavioral problems and sleep disturbance. The patient is not nervous/anxious.     Immunization History  Administered Date(s) Administered   PFIZER(Purple Top)SARS-COV-2 Vaccination 04/15/2019, 05/12/2019, 11/27/2019   PNEUMOCOCCAL CONJUGATE-20 03/28/2022   Tdap 03/28/2022  Pertinent  Health Maintenance Due  Topic Date Due   INFLUENZA VACCINE  09/12/2022   DEXA SCAN  Completed      01/14/2022    7:48 AM 01/14/2022    9:05 PM 01/15/2022    7:45 AM 01/15/2022    9:01 PM 01/16/2022   10:50 AM  Fall Risk  (RETIRED) Patient Fall Risk Level High fall risk High fall risk High fall risk High fall risk High fall risk   Functional Status Survey:    Vitals:   June 07, 2022 1132  BP: 118/80  Pulse: 96  Resp: 18  Temp: (!) 96.6 F (35.9 C)  TempSrc: Temporal  SpO2: (!) 78%  Weight: 113 lb 3.2 oz (51.3 kg)  Height: 5\' 3"  (1.6 m)   Body mass index is 20.05 kg/m. Physical Exam Vitals and nursing note reviewed.  Constitutional:      Appearance: She is ill-appearing.  HENT:     Head: Normocephalic and atraumatic.     Nose: Nose normal.     Mouth/Throat:     Mouth: Mucous membranes are dry.  Eyes:     Extraocular Movements: Extraocular movements intact.     Conjunctiva/sclera: Conjunctivae normal.     Pupils: Pupils are equal, round, and reactive to light.  Cardiovascular:     Rate and Rhythm: Normal rate. Rhythm irregular.     Heart sounds: Murmur heard.     Comments: DP pulses not felt R+L Pulmonary:     Effort: Pulmonary effort is normal.     Breath sounds: Rhonchi present. No wheezing or rales.     Comments: Central congestion, rhonchi Abdominal:     General: Bowel sounds are normal.     Palpations: Abdomen is soft.     Tenderness: There is no abdominal tenderness.  Musculoskeletal:     Cervical back: Normal range of motion and neck supple.     Right lower leg: No edema.     Left lower leg: Edema present.     Comments: Trace edema  BLE  Skin:    General: Skin is warm and dry.     Findings: Erythema present.     Comments: Lateral left malleolus open area a quarter sized, slightly erythema at wound margin, no odorous drainage, yellow slough in wound bed.   Neurological:     General: No focal deficit present.     Mental Status: She is alert and oriented to person, place, and time. Mental status is at baseline.     Gait: Gait abnormal.  Psychiatric:        Mood and Affect: Mood normal.        Behavior: Behavior normal.        Thought Content: Thought content normal.     Labs reviewed: Recent Labs    01/13/22 0909 01/14/22 0149 01/15/22 0442 01/15/22 1527 01/15/22 1533 01/15/22 1538  NA 135 130* 131* 131* 117* 131*  K 3.6 3.5 3.2* 4.2 3.3* 4.3  CL 92* 92* 93*  --   --   --   CO2 33* 29 27  --   --   --   GLUCOSE 105* 115* 109*  --   --   --   BUN 36* 41* 38*  --   --   --   CREATININE 1.14* 1.10* 1.00  --   --   --   CALCIUM 8.9 8.9 9.1  --   --   --   MG 2.3 2.1 2.0  --   --   --  PHOS 2.6 2.5 2.6  --   --   --    Recent Labs    01/13/22 0909 01/14/22 0149 01/15/22 0442  AST 64* 49* 38  ALT 119* 95* 80*  ALKPHOS 194* 174* 160*  BILITOT 1.2 1.2 1.0  PROT 6.2* 6.2* 6.4*  ALBUMIN 3.6 3.4* 3.3*   Recent Labs    01/13/22 0909 01/14/22 0149 01/15/22 0442 01/15/22 1527 01/15/22 1533 01/15/22 1538  WBC 10.9* 11.6* 11.1*  --   --   --   NEUTROABS 8.1* 8.5* 7.4  --   --   --   HGB 13.2 12.8 14.5 15.3* 13.9 16.0*  HCT 40.0 38.3 43.3 45.0 41.0 47.0*  MCV 99.8 98.2 98.2  --   --   --   PLT 194 185 231  --   --   --    Lab Results  Component Value Date   TSH 33.23 (A) 05/07/2022   Lab Results  Component Value Date   HGBA1C 5.7 (H) 09/03/2021   Lab Results  Component Value Date   CHOL 144 07/26/2021   HDL 52 07/26/2021   LDLCALC 82 07/26/2021   TRIG 52 07/26/2021   CHOLHDL 2.8 07/26/2021    Significant Diagnostic Results in last 30 days:  No results  found.  Assessment/Plan Dyspnea  SOB, central congestion, negative COVID, denied cough, chest pain, palpitation, or fever. Will obtain CXR ap/lateral, CBC/diff, CMP/eGFR, DuoNeb, may consider empirical ABT if wbc is elevated.   Hyperthyroidism Reduced Methimazole 5mg  q8hrs from 10mg  bid 03/26/22, TSH 0.020 07/26/21<<24 03/26/22, repeat TSH 6 weeks from 03/26/22   CKD (chronic kidney disease) stage 3, GFR 30-59 ml/min (HCC) Bun/creat 30/1.27 03/26/22  GERD (gastroesophageal reflux disease) on Pepcid, Hgb 13.8 03/26/22  Rheumatoid arthritis (HCC) takes Prednisone  Permanent atrial fibrillation (HCC) Heart rate is in control,  on Eliquis, Metoprolol, Diltiazem  CHF (congestive heart failure) (HCC) MVP, 01/11/22 Echo EF 60-65%,  on Furosemide, Spironolactone  HTN (hypertension) Blood pressure is controlled    Family/ staff Communication: plan of care reviewed with the patient and charge nurse.   Labs/tests ordered: CBC/diff, CMP/eGFR, CXR ap/lateral  Time spend 40 minutes.

## 2022-06-12 NOTE — Assessment & Plan Note (Addendum)
Heart rate is in control,  on Eliquis, Metoprolol, Diltiazem

## 2022-06-12 NOTE — Assessment & Plan Note (Addendum)
Creatinine of 1.68 Aggressive fluid resuscitation Avoid nephrotoxic agents

## 2022-06-12 DEATH — deceased

## 2022-07-09 ENCOUNTER — Ambulatory Visit: Payer: Medicare PPO | Admitting: Cardiology
